# Patient Record
Sex: Male | Born: 1970 | Race: White | Hispanic: No | Marital: Single | Smoking: Current every day smoker
Health system: Southern US, Community
[De-identification: ages and names within clinical notes are randomized; demographics above are authoritative.]

## PROBLEM LIST (undated history)

## (undated) VITALS — BP 148/87 | HR 89 | Temp 97.8°F | Resp 16 | Ht 68.75 in | Wt 184.0 lb

## (undated) DIAGNOSIS — F1410 Cocaine abuse, uncomplicated: Secondary | ICD-10-CM

## (undated) DIAGNOSIS — I219 Acute myocardial infarction, unspecified: Secondary | ICD-10-CM

## (undated) DIAGNOSIS — I2510 Atherosclerotic heart disease of native coronary artery without angina pectoris: Secondary | ICD-10-CM

## (undated) DIAGNOSIS — I10 Essential (primary) hypertension: Secondary | ICD-10-CM

## (undated) DIAGNOSIS — F1010 Alcohol abuse, uncomplicated: Secondary | ICD-10-CM

## (undated) DIAGNOSIS — Z951 Presence of aortocoronary bypass graft: Secondary | ICD-10-CM

## (undated) DIAGNOSIS — I639 Cerebral infarction, unspecified: Secondary | ICD-10-CM

## (undated) DIAGNOSIS — F1910 Other psychoactive substance abuse, uncomplicated: Secondary | ICD-10-CM

## (undated) DIAGNOSIS — J449 Chronic obstructive pulmonary disease, unspecified: Secondary | ICD-10-CM

## (undated) DIAGNOSIS — F17200 Nicotine dependence, unspecified, uncomplicated: Secondary | ICD-10-CM

## (undated) DIAGNOSIS — J45909 Unspecified asthma, uncomplicated: Secondary | ICD-10-CM

## (undated) DIAGNOSIS — F319 Bipolar disorder, unspecified: Secondary | ICD-10-CM

## (undated) DIAGNOSIS — N200 Calculus of kidney: Secondary | ICD-10-CM

## (undated) DIAGNOSIS — R569 Unspecified convulsions: Secondary | ICD-10-CM

## (undated) DIAGNOSIS — E7800 Pure hypercholesterolemia, unspecified: Secondary | ICD-10-CM

## (undated) HISTORY — PX: APPENDECTOMY: SHX54

## (undated) HISTORY — PX: CORONARY ARTERY BYPASS GRAFT: SHX141

## (undated) HISTORY — PX: PELVIC FRACTURE SURGERY: SHX119

## (undated) HISTORY — PX: CARDIAC SURGERY: SHX584

## (undated) HISTORY — PX: KIDNEY STONE SURGERY: SHX686

---

## 2000-09-12 ENCOUNTER — Inpatient Hospital Stay (HOSPITAL_COMMUNITY): Admission: EM | Admit: 2000-09-12 | Discharge: 2000-09-17 | Payer: Self-pay | Admitting: *Deleted

## 2000-10-17 ENCOUNTER — Encounter: Payer: Self-pay | Admitting: Emergency Medicine

## 2000-10-17 ENCOUNTER — Emergency Department (HOSPITAL_COMMUNITY): Admission: EM | Admit: 2000-10-17 | Discharge: 2000-10-17 | Payer: Self-pay | Admitting: Emergency Medicine

## 2000-10-21 ENCOUNTER — Encounter: Payer: Self-pay | Admitting: Emergency Medicine

## 2000-10-21 ENCOUNTER — Ambulatory Visit (HOSPITAL_COMMUNITY): Admission: EM | Admit: 2000-10-21 | Discharge: 2000-10-21 | Payer: Self-pay | Admitting: *Deleted

## 2000-10-21 ENCOUNTER — Encounter: Payer: Self-pay | Admitting: Urology

## 2000-10-23 ENCOUNTER — Emergency Department (HOSPITAL_COMMUNITY): Admission: EM | Admit: 2000-10-23 | Discharge: 2000-10-24 | Payer: Self-pay | Admitting: Emergency Medicine

## 2000-10-24 ENCOUNTER — Encounter: Payer: Self-pay | Admitting: Emergency Medicine

## 2000-11-01 ENCOUNTER — Ambulatory Visit (HOSPITAL_COMMUNITY): Admission: RE | Admit: 2000-11-01 | Discharge: 2000-11-01 | Payer: Self-pay | Admitting: Orthopedic Surgery

## 2000-11-01 ENCOUNTER — Encounter: Payer: Self-pay | Admitting: Orthopedic Surgery

## 2000-11-05 ENCOUNTER — Emergency Department (HOSPITAL_COMMUNITY): Admission: EM | Admit: 2000-11-05 | Discharge: 2000-11-06 | Payer: Self-pay | Admitting: Emergency Medicine

## 2000-11-06 ENCOUNTER — Encounter: Payer: Self-pay | Admitting: Emergency Medicine

## 2000-12-04 ENCOUNTER — Emergency Department (HOSPITAL_COMMUNITY): Admission: EM | Admit: 2000-12-04 | Discharge: 2000-12-04 | Payer: Self-pay | Admitting: Emergency Medicine

## 2000-12-08 ENCOUNTER — Encounter: Payer: Self-pay | Admitting: Emergency Medicine

## 2000-12-08 ENCOUNTER — Emergency Department (HOSPITAL_COMMUNITY): Admission: EM | Admit: 2000-12-08 | Discharge: 2000-12-08 | Payer: Self-pay | Admitting: Emergency Medicine

## 2000-12-12 ENCOUNTER — Emergency Department (HOSPITAL_COMMUNITY): Admission: EM | Admit: 2000-12-12 | Discharge: 2000-12-13 | Payer: Self-pay | Admitting: Emergency Medicine

## 2000-12-13 ENCOUNTER — Emergency Department (HOSPITAL_COMMUNITY): Admission: EM | Admit: 2000-12-13 | Discharge: 2000-12-13 | Payer: Self-pay

## 2001-01-31 ENCOUNTER — Encounter: Payer: Self-pay | Admitting: Emergency Medicine

## 2001-01-31 ENCOUNTER — Emergency Department (HOSPITAL_COMMUNITY): Admission: EM | Admit: 2001-01-31 | Discharge: 2001-01-31 | Payer: Self-pay | Admitting: Emergency Medicine

## 2001-02-15 ENCOUNTER — Inpatient Hospital Stay (HOSPITAL_COMMUNITY): Admission: EM | Admit: 2001-02-15 | Discharge: 2001-02-20 | Payer: Self-pay | Admitting: Psychiatry

## 2001-03-22 ENCOUNTER — Emergency Department (HOSPITAL_COMMUNITY): Admission: EM | Admit: 2001-03-22 | Discharge: 2001-03-22 | Payer: Self-pay | Admitting: Emergency Medicine

## 2001-03-23 ENCOUNTER — Encounter: Payer: Self-pay | Admitting: Emergency Medicine

## 2001-04-20 ENCOUNTER — Inpatient Hospital Stay (HOSPITAL_COMMUNITY): Admission: EM | Admit: 2001-04-20 | Discharge: 2001-04-29 | Payer: Self-pay | Admitting: *Deleted

## 2001-05-12 ENCOUNTER — Emergency Department (HOSPITAL_COMMUNITY): Admission: EM | Admit: 2001-05-12 | Discharge: 2001-05-12 | Payer: Self-pay

## 2001-07-07 ENCOUNTER — Encounter: Payer: Self-pay | Admitting: Emergency Medicine

## 2001-07-07 ENCOUNTER — Emergency Department (HOSPITAL_COMMUNITY): Admission: EM | Admit: 2001-07-07 | Discharge: 2001-07-07 | Payer: Self-pay

## 2001-09-18 ENCOUNTER — Emergency Department (HOSPITAL_COMMUNITY): Admission: EM | Admit: 2001-09-18 | Discharge: 2001-09-18 | Payer: Self-pay | Admitting: Emergency Medicine

## 2001-11-09 ENCOUNTER — Emergency Department (HOSPITAL_COMMUNITY): Admission: EM | Admit: 2001-11-09 | Discharge: 2001-11-09 | Payer: Self-pay | Admitting: Emergency Medicine

## 2002-03-23 ENCOUNTER — Emergency Department (HOSPITAL_COMMUNITY): Admission: EM | Admit: 2002-03-23 | Discharge: 2002-03-23 | Payer: Self-pay | Admitting: Emergency Medicine

## 2002-03-23 ENCOUNTER — Encounter: Payer: Self-pay | Admitting: Emergency Medicine

## 2002-04-05 ENCOUNTER — Emergency Department (HOSPITAL_COMMUNITY): Admission: EM | Admit: 2002-04-05 | Discharge: 2002-04-05 | Payer: Self-pay | Admitting: Emergency Medicine

## 2002-04-05 ENCOUNTER — Encounter: Payer: Self-pay | Admitting: Emergency Medicine

## 2002-04-07 ENCOUNTER — Encounter: Payer: Self-pay | Admitting: *Deleted

## 2002-04-07 ENCOUNTER — Emergency Department (HOSPITAL_COMMUNITY): Admission: EM | Admit: 2002-04-07 | Discharge: 2002-04-07 | Payer: Self-pay | Admitting: *Deleted

## 2002-06-08 ENCOUNTER — Emergency Department (HOSPITAL_COMMUNITY): Admission: EM | Admit: 2002-06-08 | Discharge: 2002-06-09 | Payer: Self-pay | Admitting: Emergency Medicine

## 2002-06-15 ENCOUNTER — Emergency Department (HOSPITAL_COMMUNITY): Admission: EM | Admit: 2002-06-15 | Discharge: 2002-06-15 | Payer: Self-pay | Admitting: Emergency Medicine

## 2002-06-15 ENCOUNTER — Encounter: Payer: Self-pay | Admitting: Emergency Medicine

## 2002-06-22 ENCOUNTER — Emergency Department (HOSPITAL_COMMUNITY): Admission: EM | Admit: 2002-06-22 | Discharge: 2002-06-22 | Payer: Self-pay | Admitting: Emergency Medicine

## 2002-06-22 ENCOUNTER — Encounter: Payer: Self-pay | Admitting: Emergency Medicine

## 2002-07-28 ENCOUNTER — Emergency Department (HOSPITAL_COMMUNITY): Admission: EM | Admit: 2002-07-28 | Discharge: 2002-07-28 | Payer: Self-pay | Admitting: Emergency Medicine

## 2003-10-15 ENCOUNTER — Emergency Department (HOSPITAL_COMMUNITY): Admission: EM | Admit: 2003-10-15 | Discharge: 2003-10-15 | Payer: Self-pay | Admitting: Emergency Medicine

## 2003-12-22 ENCOUNTER — Inpatient Hospital Stay (HOSPITAL_COMMUNITY): Admission: EM | Admit: 2003-12-22 | Discharge: 2003-12-24 | Payer: Self-pay | Admitting: Emergency Medicine

## 2004-02-13 ENCOUNTER — Emergency Department (HOSPITAL_COMMUNITY): Admission: EM | Admit: 2004-02-13 | Discharge: 2004-02-13 | Payer: Self-pay

## 2004-02-29 ENCOUNTER — Emergency Department (HOSPITAL_COMMUNITY): Admission: EM | Admit: 2004-02-29 | Discharge: 2004-02-29 | Payer: Self-pay | Admitting: Emergency Medicine

## 2004-04-19 ENCOUNTER — Emergency Department (HOSPITAL_COMMUNITY): Admission: EM | Admit: 2004-04-19 | Discharge: 2004-04-19 | Payer: Self-pay | Admitting: Emergency Medicine

## 2004-04-20 ENCOUNTER — Emergency Department (HOSPITAL_COMMUNITY): Admission: EM | Admit: 2004-04-20 | Discharge: 2004-04-20 | Payer: Self-pay | Admitting: Emergency Medicine

## 2004-05-13 ENCOUNTER — Emergency Department (HOSPITAL_COMMUNITY): Admission: EM | Admit: 2004-05-13 | Discharge: 2004-05-13 | Payer: Self-pay | Admitting: Emergency Medicine

## 2004-06-22 ENCOUNTER — Emergency Department (HOSPITAL_COMMUNITY): Admission: EM | Admit: 2004-06-22 | Discharge: 2004-06-22 | Payer: Self-pay | Admitting: Emergency Medicine

## 2004-07-06 ENCOUNTER — Encounter: Admission: RE | Admit: 2004-07-06 | Discharge: 2004-10-04 | Payer: Self-pay | Admitting: Family Medicine

## 2004-11-16 ENCOUNTER — Emergency Department (HOSPITAL_COMMUNITY): Admission: EM | Admit: 2004-11-16 | Discharge: 2004-11-16 | Payer: Self-pay | Admitting: Emergency Medicine

## 2004-11-17 ENCOUNTER — Ambulatory Visit: Payer: Self-pay | Admitting: Nurse Practitioner

## 2004-11-18 ENCOUNTER — Ambulatory Visit: Payer: Self-pay | Admitting: *Deleted

## 2005-04-24 ENCOUNTER — Emergency Department (HOSPITAL_COMMUNITY): Admission: EM | Admit: 2005-04-24 | Discharge: 2005-04-24 | Payer: Self-pay | Admitting: Emergency Medicine

## 2006-03-28 ENCOUNTER — Ambulatory Visit: Payer: Self-pay | Admitting: Nurse Practitioner

## 2006-03-29 ENCOUNTER — Ambulatory Visit (HOSPITAL_COMMUNITY): Admission: RE | Admit: 2006-03-29 | Discharge: 2006-03-29 | Payer: Self-pay | Admitting: Nurse Practitioner

## 2006-03-31 ENCOUNTER — Emergency Department (HOSPITAL_COMMUNITY): Admission: EM | Admit: 2006-03-31 | Discharge: 2006-03-31 | Payer: Self-pay | Admitting: Emergency Medicine

## 2006-04-11 ENCOUNTER — Ambulatory Visit: Payer: Self-pay | Admitting: Nurse Practitioner

## 2006-04-23 ENCOUNTER — Ambulatory Visit: Payer: Self-pay | Admitting: Nurse Practitioner

## 2006-04-26 ENCOUNTER — Ambulatory Visit: Payer: Self-pay | Admitting: Nurse Practitioner

## 2006-06-11 ENCOUNTER — Emergency Department (HOSPITAL_COMMUNITY): Admission: EM | Admit: 2006-06-11 | Discharge: 2006-06-11 | Payer: Self-pay | Admitting: Emergency Medicine

## 2006-06-20 ENCOUNTER — Ambulatory Visit: Payer: Self-pay | Admitting: Nurse Practitioner

## 2006-07-04 ENCOUNTER — Emergency Department (HOSPITAL_COMMUNITY): Admission: EM | Admit: 2006-07-04 | Discharge: 2006-07-04 | Payer: Self-pay | Admitting: Emergency Medicine

## 2006-07-06 ENCOUNTER — Ambulatory Visit: Payer: Self-pay | Admitting: Nurse Practitioner

## 2006-07-25 ENCOUNTER — Ambulatory Visit: Payer: Self-pay | Admitting: Nurse Practitioner

## 2006-08-28 ENCOUNTER — Emergency Department (HOSPITAL_COMMUNITY): Admission: EM | Admit: 2006-08-28 | Discharge: 2006-08-28 | Payer: Self-pay | Admitting: Emergency Medicine

## 2006-09-17 ENCOUNTER — Emergency Department (HOSPITAL_COMMUNITY): Admission: EM | Admit: 2006-09-17 | Discharge: 2006-09-17 | Payer: Self-pay | Admitting: Emergency Medicine

## 2006-09-17 ENCOUNTER — Ambulatory Visit: Payer: Self-pay | Admitting: Nurse Practitioner

## 2006-10-10 ENCOUNTER — Ambulatory Visit: Payer: Self-pay | Admitting: Nurse Practitioner

## 2006-10-19 ENCOUNTER — Ambulatory Visit (HOSPITAL_COMMUNITY): Admission: RE | Admit: 2006-10-19 | Discharge: 2006-10-19 | Payer: Self-pay | Admitting: Nurse Practitioner

## 2006-11-06 ENCOUNTER — Ambulatory Visit: Payer: Self-pay | Admitting: Internal Medicine

## 2007-01-30 ENCOUNTER — Encounter (INDEPENDENT_AMBULATORY_CARE_PROVIDER_SITE_OTHER): Payer: Self-pay | Admitting: *Deleted

## 2007-03-13 DIAGNOSIS — E785 Hyperlipidemia, unspecified: Secondary | ICD-10-CM

## 2007-03-13 DIAGNOSIS — I10 Essential (primary) hypertension: Secondary | ICD-10-CM | POA: Insufficient documentation

## 2007-03-13 DIAGNOSIS — K219 Gastro-esophageal reflux disease without esophagitis: Secondary | ICD-10-CM | POA: Insufficient documentation

## 2008-06-04 ENCOUNTER — Emergency Department (HOSPITAL_COMMUNITY): Admission: EM | Admit: 2008-06-04 | Discharge: 2008-06-05 | Payer: Self-pay | Admitting: Emergency Medicine

## 2008-06-05 ENCOUNTER — Ambulatory Visit: Payer: Self-pay | Admitting: *Deleted

## 2008-06-05 ENCOUNTER — Inpatient Hospital Stay (HOSPITAL_COMMUNITY): Admission: EM | Admit: 2008-06-05 | Discharge: 2008-06-10 | Payer: Self-pay | Admitting: *Deleted

## 2008-06-16 ENCOUNTER — Emergency Department (HOSPITAL_COMMUNITY): Admission: EM | Admit: 2008-06-16 | Discharge: 2008-06-16 | Payer: Self-pay | Admitting: Emergency Medicine

## 2008-06-18 ENCOUNTER — Emergency Department (HOSPITAL_COMMUNITY): Admission: EM | Admit: 2008-06-18 | Discharge: 2008-06-18 | Payer: Self-pay | Admitting: Emergency Medicine

## 2008-07-29 ENCOUNTER — Emergency Department (HOSPITAL_COMMUNITY): Admission: EM | Admit: 2008-07-29 | Discharge: 2008-07-30 | Payer: Self-pay | Admitting: Emergency Medicine

## 2008-08-10 ENCOUNTER — Emergency Department (HOSPITAL_COMMUNITY): Admission: EM | Admit: 2008-08-10 | Discharge: 2008-08-11 | Payer: Self-pay | Admitting: Emergency Medicine

## 2008-09-23 ENCOUNTER — Emergency Department (HOSPITAL_COMMUNITY): Admission: EM | Admit: 2008-09-23 | Discharge: 2008-09-23 | Payer: Self-pay | Admitting: Emergency Medicine

## 2009-01-21 ENCOUNTER — Emergency Department (HOSPITAL_COMMUNITY): Admission: EM | Admit: 2009-01-21 | Discharge: 2009-01-21 | Payer: Self-pay | Admitting: Emergency Medicine

## 2009-01-21 ENCOUNTER — Inpatient Hospital Stay (HOSPITAL_COMMUNITY): Admission: AD | Admit: 2009-01-21 | Discharge: 2009-01-28 | Payer: Self-pay | Admitting: Psychiatry

## 2009-01-21 ENCOUNTER — Ambulatory Visit: Payer: Self-pay | Admitting: Psychiatry

## 2009-01-30 ENCOUNTER — Emergency Department (HOSPITAL_COMMUNITY): Admission: EM | Admit: 2009-01-30 | Discharge: 2009-01-31 | Payer: Self-pay | Admitting: Emergency Medicine

## 2009-04-29 ENCOUNTER — Emergency Department (HOSPITAL_COMMUNITY): Admission: EM | Admit: 2009-04-29 | Discharge: 2009-04-29 | Payer: Self-pay | Admitting: Emergency Medicine

## 2009-06-02 ENCOUNTER — Ambulatory Visit (HOSPITAL_BASED_OUTPATIENT_CLINIC_OR_DEPARTMENT_OTHER): Admission: RE | Admit: 2009-06-02 | Discharge: 2009-06-02 | Payer: Self-pay | Admitting: Plastic Surgery

## 2009-09-14 ENCOUNTER — Emergency Department (HOSPITAL_COMMUNITY): Admission: EM | Admit: 2009-09-14 | Discharge: 2009-09-15 | Payer: Self-pay | Admitting: Emergency Medicine

## 2009-10-04 ENCOUNTER — Ambulatory Visit: Payer: Self-pay | Admitting: Cardiothoracic Surgery

## 2009-10-08 ENCOUNTER — Emergency Department: Payer: Self-pay | Admitting: Emergency Medicine

## 2009-10-15 ENCOUNTER — Ambulatory Visit: Payer: Self-pay | Admitting: Thoracic Surgery (Cardiothoracic Vascular Surgery)

## 2009-11-18 ENCOUNTER — Encounter (INDEPENDENT_AMBULATORY_CARE_PROVIDER_SITE_OTHER): Payer: Self-pay | Admitting: Adult Health

## 2009-11-18 ENCOUNTER — Ambulatory Visit: Payer: Self-pay | Admitting: Internal Medicine

## 2009-11-18 LAB — CONVERTED CEMR LAB
Amphetamine Screen, Ur: NEGATIVE
Barbiturate Quant, Ur: NEGATIVE
Cocaine Metabolites: POSITIVE — AB
Opiate Screen, Urine: POSITIVE — AB
Phencyclidine (PCP): NEGATIVE

## 2009-11-21 ENCOUNTER — Emergency Department (HOSPITAL_COMMUNITY): Admission: EM | Admit: 2009-11-21 | Discharge: 2009-11-22 | Payer: Self-pay | Admitting: Emergency Medicine

## 2009-11-23 ENCOUNTER — Ambulatory Visit: Payer: Self-pay | Admitting: Internal Medicine

## 2009-11-23 ENCOUNTER — Encounter (INDEPENDENT_AMBULATORY_CARE_PROVIDER_SITE_OTHER): Payer: Self-pay | Admitting: Adult Health

## 2009-11-23 LAB — CONVERTED CEMR LAB
Alkaline Phosphatase: 105 units/L (ref 39–117)
Basophils Absolute: 0 10*3/uL (ref 0.0–0.1)
CO2: 21 meq/L (ref 19–32)
Cholesterol: 169 mg/dL (ref 0–200)
Creatinine, Ser: 0.93 mg/dL (ref 0.40–1.50)
Eosinophils Absolute: 0.2 10*3/uL (ref 0.0–0.7)
Eosinophils Relative: 3 % (ref 0–5)
Glucose, Bld: 106 mg/dL — ABNORMAL HIGH (ref 70–99)
HCT: 43.4 % (ref 39.0–52.0)
Hemoglobin: 13.8 g/dL (ref 13.0–17.0)
LDL Cholesterol: 77 mg/dL (ref 0–99)
Lymphocytes Relative: 19 % (ref 12–46)
Lymphs Abs: 1.5 10*3/uL (ref 0.7–4.0)
MCV: 96.9 fL (ref 78.0–100.0)
Monocytes Absolute: 0.5 10*3/uL (ref 0.1–1.0)
Platelets: 489 10*3/uL — ABNORMAL HIGH (ref 150–400)
RDW: 15.8 % — ABNORMAL HIGH (ref 11.5–15.5)
Sodium: 138 meq/L (ref 135–145)
Total Bilirubin: 0.5 mg/dL (ref 0.3–1.2)
Total CHOL/HDL Ratio: 4.8
Total Protein: 7.3 g/dL (ref 6.0–8.3)
Triglycerides: 285 mg/dL — ABNORMAL HIGH (ref <150)
VLDL: 57 mg/dL — ABNORMAL HIGH (ref 0–40)

## 2009-11-25 ENCOUNTER — Encounter (INDEPENDENT_AMBULATORY_CARE_PROVIDER_SITE_OTHER): Payer: Self-pay | Admitting: Adult Health

## 2009-12-09 ENCOUNTER — Encounter (INDEPENDENT_AMBULATORY_CARE_PROVIDER_SITE_OTHER): Payer: Self-pay | Admitting: Adult Health

## 2009-12-09 ENCOUNTER — Ambulatory Visit: Payer: Self-pay | Admitting: Internal Medicine

## 2009-12-09 LAB — CONVERTED CEMR LAB

## 2009-12-13 ENCOUNTER — Ambulatory Visit: Payer: Self-pay | Admitting: Internal Medicine

## 2010-03-07 ENCOUNTER — Inpatient Hospital Stay (HOSPITAL_COMMUNITY): Admission: EM | Admit: 2010-03-07 | Discharge: 2010-03-07 | Payer: Self-pay | Admitting: Emergency Medicine

## 2010-07-27 LAB — COMPREHENSIVE METABOLIC PANEL
ALT: 27 U/L (ref 0–53)
AST: 27 U/L (ref 0–37)
Alkaline Phosphatase: 76 U/L (ref 39–117)
CO2: 24 mEq/L (ref 19–32)
Calcium: 9.1 mg/dL (ref 8.4–10.5)
Chloride: 109 mEq/L (ref 96–112)
GFR calc non Af Amer: >60 mL/min (ref >60)
Glucose, Bld: 113 mg/dL — ABNORMAL HIGH (ref 70–99)
Sodium: 139 mEq/L (ref 135–145)
Total Bilirubin: 0.4 mg/dL (ref 0.3–1.2)

## 2010-07-27 LAB — POCT I-STAT, CHEM 8
BUN: 17 mg/dL (ref 6–23)
Creatinine, Ser: 1.3 mg/dL (ref 0.4–1.5)
Glucose, Bld: 131 mg/dL — ABNORMAL HIGH (ref 70–99)
Hemoglobin: 15.3 g/dL (ref 13.0–17.0)
Potassium: 3.5 mEq/L (ref 3.5–5.1)
TCO2: 26 mmol/L (ref 0–100)

## 2010-07-27 LAB — LIPID PANEL
Cholesterol: 146 mg/dL (ref 0–200)
HDL: 32 mg/dL — ABNORMAL LOW (ref >39)

## 2010-07-27 LAB — D-DIMER, QUANTITATIVE: D-Dimer, Quant: 0.3 ug/mL-FEU (ref 0.00–0.48)

## 2010-07-27 LAB — CK TOTAL AND CKMB (NOT AT ARMC): Relative Index: INVALID (ref 0.0–2.5)

## 2010-07-27 LAB — DIFFERENTIAL
Basophils Relative: 0 % (ref 0–1)
Eosinophils Absolute: 0.6 10*3/uL (ref 0.0–0.7)
Monocytes Absolute: 0.9 10*3/uL (ref 0.1–1.0)
Monocytes Relative: 9 % (ref 3–12)

## 2010-07-27 LAB — LIPASE, BLOOD: Lipase: 51 U/L (ref 11–59)

## 2010-07-27 LAB — RAPID URINE DRUG SCREEN, HOSP PERFORMED
Amphetamines: NOT DETECTED
Cocaine: POSITIVE — AB
Tetrahydrocannabinol: POSITIVE — AB

## 2010-07-27 LAB — CBC
HCT: 42.6 % (ref 39.0–52.0)
Hemoglobin: 14.4 g/dL (ref 13.0–17.0)
MCHC: 33.8 g/dL (ref 30.0–36.0)
MCV: 93.2 fL (ref 78.0–100.0)
Platelets: 283 10*3/uL (ref 150–400)
RBC: 4.57 MIL/uL (ref 4.22–5.81)
RDW: 15.2 % (ref 11.5–15.5)
WBC: 9.5 10*3/uL (ref 4.0–10.5)

## 2010-07-31 LAB — POCT CARDIAC MARKERS
CKMB, poc: 3.4 ng/mL (ref 1.0–8.0)
CKMB, poc: 4.5 ng/mL (ref 1.0–8.0)
Myoglobin, poc: 82.6 ng/mL (ref 12–200)
Troponin i, poc: <0.05 ng/mL (ref 0.00–0.09)

## 2010-07-31 LAB — POCT I-STAT, CHEM 8
Calcium, Ion: 1.1 mmol/L — ABNORMAL LOW (ref 1.12–1.32)
Creatinine, Ser: 1.2 mg/dL (ref 0.4–1.5)
Glucose, Bld: 84 mg/dL (ref 70–99)
HCT: 42 % (ref 39.0–52.0)
Hemoglobin: 14.3 g/dL (ref 13.0–17.0)
Potassium: 3.4 mEq/L — ABNORMAL LOW (ref 3.5–5.1)

## 2010-07-31 LAB — CBC
HCT: 40.6 % (ref 39.0–52.0)
Hemoglobin: 13.5 g/dL (ref 13.0–17.0)
MCH: 31.4 pg (ref 26.0–34.0)
MCHC: 33.3 g/dL (ref 30.0–36.0)
MCV: 94.2 fL (ref 78.0–100.0)
RBC: 4.3 MIL/uL (ref 4.22–5.81)

## 2010-07-31 LAB — DIFFERENTIAL
Basophils Relative: 1 % (ref 0–1)
Eosinophils Absolute: 0.2 10*3/uL (ref 0.0–0.7)
Eosinophils Relative: 2 % (ref 0–5)
Lymphs Abs: 3.7 10*3/uL (ref 0.7–4.0)
Monocytes Absolute: 0.4 10*3/uL (ref 0.1–1.0)
Monocytes Relative: 5 % (ref 3–12)

## 2010-08-02 LAB — URINALYSIS, ROUTINE W REFLEX MICROSCOPIC
Glucose, UA: NEGATIVE mg/dL
Hgb urine dipstick: NEGATIVE
Specific Gravity, Urine: 1.005 (ref 1.005–1.030)
pH: 6 (ref 5.0–8.0)

## 2010-08-02 LAB — CBC
HCT: 46.7 % (ref 39.0–52.0)
MCHC: 34.1 g/dL (ref 30.0–36.0)
MCV: 97 fL (ref 78.0–100.0)
Platelets: 333 10*3/uL (ref 150–400)
RDW: 14 % (ref 11.5–15.5)
WBC: 7.7 10*3/uL (ref 4.0–10.5)

## 2010-08-02 LAB — POCT I-STAT, CHEM 8
BUN: 10 mg/dL (ref 6–23)
Calcium, Ion: 1.04 mmol/L — ABNORMAL LOW (ref 1.12–1.32)
Chloride: 108 mEq/L (ref 96–112)
Glucose, Bld: 111 mg/dL — ABNORMAL HIGH (ref 70–99)
HCT: 50 % (ref 39.0–52.0)

## 2010-08-02 LAB — RAPID URINE DRUG SCREEN, HOSP PERFORMED
Amphetamines: NOT DETECTED
Barbiturates: NOT DETECTED
Benzodiazepines: NOT DETECTED
Cocaine: POSITIVE — AB
Opiates: NOT DETECTED

## 2010-08-02 LAB — HEPATIC FUNCTION PANEL
Alkaline Phosphatase: 101 U/L (ref 39–117)
Indirect Bilirubin: 0.5 mg/dL (ref 0.3–0.9)
Total Protein: 8 g/dL (ref 6.0–8.3)

## 2010-08-02 LAB — POCT CARDIAC MARKERS: CKMB, poc: 4.4 ng/mL (ref 1.0–8.0)

## 2010-08-02 LAB — DIFFERENTIAL
Basophils Relative: 1 % (ref 0–1)
Eosinophils Absolute: 0.1 10*3/uL (ref 0.0–0.7)
Eosinophils Relative: 1 % (ref 0–5)
Lymphs Abs: 3 10*3/uL (ref 0.7–4.0)

## 2010-08-02 LAB — LIPASE, BLOOD: Lipase: 31 U/L (ref 11–59)

## 2010-08-19 LAB — RAPID URINE DRUG SCREEN, HOSP PERFORMED
Barbiturates: NOT DETECTED
Benzodiazepines: POSITIVE — AB
Tetrahydrocannabinol: POSITIVE — AB

## 2010-08-19 LAB — URINALYSIS, ROUTINE W REFLEX MICROSCOPIC
Glucose, UA: NEGATIVE mg/dL
Protein, ur: NEGATIVE mg/dL
pH: 5.5 (ref 5.0–8.0)

## 2010-08-19 LAB — COMPREHENSIVE METABOLIC PANEL
BUN: 21 mg/dL (ref 6–23)
Calcium: 9.6 mg/dL (ref 8.4–10.5)
Creatinine, Ser: 1.77 mg/dL — ABNORMAL HIGH (ref 0.4–1.5)
Glucose, Bld: 115 mg/dL — ABNORMAL HIGH (ref 70–99)
Total Protein: 7.9 g/dL (ref 6.0–8.3)

## 2010-08-19 LAB — CBC
HCT: 43.5 % (ref 39.0–52.0)
HCT: 47.9 % (ref 39.0–52.0)
Hemoglobin: 15.1 g/dL (ref 13.0–17.0)
Hemoglobin: 16.6 g/dL (ref 13.0–17.0)
MCHC: 34.7 g/dL (ref 30.0–36.0)
MCHC: 34.6 g/dL (ref 30.0–36.0)
MCV: 99 fL (ref 78.0–100.0)
RBC: 4.84 MIL/uL (ref 4.22–5.81)
RDW: 13.9 % (ref 11.5–15.5)

## 2010-08-19 LAB — DIFFERENTIAL
Basophils Relative: 1 % (ref 0–1)
Basophils Relative: 1 % (ref 0–1)
Eosinophils Absolute: 0.1 10*3/uL (ref 0.0–0.7)
Eosinophils Relative: 1 % (ref 0–5)
Lymphs Abs: 2 10*3/uL (ref 0.7–4.0)
Monocytes Absolute: 0.5 10*3/uL (ref 0.1–1.0)
Monocytes Relative: 11 % (ref 3–12)
Monocytes Relative: 5 % (ref 3–12)
Neutro Abs: 7.2 10*3/uL (ref 1.7–7.7)
Neutrophils Relative %: 68 % (ref 43–77)
Neutrophils Relative %: 76 % (ref 43–77)

## 2010-08-19 LAB — BASIC METABOLIC PANEL
CO2: 22 mEq/L (ref 19–32)
Chloride: 101 mEq/L (ref 96–112)
Creatinine, Ser: 1 mg/dL (ref 0.4–1.5)
GFR calc Af Amer: >60 mL/min (ref >60)

## 2010-08-23 LAB — CBC
HCT: 44 % (ref 39.0–52.0)
Hemoglobin: 15.4 g/dL (ref 13.0–17.0)
Platelets: 282 10*3/uL (ref 150–400)
WBC: 9.4 10*3/uL (ref 4.0–10.5)

## 2010-08-23 LAB — URINALYSIS, ROUTINE W REFLEX MICROSCOPIC
Nitrite: NEGATIVE
Specific Gravity, Urine: 1.002 — ABNORMAL LOW (ref 1.005–1.030)
Urobilinogen, UA: 0.2 mg/dL (ref 0.0–1.0)
pH: 6.5 (ref 5.0–8.0)

## 2010-08-23 LAB — DIFFERENTIAL
Basophils Absolute: 0.1 10*3/uL (ref 0.0–0.1)
Eosinophils Relative: 1 % (ref 0–5)
Lymphocytes Relative: 24 % (ref 12–46)
Lymphs Abs: 2.3 10*3/uL (ref 0.7–4.0)
Neutro Abs: 6.3 10*3/uL (ref 1.7–7.7)

## 2010-08-23 LAB — BASIC METABOLIC PANEL
BUN: 8 mg/dL (ref 6–23)
Calcium: 9.9 mg/dL (ref 8.4–10.5)
GFR calc non Af Amer: >60 mL/min (ref >60)
Potassium: 3.2 mEq/L — ABNORMAL LOW (ref 3.5–5.1)
Sodium: 140 mEq/L (ref 135–145)

## 2010-08-25 LAB — POCT I-STAT, CHEM 8
BUN: 9 mg/dL (ref 6–23)
Chloride: 103 mEq/L (ref 96–112)
Chloride: 107 mEq/L (ref 96–112)
Creatinine, Ser: 1.2 mg/dL (ref 0.4–1.5)
Glucose, Bld: 105 mg/dL — ABNORMAL HIGH (ref 70–99)
HCT: 47 % (ref 39.0–52.0)
Hemoglobin: 16 g/dL (ref 13.0–17.0)
Potassium: 3.2 mEq/L — ABNORMAL LOW (ref 3.5–5.1)
Potassium: 3.7 mEq/L (ref 3.5–5.1)
Sodium: 137 mEq/L (ref 135–145)
Sodium: 142 mEq/L (ref 135–145)
TCO2: 23 mmol/L (ref 0–100)

## 2010-08-25 LAB — CBC
MCHC: 35 g/dL (ref 30.0–36.0)
MCV: 96.2 fL (ref 78.0–100.0)
Platelets: 234 10*3/uL (ref 150–400)
RBC: 4.71 MIL/uL (ref 4.22–5.81)
WBC: 6.1 10*3/uL (ref 4.0–10.5)

## 2010-08-25 LAB — URINALYSIS, ROUTINE W REFLEX MICROSCOPIC
Ketones, ur: NEGATIVE mg/dL
Nitrite: NEGATIVE
Nitrite: NEGATIVE
Protein, ur: NEGATIVE mg/dL
Protein, ur: NEGATIVE mg/dL
Specific Gravity, Urine: 1.007 (ref 1.005–1.030)
Urobilinogen, UA: 0.2 mg/dL (ref 0.0–1.0)
Urobilinogen, UA: 0.2 mg/dL (ref 0.0–1.0)

## 2010-08-25 LAB — DIFFERENTIAL
Basophils Relative: 2 % — ABNORMAL HIGH (ref 0–1)
Eosinophils Absolute: 0 10*3/uL (ref 0.0–0.7)
Lymphs Abs: 2.5 10*3/uL (ref 0.7–4.0)
Monocytes Relative: 7 % (ref 3–12)
Neutro Abs: 3 10*3/uL (ref 1.7–7.7)
Neutrophils Relative %: 50 % (ref 43–77)

## 2010-08-25 LAB — LIPASE, BLOOD: Lipase: 58 U/L (ref 11–59)

## 2010-08-25 LAB — RAPID URINE DRUG SCREEN, HOSP PERFORMED: Barbiturates: NOT DETECTED

## 2010-08-25 LAB — ETHANOL: Alcohol, Ethyl (B): 200 mg/dL — ABNORMAL HIGH (ref 0–10)

## 2010-08-29 LAB — DIFFERENTIAL
Eosinophils Absolute: 0.3 10*3/uL (ref 0.0–0.7)
Eosinophils Relative: 2 % (ref 0–5)
Lymphocytes Relative: 35 % (ref 12–46)
Lymphs Abs: 1.4 10*3/uL (ref 0.7–4.0)
Lymphs Abs: 3.2 10*3/uL (ref 0.7–4.0)
Monocytes Absolute: 0.5 10*3/uL (ref 0.1–1.0)
Monocytes Absolute: 0.8 10*3/uL (ref 0.1–1.0)
Monocytes Relative: 5 % (ref 3–12)
Monocytes Relative: 7 % (ref 3–12)
Neutrophils Relative %: 78 % — ABNORMAL HIGH (ref 43–77)

## 2010-08-29 LAB — CBC
HCT: 37.6 % — ABNORMAL LOW (ref 39.0–52.0)
Hemoglobin: 12.9 g/dL — ABNORMAL LOW (ref 13.0–17.0)
MCHC: 34.3 g/dL (ref 30.0–36.0)
MCHC: 34.3 g/dL (ref 30.0–36.0)
MCV: 98 fL (ref 78.0–100.0)
MCV: 98.6 fL (ref 78.0–100.0)
Platelets: 319 10*3/uL (ref 150–400)
RBC: 3.81 MIL/uL — ABNORMAL LOW (ref 4.22–5.81)
WBC: 11.5 10*3/uL — ABNORMAL HIGH (ref 4.0–10.5)
WBC: 9 10*3/uL (ref 4.0–10.5)

## 2010-08-29 LAB — RAPID URINE DRUG SCREEN, HOSP PERFORMED
Amphetamines: NOT DETECTED
Cocaine: POSITIVE — AB
Opiates: NOT DETECTED
Tetrahydrocannabinol: POSITIVE — AB

## 2010-08-29 LAB — COMPREHENSIVE METABOLIC PANEL
AST: 40 U/L — ABNORMAL HIGH (ref 0–37)
Albumin: 4.9 g/dL (ref 3.5–5.2)
Calcium: 9.8 mg/dL (ref 8.4–10.5)
Creatinine, Ser: 1.27 mg/dL (ref 0.4–1.5)
GFR calc Af Amer: >60 mL/min (ref >60)
Sodium: 136 mEq/L (ref 135–145)

## 2010-08-29 LAB — ACETAMINOPHEN LEVEL: Acetaminophen (Tylenol), Serum: <10 ug/mL — ABNORMAL LOW (ref 10–30)

## 2010-08-30 LAB — DIFFERENTIAL
Basophils Absolute: 0 10*3/uL (ref 0.0–0.1)
Basophils Relative: 0 % (ref 0–1)
Eosinophils Absolute: 0.2 10*3/uL (ref 0.0–0.7)
Lymphocytes Relative: 34 % (ref 12–46)
Lymphs Abs: 2.6 10*3/uL (ref 0.7–4.0)
Monocytes Absolute: 0.5 10*3/uL (ref 0.1–1.0)
Monocytes Relative: 11 % (ref 3–12)
Neutro Abs: 3.7 10*3/uL (ref 1.7–7.7)
Neutro Abs: 5.4 10*3/uL (ref 1.7–7.7)
Neutrophils Relative %: 48 % (ref 43–77)
Neutrophils Relative %: 60 % (ref 43–77)

## 2010-08-30 LAB — COMPREHENSIVE METABOLIC PANEL
ALT: 44 U/L (ref 0–53)
Alkaline Phosphatase: 93 U/L (ref 39–117)
BUN: 5 mg/dL — ABNORMAL LOW (ref 6–23)
CO2: 28 mEq/L (ref 19–32)
Calcium: 9.5 mg/dL (ref 8.4–10.5)
Chloride: 104 mEq/L (ref 96–112)
Glucose, Bld: 106 mg/dL — ABNORMAL HIGH (ref 70–99)
Glucose, Bld: 107 mg/dL — ABNORMAL HIGH (ref 70–99)
Potassium: 4.1 mEq/L (ref 3.5–5.1)
Sodium: 138 mEq/L (ref 135–145)
Total Bilirubin: 0.4 mg/dL (ref 0.3–1.2)
Total Protein: 7.4 g/dL (ref 6.0–8.3)

## 2010-08-30 LAB — URINALYSIS, ROUTINE W REFLEX MICROSCOPIC
Bilirubin Urine: NEGATIVE
Glucose, UA: NEGATIVE mg/dL
Hgb urine dipstick: NEGATIVE
Nitrite: NEGATIVE
Specific Gravity, Urine: 1 — ABNORMAL LOW (ref 1.005–1.030)
pH: 6 (ref 5.0–8.0)

## 2010-08-30 LAB — CBC
HCT: 40.3 % (ref 39.0–52.0)
Hemoglobin: 13.6 g/dL (ref 13.0–17.0)
Hemoglobin: 14.1 g/dL (ref 13.0–17.0)
MCHC: 33.8 g/dL (ref 30.0–36.0)
RBC: 4.06 MIL/uL — ABNORMAL LOW (ref 4.22–5.81)
RDW: 14 % (ref 11.5–15.5)
WBC: 8.9 10*3/uL (ref 4.0–10.5)

## 2010-08-30 LAB — RAPID URINE DRUG SCREEN, HOSP PERFORMED
Amphetamines: NOT DETECTED
Cocaine: POSITIVE — AB
Opiates: POSITIVE — AB
Tetrahydrocannabinol: POSITIVE — AB

## 2010-08-30 LAB — ETHANOL: Alcohol, Ethyl (B): 176 mg/dL — ABNORMAL HIGH (ref 0–10)

## 2010-09-27 NOTE — Assessment & Plan Note (Signed)
[facility] VISIT   MCCRAE, SPECIALE  DOB:  December 12, 1970                                        October 29, 2009  CHART #:  [id number]   Mr. Eliakim Tendler has contacted our office repeatedly, requesting pain  medication.  He has also been in touch with [facility] as well  as has visited the emergency room here at the hospital several times in  the past couple of weeks.  He was told that we would not refill any  further pain medications for him and he was offered a consult to the  pain clinic, which he declined.  We will see Mr. Helm back for his  routine follow-up appointment at 6 weeks.   Tera Mater. Arvilla Market, MD   BC/MEDQ  D:  10/29/2009  T:  10/29/2009  Job:  [id number]

## 2010-09-27 NOTE — Assessment & Plan Note (Signed)
[facility] VISIT   [name], [name]  DOB:  10/18/1970                                        October 15, 2009  CHART #:  [id number]   We saw Steven [name] in the office today.  He presented complaining of  chest soreness related to coughing.  His lungs are clear.  His incisions  are well-healed.  He has no sign of infection.  He says that he is  coughing up some dark stuff, but that that seems to be clearing.  He had  previously been given a prescription for antibiotics which he has chosen  not to fill.  His request today was for some pain medication related to  his chest soreness, and we prescribed OxyContin 5 mg 1 to 2 very 4 hours  as needed for pain, dispensed 60 with no refill and told him that he  would not be provided with any future pain medication.  He will call us  for any other problems and will be seen at his regular postop visit in 6  weeks.   Tera Mater. Arvilla Market, MD   BC/MEDQ  D:  10/15/2009  T:  10/15/2009  Job:  [id number]

## 2010-09-27 NOTE — H&P (Signed)
NAME:  Steven [name], Steven [name]                 ACCOUNT NO.:  [id number]   MEDICAL RECORD NO.:  [id number]          PATIENT TYPE:  IPS   LOCATION:  0304                          FACILITY:  [facility]   PHYSICIAN:  Jasmine Pang, M.D. DATE OF BIRTH:  [birth year]   DATE OF ADMISSION:  06/05/2008  DATE OF DISCHARGE:                       PSYCHIATRIC ADMISSION ASSESSMENT   This is a 40 year old male, involuntarily petitioned on June 05, 2008.  The patient is here on papers with papers stating that the  patient is real suicidal, threatening to leave the emergency room, and  is considered a threat to himself.  The patient reports he has a long  history of substance use, drinking, using marijuana, IV cocaine and IV  heroin, drinking up to a 12-pack at a time, which he states that is the  amount to keep him calm.  He last used heroin about 5 days ago and was  unable to obtain any more and states then he begins to drink.  He  reports being clean and sober for approximately 5 months last year while  on the methadone program, but was discharged from the program as he  tested positive for marijuana and cocaine.  Endorsing depressive  symptoms but denies any current suicidal thoughts.  The patient reports  that he also has a history of poking himself that helps with the pain  that he feels.   PAST PSYCHIATRIC HISTORY:  The patient was here approximately 9 years  ago, is currently sponsored by [facility], states he  is unable to take antidepressants due to sexual side-effects.   SOCIAL HISTORY:  He lives in [place].  He is unemployed.  He has  court date pending for DUI.   FAMILY HISTORY:  None.   ALCOHOL AND DRUG HISTORY:  Again, as above, drinking up to a 12-pack at  a time, using IV heroin or cocaine and marijuana.  Reports history of  blackouts.  Denies any seizure activity or DTs.   PRIMARY CARE Eloyse Causey:  None.   MEDICAL PROBLEMS:  History of hypertension.   MEDICATIONS:  Is  on no current medications.   DRUG ALLERGIES:  No known allergies.   PHYSICAL EXAM:  This is a middle-aged male, fully assessed at [facility] Emergency Department, who is complaining of vomiting and diarrhea  during his visit, received Zofran, Imodium and Ativan.  VITALS:  Show temperature of 97.7, pulse of 111, 18 respirations, blood  pressure is 140-93.   Chest x-ray showed no acute cardiomegaly.  Acetaminophen level is less  than 10.  Salicylate less than 4, alcohol level is 199.  AST is mildly  elevated at 40.  CBC within normal limits.  Urine drug screen was  positive for cocaine and positive for THC.   MENTAL STATUS EXAM:  The patient is alert and cooperative with poor eye-  contact.  He is appropriately dressed.  He appears somewhat pale, some  mild diaphoresis.  Speech is soft-spoken, normal pace.  Mood:  The  patient is experiencing some withdrawal symptoms, feeling somewhat  tense.  The patient is calm  and not hostile or aggressive.  Thought  processes are coherent and goal-directed.  Denies any suicidal thoughts.  Cognitive function intact.  His memory appears to be good.  Judgment and  insight minimal.   AXIS I:  Polysubstance abuse/dependence.  AXIS II:  Deferred.  AXIS III:  Hypertension.  AXIS IV:  Problems with occupation, possible problems with housing,  other psychosocial problems.  AXIS V:  Current is 35-40.   We will put patient on the clonidine and Librium protocol.  We will have  Seroquel available on a p.r.n. basis as the patient reports that being  effective prior.  The patient will be in the red group.  We will  continue to assess comorbidities and we will identify his support group.  His tentative length of stay is three to five days.      Landry Corporal, N.P.      Jasmine Pang, M.D.  Electronically Signed    JO/MEDQ  D:  06/05/2008  T:  06/05/2008  Job:  [id number]

## 2010-09-30 NOTE — H&P (Signed)
NAME:  Steven [name], Steven [name]                           ACCOUNT NO.:  [id number]   MEDICAL RECORD NO.:  [id number]                   PATIENT TYPE:  INP   LOCATION:  5731                                 FACILITY:  [facility]   PHYSICIAN:  Jamison Neighbor, M.D.               DATE OF BIRTH:  05-09-1971   DATE OF ADMISSION:  12/22/2003  DATE OF DISCHARGE:                                HISTORY & PHYSICAL   DIAGNOSIS:  Renal colic, status post in situ laser lithotripsy.   HISTORY:  This 40 year old male has a longstanding history of stones. The  patient has had by his description at least five ESWL treatments as well as  multiple stents. The patient has had much of this therapy done at [facility]. He was traveling in [place] and developed left-sided renal colic.  He underwent what sounds like cystoscopy, ureteroscopy, and in situ laser  lithotripsy for a stone at the left UVJ. The patient stayed in the hospital  overnight. Follow-up x-rays apparently demonstrated stone fragment still  present and approximately half the stone had been broken up. The patient was  advised that he will need to have follow-up for treatment of the remaining  stone possibly with ESWL. He was left with a stent in place.   The patient has been at [facility] and was scheduled for a follow-up  visit this Friday with a plan for eventual percutaneous nephrolithotripsy  for management of the residual stone material. 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 The patient has  seen Dr. August Saucer Assimos, an acknowledged national expert in stone management  and it seems appropriate for him to have his therapy with  Dr. Teofilo Pod since he has already established a relationship.   The patient presented to the emergency room at [facility] on December 22, 2003, stating that the stent was not working and he had  severe pain. A  KUB was obtained and preliminary report says that there were no stone  fragments down along the course of the ureter, but it appears that the stent  was not working as the patient had severe renal colic. His urine was noted  to be quite dark without clots, and it was felt that he needed to be  admitted for additional management.   PAST MEDICAL HISTORY:  Otherwise unremarkable. He has no chronic medical  illnesses. He takes no medications on a regular basis. He has no known  allergies.  His only previous surgery was an appendectomy at age 4 as well  as multiple stents and lithotripsy procedures.   FAMILY HISTORY:  Pertinent for father with coronary artery disease, status  post coronary artery bypass graft. There is also a strong family history of  stones with both his  brother and sister with stone disease.   SOCIAL HISTORY/REVIEW OF SYSTEMS:  Noncontributory. The patient is noted to  be a smoker and does use alcohol, but no street drugs.   PHYSICAL EXAMINATION:  GENERAL: He is a well-developed, well-nourished male  with acute renal colic.  HEENT: Cranial nerves II-XII grossly intact.  NECK: Supple with no adenopathy or thyromegaly.  LUNGS: Clear.  HEART: Regular rate and rhythm. No murmurs, thrills, heaves, rubs, or  gallops  ABDOMEN: Soft with tenderness and left flank pain, but no rebound or  guarding. No masses on exam.  CHEST: Normal in size, shape, and consistency with no hydrocele, hematocele,  varicocele, or hilar adenopathy.  GU: Penis free of any lesions. The meatus is normal.  EXTREMITIES: No clubbing, cyanosis, or edema.  NEUROLOGIC/VASCULAR: Appear to be intact.   The patient will be admitted for pain management with a Dilaudid PCA as well  as Zofran because of the severity of his nausea. He will be placed on clear  liquids because he has had so much nausea. He will have a percutaneous  nephrostomy tube placed on December 23, 2003. This should decompress  his  kidney without the risk of him obstructing the way he did with his stent. He  will keep his scheduled appointment with Dr. Teofilo Pod and undergo  percutaneous nephrolithotripsy at [facility] as scheduled.                                                Jamison Neighbor, M.D.    RJE/MEDQ  D:  12/23/2003  T:  12/23/2003  Job:  [id number]

## 2010-09-30 NOTE — H&P (Signed)
[facility]  Patient:    Steven [name], Steven [name] Visit Number: [id number] MRN: [id number]          Service Type: EMS Location: ED Attending Physician:  Sandi Raveling Dictated by:   Candi Leash. Orsini, N.P. Admit Date:  04/20/2001 Discharge Date: 04/20/2001                     Psychiatric Admission Assessment  IDENTIFYING INFORMATION: This is a 40 year old, separated, white male, voluntary admitted for alcohol and cocaine abuse, requesting detox.  HISTORY OF PRESENT ILLNESS: The patient presents with a history of alcohol abuse and cocaine abuse requesting detox. The patient reports he is getting tired of "all this." He reports he has a history of panic attacks and has been using beer to control his anxiety and then using cocaine to drink some more. The patient reports that he feels stressed everyday and that he would rather drink that experience this stress and anxiety. He reports some depressive symptoms and suicidal ideation, but will contract for safety. No homicidal ideation. No auditory visualization. No suicidal or homicidal ideation. No paranoia. The patient is denying any withdrawal symptoms at present.  PAST PSYCHIATRIC HISTORY: His second hospitalization to [facility]; last visit was approximately 3 months ago for detox, his longest history of sobriety has been 2 months. The patient reports he did not followup on his outpatient treatment.  SOCIAL HISTORY: He is a 40 year old, separated, white male with no children. He lives with his grandmother. He works at [employer]. He has completed the 9th grade. He has a DUI pending on May 03, 2001, second DUI, states actually his 4th, he has 2 from another state.  FAMILY HISTORY: Unknown.  ALCOHOL/DRUG HISTORY: The patient smokes. He has been drinking up to 12 or more beers per day, last drink was at 2 a.m. on April 19, 2001, with a history of blackouts. No history of seizures. He uses  approximately 100 to 200 dollars worth of cocaine every day snorting and smoking. Also using some OxyContin in addition.  PRIMARY CARE PHYSICIAN: None.  PAST MEDICAL HISTORY: Medical problems are none.  MEDICATIONS: None. The patient was on Celexa and had stopped due to his sexual side effects.  ALLERGIES: No known drug allergies.  PHYSICAL EXAMINATION: Physical examination was performed at [facility] Emergency Department.  Alcohol level is 61, potassium is 3.4. Urine drug screen was positive for cocaine.  MENTAL STATUS EXAMINATION: He is an alert, middle-aged, casually dressed, cooperative client with no eye contact. Speech is normal and relevant, swearing at times. Mood is pleasant. Affect is patient laughs inappropriately at situation. Thought processes are coherent. No evidence of psychosis. No auditory or visual hallucinations. No suicidal or homicidal ideation. No paranoia. Cognitive functioning is intact. Memory is fair. Judgment is poor. Insight is poor and poor impulse control.  ADMISSION DIAGNOSES: Axis I:    Major depressive disorder, alcohol abuse/dependence, polysubstance           abuse. Axis II:   Deferred. Axis III:  None. Axis IV:   Problems with lack of primary support group, other psychosocial            problems. Axis V:    Current is 30, estimated this past year is 60.  PLAN: Voluntary admission to [facility] for a history of alcohol abuse, requesting detox. Contract for safety. Check every 15 minutes. Will initiate the phenobarbital protocol, encourage fluids. Will initiate Seroquel for anxiety and BuSpar t.i.d.  for anxiety. Dr. Lourdes Sledge is in for interview and treatment plan.  Our goal is to detox safely, to remain alcohol and drug free. The patient appears to be at a high risk for a relapse. We will work on Pharmacologist and individual and group therapy. The patient is to attend AA and NA meetings and to be medication  compliant.  TENTATIVE LENGTH OF STAY: Three to five days. Dictated by:   Candi Leash. Orsini, N.P. Attending Physician:  Sandi Raveling DD:  04/21/01 TD:  04/22/01 Job: [id number]

## 2010-09-30 NOTE — Discharge Summary (Signed)
[facility]  Patient:    Steven [name], Steven [name] Visit Number: [id number] MRN: [id number]          Service Type: PSY Location: 40 [id number] Attending Physician:  Rachael Fee Dictated by:   Reymundo Poll Dub Mikes, M.D. Admit Date:  02/15/2001 Discharge Date: 02/20/2001                             Discharge Summary  CHIEF COMPLAINT/PRESENT ILLNESS:  This was the second admission to [facility] for this 40 year old male voluntarily admitted for alcohol dependence requesting detox.  He has history of alcohol abuse and using an assortment of drugs including cocaine, marijuana, and opioids.  He wanted to stop being an embarrassment to his family.  He has decreased sleep with a minimum of four hours per night and decreased appetite with not eating for several days in a row.  His last drink was on Thursday in the morning.  He had four 20-oz beers and some Librium.  He denies any hallucinations, except when he is taking methadone.  He admits to some depression after his binges.  He denies any current suicidal or homicidal ideation.  Some pains and cramps in hit foot, irritability, and anxiety.  PAST MEDICAL HISTORY:  He was hospitalized about seven months ago for alcohol detox.  ALCOHOL/DRUG HISTORY:  He relapsed about a month ago.  He has been binge drinking, snorting cocaine, and smoking it, last use Thursday, using marijuana every day, oxycodone, and Demerol.  MEDICAL HISTORY:  Noncontributory.  PHYSICAL EXAMINATION:  Performed, failed to show any acute findings.  MENTAL STATUS EXAMINATION:  Revealed an alert male, average weight, neat, casually dressed.  Speech is normal, relevant, and expansive, swearing at times.  His mood is depressed.  Affect is flat and actually thought processes are coherent.  No evidence of psychosis.  No auditory or visual hallucinations.  No suicidal or homicidal ideas.  Cognition intact.  ADMITTING DIAGNOSES: Axis I:     Depressive disorder, not otherwise specified; mixed substance            dependence. Axis II:   Deferred. Axis III:  No diagnosis. Axis IV:   Moderate. Axis V:    Global Assessment of Functioning upon admission is 35-40, highest            Global Assessment of Functioning in the last year 65.  LABORATORY WORKUP:  CBC was within normal limits.   CMET was within normal limits.  Thyroid profile was within normal limits.  COURSE IN HOSPITAL:  He was admitted and started on intensive individual and group psychotherapy.  We went ahead and helped detox him with guanabenz as well as Librium.  Detoxification went uneventfully due to the depression that he claimed he experienced.  Between episodes, he was started on Celexa that was increased up to 40 mg per day.  Gradually, his improved.  His affect became brighter.  Detoxification went uneventfully.  On October 8, his was still ruminating about past events, still dealing with the losses, yet has been able to grieve over the losses.  Celexa was increased to 40 mg per day. On October 9, much improved, willing and motivated to pursue further outpatient follow-up.  No suicidal ideas.  No homicidal ideas.  He said he is not going to do as he did last time and go straight to his primary physician to get Xanax.  He was willing to pursue  CDIOP through [facility].  DISCHARGE DIAGNOSES: Axis I:    Mixed substance dependence, depressive disorder not otherwise            specified. Axis II:   No diagnosis. Axis III:  No diagnosis. Axis IV:   Moderate. Axis V:    Upon discharge 60.  DISCHARGE MEDICATIONS: 1. Celexa 40 mg per day. 2. Protonix 40 mg daily. 3. Seroquel 25 q.6h. as needed. 4. Vistaril 25 q.6h. as needed.  Walk-in [facility] to pursue further work with [facility]. Dictated by:   Reymundo Poll Dub Mikes, M.D. Attending Physician:  Rachael Fee DD:  03/17/01 TD:  03/19/01 Job: [id number]

## 2010-09-30 NOTE — H&P (Signed)
[facility]  Patient:    Steven [name], Steven [name]                          MRN: [id number] Adm. Date:  [id number] Attending:  Denny Peon                         History and Physical  CHIEF COMPLAINT:  This is the first admission to [facility] for this 40 year old male who was admitted after he stated that he could not continue to live anymore due to his persistence use of alcohol.  HISTORY OF PRESENT ILLNESS:  Steven [name] claims he has been drinking almost every day for the last 15 years.  He has gotten worse in the last seven years. He claimed that his nerves are shot.  He drinks lately 12 pack of beer per day and/or a fifth of liquor.  Many times during the weekend he has drank the 12 pack and the fifth of liquor.  States that the last six months the use of liquor is increasing to three to four times per week.  He has tried to quit several times but by the second day he becomes irritable, angry, sweating, has nausea and vomiting.  He says he does not make it any further than two days. He has never had seizures.  He states that in the last six months he started using cocaine also.  He states that he gets so drunk that he uses cocaine to sober up to drink some more.  The use of cocaine has been escalating.  He was using once a month before and now is using weekly.  He states that he has not been depressed a lot yet he is very frustrated.  He was suicidal when he came here.  He says he does not want to keep going on the way he is now.  He says that he was charged with a DWI at Christmas this year.  He did eight days in jail.  He is actively going to an outpatient program.  He has no license, and he continues to drink and drive.  He has had three previous DWIs in [place]. He claims that he suffers from panic attacks and as of late he is having more panic waking him up in the middle of the night, shaky, scared, does not  think clearly, increased heart rate.  Precipitating events:  He actually fell from a roof and broke his arm.  He went through surgery and states having being able to take care of his arm and it was the type of surgery that would not be a complete guarantee that he is not going to remain with pain.  Claims that the elbow hurts all of the time.  He is a Psychologist, occupational.  He has persistent pain.  He uses liquor to ease the pain.  He says the first thing in the morning he takes Tylenol and drinks.  He lives in [place].  He came from [place].  He is separated from his wife.  He has been in [place] for the last six to seven months and that is the time when he got to use cocaine due to the problem he has with himself with her.  He does not see any hope for the relationship.  He has been married 12 years, no kids but the wife is pretty much fed up with this  persistent use of alcohol.  PAST PSYCHIATRIC TREATMENT:  No formal treatment, no formal detox.  He has been drinking while in the outpatient program after he was charged with DWI. He claims that he made his own liquor when he was in jail putting orange juice, bread and sugar together and allowing it to ferment.  He was come to practitioners to deal with the panic.  He was placed on Xanax 5-6 years ago. He has been tried on Wellbutrin, Zoloft, Effexor, Paxil and probably Celexa. He claims that presently he feels like he is on acid.  SOCIAL HISTORY:  He is split from his wife for six months.  He has been in [place] in the last six or seven months from [place].  He has been married 12 years with no children.  The drinking has to do with it.  His family lives here in [place] so he thought that here he would have some more support. He is living with his grandmother.  Wife is still in [place].  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  In [place] worked for five years in a good job that says now that looking back he would have liked to kept.  FAMILY  HISTORY:  Grandfather alcoholic.  Younger brother has a drinking problem.  The grandmother has panic attack and family members on both sides of the family have bad nerves.  ALCOHOL AND DRUG HISTORY:  He has used alcohol for the last 15 years.  When he was 15 he used acid two or three times.  He used cocaine in the last six months snorting.  PAST MEDICAL HISTORY:  He has bronchial asthma; he does not take medication for it.  Surgery for elbow, broke two bones, hurts "like hell."  He also injured his thumb and injured his arm and has persistent pain.  MEDICATIONS:  He is actively not taking any medications.  He takes Tylenol. Before, he was given opiates for the pain, Percocet and _________,  and he has not followed with anyone lately.  ALLERGIES:  He denies any drug allergies.  PHYSICAL EXAMINATION:  The physical exam is going to be performed and dictated.  MENTAL STATUS EXAMINATION:  Well-developed, well-nourished, cooperative male. Speech is normal tempo.  Production is adequate, not pressured.  His mood is more anxiety and depression.  He worries about the presence of detox, worried that he is going to express anger and worry that he is going to lose it, worried that he is going to have the panic attacks, worried that he will have nothing to deal with the pain because he is using alcohol to treat the pain because he is using alcohol to treat the pain but understands the anxiety of why he is drinking.  His thought processes are logic, coherent and relevant. At this time, he denies any suicidal ideation.  Cognition: He is alert, oriented to person, place and time.  Recent and remote memory are well-preserved.  ADMITTING DIAGNOSES: Axis I.    1. Alcohol dependence and alcohol withdrawal.            2. Cocaine abuse.            3. Panic attack disorder.  Rule out generalized               anxiety disorder and chronic pain. Axis II:   No diagnosis. Axis III:  1. Status post trauma  to this arm with a broken elbow and thumb.  2. Bronchial asthma. Axis IV:   Code 3. Axis V:    GAF on admission 40-45, highest in the last year 65.   PLAN:  We are going to further evaluate comorbidities and try to get him into recovery frame of mind and once there we will work also on relapse prevention. We are going to have a chronic pain approach and going to recommend pain clinic when he gets out of the hospital.  Meanwhile we are going to start Neurontin 100 mg three times a day as well as amitriptyline 25 mg at bedtime. We are going to use Seroquel 50 mg four times a day to help with the agitation and anxiety.  We are going to further educate on the addiction and work on starting to maintain his sobriety when he is fully detoxed as well as reassess treatment of his comorbid conditions.  We probably are going to defer to the _____ when he is out so we can further pursue work on his recovery.  ESTIMATED LENGTH OF STAY:  Five days to fully detox, high risk of relapse, no significant sobriety, no major support system in [place] and already legal consequences actively driving and drinking. DD:  09/13/00 TD:  09/14/00 Job: [id number]

## 2010-09-30 NOTE — Op Note (Signed)
[facility]  Patient:    Steven [name], Steven [name]                          MRN: [id number] Proc. Date: 10/21/00 Adm. Date:  [id number] Disc. Date: [id number] Attending:  Carmelina Peal                           Operative Report  PREOPERATIVE DIAGNOSIS:  Left ureteral stone.  POSTOPERATIVE DIAGNOSIS:  Left ureteral stone.  PROCEDURE:  Cystoscopy, left retrograde pyelogram, ureteroscopy with stone extraction, and insertion of double J catheter.  SURGEON:  Dr. Brunilda Payor.  ANESTHESIA:  General.  INDICATIONS FOR PROCEDURE:  The patient is a 40 year old male who was seen in the emergency room four days ago with left lower quadrant pain associated with nausea. A CT scan showed a 5 mm stone at the left UVJ. He was see again in the emergency room last night at [facility] for severe left flank pain and was treated with oral analgesics. He returned to the emergency room this evening with the same symptoms. He is scheduled for cystoscopy, left retrograde pyelogram and stone manipulation.  DESCRIPTION OF PROCEDURE:  Under general anesthesia, the patient was prepped and draped and placed in the dorsal lithotomy position. A #21 Wappler cystoscope was inserted in the bladder. The urethra is normal. The bladder is normal. There is no stone or tumor in the bladder. The ureteral orifices are in normal position and shape. A cone tip catheter was then passed through the cystoscope into the left ureteral orifice. Contrast was then injected through the cone tip catheter. There is a filling defect in the distal ureter with proximal hydroureter. The cone tip catheter was removed. A guidewire could not be passed beyond the stone. The guidewire was removed. A Glidewire was passed through an open end catheter and the Glidewire was passed into the collecting system. The open end catheter was then passed over a Glidewire into the proximal  ureter. The Glidewire was then removed.  The guidewire was passed through the open end catheter into the collecting system and the open end catheter was removed. The cystoscope was removed. A rigid ureteroscope was then passed in the bladder and into the ureter. The stone was visualized in the distal ureter and the stone basket was passed through the ureteroscope and the stone was extracted with the stone basket. The ureteroscope was then reinserted in the bladder and in the ureter and contrast was then injected through the ureteroscope. There was no evidence of extravasation of contrast. The ureteroscope was removed. The guidewire was back loaded into the cystoscope and a 6 French--26 double J catheter was passed over the guidewire with the proximal end of the double J catheter in the collecting system and the distal end in the bladder.  The bladder was then emptied and the cystoscope and guidewire were removed.  The patient tolerated the procedure well and left the OR in satisfactory condition to post anesthesia care unit. DD:  10/21/00 TD:  10/22/00 Job: [id number]

## 2010-09-30 NOTE — Discharge Summary (Signed)
[facility]  Patient:    Steven [name], Steven [name] Visit Number: [id number] MRN: [id number]          Service Type: EMS Location: ED Attending Physician:  Pearletha Alfred Dictated by:   Reymundo Poll Dub Mikes, M.D. Admit Date:  05/12/2001 Discharge Date: 05/12/2001                             Discharge Summary  CHIEF COMPLAINT AND HISTORY OF PRESENT ILLNESS:  This was the third admission to [facility] for this 40 year old male admitted for alcohol and cocaine abuse, requesting detoxification.  Presented with a history of alcohol abuse and cocaine, reported getting "tired of all this," reported he had a history of panic attacks, has been using beer to control his anxiety and then using cocaine.  The patient reported that he felt stress every day and that he would rather drink than experience the stress and anxiety.  Reported some depressive symptoms and suicidal ideas but would contract for safety, no homicidal ideas, no auditory or visual hallucinations, no paranoia, no active withdrawal.  PAST PSYCHIATRIC HISTORY:  Twice before at [facility], does not follow up on an outpatient basis.  SUBSTANCE ABUSE HISTORY:  Up to 12 or more beers per day; last drink December 6.  History of blackouts, no history of seizures.  Cocaine $100-200 worth. Also using some OxyContin.  PAST MEDICAL HISTORY:  Noncontributory.  MEDICATIONS ON ADMISSION:  Celexa; stopped due to sexual side effects.  PHYSICAL EXAMINATION:  GENERAL:  Performed and failed to show any active findings.  MENTAL STATUS EXAMINATION ON ADMISSION:  Alert male, casually dressed, cooperative, no eye contact.  Speech was normal and relevant, swearing at times.  Mood was pleasant.  Affect: At times laughed inappropriately.  Thought processes: Coherent, no evidence of psychosis, no auditory or visual hallucinations, no suicidal or homicidal ideas, no paranoia.   Cognitive: Well preserved.  ADMITTING DIAGNOSES: Axis I:    1. Alcohol dependence.            2. Cocaine and opiate abuse.            3. Anxiety disorder, not otherwise specified.            4. Depressive disorder, not otherwise specified. Axis II:   No diagnosis. Axis III:  No diagnosis. Axis IV:   Moderate. Axis V:    Global assessment of functioning upon admission 30, highest global            assessment of functioning in the last year 60.  HOSPITAL COURSE:  He was detoxified using phenobarbital.  He was placed on Lexapro as well as BuSpar and Seroquel to help with anxiety.  The Seroquel did not help much so he was started on Zyprexa 2.5 mg three times a day and Lexapro was increased to 20 mg per day.  The patient was looking into possibly putting in some jail time due to four DWIs and failure to appear.  He was getting himself ready to spend the time in jail.  This was very stressful as he anticipated being discharged and facing what he had to face, did ruminate a lot.  But he did a lot of projecting, a lot of rationalizing, intellectualizing.  His speech was more circumstantial, almost like monologue, little interaction.  He claimed to be motivated this time around more so than before so he stated that he was  willing to do CD IOP and be compliant with medications this time around.  As he was fully detoxified and there was no evidence of suicidal or homicidal ideas, we went ahead him to outpatient followup.  DISCHARGE DIAGNOSES: Axis I:    1. Alcohol dependence.            2. Cocaine and opiate abuse.            3. Depressive disorder, not otherwise specified.            4. Anxiety disorder, not otherwise specified. Axis II:   No diagnosis. Axis III:  No diagnosis. Axis IV:   Moderate. Axis V:    Global assessment of functioning upon discharge 55-60.  DISCHARGE MEDICATIONS: 1. Lexapro 20 mg per day. 2. Zyprexa 2.5 mg three times a day. 3. BuSpar 15 mg one half three  times a day.  [facility]UP:  [facility]. Dictated by:   Reymundo Poll Dub Mikes, M.D. Attending Physician:  Pearletha Alfred DD:  06/05/01 TD:  06/06/01 Job: [id number]

## 2010-09-30 NOTE — H&P (Signed)
[facility]  Patient:    Steven [name], Steven [name] Visit Number: [id number] MRN: [id number]          Service Type: PSY Location: 40 [id number] [id number] Attending Physician:  Rachael Fee Dictated by:   Candi Leash. Orsini, N.P. Admit Date:  02/15/2001                     Psychiatric Admission Assessment  IDENTIFYING INFORMATION:  This is a 40 year old separated white male voluntarily admitted on February 15, 2001 for alcohol dependence, requesting detox.  HISTORY OF PRESENT ILLNESS:  The patient presents with a history of alcohol abuse and using an assortment of drugs including cocaine, THC and opiates. The patient came in wanting to stop being an embarrassment to his family.  He reports he has decreased sleep, averaging about four hours per night with a decreased appetite, not eating for several days in a row.  His last drink was on Thursday a.m., when he had four 22-ounce beers and some Librium.  He denies any hallucinations except when he is taking methadone.  He admits to some depression after his binges.  He denies any current suicidal or homicidal ideation.  He reports he is currently experiencing some cramps in his foot and irritability and anxiety.  PAST PSYCHIATRIC HISTORY:  This is his second visit to [facility]; last hospitalization was about seven months ago for alcohol detox. His longest history of sobriety was one month for drugs and alcohol.  SOCIAL HISTORY:  He is a 40 year old separated white male.  He has no children.  He lives with his grandmother.  He has been laid off from work. His drinking has increased since then.  He has completed the 10th grade.  He has a DUI with a court date pending on October 29th.  This is his second DUI and he has had two others in [place].  FAMILY HISTORY:  None.  ALCOHOL/DRUG HISTORY:  He smokes.  He relapsed about a month ago and has been binge drinking.  He has been snorting cocaine and smoking it.  Last  used on Thursday.  Also using marijuana every day and OxyContin and Demerol with some use last Sunday.  PRIMARY CARE PHYSICIAN:  None.  MEDICAL PROBLEMS:  The patient reports feeling dizzy quite often.  Has been experiencing this for about a year with no workup.  MEDICATIONS:  None.  DRUG ALLERGIES:  None.  PHYSICAL EXAMINATION:  Performed at [facility].  Blood pressure 147/87.  LABORATORY DATA:  Potassium 3.4, glucose 134.  WBC count was elevated at 12.  MENTAL STATUS EXAMINATION:  He is an alert, young, middle-aged, average weight Caucasian male.  Neat and casually dressed.  Speech is normal, relevant and expansive, swearing at times.  Mood is depressed.  Affect is flat and anxious. Thought processes are coherent.  There is no evidence of psychosis.  No auditory or visual hallucinations.  No suicidal or homicidal ideation. Cognitive function was intact.  Memory is fair.  Judgment is fair.  Insight is poor.  DIAGNOSES: Axis I:    1. Depressive disorder.            2. Alcohol abuse and dependence.            3. Polysubstance dependence. Axis II:   Deferred. Axis III:  "Dizziness." Axis IV:   Problems with occupation, other psychosocial problems. Axis V:    Current 40; estimated this past year 43.  PLAN:  Voluntary admission to  [facility] for alcohol and drug dependence.  Contract for safety.  Check every 15 minutes.  Will encourage fluids.  Will have patient on Librium protocol.  Will check his potassium in the morning.  Will encourage Gatorade.  Will have Seroquel available for agitation.  Our goal is to detox safely, to remain alcohol and drug-free, to be medication compliant, increase his coping skills, to follow up with AA and mental health.  TENTATIVE LENGTH OF STAY:  Three to five days. Dictated by:   Candi Leash. Orsini, N.P. Attending Physician:  Rachael Fee DD:  02/16/01 TD:  02/16/01 Job: [id number]

## 2010-09-30 NOTE — Discharge Summary (Signed)
Steven [name], Steven [name]                 ACCOUNT NO.:  [id number]   MEDICAL RECORD NO.:  [id number]          PATIENT TYPE:  IPS   LOCATION:  0506                          FACILITY:  [facility]   PHYSICIAN:  Geoffery Lyons, M.D.      DATE OF BIRTH:  [birth year]   DATE OF ADMISSION:  06/05/2008  DATE OF DISCHARGE:  06/10/2008                               DISCHARGE SUMMARY   CHIEF COMPLAINT/HISTORY OF PRESENT ILLNESS:  This was the second or  third admission to [facility] for this 40 year old  male involuntary petition.  The papers said he was real suicidal,  threatening to leave the emergency room, considered desperate.  He had  long history of substance use, drinking, using marijuana, IV cocaine and  IV heroin.  Drinking up to 12-pack at a time.  Claimed this is why he  needs to be calm.  He last used heroin about 5 days prior to this  admission, unable to abstain any more and then he began to drink.  Last  year, he was in the methadone program and he stayed abstinent for 7-  months, but he was discharged because he was positive for marijuana and  cocaine.  Endorsing depressive thoughts, but no suicide ideations.   PAST PSYCHIATRIC HISTORY:  He was admitted around 9 years prior to this  admission.  He was again in a methadone program.  Had to be tried on  medications, but endorsed sexual side effects and depression.  He is not  willing to look into taking any.   ALCOHOL/DRUG HISTORY:  As already stated.  Persistent use of alcohol,  opiates, cocaine and marijuana.   MEDICAL HISTORY:  Hypertension.   MEDICATIONS:  None.   PHYSICAL EXAMINATION:  Exam failed to show any acute findings.   LABORATORY WORK:  White blood cells 11.5, hemoglobin 12.9, mean  corpuscular volume 98.6, SGOT 40.  CBC within normal limits as already  stated.  UDS positive for cocaine and marijuana.   MENTAL STATUS EXAM:  Reveals alert, cooperative male with poor eye  contact, appropriately dressed.  Speech  is soft spoken.  Mood depressed.  Affect depressed.  Claimed he was feeling somewhat tense. Thought  processes are logical, coherent and relevant, some rambling quality to  his speech, monologue-type of production.  Denies any active suicide or  homicide ideations.  No delusions, hallucinations.  Cognition well  preserved.   ADMISSION DIAGNOSES:  Axis I:  Opiate dependence, alcohol, cocaine,  marijuana dependence.  Anxiety disorder not otherwise specified.  Mood  disorder not otherwise specified.  Axis II:  No diagnosis.  Axis III:  Hypertension, moderate.  Axis IV:  Upon admission 35, global assessment of functioning in the  last year 60.   COURSE IN THE HOSPITAL:  He was admitted, started individual and group  psychotherapy.  As already stated, he actually called the ambulance as  he endorsed I am suicidal.  Was wanting to get back in the methadone  program.  He was complaining of irritability, anxiety.  On June 08, 2008, he continued to endorse that  he was having a very hard time with  severe anxiety and cannot stop ruminating about things in the past what  he did, but he could have done differently.  Admits to OCD symptoms.  Have to have things in certain order.  Unable to stop worrying.  He did  the best in the methadone clinic.  He has continued to use heroin IV as  well as cocaine and alcohol to control the anxiety.  Would like to go  into the methadone clinic, but said that he owes them money around $800  and they need payment if he is to get the methadone back.  We worked  with Seroquel.  He had also tried Risperdal before, but Seroquel seemed  to have been better for him.  We continued to work on the detox, help to  stabilize his mood.  We adjusted the Seroquel.  He contacted the  methadone clinic.  They were willing to take him back, but the counselor  at the [facility]one [facility] recommended that he go through [facility]  first and then worked towards getting himself  back in the [facility].  Upon discharge in full contact with reality.  No overt withdrawal.  No  active suicidal or homicidal ideas.  No hallucinations or delusions.  Willing and motivated to pursue further outpatient followup.   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  Mood disorder not otherwise  specified.  Axis II:  No diagnosis.  Axis III:  Hypertension.  Axis IV:  Moderate.  Axis V:  Upon discharge 55-60.   DISCHARGE MEDICATIONS:  1. Protonix 40 mg per day.  2. Seroquel 50 mg 3 times at a.m. and 100 mg at bedtime.  3. Mucinex 600 mg twice a day.   FOLLOW UP:  Follow up with [facility] and possibly [facility] for methadone.      Geoffery Lyons, M.D.  Electronically Signed     IL/MEDQ  D:  06/19/2008  T:  06/20/2008  Job:  [id number]
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
## 2011-11-11 ENCOUNTER — Encounter (HOSPITAL_COMMUNITY): Payer: Self-pay

## 2011-11-11 ENCOUNTER — Emergency Department (HOSPITAL_COMMUNITY): Payer: No Typology Code available for payment source

## 2011-11-11 ENCOUNTER — Emergency Department (HOSPITAL_COMMUNITY)
Admission: EM | Admit: 2011-11-11 | Discharge: 2011-11-11 | Disposition: A | Discharge status: Home / Self Care | Payer: No Typology Code available for payment source | Attending: Emergency Medicine | Admitting: Emergency Medicine

## 2011-11-11 DIAGNOSIS — I10 Essential (primary) hypertension: Secondary | ICD-10-CM | POA: Insufficient documentation

## 2011-11-11 DIAGNOSIS — S161XXA Strain of muscle, fascia and tendon at neck level, initial encounter: Secondary | ICD-10-CM

## 2011-11-11 DIAGNOSIS — IMO0002 Reserved for concepts with insufficient information to code with codable children: Secondary | ICD-10-CM | POA: Insufficient documentation

## 2011-11-11 DIAGNOSIS — M79609 Pain in unspecified limb: Secondary | ICD-10-CM | POA: Insufficient documentation

## 2011-11-11 DIAGNOSIS — F17200 Nicotine dependence, unspecified, uncomplicated: Secondary | ICD-10-CM | POA: Insufficient documentation

## 2011-11-11 DIAGNOSIS — S139XXA Sprain of joints and ligaments of unspecified parts of neck, initial encounter: Secondary | ICD-10-CM | POA: Insufficient documentation

## 2011-11-11 DIAGNOSIS — M25519 Pain in unspecified shoulder: Secondary | ICD-10-CM | POA: Insufficient documentation

## 2011-11-11 DIAGNOSIS — R51 Headache: Secondary | ICD-10-CM | POA: Insufficient documentation

## 2011-11-11 DIAGNOSIS — M545 Low back pain, unspecified: Secondary | ICD-10-CM

## 2011-11-11 DIAGNOSIS — M542 Cervicalgia: Secondary | ICD-10-CM | POA: Insufficient documentation

## 2011-11-11 DIAGNOSIS — S80812A Abrasion, left lower leg, initial encounter: Secondary | ICD-10-CM

## 2011-11-11 DIAGNOSIS — E7800 Pure hypercholesterolemia, unspecified: Secondary | ICD-10-CM | POA: Insufficient documentation

## 2011-11-11 DIAGNOSIS — S20219A Contusion of unspecified front wall of thorax, initial encounter: Secondary | ICD-10-CM

## 2011-11-11 DIAGNOSIS — I2510 Atherosclerotic heart disease of native coronary artery without angina pectoris: Secondary | ICD-10-CM | POA: Insufficient documentation

## 2011-11-11 DIAGNOSIS — R079 Chest pain, unspecified: Secondary | ICD-10-CM | POA: Insufficient documentation

## 2011-11-11 HISTORY — DX: Essential (primary) hypertension: I10

## 2011-11-11 HISTORY — DX: Pure hypercholesterolemia, unspecified: E78.00

## 2011-11-11 HISTORY — DX: Atherosclerotic heart disease of native coronary artery without angina pectoris: I25.10

## 2011-11-11 MED ORDER — DIAZEPAM 5 MG PO TABS: 5.0000 mg | ORAL_TABLET | Freq: Three times a day (TID) | ORAL | Status: AC | PRN

## 2011-11-11 MED ORDER — ASPIRIN 81 MG PO CHEW
CHEWABLE_TABLET | ORAL | Status: AC
  Filled 2011-11-11: qty 4

## 2011-11-11 MED ORDER — HYDROCODONE-ACETAMINOPHEN 5-500 MG PO TABS: 1.0000 | ORAL_TABLET | Freq: Four times a day (QID) | ORAL | Status: AC | PRN

## 2011-11-11 MED ORDER — IBUPROFEN 600 MG PO TABS: 600.0000 mg | ORAL_TABLET | Freq: Four times a day (QID) | ORAL | Status: AC | PRN

## 2011-11-11 MED ORDER — OXYCODONE-ACETAMINOPHEN 5-325 MG PO TABS
1.0000 | ORAL_TABLET | Freq: Once | ORAL | Status: AC
  Administered 2011-11-11: 1 via ORAL
  Filled 2011-11-11: qty 1

## 2011-11-11 MED ORDER — DIAZEPAM 5 MG PO TABS
5.0000 mg | ORAL_TABLET | Freq: Once | ORAL | Status: AC
  Administered 2011-11-11: 5 mg via ORAL
  Filled 2011-11-11: qty 1
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
## 2011-11-11 NOTE — ED Notes (Signed)
LSB removed by Lemont Fillers PA
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
## 2011-11-11 NOTE — ED Notes (Signed)
Patient transported to X-ray 

## 2011-11-12 NOTE — ED Provider Notes (Signed)
Medical screening examination/treatment/procedure(s) were performed by non-physician practitioner and as supervising physician I was immediately available for consultation/collaboration.  Lenah Messenger K Linker, MD 11/12/11 1517 

## 2011-11-26 ENCOUNTER — Emergency Department (HOSPITAL_COMMUNITY)
Admission: EM | Admit: 2011-11-26 | Discharge: 2011-11-27 | Disposition: A | Discharge status: Home / Self Care | Payer: No Typology Code available for payment source | Attending: Emergency Medicine | Admitting: Emergency Medicine

## 2011-11-26 ENCOUNTER — Emergency Department (HOSPITAL_COMMUNITY): Payer: No Typology Code available for payment source

## 2011-11-26 ENCOUNTER — Encounter (HOSPITAL_COMMUNITY): Payer: Self-pay | Admitting: *Deleted

## 2011-11-26 DIAGNOSIS — I2510 Atherosclerotic heart disease of native coronary artery without angina pectoris: Secondary | ICD-10-CM | POA: Insufficient documentation

## 2011-11-26 DIAGNOSIS — F1010 Alcohol abuse, uncomplicated: Secondary | ICD-10-CM

## 2011-11-26 DIAGNOSIS — R45851 Suicidal ideations: Secondary | ICD-10-CM | POA: Insufficient documentation

## 2011-11-26 DIAGNOSIS — R079 Chest pain, unspecified: Secondary | ICD-10-CM | POA: Insufficient documentation

## 2011-11-26 DIAGNOSIS — R000 Tachycardia, unspecified: Secondary | ICD-10-CM | POA: Insufficient documentation

## 2011-11-26 DIAGNOSIS — IMO0002 Reserved for concepts with insufficient information to code with codable children: Secondary | ICD-10-CM | POA: Insufficient documentation

## 2011-11-26 DIAGNOSIS — E7800 Pure hypercholesterolemia, unspecified: Secondary | ICD-10-CM | POA: Insufficient documentation

## 2011-11-26 DIAGNOSIS — F1910 Other psychoactive substance abuse, uncomplicated: Secondary | ICD-10-CM

## 2011-11-26 DIAGNOSIS — I10 Essential (primary) hypertension: Secondary | ICD-10-CM | POA: Insufficient documentation
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
## 2011-11-27 ENCOUNTER — Inpatient Hospital Stay (HOSPITAL_COMMUNITY)
Admission: RE | Admit: 2011-11-27 | Discharge: 2011-12-01 | DRG: 885 | Disposition: A | Discharge status: Home / Self Care | Payer: Federal, State, Local not specified - Other | Attending: Psychiatry | Admitting: Psychiatry

## 2011-11-27 ENCOUNTER — Encounter (HOSPITAL_COMMUNITY): Payer: Self-pay | Admitting: *Deleted

## 2011-11-27 ENCOUNTER — Telehealth (HOSPITAL_COMMUNITY): Payer: Self-pay

## 2011-11-27 DIAGNOSIS — F1010 Alcohol abuse, uncomplicated: Secondary | ICD-10-CM

## 2011-11-27 DIAGNOSIS — F1910 Other psychoactive substance abuse, uncomplicated: Secondary | ICD-10-CM

## 2011-11-27 DIAGNOSIS — F17200 Nicotine dependence, unspecified, uncomplicated: Secondary | ICD-10-CM | POA: Diagnosis present

## 2011-11-27 DIAGNOSIS — F1920 Other psychoactive substance dependence, uncomplicated: Secondary | ICD-10-CM | POA: Diagnosis present

## 2011-11-27 DIAGNOSIS — I10 Essential (primary) hypertension: Secondary | ICD-10-CM | POA: Diagnosis present

## 2011-11-27 DIAGNOSIS — Z951 Presence of aortocoronary bypass graft: Secondary | ICD-10-CM

## 2011-11-27 DIAGNOSIS — I25810 Atherosclerosis of coronary artery bypass graft(s) without angina pectoris: Secondary | ICD-10-CM | POA: Diagnosis present

## 2011-11-27 DIAGNOSIS — F329 Major depressive disorder, single episode, unspecified: Principal | ICD-10-CM | POA: Diagnosis present

## 2011-11-27 DIAGNOSIS — F603 Borderline personality disorder: Secondary | ICD-10-CM | POA: Diagnosis present

## 2011-11-27 DIAGNOSIS — E785 Hyperlipidemia, unspecified: Secondary | ICD-10-CM

## 2011-11-27 DIAGNOSIS — F1020 Alcohol dependence, uncomplicated: Secondary | ICD-10-CM | POA: Diagnosis present

## 2011-11-27 DIAGNOSIS — K219 Gastro-esophageal reflux disease without esophagitis: Secondary | ICD-10-CM

## 2011-11-27 DIAGNOSIS — I2510 Atherosclerotic heart disease of native coronary artery without angina pectoris: Secondary | ICD-10-CM | POA: Diagnosis present

## 2011-11-27 LAB — RAPID URINE DRUG SCREEN, HOSP PERFORMED
Amphetamines: NOT DETECTED
Barbiturates: NOT DETECTED
Benzodiazepines: NOT DETECTED
Cocaine: POSITIVE — AB
Opiates: NOT DETECTED
Tetrahydrocannabinol: POSITIVE — AB

## 2011-11-27 LAB — CBC
MCH: 33.5 pg (ref 26.0–34.0)
Platelets: 296 10*3/uL (ref 150–400)
RBC: 5.17 MIL/uL (ref 4.22–5.81)
RDW: 14.1 % (ref 11.5–15.5)
WBC: 8.4 10*3/uL (ref 4.0–10.5)

## 2011-11-27 LAB — COMPREHENSIVE METABOLIC PANEL
ALT: 27 U/L (ref 0–53)
AST: 31 U/L (ref 0–37)
Albumin: 4.1 g/dL (ref 3.5–5.2)
CO2: 19 mEq/L (ref 19–32)
Calcium: 9.6 mg/dL (ref 8.4–10.5)
GFR calc non Af Amer: >90 mL/min (ref >90)
Sodium: 136 mEq/L (ref 135–145)

## 2011-11-27 MED ORDER — OXYCODONE-ACETAMINOPHEN 5-325 MG PO TABS
2.0000 | ORAL_TABLET | Freq: Once | ORAL | Status: AC
  Administered 2011-11-27: 2 via ORAL
  Filled 2011-11-27: qty 2

## 2011-11-27 MED ORDER — VITAMIN B-1 100 MG PO TABS
100.0000 mg | ORAL_TABLET | Freq: Every day | ORAL | Status: DC
  Filled 2011-11-27: qty 1

## 2011-11-27 MED ORDER — CHLORDIAZEPOXIDE HCL 25 MG PO CAPS
25.0000 mg | ORAL_CAPSULE | Freq: Four times a day (QID) | ORAL | Status: AC | PRN
  Administered 2011-11-30: 25 mg via ORAL
  Filled 2011-11-27: qty 1

## 2011-11-27 MED ORDER — ONDANSETRON 4 MG PO TBDP: 4.0000 mg | ORAL_TABLET | Freq: Four times a day (QID) | ORAL | Status: DC | PRN

## 2011-11-27 MED ORDER — ONDANSETRON 4 MG PO TBDP
4.0000 mg | ORAL_TABLET | Freq: Four times a day (QID) | ORAL | Status: AC | PRN
  Administered 2011-11-28 (×2): 4 mg via ORAL
  Filled 2011-11-27: qty 1

## 2011-11-27 MED ORDER — ALUM & MAG HYDROXIDE-SIMETH 200-200-20 MG/5ML PO SUSP: 30.0000 mL | ORAL | Status: DC | PRN

## 2011-11-27 MED ORDER — OXYCODONE-ACETAMINOPHEN 5-325 MG PO TABS
1.0000 | ORAL_TABLET | Freq: Once | ORAL | Status: AC
  Administered 2011-11-27: 1 via ORAL
  Filled 2011-11-27: qty 1

## 2011-11-27 MED ORDER — ACETAMINOPHEN 325 MG PO TABS
650.0000 mg | ORAL_TABLET | Freq: Four times a day (QID) | ORAL | Status: DC | PRN
  Administered 2011-11-27 – 2011-11-28 (×2): 650 mg via ORAL

## 2011-11-27 MED ORDER — LORAZEPAM 1 MG PO TABS: 1.0000 mg | ORAL_TABLET | Freq: Four times a day (QID) | ORAL | Status: DC | PRN

## 2011-11-27 MED ORDER — CHLORDIAZEPOXIDE HCL 25 MG PO CAPS
25.0000 mg | ORAL_CAPSULE | ORAL | Status: AC
  Administered 2011-11-30 – 2011-12-01 (×2): 25 mg via ORAL
  Filled 2011-11-27: qty 1

## 2011-11-27 MED ORDER — CHLORDIAZEPOXIDE HCL 25 MG PO CAPS
25.0000 mg | ORAL_CAPSULE | Freq: Four times a day (QID) | ORAL | Status: DC | PRN
  Filled 2011-11-27: qty 1

## 2011-11-27 MED ORDER — CHLORDIAZEPOXIDE HCL 25 MG PO CAPS
25.0000 mg | ORAL_CAPSULE | Freq: Three times a day (TID) | ORAL | Status: AC
  Administered 2011-11-29 – 2011-11-30 (×3): 25 mg via ORAL
  Filled 2011-11-27 (×3): qty 1

## 2011-11-27 MED ORDER — TRAZODONE HCL 50 MG PO TABS
50.0000 mg | ORAL_TABLET | Freq: Every evening | ORAL | Status: DC | PRN
  Administered 2011-11-27: 50 mg via ORAL
  Filled 2011-11-27: qty 1

## 2011-11-27 MED ORDER — LORAZEPAM 1 MG PO TABS: 2.0000 mg | ORAL_TABLET | Freq: Once | ORAL | Status: DC

## 2011-11-27 MED ORDER — LOPERAMIDE HCL 2 MG PO CAPS
2.0000 mg | ORAL_CAPSULE | ORAL | Status: AC | PRN
  Administered 2011-11-28: 4 mg via ORAL

## 2011-11-27 MED ORDER — ADULT MULTIVITAMIN W/MINERALS CH
1.0000 | ORAL_TABLET | Freq: Every day | ORAL | Status: DC
  Administered 2011-11-27: 21:00:00 via ORAL
  Administered 2011-11-28 – 2011-11-29 (×2): 1 via ORAL
  Filled 2011-11-27 (×5): qty 1

## 2011-11-27 MED ORDER — IBUPROFEN 800 MG PO TABS: 800.0000 mg | ORAL_TABLET | Freq: Three times a day (TID) | ORAL | Status: DC

## 2011-11-27 MED ORDER — VITAMIN B-1 100 MG PO TABS
100.0000 mg | ORAL_TABLET | Freq: Every day | ORAL | Status: DC
  Administered 2011-11-28 – 2011-11-29 (×2): 100 mg via ORAL
  Filled 2011-11-27 (×3): qty 1

## 2011-11-27 MED ORDER — THIAMINE HCL 100 MG/ML IJ SOLN: 100.0000 mg | Freq: Once | INTRAMUSCULAR | Status: DC

## 2011-11-27 MED ORDER — ADULT MULTIVITAMIN W/MINERALS CH
1.0000 | ORAL_TABLET | Freq: Every day | ORAL | Status: DC
  Filled 2011-11-27: qty 1

## 2011-11-27 MED ORDER — FOLIC ACID 1 MG PO TABS
1.0000 mg | ORAL_TABLET | Freq: Every day | ORAL | Status: DC
  Administered 2011-11-27: 1 mg via ORAL
  Filled 2011-11-27 (×2): qty 1

## 2011-11-27 MED ORDER — CHLORDIAZEPOXIDE HCL 25 MG PO CAPS
25.0000 mg | ORAL_CAPSULE | Freq: Four times a day (QID) | ORAL | Status: AC
  Administered 2011-11-27 – 2011-11-29 (×6): 25 mg via ORAL
  Filled 2011-11-27 (×5): qty 1

## 2011-11-27 MED ORDER — THIAMINE HCL 100 MG/ML IJ SOLN: 100.0000 mg | Freq: Every day | INTRAMUSCULAR | Status: DC

## 2011-11-27 MED ORDER — HYDROXYZINE HCL 25 MG PO TABS
25.0000 mg | ORAL_TABLET | Freq: Four times a day (QID) | ORAL | Status: AC | PRN
  Administered 2011-11-28 – 2011-11-30 (×5): 25 mg via ORAL
  Filled 2011-11-27 (×3): qty 1

## 2011-11-27 MED ORDER — LOPERAMIDE HCL 2 MG PO CAPS: 2.0000 mg | ORAL_CAPSULE | ORAL | Status: DC | PRN

## 2011-11-27 MED ORDER — MAGNESIUM HYDROXIDE 400 MG/5ML PO SUSP: 30.0000 mL | Freq: Every day | ORAL | Status: DC | PRN

## 2011-11-27 MED ORDER — IBUPROFEN 800 MG PO TABS
800.0000 mg | ORAL_TABLET | Freq: Three times a day (TID) | ORAL | Status: DC | PRN
  Administered 2011-11-27 – 2011-11-28 (×3): 800 mg via ORAL
  Filled 2011-11-27 (×3): qty 1

## 2011-11-27 MED ORDER — CHLORDIAZEPOXIDE HCL 25 MG PO CAPS
25.0000 mg | ORAL_CAPSULE | Freq: Every day | ORAL | Status: DC
  Filled 2011-11-27: qty 1

## 2011-11-27 MED ORDER — HYDROXYZINE HCL 25 MG PO TABS: 25.0000 mg | ORAL_TABLET | Freq: Four times a day (QID) | ORAL | Status: DC | PRN

## 2011-11-27 MED ORDER — LORAZEPAM 2 MG/ML IJ SOLN
1.0000 mg | Freq: Four times a day (QID) | INTRAMUSCULAR | Status: DC | PRN
  Administered 2011-11-27 (×2): 1 mg via INTRAVENOUS
  Filled 2011-11-27 (×2): qty 1

## 2011-11-27 MED ORDER — VITAMIN B-1 100 MG PO TABS
100.0000 mg | ORAL_TABLET | Freq: Every day | ORAL | Status: DC
  Administered 2011-11-27: 100 mg via ORAL
  Filled 2011-11-27 (×2): qty 1

## 2011-11-27 MED ORDER — SODIUM CHLORIDE 0.9 % IV BOLUS (SEPSIS)
1000.0000 mL | Freq: Once | INTRAVENOUS | Status: AC
  Administered 2011-11-27: 1000 mL via INTRAVENOUS

## 2011-11-27 MED ORDER — ADULT MULTIVITAMIN W/MINERALS CH
1.0000 | ORAL_TABLET | Freq: Every day | ORAL | Status: DC
  Administered 2011-11-27: 1 via ORAL
  Filled 2011-11-27 (×2): qty 1

## 2011-11-27 MED ORDER — OXYCODONE-ACETAMINOPHEN 5-325 MG PO TABS: 1.0000 | ORAL_TABLET | ORAL | Status: DC | PRN

## 2011-11-27 NOTE — ED Provider Notes (Signed)
2:29 PM The patient was seen and evaluated the psychiatrist and the psychiatrist is probably the patient did threat to himself or to others.  The patient is safe to be discharged home from a mental health standpoint.  The patient is requesting help with substance abuse.  My ACT team has provided the patient with resources.   Lyanne Co, MD 11/27/11 [phone]
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
## 2011-11-27 NOTE — ED Notes (Signed)
Upon discharge, pt was not happy that he was still having pain in his ribs 9/10.  He stated that he was not evaluated by the MD correctly and says that "I might still be suicidal".  Advised Dr Patria Mane who advised to DC anyway.  Pt was given referrals [facility] detox and treatment along with mental health facilities and also a prescription for Motrin.
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
## 2011-11-27 NOTE — ED Notes (Signed)
Pt. Belonging in locker #43 (1bag)

## 2011-11-27 NOTE — ED Notes (Signed)
MD at bedside. 
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
## 2011-11-27 NOTE — ED Notes (Signed)
States that Percocet will not ease his pain, wants something "stronger in IV"

## 2011-11-27 NOTE — [facility] Counselor (Signed)
Pt. Accepted by Dr. Ann Lions [phone].

## 2011-11-27 NOTE — Tx Team (Signed)
Initial Interdisciplinary Treatment Plan  PATIENT STRENGTHS: (choose at least two) Ability for insight Average or above average intelligence Capable of independent living Communication skills General fund of knowledge Motivation for treatment/growth  PATIENT STRESSORS: Substance abuse   PROBLEM LIST: Problem List/Patient Goals Date to be addressed Date deferred Reason deferred Estimated date of resolution  Substance abuse 11/27/11     Suicidal ideation 11/27/11                                                DISCHARGE CRITERIA:  Ability to meet basic life and health needs Improved stabilization in mood, thinking, and/or behavior Motivation to continue treatment in a less acute level of care Need for constant or close observation no longer present Reduction of life-threatening or endangering symptoms to within safe limits Verbal commitment to aftercare and medication compliance Withdrawal symptoms are absent or subacute and managed without 24-hour nursing intervention  PRELIMINARY DISCHARGE PLAN: Attend aftercare/continuing care group Attend 12-step recovery group Participate in family therapy Return to previous living arrangement Return to previous work or school arrangements  PATIENT/FAMIILY INVOLVEMENT: This treatment plan has been presented to and reviewed with the patient, WINN MUEHL.  The patient and family have been given the opportunity to ask questions and make suggestions.  Hoover Browns 11/27/2011, 7:36 PM

## 2011-11-28 DIAGNOSIS — I10 Essential (primary) hypertension: Secondary | ICD-10-CM

## 2011-11-28 DIAGNOSIS — E785 Hyperlipidemia, unspecified: Secondary | ICD-10-CM

## 2011-11-28 DIAGNOSIS — I25810 Atherosclerosis of coronary artery bypass graft(s) without angina pectoris: Secondary | ICD-10-CM | POA: Diagnosis present

## 2011-11-28 DIAGNOSIS — F1910 Other psychoactive substance abuse, uncomplicated: Secondary | ICD-10-CM

## 2011-11-28 LAB — CARDIAC PANEL(CRET KIN+CKTOT+MB+TROPI)
CK, MB: 2.7 ng/mL (ref 0.3–4.0)
Relative Index: INVALID (ref 0.0–2.5)
Total CK: 74 U/L (ref 7–232)
Troponin I: <0.3 ng/mL (ref <0.30)

## 2011-11-28 MED ORDER — ASPIRIN 325 MG PO TABS: 325.0000 mg | ORAL_TABLET | Freq: Every day | ORAL | Status: DC

## 2011-11-28 MED ORDER — NICOTINE 21 MG/24HR TD PT24
21.0000 mg | MEDICATED_PATCH | Freq: Every day | TRANSDERMAL | Status: DC
  Administered 2011-11-28 – 2011-12-01 (×4): 21 mg via TRANSDERMAL
  Filled 2011-11-28 (×5): qty 1

## 2011-11-28 MED ORDER — ASPIRIN 81 MG PO CHEW
81.0000 mg | CHEWABLE_TABLET | Freq: Every day | ORAL | Status: DC
  Administered 2011-11-28 – 2011-12-01 (×4): 81 mg via ORAL
  Filled 2011-11-28 (×7): qty 1

## 2011-11-28 MED ORDER — ALBUTEROL SULFATE HFA 108 (90 BASE) MCG/ACT IN AERS: 2.0000 | INHALATION_SPRAY | RESPIRATORY_TRACT | Status: DC | PRN

## 2011-11-28 MED ORDER — TRAZODONE HCL 100 MG PO TABS
100.0000 mg | ORAL_TABLET | Freq: Every evening | ORAL | Status: DC | PRN
  Administered 2011-11-28 – 2011-11-30 (×3): 100 mg via ORAL
  Filled 2011-11-28: qty 1
  Filled 2011-11-28: qty 14
  Filled 2011-11-28 (×2): qty 1

## 2011-11-28 MED ORDER — PREDNISONE 20 MG PO TABS
40.0000 mg | ORAL_TABLET | Freq: Every day | ORAL | Status: DC
  Administered 2011-11-28 – 2011-12-01 (×3): 40 mg via ORAL
  Filled 2011-11-28: qty 2
  Filled 2011-11-28: qty 4
  Filled 2011-11-28 (×3): qty 2

## 2011-11-28 MED ORDER — METOPROLOL TARTRATE 50 MG PO TABS
50.0000 mg | ORAL_TABLET | Freq: Two times a day (BID) | ORAL | Status: DC
  Administered 2011-11-28: 50 mg via ORAL
  Filled 2011-11-28 (×3): qty 1

## 2011-11-28 MED ORDER — IBUPROFEN 800 MG PO TABS
800.0000 mg | ORAL_TABLET | Freq: Four times a day (QID) | ORAL | Status: DC | PRN
  Administered 2011-11-29 (×2): 800 mg via ORAL
  Filled 2011-11-28: qty 4
  Filled 2011-11-28: qty 1

## 2011-11-28 MED ORDER — HYDRALAZINE HCL 25 MG PO TABS
25.0000 mg | ORAL_TABLET | Freq: Three times a day (TID) | ORAL | Status: DC | PRN
  Filled 2011-11-28: qty 1

## 2011-11-28 MED ORDER — LISINOPRIL 20 MG PO TABS
20.0000 mg | ORAL_TABLET | Freq: Every day | ORAL | Status: DC
  Administered 2011-11-28 – 2011-12-01 (×6): 20 mg via ORAL
  Filled 2011-11-28 (×7): qty 1

## 2011-11-28 MED ORDER — PREDNISONE 20 MG PO TABS: 40.0000 mg | ORAL_TABLET | Freq: Every day | ORAL | Status: DC

## 2011-11-28 MED ORDER — ATORVASTATIN CALCIUM 10 MG PO TABS
10.0000 mg | ORAL_TABLET | Freq: Every day | ORAL | Status: DC
  Administered 2011-11-28 – 2011-11-30 (×3): 10 mg via ORAL
  Filled 2011-11-28 (×5): qty 1

## 2011-11-28 NOTE — Progress Notes (Signed)
Patient ID: Steven [name], male   DOB: April 20, 1971, 41 y.o.   MRN: [id number]  D:  Pt informed the writer that he was feeling a "lil anxious", and asked if he had some medicines. Pt stated his alcohol consumption increased over the past 2 wks. "I been drinking a 12 pk a day for 10 yrs. But for the last 4 days I've been drinking 2.5 cases.  Writer noticed that pt didn't have librium protocol, and contacted on call Dr. Dr informed Clinical research associate that she'd given an order for the librium protocol. The writer went to assessment and spoke with RN that placed the orders.  A: Pt was given scheduled librium and 15 min checks were continued.  R: Pt remains safe.

## 2011-11-28 NOTE — Progress Notes (Addendum)
[facility] Group Notes:  (Counselor/Nursing/MHT/Case Management/Adjunct)  11/28/2011 5:37 PM  Type of Therapy:  Group Therapy  Participation Level:  Minimal  Participation Quality:  Attentive  Affect:  Appropriate  Cognitive:  Alert  Insight:  None shared  Engagement in Group:  None  Engagement in Therapy:  None  Modes of Intervention:  Socialization and Support  Summary of Progress/Problems:  Steven [name] attended last 20-30 minutes of group processing session in which others shared their feelings about labels and diagnosis.  Steven [name] was minimally attentive to other group members sharing.    Clide Dales 11/28/2011, 5:37 PM
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
## 2011-11-28 NOTE — Progress Notes (Signed)
Patient ID: Steven [name], male   DOB: March 15, 1971, 41 y.o.   MRN: [id number] He has been in bed except for meals and medication. Had requested and received prn's for withdrawal symptoms. Says that he feels bad anad just wants to sleep.

## 2011-11-28 NOTE — Progress Notes (Signed)
Psychoeducational Group Note  Date:  11/28/2011 Time:  1100  Group Topic/Focus:  Recovery Goals:   The focus of this group is to identify appropriate goals for recovery and establish a plan to achieve them.  Participation Level:  Did Not Attend  Participation Quality:  Did not attend  Affect:  Did not attend  Cognitive:  Did not attend  Insight:  Did not attend  Engagement in Group:  Did not attend  Additional Comments: Pt participated in the group of Recovery Goals. Pt was appropriate during group and was sharing information on what recovery was and what life was when in recovery. Pt stated two things that are motivated and willing to change. Pt also came up with goals in ways to be positive when times begin to become unhealthy.      Steven [name] 11/28/2011, 6:46 PM
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
## 2011-11-28 NOTE — Discharge Planning (Signed)
New patient asked for help with sending FAX.  Done.  Stated he is here for detox from alcohol.  Also uses cocaine and cannabis.  States he is self medicating due to unrelenting pain due to broken ribs.  Declined referral to rehab.  States he does not want to attend mtgs or groups 8 hours a day.  Likely return home and follow up outpt.

## 2011-11-28 NOTE — H&P (Signed)
Pt seen and evaluated upon admission.  Completed Admission Suicide Risk Assessment.  See orders.  Pt agreeable with plan.  Discussed with team.   

## 2011-11-28 NOTE — Progress Notes (Addendum)
11/28/2011         Time: 1500      Group Topic/Focus: The focus of this group is on discussing various styles of communication and communicating assertively using 'I' (feeling) statements.   Participation Level: Minimal  Participation Quality: Attentive  Affect: Appropriate  Cognitive: Oriented   Additional Comments: Patient late to group, then walked in/out several more times before settling in and staying the last fifteen minutes.    Casimir Barcellos 11/28/2011 3:52 PM Additional Comments: None.

## 2011-11-28 NOTE — Progress Notes (Signed)
[facility] Group Notes:  (Counselor/Nursing/MHT/Case Management/Adjunct)  11/28/2011 2:23 PM  Type of Therapy:  Psychoeducational Skills  Participation Level:  Did Not Attend   Summary of Progress/Problems: Steven [name] c/o feeling sick did not attend Psychoeducational group on labels.   [name] 11/28/2011, 2:23 PM

## 2011-11-29 DIAGNOSIS — F1020 Alcohol dependence, uncomplicated: Secondary | ICD-10-CM

## 2011-11-29 MED ORDER — AMLODIPINE BESYLATE 5 MG PO TABS
5.0000 mg | ORAL_TABLET | Freq: Every day | ORAL | Status: DC
  Administered 2011-11-29 – 2011-12-01 (×3): 5 mg via ORAL
  Filled 2011-11-29 (×5): qty 1

## 2011-11-29 MED ORDER — NAPROXEN 500 MG PO TABS
500.0000 mg | ORAL_TABLET | Freq: Three times a day (TID) | ORAL | Status: DC
  Administered 2011-11-29 – 2011-12-01 (×5): 500 mg via ORAL
  Filled 2011-11-29 (×10): qty 1

## 2011-11-29 MED ORDER — VITAMIN B-1 100 MG PO TABS
100.0000 mg | ORAL_TABLET | Freq: Every day | ORAL | Status: DC
  Administered 2011-11-30 – 2011-12-01 (×2): 100 mg via ORAL
  Filled 2011-11-29 (×3): qty 1

## 2011-11-29 MED ORDER — METOPROLOL TARTRATE 12.5 MG HALF TABLET: 12.5000 mg | ORAL_TABLET | Freq: Two times a day (BID) | ORAL | Status: DC

## 2011-11-29 MED ORDER — ADULT MULTIVITAMIN W/MINERALS CH
1.0000 | ORAL_TABLET | Freq: Every day | ORAL | Status: DC
  Administered 2011-11-30 – 2011-12-01 (×2): 1 via ORAL
  Filled 2011-11-29 (×3): qty 1

## 2011-11-29 MED ORDER — NAPROXEN 500 MG PO TABS
500.0000 mg | ORAL_TABLET | Freq: Two times a day (BID) | ORAL | Status: DC | PRN
  Administered 2011-11-29: 500 mg via ORAL
  Filled 2011-11-29: qty 1
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
## 2011-11-29 NOTE — Progress Notes (Signed)
Patient ID: Steven [name], male   DOB: October 17, 1970, 41 y.o.   MRN: [id number] He has  Been up and  About went to part of the groups , interacting with peers and staff. He has bee irritable today requesting stronger pain  Medication then what is ordered"Stated I need to leave so I can take care of this pain."  He signed the 72 hour request for discharge at  09::45 today. Also said that he lied about being su cidal  just to get  To get into here  So that he could get help for substance abuse.
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
## 2011-11-29 NOTE — Progress Notes (Signed)
Psychoeducational Group Note  Date:  11/29/2011 Time:  1100  Group Topic/Focus:  Coping With Mental Health Crisis:   The purpose of this group is to help patients identify strategies for coping with mental health crisis.  Group discusses possible causes of crisis and ways to manage them effectively.  Participation Level:  Minimal  Participation Quality:  Inattentive  Affect:  Appropriate  Cognitive:  Appropriate  Insight:  Limited  Engagement in Group:  Limited  Additional Comments:  Pt walked in and out of group several times. While other pts were filling out their worksheet pt was complaining for of pain with his broken ribs. Pt was directed to the topic of the group. Pt then attempted to fill out worksheets.Pt also stated that his pain is what caused him to go into a crisis and when the pain is under control he would be fine.   Sharyn Lull 11/29/2011, 1:56 PM

## 2011-11-29 NOTE — Treatment Plan (Signed)
Interdisciplinary Treatment Plan Update (Adult)  Date: 11/29/2011  Time Reviewed: 11:57 AM   Progress in Treatment: Attending groups: Yes Participating in groups: Yes Taking medication as prescribed: Yes Tolerating medication: Yes   Family/Significant other contact made:  No Patient understands diagnosis:  Yes  As evidenced by asking for help with on-going addiction issues, alcohol withdrawal Discussing patient identified problems/goals with staff:  Yes  See below Medical problems stabilized or resolved:  Yes Denies suicidal/homicidal ideation: Yes In AM group Issues/concerns per patient self-inventory:  Yes  C/O pain from broken ribs Other:  New problem(s) identified: N/A  Reason for Continuation of Hospitalization: Medication stabilization Withdrawal symptoms  Interventions implemented related to continuation of hospitalization: Librium taper,  Assess for psychotropics that may be helpful with mood  Encourage group attendance and participation  Address rib pain with non-narcotic medication  Additional comments:  Estimated length of stay: 3-4 days  Discharge Plan:unknown  New goal(s): N/A  Review of initial/current patient goals per problem list:    1.  Goal(s):Safely detox from alcohol  Met:  No  Target date:7/19  As evidenced ZO:[id number] vitals, no withdrawal symptoms  2.  Goal (s): Eliminate SI  Met:  Yes  Target date:7/17  As evidenced [id number] today  3.  Goal(s): Identify comprehensive sobriety plan  Met:  No  Target date:7/19  As evidenced JY:NWGN report  4.  Goal(s):  Met:  No  Target date:  As evidenced by:  Attendees: Patient:     Family:     Physician:  Steven [name] 11/29/2011 11:57 AM   Nursing:    11/29/2011 11:57 AM   Case Manager:  Richelle Ito, LCSW 11/29/2011 11:57 AM   Counselor:   11/29/2011 11:57 AM   Other:     Other:     Other:     Other:      Scribe for Treatment Team:   Ida Rogue, 11/29/2011 11:57 AM

## 2011-11-29 NOTE — Progress Notes (Signed)
D: Pt denies SI/HI/AVH. Pt anxious and complaining of rib pain throughout the shift. Pt assertive and aggressive. Pt experienced some nausea throughout the night. A: Support and encouragement offered to pt. Pt medicated for pain, anxiety, and nausea. Internal Medicine doctor came to see pt and placed orders for pain management and physiological systems. Continue Q15 min checks for safety. R: Pt receptive, however continues to have issues with pain. Nausea relieved. Anxiety reduced. Pt remains safe on unit.

## 2011-11-29 NOTE — Progress Notes (Signed)
Steven [name] is a 41 y.o. male admitted with substance abuse. Hospitalist service following him for uncontrolled Htn. He has not seen a PCP in a number of years, and has not been on cardiovascular meds, despite CABG hx. He plans to follow with a PCP in [place]. I would discharge him on lisinopril and norvasc, and let his PCP optimize the meds outpatient. Feels ok. Complaints of right rib pain.  1. CAD (coronary artery disease) of bypass graft   2. Other and unspecified hyperlipidemia   3. Other, mixed, or unspecified nondependent drug abuse, unspecified   4. Unspecified essential hypertension     Past Medical History  Diagnosis Date  . Coronary artery disease   . Hypertension   . Hypercholesteremia    Current Facility-Administered Medications  Medication Dose Route Frequency Provider Last Rate Last Dose  . albuterol (PROVENTIL HFA;VENTOLIN HFA) 108 (90 BASE) MCG/ACT inhaler 2 puff  2 puff Inhalation Q4H PRN Joseph Art, DO      . alum & mag hydroxide-simeth (MAALOX/MYLANTA) 200-200-20 MG/5ML suspension 30 mL  30 mL Oral Q4H PRN Alyson Kuroski-Mazzei, DO      . amLODipine (NORVASC) tablet 5 mg  5 mg Oral Daily Lekha Dancer, MD      . aspirin chewable tablet 81 mg  81 mg Oral Daily Alyson Kuroski-Mazzei, DO   81 mg at 11/29/11 0815  . atorvastatin (LIPITOR) tablet 10 mg  10 mg Oral q1800 Alyson Kuroski-Mazzei, DO   10 mg at 11/28/11 1729  . chlordiazePOXIDE (LIBRIUM) capsule 25 mg  25 mg Oral Q6H PRN Alyson Kuroski-Mazzei, DO      . chlordiazePOXIDE (LIBRIUM) capsule 25 mg  25 mg Oral QID Alyson Kuroski-Mazzei, DO   25 mg at 11/29/11 0815   Followed by  . chlordiazePOXIDE (LIBRIUM) capsule 25 mg  25 mg Oral TID Alyson Kuroski-Mazzei, DO   25 mg at 11/29/11 1211   Followed by  . chlordiazePOXIDE (LIBRIUM) capsule 25 mg  25 mg Oral BH-qamhs Alyson Kuroski-Mazzei, DO       Followed by  . chlordiazePOXIDE (LIBRIUM) capsule 25 mg  25 mg Oral Daily Alyson Kuroski-Mazzei, DO      .  hydrALAZINE (APRESOLINE) tablet 25 mg  25 mg Oral Q8H PRN Joseph Art, DO      . hydrOXYzine (ATARAX/VISTARIL) tablet 25 mg  25 mg Oral Q6H PRN Alyson Kuroski-Mazzei, DO   25 mg at 11/29/11 0819  . lisinopril (PRINIVIL,ZESTRIL) tablet 20 mg  20 mg Oral Daily Joseph Art, DO   20 mg at 11/29/11 0816  . loperamide (IMODIUM) capsule 2-4 mg  2-4 mg Oral PRN Alyson Kuroski-Mazzei, DO   4 mg at 11/28/11 0836  . magnesium hydroxide (MILK OF MAGNESIA) suspension 30 mL  30 mL Oral Daily PRN Alyson Kuroski-Mazzei, DO      . multivitamin with minerals tablet 1 tablet  1 tablet Oral Daily Alyson Kuroski-Mazzei, DO      . naproxen (NAPROSYN) tablet 500 mg  500 mg Oral BID PRN Alyson Kuroski-Mazzei, DO      . nicotine (NICODERM CQ - dosed in mg/24 hours) patch 21 mg  21 mg Transdermal Q0600 Alyson Kuroski-Mazzei, DO   21 mg at 11/29/11 0700  . ondansetron (ZOFRAN-ODT) disintegrating tablet 4 mg  4 mg Oral Q6H PRN Alyson Kuroski-Mazzei, DO   4 mg at 11/28/11 2357  . predniSONE (DELTASONE) tablet 40 mg  40 mg Oral Q breakfast Jessica U Vann, DO   40 mg at  11/28/11 1941  . thiamine (B-1) injection 100 mg  100 mg Intramuscular Once [employer], DO      . thiamine (VITAMIN B-1) tablet 100 mg  100 mg Oral Daily Alyson Kuroski-Mazzei, DO      . traZODone (DESYREL) tablet 100 mg  100 mg Oral QHS PRN Alyson Kuroski-Mazzei, DO   100 mg at 11/28/11 2125  . DISCONTD: acetaminophen (TYLENOL) tablet 650 mg  650 mg Oral Q6H PRN Alyson Kuroski-Mazzei, DO   650 mg at 11/28/11 0651  . DISCONTD: aspirin tablet 325 mg  325 mg Oral Daily Alyson Kuroski-Mazzei, DO      . DISCONTD: ibuprofen (ADVIL,MOTRIN) tablet 800 mg  800 mg Oral Q8H PRN Alyson Kuroski-Mazzei, DO   800 mg at 11/28/11 1543  . DISCONTD: ibuprofen (ADVIL,MOTRIN) tablet 800 mg  800 mg Oral Q6H PRN Alyson Kuroski-Mazzei, DO   800 mg at 11/29/11 6213  . DISCONTD: metoprolol (LOPRESSOR) tablet 50 mg  50 mg Oral BID Alyson Kuroski-Mazzei, DO   50 mg at  11/28/11 1729  . DISCONTD: metoprolol tartrate (LOPRESSOR) tablet 12.5 mg  12.5 mg Oral BID Najla Aughenbaugh, MD      . DISCONTD: multivitamin with minerals tablet 1 tablet  1 tablet Oral Daily Alyson Kuroski-Mazzei, DO   1 tablet at 11/29/11 0815  . DISCONTD: predniSONE (DELTASONE) tablet 40 mg  40 mg Oral Q breakfast Joseph Art, DO      . DISCONTD: thiamine (VITAMIN B-1) tablet 100 mg  100 mg Oral Daily Alyson Kuroski-Mazzei, DO   100 mg at 11/29/11 0815  . DISCONTD: traZODone (DESYREL) tablet 50 mg  50 mg Oral QHS PRN Alyson Kuroski-Mazzei, DO   50 mg at 11/27/11 2127   No Known Allergies Active Problems:  SUBSTANCE ABUSE  HYPERTENSION  CAD (coronary artery disease) of bypass graft   Vital signs in last 24 hours: Temp:  [97.9 F (36.6 C)-98.2 F (36.8 C)] 97.9 F (36.6 C) (07/17 1209) Pulse Rate:  [61-97] 84  (07/17 1209) Resp:  [16-18] 16  (07/17 1209) BP: (108-172)/(73-115) 131/78 mmHg (07/17 1209) Weight change:     Intake/Output from previous day:   Intake/Output this shift:    Lab Results:  Basename 11/26/11 2350  WBC 8.4  HGB 17.3*  HCT 48.5  PLT 296   BMET  Basename 11/26/11 2350  NA 136  K 3.5  CL 99  CO2 19  GLUCOSE 119*  BUN 8  CREATININE 0.93  CALCIUM 9.6    Studies/Results: No results found.  Medications: I have reviewed the patient's current medications.   Physical exam GENERAL- alert HEAD- normal atraumatic, no neck masses, normal thyroid, no jvd RESPIRATORY- appears well, vitals normal, no respiratory distress, acyanotic, normal RR, ear and throat exam is normal, neck free of mass or lymphadenopathy, chest clear, no wheezing, crepitations, rhonchi, normal symmetric air entry CVS- regular rate and rhythm, S1, S2 normal, no murmur, click, rub or gallop ABDOMEN- abdomen is soft without significant tenderness, masses, organomegaly or guarding NEURO- Grossly normal EXTREMITIES- extremities normal, atraumatic, no cyanosis or  edema  Recommendations  *  HYPERTENSION- trending towards normal. Added norvasc. Would d/c on norvasc/lisinopril. * SUBSTANCE ABUSE- cocaine use restricts beta blocker use. * CAD (coronary artery disease) of bypass graft- continue asa/statin/norvasc/acei. Follow PCP outpatient.  Thank you very much for this consult. Not much else to add from a medical standpoint. We will sign off. Please feel free to call with questions.    Kimari Coudriet 11/29/2011 12:18 PM Pager:  [phone].    

## 2011-11-30 MED ORDER — DIVALPROEX SODIUM ER 500 MG PO TB24
500.0000 mg | ORAL_TABLET | Freq: Every day | ORAL | Status: DC
  Administered 2011-11-30 – 2011-12-01 (×2): 500 mg via ORAL
  Filled 2011-11-30 (×5): qty 1

## 2011-11-30 NOTE — Discharge Planning (Signed)
11/30/2011 11:35 AM  SW met with pt. in discharge planning group.  Pt expressed concerns about wanting to go home today, but having several issues with his ribs and no definitely plan to address substance use issues in addition to maybe attending AA meetings.  Pt stated he will try to take things one day at a time, "I am not thinking about using today, but maybe tomorrow."  SW will continue to assess for referrals.  Clarice Pole, LCASA 11/30/2011, 11:38 AM

## 2011-11-30 NOTE — Progress Notes (Signed)
Psychoeducational Group Note  Date:  7/17/20313 Time:  2000  Group Topic/Focus:  AA  Participation Level:  Active  Participation Quality:  Appropriate  Affect:  Appropriate  Cognitive:  Alert  Insight:  Good  Engagement in Group:  Good  Additional Comments:  Pt attended and participated in AA this evening.  [name] R 11/30/2011, 12:39 AM

## 2011-11-30 NOTE — Progress Notes (Signed)
Patient ID: Steven [name], male   DOB: 09-14-1970, 41 y.o.   MRN: [id number] He has been un and about interacting with peers and staff. Has attended groups. Has been wanting to leave today and gets agitated when told he will be seen later. He wants to be seen NOW.

## 2011-11-30 NOTE — Progress Notes (Signed)
Psychoeducational Group Note  Date:  11/30/2011 Time:  1100  Group Topic/Focus:  Building Self Esteem:   The Focus of this group is helping patients become aware of the effects of self-esteem on their lives, the things they and others do that enhance or undermine their self-esteem, seeing the relationship between their level of self-esteem and the choices they make and learning ways to enhance self-esteem.  Participation Level:  Active  Participation Quality:  Appropriate, Attentive and Supportive  Affect:  Appropriate  Cognitive:  Alert and Appropriate  Insight:  Good  Engagement in Group:  Good  Additional Comments:  Pt was very active and appropriate while attending group. Pt shared that one good quality about himself was that he was loyal.  Sharyn Lull 11/30/2011, 2:15 PM

## 2011-11-30 NOTE — Progress Notes (Signed)
[facility] Adult Inpatient Family/Significant Other Suicide Prevention Education  Suicide Prevention Education:  Contact Attempts: Kerman Pfost (GM) at [phone]) has been identified by the patient as the family member/significant other with whom the patient will be residing, and identified as the person(s) who will aid the patient in the event of a mental health crisis.  With written consent from the patient, two attempts were made to provide suicide prevention education, prior to and/or following the patient's discharge.  We were unsuccessful in providing suicide prevention education.  A suicide education pamphlet was given to the patient to share with family/significant other.  Date and time of first attempt: 11/30/2011 / 3:10 Date and time of second attempt: 11/30/2011 / 5:22 PM  Message on phone states voice mail is full  Clide Dales 11/30/2011, 5:21 PM
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
## 2011-11-30 NOTE — Progress Notes (Signed)
[facility] MD Progress Note  11/30/2011 3:31 PM  S/O: Patient seen and evaluated. Chart reviewed. Patient stated that his mood was "better". His affect was mood congruent, yet loose. He denied any current thoughts of self injurious behavior, suicidal ideation or homicidal ideation. There were no auditory or visual hallucinations, paranoia, delusional thought processes, or mania noted. Thought process was expansive and circumstantial. No psychomotor agitation or retardation was noted. His speech was normal rate, tone and volume. Eye contact was good. Judgment and insight are limited. Patient has been up and engaged on the unit. No acute safety concerns reported from team.   Sleep:  Number of Hours: 6    Vital Signs:Blood pressure 119/77, pulse 102, temperature 97.7 F (36.5 C), temperature source Oral, resp. rate 18, height 5' 8.75" (1.746 m), weight 83.462 kg (184 lb), SpO2 94.00%.  Current Medications:    . amLODipine  5 mg Oral Daily  . aspirin  81 mg Oral Daily  . atorvastatin  10 mg Oral q1800  . chlordiazePOXIDE  25 mg Oral TID   Followed by  . chlordiazePOXIDE  25 mg Oral BH-qamhs   Followed by  . chlordiazePOXIDE  25 mg Oral Daily  . lisinopril  20 mg Oral Daily  . multivitamin with minerals  1 tablet Oral Daily  . naproxen  500 mg Oral TID WC  . nicotine  21 mg Transdermal Q0600  . predniSONE  40 mg Oral Q breakfast  . thiamine  100 mg Intramuscular Once  . thiamine  100 mg Oral Daily    Lab Results:  Results for orders placed during the hospital encounter of 11/27/11 (from the past 48 hour(s))  CARDIAC PANEL(CRET KIN+CKTOT+MB+TROPI)     Status: Normal   Collection Time   11/28/11  7:41 PM      Component Value Range Comment   Total CK 74  7 - 232 U/L    CK, MB 2.7  0.3 - 4.0 ng/mL    Troponin I <0.30  <0.30 ng/mL    Relative Index RELATIVE INDEX IS INVALID  0.0 - 2.5     Physical Findings: AIMS: Facial and Oral Movements Muscles of Facial Expression: None, normal Lips and  Perioral Area: None, normal Jaw: None, normal Tongue: None, normal,Extremity Movements Upper (arms, wrists, hands, fingers): None, normal Lower (legs, knees, ankles, toes): None, normal, Trunk Movements Neck, shoulders, hips: None, normal, Overall Severity Severity of abnormal movements (highest score from questions above): None, normal Incapacitation due to abnormal movements: None, normal Patient's awareness of abnormal movements (rate only patient's report): No Awareness, Dental Status Current problems with teeth and/or dentures?: No Does patient usually wear dentures?: No  CIWA:  CIWA-Ar Total: 0  COWS:  COWS Total Score: 5   Plan: Polysubstance Dependence; Alcohol W/D; TBI; HL; HTN; s/p CABG 2012; SIMD  Pt seen and evaluated.  Reviewed short term and long term goals, medications, current treatment in the hospital and acute/chronic safety.  Pt denied any current thoughts of self harm, suicidal ideation or homicidal ideation.  Contracted for safety on the unit.  No acute issues noted.  VS reviewed with team.  Pt agreed to start Depakote for mood stability and disinhibition s/p TBI. Pt agreeable with treatment plan, see orders. Potential discharge in am with upward titration of medication by outpt team if stable.  Pt anxious to be discharged on 7.19.13  Discussed with team.  Lupe Carney 11/30/2011, 3:31 PM

## 2011-11-30 NOTE — Progress Notes (Addendum)
D: Pt denies SI/HI/AVH. Pt rates his depression and hopelessness as 0 and his anxiety as 10. Pt states his anxiety comes from a fear of failure with remaining sober. Pt is anxious also about returning home. He's nervous that something will happen causing him not to go. A: Support and encouragement offered to pt. Advised him that he has the necessary tools to remain sober and if he works the plan and attends his meetings it will help him remain compliant. Q15 min checks continued for safety. R: Pt receptive. Pt remains safe on unit.
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
## 2011-12-01 DIAGNOSIS — F332 Major depressive disorder, recurrent severe without psychotic features: Secondary | ICD-10-CM

## 2011-12-01 DIAGNOSIS — F1010 Alcohol abuse, uncomplicated: Secondary | ICD-10-CM

## 2011-12-01 MED ORDER — NAPROXEN 500 MG PO TABS: 500.0000 mg | ORAL_TABLET | Freq: Two times a day (BID) | ORAL | Status: DC | PRN

## 2011-12-01 MED ORDER — TRAZODONE HCL 100 MG PO TABS: 100.0000 mg | ORAL_TABLET | Freq: Every evening | ORAL | Status: DC | PRN

## 2011-12-01 MED ORDER — AMLODIPINE BESYLATE 5 MG PO TABS: 5.0000 mg | ORAL_TABLET | Freq: Every day | ORAL | Status: DC

## 2011-12-01 MED ORDER — ATORVASTATIN CALCIUM 10 MG PO TABS: 10.0000 mg | ORAL_TABLET | Freq: Every day | ORAL | Status: DC

## 2011-12-01 MED ORDER — DIVALPROEX SODIUM ER 500 MG PO TB24: 500.0000 mg | ORAL_TABLET | Freq: Every day | ORAL | Status: DC

## 2011-12-01 MED ORDER — LISINOPRIL 20 MG PO TABS: 20.0000 mg | ORAL_TABLET | Freq: Every day | ORAL | Status: DC

## 2011-12-01 MED ORDER — PREDNISONE 10 MG PO TABS: ORAL_TABLET | ORAL | Status: DC

## 2011-12-01 MED ORDER — NAPROXEN 500 MG PO TABS
500.0000 mg | ORAL_TABLET | Freq: Two times a day (BID) | ORAL | Status: DC | PRN
  Filled 2011-12-01: qty 28

## 2011-12-01 MED ORDER — ASPIRIN 81 MG PO CHEW: 81.0000 mg | CHEWABLE_TABLET | Freq: Every day | ORAL | Status: DC

## 2011-12-01 MED ORDER — PREDNISONE 10 MG PO TABS
10.0000 mg | ORAL_TABLET | ORAL | Status: DC
  Filled 2011-12-01: qty 6

## 2011-12-01 MED ORDER — IBUPROFEN 200 MG PO TABS
400.0000 mg | ORAL_TABLET | Freq: Four times a day (QID) | ORAL | Status: DC | PRN
Start: 2011-12-01 — End: 2011-12-01
  Administered 2011-12-01: 400 mg via ORAL
  Filled 2011-12-01: qty 2

## 2011-12-01 NOTE — Progress Notes (Signed)
Patient ID: Steven [name], male   DOB: February 11, 1971, 41 y.o.   MRN: [id number] Pt discharged home. Stated that he was going to his grandmother house, a friend picked him up. He denies thought of SI and voiced understanding of discharge instruction , sample medication and prescriptions provided to him.
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
## 2011-12-01 NOTE — [facility] Suicide Risk Assessment (Signed)
Suicide Risk Assessment  Discharge Assessment     Demographic factors:  Male;Caucasian;Access to firearms    Current Mental Status Per Nursing Assessment::   On Admission:  Suicidal ideation indicated by patient;Self-harm thoughts At Discharge:    patient denies any suicidal thoughts, plans to attend meetings regularly, will be using his cousin who's been sober for a year as a sponsor  Current Mental Status Per Physician:Patient is alert and oriented x3 Patient reports mood is good, his affect is full and congruent Patient denies any suicidal ideation homicidal ideation any delusions or paranoia. His thought processes are organized and goal directed. He seems to have insight into his illness, understands that he has a substance abuse problem, needs to learn to identify his triggers, have regular followup in attend meetings. His judgment seems to be fair at this time.  Loss Factors:  Impulsivity , H/O Traumatic brain injury  Historical Factors: Prior suicide attempts;Family history of suicide  Risk Reduction Factors:    Lives with Grandmother in [place], [place]  Continued Clinical Symptoms:  Alcohol/Substance Abuse/Dependencies  Discharge Diagnoses:   AXIS I:  Substance Abuse AXIS II:  Deferred AXIS III:   Past Medical History  Diagnosis Date  . Coronary artery disease   . Hypertension   . Hypercholesteremia    AXIS IV:  problems related to legal system/crime and problems related to social environment AXIS V:  51-60 moderate symptoms  Cognitive Features That Contribute To Risk: Impulsivity, H/O traumatic brain injury  Suicide Risk:  Minimal: No identifiable suicidal ideation.  Patients presenting with no risk factors but with morbid ruminations; may be classified as minimal risk based on the severity of the depressive symptoms  Plan Of Care/Follow-up recommendations:  Activity:  As tolerated Diet:  Low Sodium, low fat diet Other:  Attend AA/NA meetings, 30 in 30  days  Lowery Paullin 12/01/2011, 9:05 AM

## 2011-12-01 NOTE — Treatment Plan (Signed)
Interdisciplinary Treatment Plan Update (Adult)  Date: 12/01/2011  Time Reviewed: 11:45 AM   Progress in Treatment: Attending groups: Yes Participating in groups: Yes Taking medication as prescribed: Yes Tolerating medication: Yes   Family/Significant othe contact made:   Patient understands diagnosis:  Yes Discussing patient identified problems/goals with staff:  Yes Medical problems stabilized or resolved:  Yes Denies suicidal/homicidal ideation: Yes  In AM group and on self inventory Issues/concerns per patient self-inventory:  None noted Other:  New problem(s) identified: N/A  Reason for Continuation of Hospitalization: Other; describe D/C today  Interventions implemented related to continuation of hospitalization:   Additional comments:  Estimated length of stay:  Discharge Plan:return home, see below  New goal(s): N/A  Review of initial/current patient goals per problem list:   1.  Goal(s):Safely detox from alcohol  Met:  Yes  Target date:7/19  As evidenced by:Stable vitals, no withdrawal symptoms  2.  Goal (s):Eliminate SI  Met:  Yes  Target date:See previous tx plan  As evidenced by:  3.  Goal(s): Identify comprehensive sobriety plan  Met:  Yes  Target date:7/19  As evidenced GM:WNUUV states he will follow up with outpt appointments and attend AA mtgs  4.  Goal(s):  Met:  Yes  Target date:  As evidenced by:  Attendees: Patient:     Family:     Physician:  Lupe Carney 12/01/2011 11:45 AM   Nursing:    12/01/2011 11:45 AM   Case Manager:  Richelle Ito, LCSW 12/01/2011 11:45 AM   Counselor:   12/01/2011 11:45 AM   Other:     Other:     Other:     Other:      Scribe for Treatment Team:   Ida Rogue, 12/01/2011 11:45 AM

## 2011-12-01 NOTE — Progress Notes (Signed)
[facility] Case Management Discharge Plan:  Will you be returning to the same living situation after discharge: Yes,  home At discharge, do you have transportation home?:Yes,  family Do you have the ability to pay for your medications:Yes,  mental health  Interagency Information:     Release of information consent forms completed and in the chart;  Patient's signature needed at discharge.  Patient to Follow up at:  Follow-up Information    Follow up with I was unsuccessful in getting you an appointment with Dr Jeannetta Nap.  You will have to pursue that on your own..      Follow up with [facility]. (Walk in between 8 and 9 M-F next week for your hospital follow up appointment)    Contact information:   [address]  [place]  [336]  [phone]         Patient denies SI/HI:   Yes,  yes    Safety Planning and Suicide Prevention discussed:  Yes,  yes  Barrier to discharge identified:No.  Summary and Recommendations:   Steven [name] 12/01/2011, 10:10 AM

## 2011-12-01 NOTE — Progress Notes (Signed)
D: Pt reports right rib pain at a level 10/10. This pain is interfering with this pt's sleep.  A: An order for 400 mg of Ibuprofen q6prn was obtained for this pt. Writer administer this medication. Pt declined any heating packs at this time. Pt took a hot shower after administration of this med.  R: Pt was asleep at follow up. Ibuprofen was effective for this pt.

## 2011-12-01 NOTE — Progress Notes (Signed)
[facility] Adult Inpatient Family/Significant Other Suicide Prevention Education  Suicide Prevention Education:  Education Completed; Steven [name] at [phone] has been identified by the patient as the family member/significant other with whom the patient will be residing, and identified as the person(s) who will aid the patient in the event of a mental health crisis (suicidal ideations/suicide attempt).  With written consent from the patient, the family member/significant other has been provided the following suicide prevention education, prior to the and/or following the discharge of the patient.  The suicide prevention education provided includes the following:  Suicide risk factors  Suicide prevention and interventions  National Suicide Hotline telephone number  [facility] assessment telephone number  [facility] Emergency Assistance 911  [facility] and/or Residential Mobile Crisis Unit telephone number  Request made of family/significant other to:  Remove weapons (e.g., guns, rifles, knives), all items previously/currently identified as safety concern.  Patient reported at admit that he had "access to a gun" and has since currently denied.  Steven [name] states there are no firearms in the home.   Remove drugs/medications (over-the-counter, prescriptions, illicit drugs), all items previously/currently identified as a safety concern. All are secured.  Steven [name]'s main concern regarding patient involves his alcohol use.    The family member/significant other verbalizes understanding of the suicide prevention education information provided.  The family member/significant other agrees to remove the items of safety concern listed above.  Steven [name] 12/01/2011, 9:38 AM

## 2011-12-04 NOTE — Progress Notes (Signed)
Patient Discharge Instructions:  After Visit Summary (AVS):   Faxed to:  12/04/2011 Psychiatric Admission Assessment Note:   Faxed to:  12/04/2011 Suicide Risk Assessment - Discharge Assessment:   Faxed to:  12/04/2011 Faxed/Sent to the Next Level Care provider:  12/04/2011  Faxed to [facility] @ [phone]  Heloise Purpura Eduard Clos, 12/04/2011, 4:48 PM

## 2012-08-14 ENCOUNTER — Observation Stay (HOSPITAL_COMMUNITY)
Admission: EM | Admit: 2012-08-14 | Discharge: 2012-08-15 | Disposition: A | Discharge status: Home / Self Care | Payer: Self-pay | Attending: Family Medicine | Admitting: Family Medicine

## 2012-08-14 ENCOUNTER — Encounter (HOSPITAL_COMMUNITY): Payer: Self-pay | Admitting: *Deleted

## 2012-08-14 ENCOUNTER — Emergency Department (HOSPITAL_COMMUNITY): Payer: Self-pay

## 2012-08-14 DIAGNOSIS — R29898 Other symptoms and signs involving the musculoskeletal system: Secondary | ICD-10-CM | POA: Insufficient documentation

## 2012-08-14 DIAGNOSIS — I739 Peripheral vascular disease, unspecified: Secondary | ICD-10-CM

## 2012-08-14 DIAGNOSIS — F1010 Alcohol abuse, uncomplicated: Secondary | ICD-10-CM

## 2012-08-14 DIAGNOSIS — F17200 Nicotine dependence, unspecified, uncomplicated: Secondary | ICD-10-CM

## 2012-08-14 DIAGNOSIS — I25810 Atherosclerosis of coronary artery bypass graft(s) without angina pectoris: Secondary | ICD-10-CM

## 2012-08-14 DIAGNOSIS — K3189 Other diseases of stomach and duodenum: Secondary | ICD-10-CM | POA: Insufficient documentation

## 2012-08-14 DIAGNOSIS — R112 Nausea with vomiting, unspecified: Secondary | ICD-10-CM | POA: Insufficient documentation

## 2012-08-14 DIAGNOSIS — F1020 Alcohol dependence, uncomplicated: Secondary | ICD-10-CM | POA: Insufficient documentation

## 2012-08-14 DIAGNOSIS — I10 Essential (primary) hypertension: Secondary | ICD-10-CM

## 2012-08-14 DIAGNOSIS — Z951 Presence of aortocoronary bypass graft: Secondary | ICD-10-CM | POA: Insufficient documentation

## 2012-08-14 DIAGNOSIS — F1410 Cocaine abuse, uncomplicated: Secondary | ICD-10-CM | POA: Insufficient documentation

## 2012-08-14 DIAGNOSIS — I708 Atherosclerosis of other arteries: Principal | ICD-10-CM | POA: Insufficient documentation

## 2012-08-14 DIAGNOSIS — R0602 Shortness of breath: Secondary | ICD-10-CM

## 2012-08-14 DIAGNOSIS — F1910 Other psychoactive substance abuse, uncomplicated: Secondary | ICD-10-CM

## 2012-08-14 DIAGNOSIS — M79605 Pain in left leg: Secondary | ICD-10-CM

## 2012-08-14 DIAGNOSIS — R079 Chest pain, unspecified: Secondary | ICD-10-CM

## 2012-08-14 DIAGNOSIS — M79609 Pain in unspecified limb: Secondary | ICD-10-CM | POA: Insufficient documentation

## 2012-08-14 DIAGNOSIS — E785 Hyperlipidemia, unspecified: Secondary | ICD-10-CM

## 2012-08-14 DIAGNOSIS — M25559 Pain in unspecified hip: Secondary | ICD-10-CM | POA: Insufficient documentation

## 2012-08-14 DIAGNOSIS — I7389 Other specified peripheral vascular diseases: Secondary | ICD-10-CM | POA: Insufficient documentation

## 2012-08-14 DIAGNOSIS — F1210 Cannabis abuse, uncomplicated: Secondary | ICD-10-CM | POA: Insufficient documentation

## 2012-08-14 DIAGNOSIS — K219 Gastro-esophageal reflux disease without esophagitis: Secondary | ICD-10-CM

## 2012-08-14 HISTORY — DX: Nicotine dependence, unspecified, uncomplicated: F17.200

## 2012-08-14 HISTORY — DX: Calculus of kidney: N20.0

## 2012-08-14 LAB — CBC WITH DIFFERENTIAL/PLATELET
Basophils Absolute: 0 10*3/uL (ref 0.0–0.1)
Eosinophils Absolute: 0.6 10*3/uL (ref 0.0–0.7)
Eosinophils Relative: 7 % — ABNORMAL HIGH (ref 0–5)
MCH: 32.6 pg (ref 26.0–34.0)
MCV: 91.7 fL (ref 78.0–100.0)
Platelets: 258 10*3/uL (ref 150–400)
RDW: 12.9 % (ref 11.5–15.5)
WBC: 8.1 10*3/uL (ref 4.0–10.5)

## 2012-08-14 LAB — D-DIMER, QUANTITATIVE: D-Dimer, Quant: 0.33 ug/mL-FEU (ref 0.00–0.48)

## 2012-08-14 LAB — LIPID PANEL
LDL Cholesterol: 104 mg/dL — ABNORMAL HIGH (ref 0–99)
Triglycerides: 353 mg/dL — ABNORMAL HIGH (ref <150)

## 2012-08-14 LAB — COMPREHENSIVE METABOLIC PANEL
ALT: 21 U/L (ref 0–53)
AST: 17 U/L (ref 0–37)
Albumin: 3.5 g/dL (ref 3.5–5.2)
Calcium: 9.4 mg/dL (ref 8.4–10.5)
Sodium: 137 mEq/L (ref 135–145)
Total Protein: 6.8 g/dL (ref 6.0–8.3)

## 2012-08-14 LAB — RAPID URINE DRUG SCREEN, HOSP PERFORMED
Amphetamines: NOT DETECTED
Barbiturates: NOT DETECTED
Benzodiazepines: NOT DETECTED
Tetrahydrocannabinol: POSITIVE — AB

## 2012-08-14 LAB — TROPONIN I: Troponin I: <0.3 ng/mL (ref <0.30)

## 2012-08-14 LAB — ETHANOL: Alcohol, Ethyl (B): <11 mg/dL (ref 0–11)

## 2012-08-14 LAB — HEMOGLOBIN A1C
Hgb A1c MFr Bld: 5.9 % — ABNORMAL HIGH (ref <5.7)
Mean Plasma Glucose: 123 mg/dL — ABNORMAL HIGH (ref <117)

## 2012-08-14 MED ORDER — ACETAMINOPHEN 325 MG PO TABS: 650.0000 mg | ORAL_TABLET | Freq: Four times a day (QID) | ORAL | Status: DC | PRN

## 2012-08-14 MED ORDER — ASPIRIN 325 MG PO TABS
325.0000 mg | ORAL_TABLET | Freq: Every day | ORAL | Status: DC
  Filled 2012-08-14 (×2): qty 1

## 2012-08-14 MED ORDER — MORPHINE SULFATE 4 MG/ML IJ SOLN
4.0000 mg | Freq: Once | INTRAMUSCULAR | Status: AC
  Administered 2012-08-14: 4 mg via INTRAVENOUS
  Filled 2012-08-14: qty 1

## 2012-08-14 MED ORDER — NICOTINE 21 MG/24HR TD PT24
21.0000 mg | MEDICATED_PATCH | Freq: Every day | TRANSDERMAL | Status: DC
  Administered 2012-08-14 – 2012-08-15 (×2): 21 mg via TRANSDERMAL
  Filled 2012-08-14 (×2): qty 1

## 2012-08-14 MED ORDER — MORPHINE SULFATE 2 MG/ML IJ SOLN: 1.0000 mg | INTRAMUSCULAR | Status: DC | PRN

## 2012-08-14 MED ORDER — ATORVASTATIN CALCIUM 80 MG PO TABS
80.0000 mg | ORAL_TABLET | Freq: Every day | ORAL | Status: DC
  Administered 2012-08-14: 80 mg via ORAL
  Filled 2012-08-14 (×2): qty 1

## 2012-08-14 MED ORDER — ASPIRIN 81 MG PO CHEW
324.0000 mg | CHEWABLE_TABLET | Freq: Once | ORAL | Status: AC
  Administered 2012-08-14: 324 mg via ORAL
  Filled 2012-08-14: qty 4

## 2012-08-14 MED ORDER — SODIUM CHLORIDE 0.9 % IJ SOLN: 3.0000 mL | Freq: Two times a day (BID) | INTRAMUSCULAR | Status: DC

## 2012-08-14 MED ORDER — ALUM & MAG HYDROXIDE-SIMETH 200-200-20 MG/5ML PO SUSP
30.0000 mL | ORAL | Status: DC | PRN
Start: 2012-08-14 — End: 2012-08-15
  Administered 2012-08-14 (×2): 30 mL via ORAL
  Filled 2012-08-14: qty 30

## 2012-08-14 MED ORDER — SODIUM CHLORIDE 0.9 % IV SOLN
INTRAVENOUS | Status: DC
  Administered 2012-08-15: 04:00:00 via INTRAVENOUS

## 2012-08-14 MED ORDER — ONDANSETRON HCL 4 MG/2ML IJ SOLN: 4.0000 mg | Freq: Four times a day (QID) | INTRAMUSCULAR | Status: DC | PRN

## 2012-08-14 MED ORDER — ONDANSETRON HCL 4 MG PO TABS: 4.0000 mg | ORAL_TABLET | Freq: Four times a day (QID) | ORAL | Status: DC | PRN

## 2012-08-14 MED ORDER — ASPIRIN 81 MG PO CHEW: 324.0000 mg | CHEWABLE_TABLET | Freq: Every day | ORAL | Status: DC

## 2012-08-14 MED ORDER — HYDROMORPHONE HCL PF 1 MG/ML IJ SOLN
1.0000 mg | INTRAMUSCULAR | Status: DC | PRN
  Administered 2012-08-14 – 2012-08-15 (×4): 1 mg via INTRAVENOUS
  Filled 2012-08-14 (×4): qty 1

## 2012-08-14 MED ORDER — LEVALBUTEROL HCL 0.63 MG/3ML IN NEBU: 0.6300 mg | INHALATION_SOLUTION | Freq: Four times a day (QID) | RESPIRATORY_TRACT | Status: DC | PRN

## 2012-08-14 MED ORDER — ACETAMINOPHEN 650 MG RE SUPP: 650.0000 mg | Freq: Four times a day (QID) | RECTAL | Status: DC | PRN

## 2012-08-14 MED ORDER — ENOXAPARIN SODIUM 40 MG/0.4ML ~~LOC~~ SOLN
40.0000 mg | SUBCUTANEOUS | Status: DC
  Filled 2012-08-14: qty 0.4

## 2012-08-14 NOTE — Consult Note (Signed)
Admit date: 08/14/2012 Referring Physician  Dr. 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  He has not followed up with the cardiologist on regular basis.  Over the past 5-6 months, he has had progressive left leg pain with walking.  It occurs particularly in his calf and into his thigh.  There is weakness in his thigh and pain in his hip as well.  He was visiting his father in [place] and complaining of this worsening leg pain.  He is not able to walk more than for a few minutes due to the discomfort and weakness.  At rest, he states that he has some pain in his leg but not as bad as when walking.  He has not had any ulcers on his feet that would not heal.  His father took him to the emergency room at [facility] a few days ago.  At that point, he mentioned that he had been having intermittent chest tightness ever since his bypass surgery.  They did a CT scan of his chest abdomen and pelvis.  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  It was recommended that he be admitted for further workup.  Because he lives in [place], and he stated that he did not want to stay there and so he left AGAINST MEDICAL ADVICE.  We spoke at length about his chest discomfort.  There is an occasional heaviness in his chest that is not related to exertion.  Currently, he has some pain in the left side of his back.  He is adamant that his most limiting symptom is his left leg discomfort and that is asked why he came to the hospital.  He feels that his symptoms in his chest are stable.  He does not feel limited by his heart.  He is limited only by his leg discomfort.  He has not been taking any medicines regularly.  He shortly that he wants to stop smoking.  He also wants to get back on his medicines and start  taking care of himself better.  We discussed the possibility that if he had intervention on his left iliac, he would have to take dual antiplatelet therapy for at least a month.  He states he is willing to do this.  He wants to "get my life back."  He feels the left leg pain is so limiting that he cannot do anything.      PMH:   Past Medical History  Diagnosis Date  . Coronary artery disease   . Hypertension   . Hypercholesteremia   . Kidney stone      PSH:   Past Surgical History  Procedure Laterality Date  . Coronary artery bypass graft    . Kidney stone surgery      Allergies:  Review of patient's allergies indicates no known allergies. Prior to Admit Meds:   No prescriptions prior to admission   Fam HX:   History reviewed. No pertinent family history. Social HX:    History   Social History  . Marital Status: Divorced    Spouse Name: N/A    Number of Children: N/A  . Years of Education: N/A   Occupational History  . Not on file.   Social History Main Topics  . Smoking status: Current Every Day Smoker -- 1.00 packs/day for 31 years  .  Smokeless tobacco: Not on file  . Alcohol Use: Yes     Comment: 12pk/day on weekends  . Drug Use: Yes    Special: Cocaine, Marijuana  . Sexually Active: Yes    Birth Control/ Protection: Condom   Other Topics Concern  . Not on file   Social History Narrative  . No narrative on file     ROS:  All 11 ROS were addressed and are negative except what is stated in the HPI  Physical Exam: Blood pressure 106/59, pulse 64, temperature 97.6 F (36.4 C), temperature source Oral, resp. rate 16, height 5\' 10"  (1.778 m), weight 87.544 kg (193 lb), SpO2 95.00%.    General: Well developed, well nourished, in no acute distress Head:   Normal cephalic and atramatic  Lungs:   Clear bilaterally to auscultation and percussion. Heart:   HRRR S1 S2  Abdomen: Bowel sounds are positive, abdomen soft and non-tender without masses or                   Hernia's noted. Msk: Normal strength and tone for age. Extremities:  edema.  3+ right femoral pulse, trace left femoral pulse, 2+ right posterior tibial pulse, trace left posterior tibial pulse Neuro: Alert and oriented X 3. Psych:  Normal affect, responds appropriately    Labs:   Lab Results  Component Value Date   WBC 8.1 08/14/2012   HGB 13.3 08/14/2012   HCT 37.4* 08/14/2012   MCV 91.7 08/14/2012   PLT 258 08/14/2012    Recent Labs Lab 08/14/12 1000  NA 137  K 3.8  CL 101  CO2 24  BUN 21  CREATININE 0.94  CALCIUM 9.4  PROT 6.8  BILITOT 0.2*  ALKPHOS 75  ALT 21  AST 17  GLUCOSE 107*   No results found for this basename: PTT   No results found for this basename: INR, PROTIME   Lab Results  Component Value Date   CKTOTAL 74 11/28/2011   CKMB 2.7 11/28/2011   TROPONINI <0.30 08/14/2012     Lab Results  Component Value Date   CHOL  Value: 146        ATP III CLASSIFICATION:  <200     mg/dL   Desirable  308-657  mg/dL   Borderline High  >=846    mg/dL   High        96/29/5284   CHOL 169 11/23/2009   Lab Results  Component Value Date   HDL 32* 03/07/2010   HDL 35* 11/23/2009   Lab Results  Component Value Date   LDLCALC  Value: 71        Total Cholesterol/HDL:CHD Risk Coronary Heart Disease Risk Table                     Men   Women  1/2 Average Risk   3.4   3.3  Average Risk       5.0   4.4  2 X Average Risk   9.6   7.1  3 X Average Risk  23.4   11.0        Use the calculated Patient Ratio above and the CHD Risk Table to determine the patient's CHD Risk.        ATP III CLASSIFICATION (LDL):  <100     mg/dL   Optimal  132-440  mg/dL   Near or Above  Optimal  130-159  mg/dL   Borderline  347-425  mg/dL   High  >956     mg/dL   Very High 38/75/6433   LDLCALC 77 11/23/2009   Lab Results  Component Value Date   TRIG 216* 03/07/2010   TRIG 285* 11/23/2009   Lab Results  Component Value Date   CHOLHDL 4.6 03/07/2010   CHOLHDL 4.8 Ratio 11/23/2009   No  results found for this basename: LDLDIRECT      Radiology:  Dg Chest 2 View  08/14/2012  *RADIOLOGY REPORT*  Clinical Data: Chest pain  CHEST - 2 VIEW  Comparison: 11/27/2011  Findings: Pain cardiomediastinal silhouette is stable.  Status post CABG.  No acute infiltrate or pleural effusion.  No pulmonary edema.  Bony thorax is stable.  IMPRESSION: No active disease.  Status post CABG.   Original Report Authenticated By: Natasha Mead, M.D.     EKG:  Normal sinus rhythm, RSR prime, no significant ST segment changes  ASSESSMENT: Atypical chest discomfort, left leg claudication, tobacco abuse, noncompliance  PLAN:  We discussed very his options for workup.  His main symptom is his leg claudication.  He is not concerned about his chest discomfort.  His workup so far has been negative.  Assuming that he continues to rule out for MI, we will plan for lower extremity angiogram in the morning.  If he rules in for MI, then he will need cardiac cath.  If he rules out for MI, we will look at his lower extremities in particular his iliacs and potentially intervene upon the left iliac.  There is a chance that his left common iliac is actually occluded based on his exam.  I did explain to him that he could need a fem-fem bypass as a possible treatment if we're unable to revascularize his left common iliac.  I also stressed the importance of getting back on medical therapy.  He'll need aspirin, statin, possibly Plavix.  He'll need to stop smoking.  Would plan for a  stress test in the near future given his atypical symptoms.  If we are able to revascularize his iliac, could try for exercise treadmill test.  With the way his claudication is, he would not be able to exercise at this time.  The risks and benefits of the procedure were explained to the patient.  He is willing to proceed.  All questions were answered.  He also plans on seeing a primary care doctor close to his home.  Corky Crafts., MD  08/14/2012  5:08  PM

## 2012-08-14 NOTE — ED Notes (Addendum)
Pt here with multiple complaints. Pt complains of N/V, indigestion, CP,back pain, left leg pain.  Pt complains of CP and back pain that started 4 or 5 days ago.  Left leg pain x 3 months that is getting worse.  N/V and indigestion for past "several" days.

## 2012-08-14 NOTE — H&P (Signed)
[facility] History and Physical  Steven [name]:[id number] DOB: 1970-11-24 DOA: 08/14/2012  Referring physician: ER  PCP: No primary provider on file.   Chief Complaint: Chest pain   HPI:  42 year old male with a history of hypertension, polysubstance abuser presents to the ER with a chief complaint of nausea vomiting indigestion chest pain and left leg pain.Records reviewed from [facility]. Pt with work up there on 08/12/12. 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 Pt was admitted for cp rule out, but left AMA. Labs and CXR ordered here.  The patient was admitted to [facility]. But left AGAINST MEDICAL ADVICE. He does have some associated nausea. History the patient has a history of CABG 4 years ago at [facility]      Review of Systems: negative for the following  Respiratory: Positive for chest tightness and shortness of breath. Negative for cough.  Cardiovascular: Positive for chest pain. Negative for palpitations and leg swelling.  Gastrointestinal: Positive for nausea and vomiting. Negative for abdominal pain, diarrhea and constipation.  Genitourinary: Negative for dysuria.  Musculoskeletal: Positive for back pain.  Genitourinary: Denies dysuria, urgency, frequency, hematuria, flank pain and difficulty urinating.  Musculoskeletal: Denies myalgias, back pain, joint swelling, arthralgias and gait problem.  Skin: Denies pallor, rash and wound.  Neurological: Denies dizziness, seizures, syncope, weakness, light-headedness, numbness and headaches.  Hematological: Denies adenopathy. Easy bruising, personal or family bleeding history  Psychiatric/Behavioral: Denies suicidal ideation, mood changes, confusion, nervousness, sleep disturbance and agitation       Past Medical History  Diagnosis Date  . Coronary artery  disease   . Hypertension   . Hypercholesteremia   . Kidney stone      Past Surgical History  Procedure Laterality Date  . Coronary artery bypass graft    . Kidney stone surgery        Social History:  reports that he has been smoking.  He does not have any smokeless tobacco history on file. He reports that  drinks alcohol. He reports that he uses illicit drugs (Cocaine and Marijuana).    No Known Allergies  No family history on file.   Prior to Admission medications   Not on File   Family history Negative for coronary artery disease  Physical Exam: Filed Vitals:   08/14/12 0945 08/14/12 1115 08/14/12 1145 08/14/12 1230  BP: 120/71 112/63 102/53 106/59  Pulse: 77 66 68 64  Temp:      TempSrc:      Resp: 14 14 15 16   Height:      Weight:      SpO2: 96% 93% 96% 95%     Constitutional: Vital signs reviewed. Patient is a well-developed and well-nourished in no acute distress and cooperative with exam. Alert and oriented x3.  Head: Normocephalic and atraumatic  Ear: TM normal bilaterally  Mouth: no erythema or exudates, MMM  Eyes: PERRL, EOMI, conjunctivae normal, No scleral icterus.  Neck: Supple, Trachea midline normal ROM, No JVD, mass, thyromegaly, or carotid bruit present.  Cardiovascular: RRR, S1 normal, S2 normal, no MRG, pulses symmetric and intact bilaterally  Pulmonary/Chest: CTAB, no wheezes, rales, or rhonchi  Abdominal: Soft. Non-tender, non-distended, bowel sounds are normal, no masses, organomegaly, or guarding present.  GU: no CVA tenderness Musculoskeletal: No joint deformities, erythema, or stiffness, ROM full and no nontender Ext: no edema and no  cyanosis, pulses palpable bilaterally (DP and PT)  Hematology: no cervical, inginal, or axillary adenopathy.  Neurological: A&O x3, Strenght is normal and symmetric bilaterally, cranial nerve II-XII are grossly intact, no focal motor deficit, sensory intact to light touch bilaterally.  Skin: Warm, dry and  intact. No rash, cyanosis, or clubbing.  Psychiatric: Normal mood and affect. speech and behavior is normal. Judgment and thought content normal. Cognition and memory are normal.       Labs on Admission:    Basic Metabolic Panel:  Recent Labs Lab 08/14/12 1000  NA 137  K 3.8  CL 101  CO2 24  GLUCOSE 107*  BUN 21  CREATININE 0.94  CALCIUM 9.4   Liver Function Tests:  Recent Labs Lab 08/14/12 1000  AST 17  ALT 21  ALKPHOS 75  BILITOT 0.2*  PROT 6.8  ALBUMIN 3.5   No results found for this basename: LIPASE, AMYLASE,  in the last 168 hours No results found for this basename: AMMONIA,  in the last 168 hours CBC:  Recent Labs Lab 08/14/12 1000  WBC 8.1  NEUTROABS 4.5  HGB 13.3  HCT 37.4*  MCV 91.7  PLT 258   Cardiac Enzymes: No results found for this basename: CKTOTAL, CKMB, CKMBINDEX, TROPONINI,  in the last 168 hours  BNP (last 3 results) No results found for this basename: PROBNP,  in the last 8760 hours    CBG: No results found for this basename: GLUCAP,  in the last 168 hours  Radiological Exams on Admission: Dg Chest 2 View  08/14/2012  *RADIOLOGY REPORT*  Clinical Data: Chest pain  CHEST - 2 VIEW  Comparison: 11/27/2011  Findings: Pain cardiomediastinal silhouette is stable.  Status post CABG.  No acute infiltrate or pleural effusion.  No pulmonary edema.  Bony thorax is stable.  IMPRESSION: No active disease.  Status post CABG.   Original Report Authenticated By: Natasha Mead, M.D.     EKG: Independently reviewed. Rhythm: normal sinus rhythm  QRS Axis: normal  Intervals: normal  ST/T Wave abnormalities: normal  Conduction Disutrbances:none  Narrative Interpretation:  Old EKG Reviewed: changes noted tachycardia resolved   Assessment/Plan   1. Chest pain with history of CABG, patient will probably benefit from cardiology consultation and lexi scan Myoview due to possibility of noncompliance in the outpatient setting. 2. Polysubstance  abuse/cocaine/marijuana/alcohol, monitor for withdrawal 3. Left proximal iliac artery stenosis-will defer further management recommendations to cardiology, obtain ABI. May need an angiogram by either cardiology or vascular surgery  Code Status:   full Family Communication: bedside Disposition Plan: admit   Time spent: 70 mins   [facility] Pager [phone]  If 7PM-7AM, please contact night-coverage [email or web address] Password [email or web address] 08/14/2012, 1:28 PM

## 2012-08-14 NOTE — ED Provider Notes (Signed)
  Medical screening examination/treatment/procedure(s) were performed by non-physician practitioner and as supervising physician I was immediately available for consultation/collaboration.  On my exam the patient was in no distress.  He appeared calm, but with his history of prior occlusion, the description of new exertional symptoms he was admitted for further evaluation and management  I saw the ECG (if appropriate), relevant labs and studies - I agree with the interpretation.    Gerhard Munch, MD 08/14/12 1534

## 2012-08-14 NOTE — ED Provider Notes (Signed)
History     CSN: [id number]  Arrival date & time 08/14/12  0911   First MD Initiated Contact with Patient 08/14/12 [phone]      Chief Complaint  Patient presents with  . Chest Pain    (Consider location/radiation/quality/duration/timing/severity/associated sxs/prior treatment) HPI Steven [name] is a 42 y.o. male who presents to ED with complaint of chest pain, shortness of breath, back pain, left leg pain. States symptoms going on for "months." States pain between shoulder blades and SOB on Excertion. Chest pain is tightness and is constant. States left leg pain with walking only, improves with rest. 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 Pt states his symptoms are worsening. Pt states also having associated nausea, vomiting. No fever, or chills. No urinary or bowel problems. PT has hx of MI and CABG 4 years ago in [facility].     Past Medical History  Diagnosis Date  . Coronary artery disease   . Hypertension   . Hypercholesteremia   . Kidney stone     Past Surgical History  Procedure Laterality Date  . Coronary artery bypass graft    . Kidney stone surgery      No family history on file.  History  Substance Use Topics  . Smoking status: Current Every Day Smoker -- 1.00 packs/day for 31 years  . Smokeless tobacco: Not on file  . Alcohol Use: Yes     Comment: 12pk/day on weekends      Review of Systems  Constitutional: Negative for fever and chills.  Respiratory: Positive for chest tightness and shortness of breath. Negative for cough.   Cardiovascular: Positive for chest pain. Negative for palpitations and leg swelling.  Gastrointestinal: Positive for nausea and vomiting. Negative for abdominal pain, diarrhea and constipation.  Genitourinary: Negative for dysuria.  Musculoskeletal: Positive for back  pain.  Neurological: Negative.   All other systems reviewed and are negative.    Allergies  Review of patient's allergies indicates no known allergies.  Home Medications   Current Outpatient Rx  Name  Route  Sig  Dispense  Refill  . amLODipine (NORVASC) 5 MG tablet   Oral   Take 1 tablet (5 mg total) by mouth daily. For Blood Pressure   30 tablet   0   . aspirin 81 MG chewable tablet   Oral   Chew 1 tablet (81 mg total) by mouth daily. For Heart   30 tablet   0   . atorvastatin (LIPITOR) 10 MG tablet   Oral   Take 1 tablet (10 mg total) by mouth daily at 6 PM. For cholesterol   30 tablet   0   . divalproex (DEPAKOTE ER) 500 MG 24 hr tablet   Oral   Take 1 tablet (500 mg total) by mouth daily. For mood   30 tablet   0   . lisinopril (PRINIVIL,ZESTRIL) 20 MG tablet   Oral   Take 1 tablet (20 mg total) by mouth daily. For blood pressure   30 tablet   0   . naproxen (NAPROSYN) 500 MG tablet   Oral   Take 1 tablet (500 mg total) by mouth 2 (two) times daily as needed. For pain  Take with food   30 tablet   0   . predniSONE (DELTASONE) 10 MG  tablet      Take 3 tablet in am 6/20, take 2 tablets in am 6/21, take 1 tablet in am 6/22 then stop   6 tablet   0   . EXPIRED: traZODone (DESYREL) 100 MG tablet   Oral   Take 1 tablet (100 mg total) by mouth at bedtime as needed for sleep. For sleep   30 tablet   0     BP 117/61  Pulse 79  Temp(Src) 97.6 F (36.4 C) (Oral)  Ht 5\' 10"  (1.778 m)  Wt 193 lb (87.544 kg)  BMI 27.69 kg/m2  SpO2 98%  Physical Exam  Nursing note and vitals reviewed. Constitutional: He is oriented to person, place, and time. He appears well-developed and well-nourished. No distress.  Eyes: Conjunctivae are normal.  Neck: Neck supple.  Cardiovascular: Normal rate, regular rhythm and normal heart sounds.   Pulmonary/Chest: Effort normal and breath sounds normal. No respiratory distress. He has no wheezes. He has no rales.  Abdominal:  Soft. Bowel sounds are normal. He exhibits no distension. There is no tenderness. There is no rebound.  Musculoskeletal: He exhibits no edema.  Normal and equal dorsal pedal pulses bilaterally  Neurological: He is alert and oriented to person, place, and time.  Skin: Skin is warm and dry.    ED Course  Procedures (including critical care time)   Date: 08/14/2012  Rate: 83  Rhythm: normal sinus rhythm  QRS Axis: normal  Intervals: normal  ST/T Wave abnormalities: normal  Conduction Disutrbances:none  Narrative Interpretation:   Old EKG Reviewed: changes noted tachycardia resolved  10:10 AM Pt seen and examined. Records reviewed from [facility]. Pt with work up there on 08/12/12. 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 Pt was admitted for cp rule out, but left AMA. Labs and CXR ordered here. Will monitor pt. Pt has no PCP or cardiologist in the area.     Results for orders placed during the hospital encounter of 08/14/12  CBC WITH DIFFERENTIAL      Result Value Range   WBC 8.1  4.0 - 10.5 K/uL   RBC 4.08 (*) 4.22 - 5.81 MIL/uL   Hemoglobin 13.3  13.0 - 17.0 g/dL   HCT 11.9 (*) 14.7 - 82.9 %   MCV 91.7  78.0 - 100.0 fL   MCH 32.6  26.0 - 34.0 pg   MCHC 35.6  30.0 - 36.0 g/dL   RDW 56.2  13.0 - 86.5 %   Platelets 258  150 - 400 K/uL   Neutrophils Relative 56  43 - 77 %   Neutro Abs 4.5  1.7 - 7.7 K/uL   Lymphocytes Relative 26  12 - 46 %   Lymphs Abs 2.1  0.7 - 4.0 K/uL   Monocytes Relative 10  3 - 12 %   Monocytes Absolute 0.8  0.1 - 1.0 K/uL   Eosinophils Relative 7 (*) 0 - 5 %   Eosinophils Absolute 0.6  0.0 - 0.7 K/uL   Basophils Relative 1  0 - 1 %   Basophils Absolute 0.0  0.0 - 0.1 K/uL  COMPREHENSIVE METABOLIC PANEL      Result Value Range   Sodium 137  135 - 145 mEq/L   Potassium 3.8  3.5 - 5.1 mEq/L    Chloride 101  96 - 112 mEq/L   CO2  24  19 - 32 mEq/L   Glucose, Bld 107 (*) 70 - 99 mg/dL   BUN 21  6 - 23 mg/dL   Creatinine, Ser 1.61  0.50 - 1.35 mg/dL   Calcium 9.4  8.4 - 09.6 mg/dL   Total Protein 6.8  6.0 - 8.3 g/dL   Albumin 3.5  3.5 - 5.2 g/dL   AST 17  0 - 37 U/L   ALT 21  0 - 53 U/L   Alkaline Phosphatase 75  39 - 117 U/L   Total Bilirubin 0.2 (*) 0.3 - 1.2 mg/dL   GFR calc non Af Amer >90  >90 mL/min   GFR calc Af Amer >90  >90 mL/min  ETHANOL      Result Value Range   Alcohol, Ethyl (B) <11  0 - 11 mg/dL  POCT I-STAT TROPONIN I      Result Value Range   Troponin i, poc 0.00  0.00 - 0.08 ng/mL   Comment 3            Dg Chest 2 View  08/14/2012  *RADIOLOGY REPORT*  Clinical Data: Chest pain  CHEST - 2 VIEW  Comparison: 11/27/2011  Findings: Pain cardiomediastinal silhouette is stable.  Status post CABG.  No acute infiltrate or pleural effusion.  No pulmonary edema.  Bony thorax is stable.  IMPRESSION: No active disease.  Status post CABG.   Original Report Authenticated By: Natasha Mead, M.D.     Pt with known CAD, no follow up since his CABG 4  Years ago. See note above regarding recent tests and [facility] visit. Given pt's poor compliance and follow up will admit for CP rule out and possible stress test. Spoke with [facility], will admit, asked for Cardiology consult.    11:40 AM Spoke with Dr. Eldridge Dace, will consult on the pt.   1. Chest pain   2. Leg pain, left   3. Claudication of left lower extremity   4. Shortness of breath       MDM  Pt with CP, SOB, excertional symptoms. Hx of CABG 4 yrs ago. Pt is non compliant, alcoholic, no follow up. Given his risk factors and non compliance, will admit for further evaluation.   Filed Vitals:   08/14/12 0945 08/14/12 1115 08/14/12 1145 08/14/12 1230  BP: 120/71 112/63 102/53 106/59  Pulse: 77 66 68 64  Temp:      TempSrc:      Resp: 14 14 15 16   Height:      Weight:      SpO2: 96% 93% 96% 95%            Dashawna Delbridge A Luc Shammas, PA-C 08/14/12 1502

## 2012-08-15 ENCOUNTER — Encounter (HOSPITAL_COMMUNITY)
Admission: EM | Disposition: A | Discharge status: Home / Self Care | Payer: Self-pay | Source: Home / Self Care | Attending: Emergency Medicine

## 2012-08-15 DIAGNOSIS — R0602 Shortness of breath: Secondary | ICD-10-CM

## 2012-08-15 DIAGNOSIS — I10 Essential (primary) hypertension: Secondary | ICD-10-CM

## 2012-08-15 DIAGNOSIS — R079 Chest pain, unspecified: Secondary | ICD-10-CM

## 2012-08-15 DIAGNOSIS — F17200 Nicotine dependence, unspecified, uncomplicated: Secondary | ICD-10-CM

## 2012-08-15 HISTORY — PX: LOWER EXTREMITY ANGIOGRAM: SHX5508

## 2012-08-15 HISTORY — PX: ABDOMINAL ANGIOGRAM: SHX5499

## 2012-08-15 SURGERY — ANGIOGRAM, LOWER EXTREMITY
Anesthesia: LOCAL

## 2012-08-15 SURGERY — ANGIOGRAM, LOWER EXTREMITY
Anesthesia: Moderate Sedation

## 2012-08-15 MED ORDER — ACETAMINOPHEN 325 MG PO TABS: 650.0000 mg | ORAL_TABLET | ORAL | Status: DC | PRN

## 2012-08-15 MED ORDER — SODIUM CHLORIDE 0.9 % IV SOLN
1.0000 mL/kg/h | INTRAVENOUS | Status: AC
  Administered 2012-08-15: 1 mL/kg/h via INTRAVENOUS

## 2012-08-15 MED ORDER — FENTANYL CITRATE 0.05 MG/ML IJ SOLN
INTRAMUSCULAR | Status: AC
  Filled 2012-08-15: qty 2

## 2012-08-15 MED ORDER — SODIUM CHLORIDE 0.9 % IV SOLN: INTRAVENOUS | Status: DC

## 2012-08-15 MED ORDER — ONDANSETRON HCL 4 MG/2ML IJ SOLN: 4.0000 mg | Freq: Four times a day (QID) | INTRAMUSCULAR | Status: DC | PRN

## 2012-08-15 MED ORDER — ASPIRIN 81 MG PO CHEW: 81.0000 mg | CHEWABLE_TABLET | Freq: Every day | ORAL | Status: DC

## 2012-08-15 MED ORDER — OXYCODONE-ACETAMINOPHEN 10-325 MG PO TABS: 1.0000 | ORAL_TABLET | ORAL | Status: DC | PRN

## 2012-08-15 MED ORDER — NITROGLYCERIN 0.4 MG SL SUBL: 0.4000 mg | SUBLINGUAL_TABLET | SUBLINGUAL | Status: DC | PRN

## 2012-08-15 MED ORDER — NICOTINE 21 MG/24HR TD PT24: 1.0000 | MEDICATED_PATCH | Freq: Every day | TRANSDERMAL | Status: DC

## 2012-08-15 MED ORDER — MIDAZOLAM HCL 2 MG/2ML IJ SOLN
INTRAMUSCULAR | Status: AC
  Filled 2012-08-15: qty 2

## 2012-08-15 MED ORDER — CLOPIDOGREL BISULFATE 75 MG PO TABS: 75.0000 mg | ORAL_TABLET | Freq: Every day | ORAL | Status: DC

## 2012-08-15 MED ORDER — SODIUM CHLORIDE 0.9 % IV SOLN: 250.0000 mL | INTRAVENOUS | Status: DC | PRN

## 2012-08-15 MED ORDER — ASPIRIN 81 MG PO CHEW
324.0000 mg | CHEWABLE_TABLET | ORAL | Status: AC
  Administered 2012-08-15: 324 mg via ORAL
  Filled 2012-08-15: qty 4

## 2012-08-15 MED ORDER — SODIUM CHLORIDE 0.9 % IJ SOLN: 3.0000 mL | INTRAMUSCULAR | Status: DC | PRN

## 2012-08-15 MED ORDER — SODIUM CHLORIDE 0.9 % IJ SOLN: 3.0000 mL | Freq: Two times a day (BID) | INTRAMUSCULAR | Status: DC

## 2012-08-15 MED ORDER — ATORVASTATIN CALCIUM 80 MG PO TABS: 80.0000 mg | ORAL_TABLET | Freq: Every day | ORAL | Status: DC

## 2012-08-15 MED ORDER — DIAZEPAM 5 MG PO TABS
5.0000 mg | ORAL_TABLET | ORAL | Status: AC
  Administered 2012-08-15: 5 mg via ORAL
  Filled 2012-08-15: qty 1

## 2012-08-15 NOTE — Discharge Summary (Signed)
Physician Discharge Summary  Steven [name]:[id number] DOB: Feb 26, 1971 DOA: 08/14/2012  PCP: No primary provider on file.  Admit date: 08/14/2012 Discharge date: 08/15/2012  Time spent: > 35 minutes  Recommendations for Outpatient Follow-up:  1. Please be sure to follow up with the cardiologist in 1 week post discharge.  Discharge Diagnoses:  Principal Problem:   Chest pain Active Problems:   SUBSTANCE ABUSE   HYPERTENSION   Alcohol abuse, continuous   Shortness of breath   Leg pain   Claudication of left lower extremity   Discharge Condition: stable  Diet recommendation: Heart healthy diet.  Filed Weights   08/14/12 0922  Weight: 87.544 kg (193 lb)    History of present illness:  Please refer to HPI for further details  Pt is 42 y/o with h/o htn, CAD s/p CABPG, and nicotine dependence who presented to the ED complaining of chest pain and left leg discomfort.  Hospital Course:  1. Chest pain  - resolved.  - Cardiology on board. Patient has been requested to adhere to his recommended home regimen for his h/o CAD.   - EKG showed non specific ST wave changes but no ST elevation or depressions  2. Left proximal iliac artery stenosis - Patient will require further therapy per cardiology but given that he has had a lot of exposure to contrast at this juncture it has been recommended that patient have medical therapy and aggressive secondary prevention.  - Place patient on plavix, asa, statin - Close f/u with cardiologist next week.  3. Nicotine dependence - recommended cessation - Will prescribe nicotine patches  4. CAD s/p CABG - continue statin, plavix, and aspirin  Procedures:  LE angio  Consultations:  Cardiology: [facility]  Discharge Exam: Filed Vitals:   08/14/12 1145 08/14/12 1230 08/14/12 2046 08/15/12 0751  BP: 102/53 106/59 143/83   Pulse: 68 64 76 62  Temp:   98.2 F (36.8 C)   TempSrc:   Oral   Resp: 15 16 20    Height:      Weight:      SpO2:  96% 95% 99%     General: Pt in NAD, Alert and Awake Cardiovascular: RRR, No MRG Respiratory: CTA BL, no wheezes  Discharge Instructions  Discharge Orders   Future Orders Complete By Expires     Call MD for:  severe uncontrolled pain  As directed     Call MD for:  temperature >100.4  As directed     Diet - low sodium heart healthy  As directed     Discharge instructions  As directed     Comments:      Please be sure to follow up with your Cardiologist in 1 week or sooner should any new concerns arise.    Increase activity slowly  As directed         Medication List    TAKE these medications       aspirin 81 MG chewable tablet  Chew 1 tablet (81 mg total) by mouth daily.  Start taking on:  08/16/2012     atorvastatin 80 MG tablet  Commonly known as:  LIPITOR  Take 1 tablet (80 mg total) by mouth daily at 6 PM.     clopidogrel 75 MG tablet  Commonly known as:  PLAVIX  Take 1 tablet (75 mg total) by mouth daily with breakfast.  Start taking on:  08/16/2012     nicotine 21 mg/24hr patch  Commonly known as:  NICODERM CQ -  dosed in mg/24 hours  Place 1 patch onto the skin daily.     oxyCODONE-acetaminophen 10-325 MG per tablet  Commonly known as:  PERCOCET  Take 1 tablet by mouth every 4 (four) hours as needed for pain.          The results of significant diagnostics from this hospitalization (including imaging, microbiology, ancillary and laboratory) are listed below for reference.    Significant Diagnostic Studies: Dg Chest 2 View  08/14/2012  *RADIOLOGY REPORT*  Clinical Data: Chest pain  CHEST - 2 VIEW  Comparison: 11/27/2011  Findings: Pain cardiomediastinal silhouette is stable.  Status post CABG.  No acute infiltrate or pleural effusion.  No pulmonary edema.  Bony thorax is stable.  IMPRESSION: No active disease.  Status post CABG.   Original Report Authenticated By: Natasha Mead, M.D.     Microbiology: No results found for this or any previous visit (from the past  240 hour(s)).   Labs: Basic Metabolic Panel:  Recent Labs Lab 08/14/12 1000  NA 137  K 3.8  CL 101  CO2 24  GLUCOSE 107*  BUN 21  CREATININE 0.94  CALCIUM 9.4   Liver Function Tests:  Recent Labs Lab 08/14/12 1000  AST 17  ALT 21  ALKPHOS 75  BILITOT 0.2*  PROT 6.8  ALBUMIN 3.5    Recent Labs Lab 08/14/12 1354  LIPASE 36   No results found for this basename: AMMONIA,  in the last 168 hours CBC:  Recent Labs Lab 08/14/12 1000  WBC 8.1  NEUTROABS 4.5  HGB 13.3  HCT 37.4*  MCV 91.7  PLT 258   Cardiac Enzymes:  Recent Labs Lab 08/14/12 1354 08/14/12 1927 08/15/12 0045  TROPONINI <0.30 <0.30 <0.30   BNP: BNP (last 3 results) No results found for this basename: PROBNP,  in the last 8760 hours CBG: No results found for this basename: GLUCAP,  in the last 168 hours     Signed:  Penny Pia  [facility] 08/15/2012, 1:04 PM

## 2012-08-15 NOTE — Progress Notes (Addendum)
SUBJECTIVE:  No chest pain.  He did have left leg pain at rest and received IV diludid.  OBJECTIVE:   Vitals:   Filed Vitals:   08/14/12 1115 08/14/12 1145 08/14/12 1230 08/14/12 2046  BP: 112/63 102/53 106/59 143/83  Pulse: 66 68 64 76  Temp:    98.2 F (36.8 C)  TempSrc:    Oral  Resp: 14 15 16 20   Height:      Weight:      SpO2: 93% 96% 95% 99%   I&O's:  No intake or output data in the 24 hours ending 08/15/12 0742 TELEMETRY: Reviewed telemetry pt in NSR:     PHYSICAL EXAM General: Well developed, well nourished, in no acute distress Head:   Normal cephalic and atramatic  Lungs:   No wheezing Heart:  HRRR  Abdomen: obese Msk:   Normal strength and tone for age. Extremities:  No edema.  Tr left DP, can be better obtained by Doppler Neuro: Alert and oriented X 3. Psych:  Anxious affect, responds appropriately   LABS: Basic Metabolic Panel:  Recent Labs  16/10/96 1000  NA 137  K 3.8  CL 101  CO2 24  GLUCOSE 107*  BUN 21  CREATININE 0.94  CALCIUM 9.4   Liver Function Tests:  Recent Labs  08/14/12 1000  AST 17  ALT 21  ALKPHOS 75  BILITOT 0.2*  PROT 6.8  ALBUMIN 3.5    Recent Labs  08/14/12 1354  LIPASE 36   CBC:  Recent Labs  08/14/12 1000  WBC 8.1  NEUTROABS 4.5  HGB 13.3  HCT 37.4*  MCV 91.7  PLT 258   Cardiac Enzymes:  Recent Labs  08/14/12 1354 08/14/12 1927 08/15/12 0045  TROPONINI <0.30 <0.30 <0.30   BNP: No components found with this basename: POCBNP,  D-Dimer:  Recent Labs  08/14/12 1354  DDIMER 0.33   Hemoglobin A1C:  Recent Labs  08/14/12 1354  HGBA1C 5.9*   Fasting Lipid Panel:  Recent Labs  08/14/12 1354  CHOL 207*  HDL 32*  LDLCALC 104*  TRIG 353*  CHOLHDL 6.5   Thyroid Function Tests:  Recent Labs  08/14/12 1354  TSH 1.806   Anemia Panel: No results found for this basename: VITAMINB12, FOLATE, FERRITIN, TIBC, IRON, RETICCTPCT,  in the last 72 hours Coag Panel:   No results found  for this basename: INR, PROTIME    RADIOLOGY: Dg Chest 2 View  08/14/2012  *RADIOLOGY REPORT*  Clinical Data: Chest pain  CHEST - 2 VIEW  Comparison: 11/27/2011  Findings: Pain cardiomediastinal silhouette is stable.  Status post CABG.  No acute infiltrate or pleural effusion.  No pulmonary edema.  Bony thorax is stable.  IMPRESSION: No active disease.  Status post CABG.   Original Report Authenticated By: Natasha Mead, M.D.       ASSESSMENT: CAD, PAD  PLAN:  LE angio today.  Ruled out for MI.  Biggest complaint is leg pain.    Will have to get him back on medical therapy and aggressive secondary prevention.  Stressed importance of meds.  He is willing to take meds as prescribed, including plavix.  Corky Crafts., MD  08/15/2012  7:42 AM   Patient admitted to me after the procedure when asked about his chest pain, that he really was not having chest pain.  He stated that in the emergency room because he did not think he would be evaluated as quickly if his only complaint was leg pain.  We'll not  plan for any cardiac evaluation at this time based on his lack of symptoms.

## 2012-08-15 NOTE — CV Procedure (Addendum)
PROCEDURE:  Abdominal aortogram, pelvic angiogram, bilateral lower extremity angiogram.  INDICATIONS:  Left leg claudication  The risks, benefits, and details of the procedure were explained to the patient.  The patient verbalized understanding and wanted to proceed.  Informed written consent was obtained.  PROCEDURE TECHNIQUE:  After Xylocaine anesthesia a 73F sheath was placed in the right femoral artery with a single anterior needle wall stick.   A pigtail catheter was used to perform abdominal aortography.  The pigtail catheter was withdrawn and pelvic angiography was performed.  Bilateral lower extremity runoff through the pigtail catheter.   CONTRAST:  Total of 133 cc.  COMPLICATIONS:  None.    HEMODYNAMICS:  Aortic pressure was 143/79   ANGIOGRAPHIC DATA:  No dominant or to aneurysm.  95% left common iliac stenosis which is heavily calcified.  Right common iliac with mild to moderate disease.  Bilateral external iliacs, common femoral arteries, superficial femoral arteries, are all normal.  Bilateral posterior tibial arteries are the dominant vessel below the knee.  Three-vessel runoff below the knee.  IMPRESSIONS:  1. No AAA. 2.  Severe calcific left common iliac lesion. 3.  Mild right common iliac disease.  Bilateral three-vessel runoff below the knees.  RECOMMENDATION:  Will need PTA/stent with rotational atherectomy at a later time.  Moderate contrast usage at this setting.  He also had a CT scan the last couple of days with contrast.  There is some additional risk with rotational atherectomy.  I spoke to the patient and the patient's father regarding the risk of dissection and perforation and need for emergency surgery with this higher risk intervention.  They understand and are agreeable.  Will have the patient on Plavix as well.  He may need some pain medication for leg pain.  We'll see how he does during the day today.  Possible discharge later today.  Discussed findings with  Dr. Cena Benton.  I would like to see the patient back in the office prior to this procedure.  He has had a history of noncompliance and it will be very important for him to continue dual antiplatelet therapy without interruption for at least a month after the procedure.  Prior to placing the stent and committing him to this medication, I would like for him to show up for a followup appointment to show compliance.

## 2012-08-19 ENCOUNTER — Encounter (HOSPITAL_COMMUNITY): Payer: Self-pay | Admitting: Pharmacy Technician

## 2012-08-21 ENCOUNTER — Other Ambulatory Visit: Payer: Self-pay | Admitting: Interventional Cardiology

## 2012-08-26 ENCOUNTER — Ambulatory Visit (HOSPITAL_COMMUNITY)
Admission: RE | Admit: 2012-08-26 | Discharge: 2012-08-26 | Disposition: A | Discharge status: Home / Self Care | Payer: Self-pay | Source: Ambulatory Visit | Attending: Interventional Cardiology | Admitting: Interventional Cardiology

## 2012-08-26 ENCOUNTER — Encounter (HOSPITAL_COMMUNITY)
Admission: RE | Disposition: A | Discharge status: Home / Self Care | Payer: Self-pay | Source: Ambulatory Visit | Attending: Interventional Cardiology

## 2012-08-26 DIAGNOSIS — F1410 Cocaine abuse, uncomplicated: Secondary | ICD-10-CM | POA: Insufficient documentation

## 2012-08-26 DIAGNOSIS — R0789 Other chest pain: Secondary | ICD-10-CM | POA: Insufficient documentation

## 2012-08-26 DIAGNOSIS — F1210 Cannabis abuse, uncomplicated: Secondary | ICD-10-CM | POA: Insufficient documentation

## 2012-08-26 DIAGNOSIS — I708 Atherosclerosis of other arteries: Secondary | ICD-10-CM | POA: Insufficient documentation

## 2012-08-26 DIAGNOSIS — F17200 Nicotine dependence, unspecified, uncomplicated: Secondary | ICD-10-CM | POA: Insufficient documentation

## 2012-08-26 DIAGNOSIS — I10 Essential (primary) hypertension: Secondary | ICD-10-CM | POA: Insufficient documentation

## 2012-08-26 DIAGNOSIS — I2510 Atherosclerotic heart disease of native coronary artery without angina pectoris: Secondary | ICD-10-CM | POA: Insufficient documentation

## 2012-08-26 DIAGNOSIS — E7800 Pure hypercholesterolemia, unspecified: Secondary | ICD-10-CM | POA: Insufficient documentation

## 2012-08-26 SURGERY — ATHERECTOMY PERIPHERAL ARTERY

## 2012-08-26 MED ORDER — FENTANYL CITRATE 0.05 MG/ML IJ SOLN
INTRAMUSCULAR | Status: AC
  Filled 2012-08-26: qty 2

## 2012-08-26 MED ORDER — OXYCODONE-ACETAMINOPHEN 10-325 MG PO TABS: 1.0000 | ORAL_TABLET | ORAL | Status: DC | PRN

## 2012-08-26 MED ORDER — DIAZEPAM 5 MG PO TABS
ORAL_TABLET | ORAL | Status: AC
  Administered 2012-08-26: 5 mg via ORAL
  Filled 2012-08-26: qty 1

## 2012-08-26 MED ORDER — SODIUM CHLORIDE 0.9 % IJ SOLN: 3.0000 mL | INTRAMUSCULAR | Status: DC | PRN

## 2012-08-26 MED ORDER — ASPIRIN 81 MG PO CHEW: 324.0000 mg | CHEWABLE_TABLET | ORAL | Status: AC

## 2012-08-26 MED ORDER — SODIUM CHLORIDE 0.9 % IV SOLN
INTRAVENOUS | Status: DC
  Administered 2012-08-26: 12:00:00 via INTRAVENOUS

## 2012-08-26 MED ORDER — OXYCODONE-ACETAMINOPHEN 5-325 MG PO TABS: 2.0000 | ORAL_TABLET | Freq: Once | ORAL | Status: AC

## 2012-08-26 MED ORDER — HEPARIN SODIUM (PORCINE) 1000 UNIT/ML IJ SOLN
INTRAMUSCULAR | Status: AC
  Filled 2012-08-26: qty 1

## 2012-08-26 MED ORDER — NITROGLYCERIN 1 MG/10 ML FOR IR/CATH LAB
INTRA_ARTERIAL | Status: AC
  Filled 2012-08-26: qty 30

## 2012-08-26 MED ORDER — ASPIRIN 81 MG PO CHEW: 81.0000 mg | CHEWABLE_TABLET | Freq: Every day | ORAL | Status: DC

## 2012-08-26 MED ORDER — DIAZEPAM 5 MG PO TABS: 5.0000 mg | ORAL_TABLET | ORAL | Status: AC

## 2012-08-26 MED ORDER — VERAPAMIL HCL 2.5 MG/ML IV SOLN
INTRAVENOUS | Status: AC
  Filled 2012-08-26: qty 2

## 2012-08-26 MED ORDER — SODIUM CHLORIDE 0.9 % IJ SOLN: 3.0000 mL | Freq: Two times a day (BID) | INTRAMUSCULAR | Status: DC

## 2012-08-26 MED ORDER — ASPIRIN 81 MG PO CHEW
CHEWABLE_TABLET | ORAL | Status: AC
  Administered 2012-08-26: 324 mg via ORAL
  Filled 2012-08-26: qty 4

## 2012-08-26 MED ORDER — ACETAMINOPHEN 325 MG PO TABS: 650.0000 mg | ORAL_TABLET | ORAL | Status: DC | PRN

## 2012-08-26 MED ORDER — MIDAZOLAM HCL 2 MG/2ML IJ SOLN
INTRAMUSCULAR | Status: AC
  Filled 2012-08-26: qty 2

## 2012-08-26 MED ORDER — SODIUM CHLORIDE 0.9 % IV SOLN: 1.0000 mL/kg/h | INTRAVENOUS | Status: DC

## 2012-08-26 MED ORDER — CLOPIDOGREL BISULFATE 300 MG PO TABS
ORAL_TABLET | ORAL | Status: AC
  Filled 2012-08-26: qty 1

## 2012-08-26 MED ORDER — LIDOCAINE HCL (PF) 1 % IJ SOLN
INTRAMUSCULAR | Status: AC
  Filled 2012-08-26: qty 30

## 2012-08-26 MED ORDER — CLOPIDOGREL BISULFATE 75 MG PO TABS: 75.0000 mg | ORAL_TABLET | Freq: Every day | ORAL | Status: DC

## 2012-08-26 MED ORDER — HEPARIN (PORCINE) IN NACL 2-0.9 UNIT/ML-% IJ SOLN
INTRAMUSCULAR | Status: AC
  Filled 2012-08-26: qty 1000

## 2012-08-26 MED ORDER — OXYCODONE-ACETAMINOPHEN 5-325 MG PO TABS
ORAL_TABLET | ORAL | Status: AC
  Administered 2012-08-26: 2 via ORAL
  Filled 2012-08-26: qty 2

## 2012-08-26 MED ORDER — SODIUM CHLORIDE 0.9 % IV SOLN: 250.0000 mL | INTRAVENOUS | Status: DC | PRN

## 2012-08-26 MED ORDER — ONDANSETRON HCL 4 MG/2ML IJ SOLN: 4.0000 mg | Freq: Four times a day (QID) | INTRAMUSCULAR | Status: DC | PRN

## 2012-08-26 NOTE — CV Procedure (Addendum)
PROCEDURE:  Atherectomy of the left common iliac artery.  PTA/stent of the left common iliac artery  INDICATIONS:  Left leg claudication  The risks, benefits, and details of the procedure were explained to the patient.  The patient verbalized understanding and wanted to proceed.  Informed written consent was obtained.  PROCEDURE TECHNIQUE:  After Xylocaine anesthesia a 42F sheath was placed in the right femoral artery with a single anterior needle wall stick.   A 7 French Bright tip sheath was placed in the left common femoral artery extending into the left external iliac artery.  Previous diagnostic angiography had shown a heavily calcified 95% stenosis of the left common iliac artery.  Heparin was used for anticoagulation.  ACT was checked to make sure that the heparin was therapeutic.  Intervention was performed.  Please see below for details.  At the end of the procedure, a 5 French Mynx device was used for hemostasis in the right groin.  A 7 French Mynx Device was used for hemostasis in the left groin.     CONTRAST:  Total of 145 cc.  COMPLICATIONS:  None.    HEMODYNAMICS:  Aortic pressure was 165/82; pre intervention LEIA pressure was 93/57; postintervention LEIA 160/80  ANGIOGRAPHIC DATA:   Heavily calcified 95% stenosis of the left common iliac artery.   Interventional narrative :   A versa core wire was used to cross the stenosis in the left iliac.  This wire was changed out for a viper wire.  A 2 mm Crown was used to perform rotational atherectomy in the proximal left common iliac artery.  5 passes were made.  Subsequent angiography showed significant improvement.  A Benson wire was placed to maintain access in the right iliac system.  A 7 x 40 mm balloon was used to predilate the area of disease.  This balloon expanded well.  Prior to stent positioning, a pigtail catheter was placed from the right common femoral artery into the distal aorta to help image the left proximal common iliac  artery.  An 8 x 27 balloon-expandable stent was deployed in the left common proximal iliac artery.  The stent was postdilated with a 9 mm balloon and inflated until the patient had back pain.  There is an excellent Angiographic result.  There is poststenotic dilatation.  The stent was flared into this larger area.  The pressure gradient resolved after the stent placement.  The patient tolerated the procedure well.    IMPRESSIONS:  1.  Successful rotational atherectomy and PTA/stent of the left common iliac artery.  8 mm x 27 balloon-expandable stent was post dilated to greater than 9 mm in diameter.  No significant plaque shift noted into the right common iliac system.    RECOMMENDATION:  Patient needs aggressive secondary prevention.  We talked about smoking cessation.  He is now on lipid-lowering therapy.  He is on blood pressure medication.  He needs to walk regularly.  I stressed the importance of dual antiplatelet therapy.  He has already been taking his Plavix for over a week.  A prescription for a 30 day supply of Percocet will be given to the patient to help with post procedure groin pain.  After this, he will have to speak to his primary care doctor regarding pain medication.

## 2012-08-26 NOTE — H&P (Signed)
  Date of Initial H&P: 08/14/12  History reviewed, patient examined, no change in status, stable for surgery. Diagnostic showed heavily calcified left common iliac.  Risks and benefits of atherectomy explained to patient.   All questions answered.  He is willing to proceed.

## 2012-08-27 ENCOUNTER — Encounter (HOSPITAL_COMMUNITY): Payer: Self-pay | Admitting: Emergency Medicine

## 2012-08-27 ENCOUNTER — Encounter (HOSPITAL_COMMUNITY): Payer: Self-pay | Admitting: Pharmacy Technician

## 2012-08-27 ENCOUNTER — Emergency Department (HOSPITAL_COMMUNITY)
Admission: EM | Admit: 2012-08-27 | Discharge: 2012-08-27 | Disposition: A | Discharge status: Home / Self Care | Payer: Self-pay | Attending: Emergency Medicine | Admitting: Emergency Medicine

## 2012-08-27 DIAGNOSIS — M79609 Pain in unspecified limb: Secondary | ICD-10-CM

## 2012-08-27 DIAGNOSIS — Z87442 Personal history of urinary calculi: Secondary | ICD-10-CM | POA: Insufficient documentation

## 2012-08-27 DIAGNOSIS — E7800 Pure hypercholesterolemia, unspecified: Secondary | ICD-10-CM | POA: Insufficient documentation

## 2012-08-27 DIAGNOSIS — Z7982 Long term (current) use of aspirin: Secondary | ICD-10-CM | POA: Insufficient documentation

## 2012-08-27 DIAGNOSIS — Z951 Presence of aortocoronary bypass graft: Secondary | ICD-10-CM | POA: Insufficient documentation

## 2012-08-27 DIAGNOSIS — G8918 Other acute postprocedural pain: Secondary | ICD-10-CM | POA: Insufficient documentation

## 2012-08-27 DIAGNOSIS — I724 Aneurysm of artery of lower extremity: Secondary | ICD-10-CM

## 2012-08-27 DIAGNOSIS — Z79899 Other long term (current) drug therapy: Secondary | ICD-10-CM | POA: Insufficient documentation

## 2012-08-27 DIAGNOSIS — I2510 Atherosclerotic heart disease of native coronary artery without angina pectoris: Secondary | ICD-10-CM | POA: Insufficient documentation

## 2012-08-27 DIAGNOSIS — F17200 Nicotine dependence, unspecified, uncomplicated: Secondary | ICD-10-CM | POA: Insufficient documentation

## 2012-08-27 DIAGNOSIS — I739 Peripheral vascular disease, unspecified: Secondary | ICD-10-CM | POA: Insufficient documentation

## 2012-08-27 DIAGNOSIS — I10 Essential (primary) hypertension: Secondary | ICD-10-CM | POA: Insufficient documentation

## 2012-08-27 LAB — POCT I-STAT, CHEM 8
BUN: 8 mg/dL (ref 6–23)
Calcium, Ion: 1.29 mmol/L — ABNORMAL HIGH (ref 1.12–1.23)
Creatinine, Ser: 1 mg/dL (ref 0.50–1.35)
Glucose, Bld: 95 mg/dL (ref 70–99)
Hemoglobin: 12.2 g/dL — ABNORMAL LOW (ref 13.0–17.0)
Sodium: 143 mEq/L (ref 135–145)
TCO2: 27 mmol/L (ref 0–100)

## 2012-08-27 MED ORDER — OXYCODONE-ACETAMINOPHEN 5-325 MG PO TABS: 1.0000 | ORAL_TABLET | Freq: Four times a day (QID) | ORAL | Status: DC | PRN

## 2012-08-27 MED ORDER — HYDROMORPHONE HCL PF 1 MG/ML IJ SOLN
1.0000 mg | Freq: Once | INTRAMUSCULAR | Status: DC
  Filled 2012-08-27: qty 1

## 2012-08-27 MED ORDER — HYDROMORPHONE HCL PF 1 MG/ML IJ SOLN
1.0000 mg | INTRAMUSCULAR | Status: AC
  Administered 2012-08-27: 1 mg via INTRAMUSCULAR

## 2012-08-27 NOTE — ED Notes (Signed)
Had stent placed  Yesterday and today  Left groin and leg pain it swelling also

## 2012-08-27 NOTE — Progress Notes (Addendum)
*  PRELIMINARY RESULTS* Vascular Ultrasound Left Lower Extremity Arterial Duplex has been completed.   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 Unable to determine neck length due to limited visualization secondary to decreased sound penetration. Preliminary results discussed with Dr.Powers.  08/27/2012 3:35 PM Gertie Fey, RDMS, RDCS

## 2012-08-27 NOTE — Consult Note (Signed)
[facility] [place]  Patient name: Steven [name] MRN: [id number] DOB: 12/31/1970 Sex: male  REASON FOR CONSULT: Pseudoaneurysm of left common femoral artery. Consults [place] from Dr. Everette Rank  HPI: Steven [name] is a 42 y.o. male who underwent arteriogram yesterday and successful angioplasty and stenting of the left common iliac artery by Dr. Eldridge Dace. Of note this was done through a 7 [place] sheath on the left. He left the hospital last night he was having some moderate discomfort in his left groin. This persisted. He noticed some ecchymosis in the groin and the office told him to come to the emergency department. He was evaluated by the emergency department and a duplex scan of the left groin was ordered which showed a 2.5 cm pseudoaneurysm originating from the left common femoral artery. Vascular surgery was consulted. He states he's had constant pain since the procedure but no significant change in the character of his pain. Prior to this he admitted to left hip claudication. He denies any history of rest pain or nonhealing ulcers.  His risk factors for peripheral vascular disease include hypertension, hypercholesterolemia, and a history of tobacco use. He denies any history of diabetes or history of premature cardiovascular disease.  Past Medical History  Diagnosis Date  . Coronary artery disease   . Hypertension   . Hypercholesteremia   . Kidney stone   . Tobacco use disorder     FAMILY HISTORY: He denies any family history of premature cardiovascular disease.  SOCIAL HISTORY: History  Substance Use Topics  . Smoking status: Current Every Day Smoker -- 1.00 packs/day for 31 years  . Smokeless tobacco: Not on file  . Alcohol Use: Yes     Comment: 12pk/day on weekends    No Known Allergies  No current facility-administered medications for this encounter.   Current Outpatient Prescriptions  Medication Sig Dispense Refill  . aspirin 81 MG chewable tablet  Chew 1 tablet (81 mg total) by mouth daily.  30 tablet  0  . atorvastatin (LIPITOR) 80 MG tablet Take 1 tablet (80 mg total) by mouth daily at 6 PM.  30 tablet  0  . clopidogrel (PLAVIX) 75 MG tablet Take 1 tablet (75 mg total) by mouth daily with breakfast.  30 tablet  0  . nitroGLYCERIN (NITROSTAT) 0.4 MG SL tablet Place 1 tablet (0.4 mg total) under the tongue every 5 (five) minutes as needed for chest pain.  15 tablet  0  . oxyCODONE-acetaminophen (PERCOCET) 10-325 MG per tablet Take 1 tablet by mouth every 4 (four) hours as needed for pain.  30 tablet  0    REVIEW OF SYSTEMS: Arly.Keller ] denotes positive finding; [  ] denotes negative finding CARDIOVASCULAR:  [ ]  chest pain   [ ]  chest pressure   [ ]  palpitations   [ ]  orthopnea   Arly.Keller ] dyspnea on exertion   [ ]  claudication   [ ]  rest pain   [ ]  DVT   [ ]  phlebitis PULMONARY:   [ ]  productive cough   [ ]  asthma   [ ]  wheezing NEUROLOGIC:   [ ]  weakness  [ ]  paresthesias  [ ]  aphasia  [ ]  amaurosis  [ ]  dizziness HEMATOLOGIC:   [ ]  bleeding problems   [ ]  clotting disorders MUSCULOSKELETAL:  [ ]  joint pain   [ ]  joint swelling [ ]  leg swelling GASTROINTESTINAL: [ ]   blood in stool  [ ]   hematemesis GENITOURINARY:  [ ]   dysuria  [ ]   hematuria PSYCHIATRIC:  [ ]  history of major depression INTEGUMENTARY:  [ ]  rashes  [ ]  ulcers CONSTITUTIONAL:  [ ]  fever   [ ]  chills  PHYSICAL EXAM: Filed Vitals:   08/27/12 1200 08/27/12 1219 08/27/12 1222  BP: 143/87  137/85  Pulse: 98  96  Temp:  97.9 F (36.6 C) 97.9 F (36.6 C)  TempSrc:   Oral  Resp: 16  18  SpO2: 100%  98%   There is no weight on file to calculate BMI. GENERAL: The patient is a well-nourished male, in no acute distress. The vital signs are documented above. CARDIOVASCULAR: There is a regular rate and rhythm. I do not detect carotid bruits. He has a palpable right femoral pulse. He is tender in the left groin so I did not push hard enough to palpate a left femoral pulse. I did not  appreciate femoral bruits. He has palpable popliteal dorsalis pedis and posterior tibial pulses bilaterally. He has no significant lower extremity swelling. PULMONARY: There is good air exchange bilaterally without wheezing or rales. ABDOMEN: Soft and non-tender with normal pitched bowel sounds.  MUSCULOSKELETAL: There are no major deformities or cyanosis. NEUROLOGIC: No focal weakness or paresthesias are detected. SKIN: he has ecchymosis in the left groin. There is a small palpable mass. PSYCHIATRIC: The patient has a normal affect.  DATA:  Lab Results  Component Value Date   WBC 8.1 08/14/2012   HGB 12.2* 08/27/2012   HCT 36.0* 08/27/2012   MCV 91.7 08/14/2012   PLT 258 08/14/2012   Lab Results  Component Value Date   NA 143 08/27/2012   K 3.6 08/27/2012   CL 105 08/27/2012   CO2 24 08/14/2012   Lab Results  Component Value Date   CREATININE 1.00 08/27/2012   No results found for this basename: INR, PROTIME   Lab Results  Component Value Date   HGBA1C 5.9* 08/14/2012   I have independently interpreted his duplex scan which shows a 2.5 cm pseudoaneurysm originating from the left common femoral artery. The neck is approximately 6 mm in length.  I reviewed his arteriogram which shows no significant plaque in the common femoral artery on the left.  MEDICAL ISSUES: PSEUDOANEURYSM OF LEFT COMMON FEMORAL ARTERY: This patient has a small pseudoaneurysm originating off the left common femoral artery. I've explained that there are several options for treating this. I would favor duplex directed manual occlusion as the safest approach. If this were not successful he could potentially be considered for thrombin injection. If neither was successful he could be considered for open surgical repair. I've explained it the main risk with surgical repair is a significant risk of wound healing problems and infection he is willing to proceed with duplex directed manual occlusion of the pseudoaneurysm which will  be scheduled for tomorrow. He understands that he'll need to be sedated for this to control the pain. If this is not successful we can consider thrombin injection or open repair.  DICKSON,CHRISTOPHER S [facility] [place] Beeper: [phone]

## 2012-08-27 NOTE — ED Provider Notes (Signed)
History     CSN: [id number]  Arrival date & time 08/27/12  1155   First MD Initiated Contact with Patient 08/27/12 1205      Chief Complaint  Patient presents with  . Leg Pain    (Consider location/radiation/quality/duration/timing/severity/associated sxs/prior treatment) HPI Comments: Steven [name] had catheterization with stent placement to the left leg yesterday secondary to PVD.  Catheters were introduced bilaterally.  He has developed pain, swelling, and bruising at the left insertion site.  He denies fever.  He denies distal leg pain, pallor, or a cold extremity.  The history is provided by the patient. No language interpreter was used.    Past Medical History  Diagnosis Date  . Coronary artery disease   . Hypertension   . Hypercholesteremia   . Kidney stone   . Tobacco use disorder     Past Surgical History  Procedure Laterality Date  . Coronary artery bypass graft    . Kidney stone surgery      No family history on file.  History  Substance Use Topics  . Smoking status: Current Every Day Smoker -- 1.00 packs/day for 31 years  . Smokeless tobacco: Not on file  . Alcohol Use: Yes     Comment: 12pk/day on weekends      Review of Systems  Cardiovascular: Positive for leg swelling. Negative for chest pain and palpitations.  Skin: Positive for color change. Negative for pallor and wound.  All other systems reviewed and are negative.    Allergies  Review of patient's allergies indicates no known allergies.  Home Medications   Current Outpatient Rx  Name  Route  Sig  Dispense  Refill  . aspirin 81 MG chewable tablet   Oral   Chew 1 tablet (81 mg total) by mouth daily.   30 tablet   0   . atorvastatin (LIPITOR) 80 MG tablet   Oral   Take 1 tablet (80 mg total) by mouth daily at 6 PM.   30 tablet   0   . clopidogrel (PLAVIX) 75 MG tablet   Oral   Take 1 tablet (75 mg total) by mouth daily with breakfast.   30 tablet   0   . nitroGLYCERIN  (NITROSTAT) 0.4 MG SL tablet   Sublingual   Place 1 tablet (0.4 mg total) under the tongue every 5 (five) minutes as needed for chest pain.   15 tablet   0   . oxyCODONE-acetaminophen (PERCOCET) 10-325 MG per tablet   Oral   Take 1 tablet by mouth every 4 (four) hours as needed for pain.   30 tablet   0     BP 137/85  Pulse 96  Temp(Src) 97.9 F (36.6 C) (Oral)  Resp 18  SpO2 98%  Physical Exam  Nursing note and vitals reviewed. Constitutional: He is oriented to person, place, and time. He appears well-developed. No distress.  HENT:  Head: Normocephalic and atraumatic.  Right Ear: External ear normal.  Left Ear: External ear normal.  Nose: Nose normal.  Mouth/Throat: Oropharynx is clear and moist. No oropharyngeal exudate.  Eyes: Conjunctivae are normal. Pupils are equal, round, and reactive to light. Right eye exhibits no discharge. Left eye exhibits no discharge. No scleral icterus.  Neck: Normal range of motion. Neck supple. No JVD present. No tracheal deviation present.  Cardiovascular: Normal rate, regular rhythm, normal heart sounds and intact distal pulses.  Exam reveals no gallop and no friction rub.   No murmur heard. Pulmonary/Chest: Effort  normal. No stridor. No respiratory distress. He has wheezes. He has no rales. He exhibits no tenderness.  Abdominal: Soft. Bowel sounds are normal. He exhibits no distension. There is no tenderness. There is no rebound, no guarding and no CVA tenderness. No hernia.  Musculoskeletal: Normal range of motion. He exhibits tenderness. He exhibits no edema.       Left hip: He exhibits tenderness and swelling (left groin, no bony tenderness). He exhibits normal range of motion, normal strength, no bony tenderness, no crepitus, no deformity and no laceration.       Legs: Lymphadenopathy:    He has no cervical adenopathy.  Neurological: He is alert and oriented to person, place, and time. No cranial nerve deficit.  Skin: Skin is warm and  dry. Bruising and ecchymosis (marked left groin and upper thigh) noted. No laceration, no petechiae and no rash noted. He is not diaphoretic. No cyanosis or erythema. No pallor. Nails show no clubbing.     Note swelling and tenderness at the site of the left groin catheter insertion.  Ecchymosis noted of the proximal anterolateral thigh.  No edema or fluctuance appreciated.  Psychiatric: He has a normal mood and affect. His behavior is normal.    ED Course  Procedures (including critical care time)  Labs Reviewed - No data to display No results found.   No diagnosis found.    MDM  Pt presents for evaluation of pain, swelling, and bruising at the site where a catheter was introduced for stenting of his left leg.  He has a hx of PVD.  There is no pallor and he has palpable pulses in the distal left leg.  Will obtain an ultrasound to assess for evidence of leak or pseudoaneurysm.  Will contact his specialist as the result becomes available.  1700.  There is a noted pseudoaneurysm at the left inguinal catheter insertion site.  I discussed his evaluation with Dr. Graciela Husbands (cardiology) and Dr. Durwin Nora (vascular surg) performed a bedside evaluation.  He will be discharged home now.  Tomorrow morning he will follow-up for placement of compression under procedural sedation.  He has no clinical evidence of infection and no hemodynamic instability.      Steven Chad, MD 08/27/12 [phone]

## 2012-08-27 NOTE — ED Notes (Signed)
Urine sample has been collected if needed.

## 2012-08-28 ENCOUNTER — Ambulatory Visit (HOSPITAL_COMMUNITY)
Admission: RE | Admit: 2012-08-28 | Discharge: 2012-08-28 | Disposition: A | Discharge status: Home / Self Care | Payer: No Typology Code available for payment source | Source: Ambulatory Visit | Attending: Vascular Surgery | Admitting: Vascular Surgery

## 2012-08-28 ENCOUNTER — Encounter (HOSPITAL_COMMUNITY)
Admission: RE | Disposition: A | Discharge status: Home / Self Care | Payer: Self-pay | Source: Ambulatory Visit | Attending: Vascular Surgery

## 2012-08-28 ENCOUNTER — Other Ambulatory Visit: Payer: Self-pay | Admitting: Thoracic Diseases

## 2012-08-28 ENCOUNTER — Other Ambulatory Visit: Payer: Self-pay | Admitting: *Deleted

## 2012-08-28 ENCOUNTER — Telehealth: Payer: Self-pay | Admitting: Vascular Surgery

## 2012-08-28 ENCOUNTER — Other Ambulatory Visit: Payer: Self-pay

## 2012-08-28 DIAGNOSIS — M79609 Pain in unspecified limb: Secondary | ICD-10-CM

## 2012-08-28 DIAGNOSIS — F17200 Nicotine dependence, unspecified, uncomplicated: Secondary | ICD-10-CM | POA: Insufficient documentation

## 2012-08-28 DIAGNOSIS — I724 Aneurysm of artery of lower extremity: Secondary | ICD-10-CM | POA: Insufficient documentation

## 2012-08-28 DIAGNOSIS — I10 Essential (primary) hypertension: Secondary | ICD-10-CM | POA: Insufficient documentation

## 2012-08-28 DIAGNOSIS — Z7982 Long term (current) use of aspirin: Secondary | ICD-10-CM | POA: Insufficient documentation

## 2012-08-28 DIAGNOSIS — Z7902 Long term (current) use of antithrombotics/antiplatelets: Secondary | ICD-10-CM | POA: Insufficient documentation

## 2012-08-28 DIAGNOSIS — E7800 Pure hypercholesterolemia, unspecified: Secondary | ICD-10-CM | POA: Insufficient documentation

## 2012-08-28 DIAGNOSIS — Z79899 Other long term (current) drug therapy: Secondary | ICD-10-CM | POA: Insufficient documentation

## 2012-08-28 DIAGNOSIS — Z48812 Encounter for surgical aftercare following surgery on the circulatory system: Secondary | ICD-10-CM

## 2012-08-28 DIAGNOSIS — M79605 Pain in left leg: Secondary | ICD-10-CM

## 2012-08-28 DIAGNOSIS — I739 Peripheral vascular disease, unspecified: Secondary | ICD-10-CM

## 2012-08-28 DIAGNOSIS — I2510 Atherosclerotic heart disease of native coronary artery without angina pectoris: Secondary | ICD-10-CM | POA: Insufficient documentation

## 2012-08-28 HISTORY — PX: CARDIOVERSION: SHX1299

## 2012-08-28 SURGERY — CARDIOVERSION
Anesthesia: LOCAL | Wound class: Clean

## 2012-08-28 MED ORDER — HYDROMORPHONE HCL PF 2 MG/ML IJ SOLN
1.0000 mg | Freq: Once | INTRAMUSCULAR | Status: AC
  Administered 2012-08-28: 1 mg via INTRAVENOUS

## 2012-08-28 MED ORDER — FENTANYL CITRATE 0.05 MG/ML IJ SOLN
25.0000 ug | Freq: Once | INTRAMUSCULAR | Status: AC
  Administered 2012-08-28: 25 ug via INTRAVENOUS

## 2012-08-28 MED ORDER — MORPHINE SULFATE 10 MG/ML IJ SOLN: 4.0000 mg | Freq: Once | INTRAMUSCULAR | Status: DC | PRN

## 2012-08-28 MED ORDER — FENTANYL CITRATE 0.05 MG/ML IJ SOLN
INTRAMUSCULAR | Status: AC
  Filled 2012-08-28: qty 2

## 2012-08-28 MED ORDER — MIDAZOLAM HCL 2 MG/2ML IJ SOLN
1.0000 mg | Freq: Once | INTRAMUSCULAR | Status: AC
  Administered 2012-08-28: 1 mg via INTRAVENOUS

## 2012-08-28 MED ORDER — OXYCODONE-ACETAMINOPHEN 10-325 MG PO TABS: 1.0000 | ORAL_TABLET | ORAL | Status: DC | PRN

## 2012-08-28 MED ORDER — SODIUM CHLORIDE 0.45 % IV SOLN: INTRAVENOUS | Status: DC

## 2012-08-28 MED ORDER — HYDROMORPHONE HCL PF 1 MG/ML IJ SOLN
INTRAMUSCULAR | Status: AC
  Filled 2012-08-28: qty 1

## 2012-08-28 MED ORDER — MORPHINE SULFATE 10 MG/ML IJ SOLN: 2.0000 mg | Freq: Once | INTRAMUSCULAR | Status: DC | PRN

## 2012-08-28 MED ORDER — OXYCODONE HCL 5 MG PO TABS
5.0000 mg | ORAL_TABLET | Freq: Four times a day (QID) | ORAL | Status: DC | PRN
  Administered 2012-08-28: 10 mg via ORAL
  Filled 2012-08-28: qty 2

## 2012-08-28 MED ORDER — HYDROMORPHONE HCL PF 2 MG/ML IJ SOLN: 1.0000 mg | Freq: Once | INTRAMUSCULAR | Status: DC

## 2012-08-28 MED ORDER — HYDROMORPHONE HCL PF 2 MG/ML IJ SOLN
INTRAMUSCULAR | Status: AC
  Filled 2012-08-28: qty 1

## 2012-08-28 MED ORDER — NICOTINE 21 MG/24HR TD PT24
21.0000 mg | MEDICATED_PATCH | TRANSDERMAL | Status: DC
  Filled 2012-08-28: qty 1

## 2012-08-28 MED ORDER — MIDAZOLAM HCL 2 MG/2ML IJ SOLN
INTRAMUSCULAR | Status: AC
  Filled 2012-08-28: qty 2

## 2012-08-28 NOTE — Progress Notes (Signed)
VASCULAR LAB PRELIMINARY  PRELIMINARY  PRELIMINARY  PRELIMINARY  Compression of a left groin pseudoaneurysm completed.    Preliminary report: Successful compression of a left  Femoral artery pseudoaneurysm post 30 min compression  Eulalah Rupert, RVS 08/28/2012, 5:08 PM

## 2012-08-28 NOTE — Progress Notes (Addendum)
[facility]  OPERATIVE NOTE    Pre-operative Diagnosis:  Pseudoaneurysm of left common femoral artery  Post-operative Diagnosis:  Pseudoaneurysm of left common femoral artery  Procedure:  Compression Pseudoaneurysm of left common femoral artery Ultrasound guided successful compression with vascular lab  Anesthesia:  conscious sedation Dilaudid 2 mg IV Fentanyl 50 mcg  Versed 3 mg  Complications:  none  Disposition:  Home after 6 hours of bed rest.   Doreatha Massed, PA-C [facility] [phone]

## 2012-08-28 NOTE — Telephone Encounter (Addendum)
Message copied by Rosalyn Charters on Wed Aug 28, 2012  3:12 PM ------      Message from: [place], [place] K      Created: Wed Aug 28, 2012  2:38 PM      Regarding: schedule                   ----- Message -----         From: Dara Lords, PA-C         Sent: 08/28/2012   1:05 PM           To: Sharee Pimple, CMA            S/p compression left femoral pseudoaneurysm 08/28/12.  F/u with Dr. Edilia Bo in 4 weeks.  He will probably want an ultrasound of the left femoral artery.            Thanks,      Lelon Mast ------  notified patient of fu appt. on 10-02-12 11:30 lab and 12:45 dr. pt. aware of the gap in appts.

## 2012-08-29 ENCOUNTER — Encounter (HOSPITAL_COMMUNITY): Payer: Self-pay | Admitting: Emergency Medicine

## 2012-08-29 ENCOUNTER — Telehealth: Payer: Self-pay | Admitting: Internal Medicine

## 2012-08-29 ENCOUNTER — Emergency Department (HOSPITAL_COMMUNITY)
Admission: EM | Admit: 2012-08-29 | Discharge: 2012-08-29 | Disposition: A | Discharge status: Home / Self Care | Payer: Self-pay | Attending: Emergency Medicine | Admitting: Emergency Medicine

## 2012-08-29 ENCOUNTER — Emergency Department (HOSPITAL_COMMUNITY): Payer: Self-pay

## 2012-08-29 DIAGNOSIS — F17200 Nicotine dependence, unspecified, uncomplicated: Secondary | ICD-10-CM | POA: Insufficient documentation

## 2012-08-29 DIAGNOSIS — E7800 Pure hypercholesterolemia, unspecified: Secondary | ICD-10-CM | POA: Insufficient documentation

## 2012-08-29 DIAGNOSIS — Z7902 Long term (current) use of antithrombotics/antiplatelets: Secondary | ICD-10-CM | POA: Insufficient documentation

## 2012-08-29 DIAGNOSIS — S301XXA Contusion of abdominal wall, initial encounter: Secondary | ICD-10-CM | POA: Insufficient documentation

## 2012-08-29 DIAGNOSIS — Z79899 Other long term (current) drug therapy: Secondary | ICD-10-CM | POA: Insufficient documentation

## 2012-08-29 DIAGNOSIS — I2510 Atherosclerotic heart disease of native coronary artery without angina pectoris: Secondary | ICD-10-CM | POA: Insufficient documentation

## 2012-08-29 DIAGNOSIS — Z7982 Long term (current) use of aspirin: Secondary | ICD-10-CM | POA: Insufficient documentation

## 2012-08-29 DIAGNOSIS — M79609 Pain in unspecified limb: Secondary | ICD-10-CM

## 2012-08-29 DIAGNOSIS — I10 Essential (primary) hypertension: Secondary | ICD-10-CM | POA: Insufficient documentation

## 2012-08-29 DIAGNOSIS — Y838 Other surgical procedures as the cause of abnormal reaction of the patient, or of later complication, without mention of misadventure at the time of the procedure: Secondary | ICD-10-CM | POA: Insufficient documentation

## 2012-08-29 DIAGNOSIS — R1032 Left lower quadrant pain: Secondary | ICD-10-CM

## 2012-08-29 DIAGNOSIS — R109 Unspecified abdominal pain: Secondary | ICD-10-CM | POA: Insufficient documentation

## 2012-08-29 DIAGNOSIS — Z951 Presence of aortocoronary bypass graft: Secondary | ICD-10-CM | POA: Insufficient documentation

## 2012-08-29 DIAGNOSIS — Z87442 Personal history of urinary calculi: Secondary | ICD-10-CM | POA: Insufficient documentation

## 2012-08-29 DIAGNOSIS — S301XXD Contusion of abdominal wall, subsequent encounter: Secondary | ICD-10-CM

## 2012-08-29 HISTORY — DX: Presence of aortocoronary bypass graft: Z95.1

## 2012-08-29 LAB — CBC WITH DIFFERENTIAL/PLATELET
Basophils Absolute: 0 10*3/uL (ref 0.0–0.1)
Basophils Relative: 0 % (ref 0–1)
Eosinophils Absolute: 0.6 10*3/uL (ref 0.0–0.7)
Eosinophils Relative: 6 % — ABNORMAL HIGH (ref 0–5)
HCT: 31.3 % — ABNORMAL LOW (ref 39.0–52.0)
MCH: 32.2 pg (ref 26.0–34.0)
MCHC: 35.1 g/dL (ref 30.0–36.0)
Monocytes Absolute: 1 10*3/uL (ref 0.1–1.0)
Monocytes Relative: 10 % (ref 3–12)
Neutro Abs: 6.2 10*3/uL (ref 1.7–7.7)
RDW: 13.5 % (ref 11.5–15.5)

## 2012-08-29 LAB — BASIC METABOLIC PANEL
BUN: 9 mg/dL (ref 6–23)
Calcium: 9.2 mg/dL (ref 8.4–10.5)
Chloride: 103 mEq/L (ref 96–112)
Creatinine, Ser: 0.96 mg/dL (ref 0.50–1.35)
GFR calc Af Amer: >90 mL/min (ref >90)
GFR calc non Af Amer: >90 mL/min (ref >90)

## 2012-08-29 MED ORDER — HYDROMORPHONE HCL PF 1 MG/ML IJ SOLN
1.0000 mg | Freq: Once | INTRAMUSCULAR | Status: AC
  Administered 2012-08-29: 1 mg via INTRAVENOUS
  Filled 2012-08-29: qty 1

## 2012-08-29 NOTE — Telephone Encounter (Signed)
Patient called with worsening of swelling and pain of Leg.  Had been in earlier today for ultrasound guided compression of a pseudoanurysm.  I recommended that he come into the ED to be further evaluated, patient agreed.

## 2012-08-29 NOTE — ED Notes (Signed)
Patient transported to CT 

## 2012-08-29 NOTE — ED Notes (Signed)
NAD at time of d/c. Pt ambulating in hallway without difficulty. No complaints of pain.

## 2012-08-29 NOTE — ED Notes (Signed)
Patient requesting more pain medication at this time. Patient states he did get relief, but pain is back at 9/10. EDP notified patient is requesting pain medication. Patient laying on stretcher, no acute distress, resp are even and unlabored. Call light at bedside. Will continue to monitor.

## 2012-08-29 NOTE — Progress Notes (Signed)
*  PRELIMINARY RESULTS* Vascular Ultrasound Left pseudoaneurysm recheck has been completed.  Preliminary findings: Pseudo has not reopened. Compression from 08/28/12 remains successful.  Called report to RN.  Farrel Demark, RDMS, RVT  08/29/2012, 8:24 AM

## 2012-08-29 NOTE — ED Notes (Signed)
Patient laying on stretcher at this time. Patient complaining of pain left leg up into hip. Patient had stent placed from left femoral. Patient having swelling mild swelling to left thigh and knee area. Area is bruised around site and down the inner part of thigh area. Patient rates pain 9/10 at this time and describes as sharp. Patient is in no acute distress, resp are even and unlabored. Call light at bedside. Will continue to monitor

## 2012-08-29 NOTE — ED Notes (Signed)
Patient brought in by EMS from home, called for left hip pain. Patient did have left and right femoral stents placed. Patient states he was up getting water and noticed he was having pain. Ambulated with assistance per EMS. No drainage noted from site from EMS. Patient reports swelling to knee. Denies any n/v, shortness of breath or chest pain. Awake and oriented.

## 2012-08-29 NOTE — Consult Note (Signed)
CARDIOLOGY CONSULT NOTE  Patient ID: EDD REPPERT  MRN: [id number]  DOB: 01/19/1971  Admit date: 08/29/2012 Date of Consult: 08/29/2012  Primary Physician: No PCP Per Patient Primary Cardiologist: Dr. Nechama [name]   Reason for Consultation: Return to ED for LLE pain and swelling s/p L iliac stent 4/15  History of Present Illness: Steven [name] is a 42 y.o. male s/p stenting of L common iliac artery by Dr. Eldridge [name] on 4/15.   He presented to the ED the night of the procedure with pain and swelling and found to have a 2.5cm pseudoaneurysm.  He had ultrasound guided compression by the vascular lab yesterday morning.  However, he called in at around midnight tonight with increased groin pain and swelling, and presents to the ED for further evaluation.  Past Medical History  Diagnosis Date  . Coronary artery disease   . Hypertension   . Hypercholesteremia   . Kidney stone   . Tobacco use disorder   . Hx of CABG     Past Surgical History  Procedure Laterality Date  . Coronary artery bypass graft    . Kidney stone surgery       Home Meds: Prior to Admission medications   Medication Sig Start Date End Date Taking? Authorizing Provider  aspirin 81 MG chewable tablet Chew 1 tablet (81 mg total) by mouth daily. 08/16/12  Yes Steven Pia, MD  atorvastatin (LIPITOR) 80 MG tablet Take 1 tablet (80 mg total) by mouth daily at 6 PM. 08/15/12  Yes Steven Pia, MD  clopidogrel (PLAVIX) 75 MG tablet Take 1 tablet (75 mg total) by mouth daily with breakfast. 08/16/12  Yes Steven Pia, MD  nitroGLYCERIN (NITROSTAT) 0.4 MG SL tablet Place 1 tablet (0.4 mg total) under the tongue every 5 (five) minutes as needed for chest pain. 08/15/12  Yes Steven Pia, MD  oxyCODONE-acetaminophen (PERCOCET) 10-325 MG per tablet Take 1 tablet by mouth every 4 (four) hours as needed for pain. 08/28/12  Yes Steven J Rhyne, PA-C      Allergies:   No Known Allergies  Social History:   The patient  reports that he has been  smoking.  He does not have any smokeless tobacco history on file. He reports that  drinks alcohol. He reports that he uses illicit drugs (Cocaine and Marijuana).    Family History:   The patient's family history is not on file.   ROS:  Please see the history of present illness.   All other systems reviewed and negative.   Vital Signs: Blood pressure 113/79, pulse 79, temperature 99 F (37.2 C), temperature source Oral, resp. rate 18, SpO2 98.00%.   PHYSICAL EXAM: General:  Well nourished, well developed, in no acute distress HEENT: normal Lymph: no adenopathy Neck: no JVD Endocrine:  No thryomegaly Vascular: Extensive echymosis noted in L groin but no bruit appreciated. Leg does not appear to be swollen distal to groin, although patient says the swelling is extending to his knee.  Very tender.  No bruit. Cardiac:  normal S1, S2; RRR; no murmur Lungs:  clear to auscultation bilaterally, no wheezing, rhonchi or rales Abd: LLQ tenderness to moderate palpation Ext: no edema Musculoskeletal:  No deformities, BUE and BLE strength normal and equal Skin: warm and dry Neuro:  CNs 2-12 intact, no focal abnormalities noted Psych:  Normal affect    Labs: No results found for this basename: CKTOTAL, CKMB, TROPONINI,  in the last 72 hours Lab Results  Component Value Date  WBC 8.1 08/14/2012   HGB 12.2* 08/27/2012   HCT 36.0* 08/27/2012   MCV 91.7 08/14/2012   PLT 258 08/14/2012    Recent Labs Lab 08/27/12 1539  NA 143  K 3.6  CL 105  BUN 8  CREATININE 1.00  GLUCOSE 95   Lab Results  Component Value Date   CHOL 207* 08/14/2012   HDL 32* 08/14/2012   LDLCALC 104* 08/14/2012   TRIG 353* 08/14/2012   Lab Results  Component Value Date   DDIMER 0.33 08/14/2012    Radiology/Studies:  Dg Chest 2 View  08/14/2012  *RADIOLOGY REPORT*  Clinical Data: Chest pain  CHEST - 2 VIEW  Comparison: 11/27/2011  Findings: Pain cardiomediastinal silhouette is stable.  Status post CABG.  No acute infiltrate  or pleural effusion.  No pulmonary edema.  Bony thorax is stable.  IMPRESSION: No active disease.  Status post CABG.   Original Report Authenticated By: Steven [name], M.D.     ASSESSMENT AND PLAN:  1. L common femoral pseudoaneurysm with continued pain and ? Increased swelling. - Given abdominal pain would check a CT A/P - repeat vascular ultrasound in the morning - follow up CBC - pain control with IV dilaudid  - touch base with daytime team after imaging studies regarding need for admission  Steven [name] 08/29/2012 4:52 AM

## 2012-08-29 NOTE — ED Notes (Signed)
Pt ambulated to nurse desk without difficulty dressed in his clothing stating he was leaving at 0900. His ride would be here at this time. Pt ask about his pain and he replied "its gone" ED-P notified.

## 2012-08-29 NOTE — ED Notes (Signed)
Dr Jon Billings at bedside with patient.

## 2012-08-29 NOTE — ED Provider Notes (Signed)
History     CSN: [id number]  Arrival date & time 08/29/12  0214   First MD Initiated Contact with Patient 08/29/12 0309      Chief Complaint  Patient presents with  . Hip Pain    (Consider location/radiation/quality/duration/timing/severity/associated sxs/prior treatment) HPI 42 year old male presents to the emergency department via EMS from home with complaint of persistent left groin pain, and swelling.  Patient had a catheterization through his left groin on Monday 4/14 with placement of left common iliac stent.  Patient reports the next day he began with swelling and pain in his left groin.  He was seen in the emergency department 4/15 and diagnosed with a pseudoaneurysm.  He came back Wednesday,4/16, and had compression therapy done with closure of the pseudoaneurysm.  He reports following his instructions closely, staying in the bed, and not moving after his procedure.  He got up it midnight to use the restroom and had return of severe left groin pain, and swelling.  He called the on-call cardiologist, to recommended he come to the emergency department.  Patient reports he last took his oxycodone at 7 PM.  He reports "that oxycodone won't touch my current pain."   Patient is also complaining of diffuse lower abdominal pain as well.  No fevers no chills.  No nausea no vomiting.  Urination is normal.  Past Medical History  Diagnosis Date  . Coronary artery disease   . Hypertension   . Hypercholesteremia   . Kidney stone   . Tobacco use disorder   . Hx of CABG     Past Surgical History  Procedure Laterality Date  . Coronary artery bypass graft    . Kidney stone surgery      History reviewed. No pertinent family history.  History  Substance Use Topics  . Smoking status: Current Every Day Smoker -- 1.00 packs/day for 31 years  . Smokeless tobacco: Not on file  . Alcohol Use: Yes     Comment: 12pk/day on weekends      Review of Systems  See History of Present Illness;  otherwise all other systems are reviewed and negative Allergies  Review of patient's allergies indicates no known allergies.  Home Medications   Current Outpatient Rx  Name  Route  Sig  Dispense  Refill  . aspirin 81 MG chewable tablet   Oral   Chew 1 tablet (81 mg total) by mouth daily.   30 tablet   0   . atorvastatin (LIPITOR) 80 MG tablet   Oral   Take 1 tablet (80 mg total) by mouth daily at 6 PM.   30 tablet   0   . clopidogrel (PLAVIX) 75 MG tablet   Oral   Take 1 tablet (75 mg total) by mouth daily with breakfast.   30 tablet   0   . nitroGLYCERIN (NITROSTAT) 0.4 MG SL tablet   Sublingual   Place 1 tablet (0.4 mg total) under the tongue every 5 (five) minutes as needed for chest pain.   15 tablet   0   . oxyCODONE-acetaminophen (PERCOCET) 10-325 MG per tablet   Oral   Take 1 tablet by mouth every 4 (four) hours as needed for pain.   10 tablet   0     BP 118/77  Pulse 94  Temp(Src) 99 F (37.2 C) (Oral)  Resp 18  SpO2 94%  Physical Exam  Nursing note and vitals reviewed. Constitutional: He is oriented to person, place, and time. He appears  well-developed and well-nourished. He appears distressed (uncomfortable appearing).  HENT:  Head: Normocephalic and atraumatic.  Nose: Nose normal.  Mouth/Throat: Oropharynx is clear and moist.  Eyes: Conjunctivae and EOM are normal. Pupils are equal, round, and reactive to light.  Neck: Normal range of motion. Neck supple. No JVD present. No tracheal deviation present. No thyromegaly present.  Cardiovascular: Normal rate, regular rhythm, normal heart sounds and intact distal pulses.  Exam reveals no gallop and no friction rub.   No murmur heard. Pulmonary/Chest: Effort normal and breath sounds normal. No stridor. No respiratory distress. He has no wheezes. He has no rales. He exhibits no tenderness.  Abdominal: Soft. Bowel sounds are normal. He exhibits no distension and no mass. There is tenderness (mild diffuse  lower abdominal tenderness). There is no rebound and no guarding.  Musculoskeletal: Normal range of motion. He exhibits no edema and no tenderness.  Left groin, with significant ecchymosis from the left iliac crest over to the left portion of his scrotum, down to his mid thigh.  Dressing is clean, dry and intact.  There is no breakout auscultated, patient has significant tenderness throughout the ecchymotic area.  There is no thrill noted.  Patient has palpable femoral, DP and PT pulses on the left side.  He has normal sensation throughout.  The left leg.  Capillary refill is less than 2 seconds.  Patient is able to move his left leg normally aside from difficulties with movement at the left hip secondary to pain  Lymphadenopathy:    He has no cervical adenopathy.  Neurological: He is alert and oriented to person, place, and time. He exhibits normal muscle tone. Coordination normal.  Skin: Skin is warm and dry. No rash noted. No erythema. No pallor.  Psychiatric: He has a normal mood and affect. His behavior is normal. Judgment and thought content normal.    ED Course  Procedures (including critical care time)  Labs Reviewed  CBC WITH DIFFERENTIAL - Abnormal; Notable for the following:    RBC 3.42 (*)    Hemoglobin 11.0 (*)    HCT 31.3 (*)    Eosinophils Relative 6 (*)    All other components within normal limits  BASIC METABOLIC PANEL - Abnormal; Notable for the following:    Glucose, Bld 102 (*)    All other components within normal limits   No results found.   1. Left groin pain   2. Traumatic ecchymosis of groin, subsequent encounter       MDM  42 year old male with left groin pain, and swelling after recent catheterization.  Patient status post pseudoaneurysm treatment yesterday.  Discussed with on-call cardiology fellow, who recommends CT scan of abdomen, pelvis given his abdominal pain for possible retroperitoneal hematoma.  Also recommends vascular study of his left groin to  assess for return of his pseudoaneurysm.  Patient is requiring multiple doses of pain medication to get him comfortable.  He has been updated on plan for this ED visit.  No signs of limb ischemia at this time        Olivia Mackie, MD 08/29/12 [phone]

## 2012-08-29 NOTE — Progress Notes (Signed)
Agree with above.  Waverly Ferrari, MD, FACS Beeper [phone] 08/29/2012

## 2012-08-29 NOTE — H&P (Signed)
[facility] [place]  Patient name: Steven [name] MRN: [id number] DOB: 08/18/1970 Sex: male  REASON FOR CONSULT: Pseudoaneurysm of left common femoral artery. Consults us from Dr. Jay Varanasi  HPI: Steven [name] is a 42 y.o. male who underwent arteriogram yesterday and successful angioplasty and stenting of the left common iliac artery by Dr. Varanasi. Of note this was done through a 7 French sheath on the left. He left the hospital last night he was having some moderate discomfort in his left groin. This persisted. He noticed some ecchymosis in the groin and the office told him to come to the emergency department. He was evaluated by the emergency department and a duplex scan of the left groin was ordered which showed a 2.5 cm pseudoaneurysm originating from the left common femoral artery. Vascular surgery was consulted. He states he's had constant pain since the procedure but no significant change in the character of his pain. Prior to this he admitted to left hip claudication. He denies any history of rest pain or nonhealing ulcers.  His risk factors for peripheral vascular disease include hypertension, hypercholesterolemia, and a history of tobacco use. He denies any history of diabetes or history of premature cardiovascular disease.  Past Medical History  Diagnosis Date  . Coronary artery disease   . Hypertension   . Hypercholesteremia   . Kidney stone   . Tobacco use disorder     FAMILY HISTORY: He denies any family history of premature cardiovascular disease.  SOCIAL HISTORY: History  Substance Use Topics  . Smoking status: Current Every Day Smoker -- 1.00 packs/day for 31 years  . Smokeless tobacco: Not on file  . Alcohol Use: Yes     Comment: 12pk/day on weekends    No Known Allergies  No current facility-administered medications for this encounter.   Current Outpatient Prescriptions  Medication Sig Dispense Refill  . aspirin 81 MG chewable tablet  Chew 1 tablet (81 mg total) by mouth daily.  30 tablet  0  . atorvastatin (LIPITOR) 80 MG tablet Take 1 tablet (80 mg total) by mouth daily at 6 PM.  30 tablet  0  . clopidogrel (PLAVIX) 75 MG tablet Take 1 tablet (75 mg total) by mouth daily with breakfast.  30 tablet  0  . nitroGLYCERIN (NITROSTAT) 0.4 MG SL tablet Place 1 tablet (0.4 mg total) under the tongue every 5 (five) minutes as needed for chest pain.  15 tablet  0  . oxyCODONE-acetaminophen (PERCOCET) 10-325 MG per tablet Take 1 tablet by mouth every 4 (four) hours as needed for pain.  30 tablet  0    REVIEW OF SYSTEMS: [X ] denotes positive finding; [  ] denotes negative finding CARDIOVASCULAR:  [ ] chest pain   [ ] chest pressure   [ ] palpitations   [ ] orthopnea   [X ] dyspnea on exertion   [ ] claudication   [ ] rest pain   [ ] DVT   [ ] phlebitis PULMONARY:   [ ] productive cough   [ ] asthma   [ ] wheezing NEUROLOGIC:   [ ] weakness  [ ] paresthesias  [ ] aphasia  [ ] amaurosis  [ ] dizziness HEMATOLOGIC:   [ ] bleeding problems   [ ] clotting disorders MUSCULOSKELETAL:  [ ] joint pain   [ ] joint swelling [ ] leg swelling GASTROINTESTINAL: [ ]  blood in stool  [ ]  hematemesis GENITOURINARY:  [ ]    dysuria  [ ]  hematuria PSYCHIATRIC:  [ ] history of major depression INTEGUMENTARY:  [ ] rashes  [ ] ulcers CONSTITUTIONAL:  [ ] fever   [ ] chills  PHYSICAL EXAM: Filed Vitals:   08/27/12 1200 08/27/12 1219 08/27/12 1222  BP: 143/87  137/85  Pulse: 98  96  Temp:  97.9 F (36.6 C) 97.9 F (36.6 C)  TempSrc:   Oral  Resp: 16  18  SpO2: 100%  98%   There is no weight on file to calculate BMI. GENERAL: The patient is a well-nourished male, in no acute distress. The vital signs are documented above. CARDIOVASCULAR: There is a regular rate and rhythm. I do not detect carotid bruits. He has a palpable right femoral pulse. He is tender in the left groin so I did not push hard enough to palpate a left femoral pulse. I did not  appreciate femoral bruits. He has palpable popliteal dorsalis pedis and posterior tibial pulses bilaterally. He has no significant lower extremity swelling. PULMONARY: There is good air exchange bilaterally without wheezing or rales. ABDOMEN: Soft and non-tender with normal pitched bowel sounds.  MUSCULOSKELETAL: There are no major deformities or cyanosis. NEUROLOGIC: No focal weakness or paresthesias are detected. SKIN: he has ecchymosis in the left groin. There is a small palpable mass. PSYCHIATRIC: The patient has a normal affect.  DATA:  Lab Results  Component Value Date   WBC 8.1 08/14/2012   HGB 12.2* 08/27/2012   HCT 36.0* 08/27/2012   MCV 91.7 08/14/2012   PLT 258 08/14/2012   Lab Results  Component Value Date   NA 143 08/27/2012   K 3.6 08/27/2012   CL 105 08/27/2012   CO2 24 08/14/2012   Lab Results  Component Value Date   CREATININE 1.00 08/27/2012   No results found for this basename: INR, PROTIME   Lab Results  Component Value Date   HGBA1C 5.9* 08/14/2012   I have independently interpreted his duplex scan which shows a 2.5 cm pseudoaneurysm originating from the left common femoral artery. The neck is approximately 6 mm in length.  I reviewed his arteriogram which shows no significant plaque in the common femoral artery on the left.  MEDICAL ISSUES: PSEUDOANEURYSM OF LEFT COMMON FEMORAL ARTERY: This patient has a small pseudoaneurysm originating off the left common femoral artery. I've explained that there are several options for treating this. I would favor duplex directed manual occlusion as the safest approach. If this were not successful he could potentially be considered for thrombin injection. If neither was successful he could be considered for open surgical repair. I've explained it the main risk with surgical repair is a significant risk of wound healing problems and infection he is willing to proceed with duplex directed manual occlusion of the pseudoaneurysm which will  be scheduled for tomorrow. He understands that he'll need to be sedated for this to control the pain. If this is not successful we can consider thrombin injection or open repair.  DICKSON,CHRISTOPHER S [facility] Beeper: [phone]     open repair.  DICKSON,CHRISTOPHER S  [facility] [place]  Beeper: [phone]

## 2012-08-29 NOTE — ED Notes (Signed)
Pt reports on Tuesday he had vascular US and it showed a leak in the left femoral area. He reports pressure had to be applied to area.

## 2012-09-06 ENCOUNTER — Encounter (HOSPITAL_COMMUNITY): Payer: Self-pay

## 2012-10-01 ENCOUNTER — Encounter: Payer: Self-pay | Admitting: Vascular Surgery

## 2012-10-02 ENCOUNTER — Ambulatory Visit: Payer: Self-pay | Admitting: Vascular Surgery

## 2012-10-08 ENCOUNTER — Emergency Department (HOSPITAL_COMMUNITY): Payer: Self-pay

## 2012-10-08 ENCOUNTER — Encounter (HOSPITAL_COMMUNITY): Payer: Self-pay

## 2012-10-08 ENCOUNTER — Emergency Department (HOSPITAL_COMMUNITY)
Admission: EM | Admit: 2012-10-08 | Discharge: 2012-10-08 | Disposition: A | Discharge status: Home / Self Care | Payer: Self-pay | Attending: Emergency Medicine | Admitting: Emergency Medicine

## 2012-10-08 DIAGNOSIS — Z87442 Personal history of urinary calculi: Secondary | ICD-10-CM | POA: Insufficient documentation

## 2012-10-08 DIAGNOSIS — Z9861 Coronary angioplasty status: Secondary | ICD-10-CM | POA: Insufficient documentation

## 2012-10-08 DIAGNOSIS — I10 Essential (primary) hypertension: Secondary | ICD-10-CM | POA: Insufficient documentation

## 2012-10-08 DIAGNOSIS — F411 Generalized anxiety disorder: Secondary | ICD-10-CM | POA: Insufficient documentation

## 2012-10-08 DIAGNOSIS — F17200 Nicotine dependence, unspecified, uncomplicated: Secondary | ICD-10-CM | POA: Insufficient documentation

## 2012-10-08 DIAGNOSIS — Z7902 Long term (current) use of antithrombotics/antiplatelets: Secondary | ICD-10-CM | POA: Insufficient documentation

## 2012-10-08 DIAGNOSIS — R1032 Left lower quadrant pain: Secondary | ICD-10-CM

## 2012-10-08 DIAGNOSIS — R109 Unspecified abdominal pain: Secondary | ICD-10-CM | POA: Insufficient documentation

## 2012-10-08 DIAGNOSIS — Z79899 Other long term (current) drug therapy: Secondary | ICD-10-CM | POA: Insufficient documentation

## 2012-10-08 DIAGNOSIS — Z951 Presence of aortocoronary bypass graft: Secondary | ICD-10-CM | POA: Insufficient documentation

## 2012-10-08 DIAGNOSIS — Z7982 Long term (current) use of aspirin: Secondary | ICD-10-CM | POA: Insufficient documentation

## 2012-10-08 DIAGNOSIS — I2510 Atherosclerotic heart disease of native coronary artery without angina pectoris: Secondary | ICD-10-CM | POA: Insufficient documentation

## 2012-10-08 DIAGNOSIS — E7800 Pure hypercholesterolemia, unspecified: Secondary | ICD-10-CM | POA: Insufficient documentation

## 2012-10-08 LAB — POCT I-STAT, CHEM 8
BUN: 16 mg/dL (ref 6–23)
Calcium, Ion: 1.17 mmol/L (ref 1.12–1.23)
Chloride: 105 mEq/L (ref 96–112)
Creatinine, Ser: 1.1 mg/dL (ref 0.50–1.35)
Glucose, Bld: 93 mg/dL (ref 70–99)

## 2012-10-08 LAB — URINALYSIS, ROUTINE W REFLEX MICROSCOPIC
Bilirubin Urine: NEGATIVE
Ketones, ur: NEGATIVE mg/dL
Nitrite: NEGATIVE
Protein, ur: NEGATIVE mg/dL
Urobilinogen, UA: 0.2 mg/dL (ref 0.0–1.0)

## 2012-10-08 LAB — RPR: RPR Ser Ql: NONREACTIVE

## 2012-10-08 LAB — CBC
MCH: 33.6 pg (ref 26.0–34.0)
MCHC: 35.5 g/dL (ref 30.0–36.0)
MCV: 94.6 fL (ref 78.0–100.0)
Platelets: 294 10*3/uL (ref 150–400)
RDW: 14.2 % (ref 11.5–15.5)

## 2012-10-08 MED ORDER — HYDROMORPHONE HCL PF 1 MG/ML IJ SOLN
1.0000 mg | Freq: Once | INTRAMUSCULAR | Status: AC
  Administered 2012-10-08: 1 mg via INTRAVENOUS
  Filled 2012-10-08: qty 1

## 2012-10-08 MED ORDER — IOHEXOL 300 MG/ML  SOLN
20.0000 mL | INTRAMUSCULAR | Status: AC
  Administered 2012-10-08: 50 mL via ORAL

## 2012-10-08 MED ORDER — ONDANSETRON HCL 4 MG/2ML IJ SOLN
4.0000 mg | Freq: Once | INTRAMUSCULAR | Status: AC
  Administered 2012-10-08: 4 mg via INTRAVENOUS
  Filled 2012-10-08: qty 2

## 2012-10-08 MED ORDER — IOHEXOL 300 MG/ML  SOLN
100.0000 mL | Freq: Once | INTRAMUSCULAR | Status: AC | PRN
  Administered 2012-10-08: 100 mL via INTRAVENOUS

## 2012-10-08 MED ORDER — HYDROCODONE-ACETAMINOPHEN 5-325 MG PO TABS: 1.0000 | ORAL_TABLET | Freq: Four times a day (QID) | ORAL | Status: DC | PRN

## 2012-10-08 NOTE — ED Notes (Signed)
Per [facility] pt from home, pt had a Left stent placed 5 weeks ago, pt reports increase pain at the site of stent, pt denies infection, bleeding, purulent draining, or bruising. Pt has returned to normal activities and is unsure if he has done too much. VSS BP 170/100, 150/90, HR 80, rr 18,

## 2012-10-10 NOTE — ED Provider Notes (Signed)
History     CSN: [id number]  Arrival date & time 10/08/12  0114   First MD Initiated Contact with Patient 10/08/12 0217      Chief Complaint  Patient presents with  . Abdominal Pain    (Consider location/radiation/quality/duration/timing/severity/associated sxs/prior treatment) HPI Hx per PT - L inguinal pain, hurts to walk, sharp in quality, MOD in severity, a few months ago had stent placed, developed pseudoaneurysm, had been taking pain pills and has now ran out. PT developing worsening pain now, he has a new girlfriend and inquires on chance of STD - no rash, disharge or penile/ testicular tenderness. HE is also concerned about the possibility of a kidney stone with h/o same in the past - no fevers or hematuria. He admits to being very anxious about this.  Past Medical History  Diagnosis Date  . Coronary artery disease   . Hypertension   . Hypercholesteremia   . Kidney stone   . Tobacco use disorder   . Hx of CABG     Past Surgical History  Procedure Laterality Date  . Coronary artery bypass graft    . Kidney stone surgery      History reviewed. No pertinent family history.  History  Substance Use Topics  . Smoking status: Current Every Day Smoker -- 1.00 packs/day for 31 years  . Smokeless tobacco: Not on file  . Alcohol Use: Yes     Comment: 12pk/day on weekends      Review of Systems  Constitutional: Negative for fever and chills.  HENT: Negative for neck pain and neck stiffness.   Eyes: Negative for pain.  Respiratory: Negative for shortness of breath.   Cardiovascular: Negative for chest pain.  Gastrointestinal: Negative for vomiting, constipation, blood in stool and anal bleeding.  Genitourinary: Negative for dysuria and penile swelling.  Musculoskeletal: Negative for back pain.  Skin: Negative for rash.  Neurological: Negative for headaches.  All other systems reviewed and are negative.    Allergies  Review of patient's allergies indicates no  known allergies.  Home Medications   Current Outpatient Rx  Name  Route  Sig  Dispense  Refill  . aspirin 81 MG chewable tablet   Oral   Chew 1 tablet (81 mg total) by mouth daily.   30 tablet   0   . atorvastatin (LIPITOR) 80 MG tablet   Oral   Take 1 tablet (80 mg total) by mouth daily at 6 PM.   30 tablet   0   . clopidogrel (PLAVIX) 75 MG tablet   Oral   Take 1 tablet (75 mg total) by mouth daily with breakfast.   30 tablet   0   . metoprolol tartrate (LOPRESSOR) 25 MG tablet   Oral   Take 25 mg by mouth 2 (two) times daily.         . nitroGLYCERIN (NITROSTAT) 0.4 MG SL tablet   Sublingual   Place 1 tablet (0.4 mg total) under the tongue every 5 (five) minutes as needed for chest pain.   15 tablet   0   . HYDROcodone-acetaminophen (NORCO/VICODIN) 5-325 MG per tablet   Oral   Take 1 tablet by mouth every 6 (six) hours as needed for pain.   12 tablet   0     BP 135/77  Pulse 84  Temp(Src) 98.6 F (37 C) (Oral)  Resp 18  SpO2 95%  Physical Exam  Constitutional: He is oriented to person, place, and time. He appears well-developed  and well-nourished.  HENT:  Head: Normocephalic and atraumatic.  Eyes: EOM are normal. Pupils are equal, round, and reactive to light.  Neck: Neck supple.  Cardiovascular: Normal rate, regular rhythm and intact distal pulses.   Pulmonary/Chest: Effort normal and breath sounds normal. No respiratory distress.  Abdominal: Soft. Bowel sounds are normal. He exhibits no distension and no mass. There is no tenderness. There is no rebound and no guarding.  Genitourinary: Penis normal.  TTP L inguinal, no mass, no erythema or inc warmth to touch. No testicular mass or tenderness, no bruit  Musculoskeletal: Normal range of motion. He exhibits no edema.  Neurological: He is alert and oriented to person, place, and time.  Skin: Skin is warm and dry.    ED Course  Procedures (including critical care time)  Results for orders placed  during the hospital encounter of 10/08/12  GC/CHLAMYDIA PROBE AMP      Result Value Range   CT Probe RNA NEGATIVE  NEGATIVE   GC Probe RNA NEGATIVE  NEGATIVE  CBC      Result Value Range   WBC 11.2 (*) 4.0 - 10.5 K/uL   RBC 4.82  4.22 - 5.81 MIL/uL   Hemoglobin 16.2  13.0 - 17.0 g/dL   HCT 16.1  09.6 - 04.5 %   MCV 94.6  78.0 - 100.0 fL   MCH 33.6  26.0 - 34.0 pg   MCHC 35.5  30.0 - 36.0 g/dL   RDW 40.9  81.1 - 91.4 %   Platelets 294  150 - 400 K/uL  URINALYSIS, ROUTINE W REFLEX MICROSCOPIC      Result Value Range   Color, Urine YELLOW  YELLOW   APPearance CLEAR  CLEAR   Specific Gravity, Urine 1.020  1.005 - 1.030   pH 7.0  5.0 - 8.0   Glucose, UA NEGATIVE  NEGATIVE mg/dL   Hgb urine dipstick NEGATIVE  NEGATIVE   Bilirubin Urine NEGATIVE  NEGATIVE   Ketones, ur NEGATIVE  NEGATIVE mg/dL   Protein, ur NEGATIVE  NEGATIVE mg/dL   Urobilinogen, UA 0.2  0.0 - 1.0 mg/dL   Nitrite NEGATIVE  NEGATIVE   Leukocytes, UA NEGATIVE  NEGATIVE  RPR      Result Value Range   RPR NON REACTIVE  NON REACTIVE  POCT I-STAT, CHEM 8      Result Value Range   Sodium 141  135 - 145 mEq/L   Potassium 3.7  3.5 - 5.1 mEq/L   Chloride 105  96 - 112 mEq/L   BUN 16  6 - 23 mg/dL   Creatinine, Ser 7.82  0.50 - 1.35 mg/dL   Glucose, Bld 93  70 - 99 mg/dL   Calcium, Ion 9.56  2.13 - 1.23 mmol/L   TCO2 27  0 - 100 mmol/L   Hemoglobin 16.7  13.0 - 17.0 g/dL   HCT 08.6  57.8 - 46.9 %   Ct Abdomen Pelvis W Contrast  10/08/2012   *RADIOLOGY REPORT*  Clinical Data: Left inguinal pain.  CT ABDOMEN AND PELVIS WITH CONTRAST  Technique:  Multidetector CT imaging of the abdomen and pelvis was performed following the standard protocol during bolus administration of intravenous contrast.  Contrast: OMNIPAQUE IOHEXOL 300 MG/ML  SOLN  Comparison: CT of the abdomen and pelvis performed 08/29/2012  Findings: The visualized lung bases are clear.  Diffuse coronary artery calcifications are seen.  The patient is  status post median sternotomy.  There is mild diffuse fatty infiltration within the  liver.  The spleen is unremarkable in appearance.  The gallbladder is unremarkable in appearance, with a small Phrygian cap seen.  The pancreas and adrenal glands are unremarkable.  A 0.8 cm cyst is noted at the interpole region of the right kidney. The kidneys are otherwise unremarkable in appearance.  There is no evidence of hydronephrosis.  No renal or ureteral stones are seen. No perinephric stranding is appreciated.  No free fluid is identified.  The small bowel is unremarkable in appearance.  The stomach is within normal limits.  No acute vascular abnormalities are seen. Scattered calcification is noted along the aorta and its branches, including along the proximal renal arteries bilaterally.  The appendix is not definitely seen; there is no evidence for appendicitis.  The colon is unremarkable in appearance.  The bladder is mildly distended and grossly unremarkable.  The prostate is mildly enlarged, measuring 5.0 cm in transverse dimension, with scattered calcification.  No inguinal lymphadenopathy is seen.  The previously noted left inguinal pseudoaneurysm has resolved. Minimal soft tissue stranding at the left inguinal region has significantly improved.  There is mild residual soft tissue inflammation along the underlying intraperitoneal fat.  No acute osseous abnormalities are identified.  IMPRESSION:  1.  Previously noted left inguinal pseudoaneurysm has resolved; significant interval improvement in the soft tissue stranding at the left inguinal region.  Mild residual soft tissue inflammation along the underlying intraperitoneal fat.  No evidence of inguinal hernia. 2.  Scattered calcification along the abdominal aorta and its branches, including along the proximal renal arteries bilaterally. 3.  Diffuse coronary artery calcifications seen. 4.  Mild fatty infiltration within the liver. 5.  Small right renal cyst seen. 6.   Mildly enlarged prostate.   Original Report Authenticated By: Tonia Ghent, M.D.    1. Left inguinal pain    IV Dilaudid pain control, STD/ GU probe sent - results pending  Plan f/u PCP, VAS as needed, [facility] Dept for STD results. Rx and precautions provided.  MDM  L inguinal pain, no obvious hernia, no sig abnormality on CT scan, labs reviewed as above, improved with IV narcotics, VS and nursing and prior ER records reviewed.         Sunnie Nielsen, MD 10/10/12 [phone]

## 2013-03-21 ENCOUNTER — Encounter (HOSPITAL_COMMUNITY): Payer: Self-pay | Admitting: Emergency Medicine

## 2013-03-21 ENCOUNTER — Emergency Department (HOSPITAL_COMMUNITY)
Admission: EM | Admit: 2013-03-21 | Discharge: 2013-03-21 | Disposition: A | Discharge status: Home / Self Care | Payer: Self-pay | Attending: Emergency Medicine | Admitting: Emergency Medicine

## 2013-03-21 ENCOUNTER — Emergency Department (HOSPITAL_COMMUNITY): Payer: Self-pay

## 2013-03-21 DIAGNOSIS — I2510 Atherosclerotic heart disease of native coronary artery without angina pectoris: Secondary | ICD-10-CM | POA: Insufficient documentation

## 2013-03-21 DIAGNOSIS — I739 Peripheral vascular disease, unspecified: Secondary | ICD-10-CM | POA: Insufficient documentation

## 2013-03-21 DIAGNOSIS — R0789 Other chest pain: Secondary | ICD-10-CM

## 2013-03-21 DIAGNOSIS — Z87442 Personal history of urinary calculi: Secondary | ICD-10-CM | POA: Insufficient documentation

## 2013-03-21 DIAGNOSIS — Z79899 Other long term (current) drug therapy: Secondary | ICD-10-CM | POA: Insufficient documentation

## 2013-03-21 DIAGNOSIS — F17200 Nicotine dependence, unspecified, uncomplicated: Secondary | ICD-10-CM | POA: Insufficient documentation

## 2013-03-21 DIAGNOSIS — I70219 Atherosclerosis of native arteries of extremities with intermittent claudication, unspecified extremity: Secondary | ICD-10-CM

## 2013-03-21 DIAGNOSIS — R071 Chest pain on breathing: Secondary | ICD-10-CM | POA: Insufficient documentation

## 2013-03-21 DIAGNOSIS — E7800 Pure hypercholesterolemia, unspecified: Secondary | ICD-10-CM | POA: Insufficient documentation

## 2013-03-21 DIAGNOSIS — I10 Essential (primary) hypertension: Secondary | ICD-10-CM | POA: Insufficient documentation

## 2013-03-21 DIAGNOSIS — Z7902 Long term (current) use of antithrombotics/antiplatelets: Secondary | ICD-10-CM | POA: Insufficient documentation

## 2013-03-21 DIAGNOSIS — Z951 Presence of aortocoronary bypass graft: Secondary | ICD-10-CM | POA: Insufficient documentation

## 2013-03-21 LAB — CBC
Hemoglobin: 13.1 g/dL (ref 13.0–17.0)
MCH: 33.7 pg (ref 26.0–34.0)
MCV: 92 fL (ref 78.0–100.0)
Platelets: 270 10*3/uL (ref 150–400)
RBC: 3.89 MIL/uL — ABNORMAL LOW (ref 4.22–5.81)

## 2013-03-21 LAB — BASIC METABOLIC PANEL
CO2: 28 mEq/L (ref 19–32)
Calcium: 9.7 mg/dL (ref 8.4–10.5)
Glucose, Bld: 101 mg/dL — ABNORMAL HIGH (ref 70–99)
Sodium: 140 mEq/L (ref 135–145)

## 2013-03-21 MED ORDER — IBUPROFEN 600 MG PO TABS: 600.0000 mg | ORAL_TABLET | Freq: Four times a day (QID) | ORAL | Status: DC | PRN

## 2013-03-21 MED ORDER — KETOROLAC TROMETHAMINE 60 MG/2ML IM SOLN
60.0000 mg | Freq: Once | INTRAMUSCULAR | Status: AC
  Administered 2013-03-21: 60 mg via INTRAMUSCULAR
  Filled 2013-03-21: qty 2

## 2013-03-21 NOTE — ED Notes (Signed)
Pt states he's had R chest pain for a couple of months and intermittent L sided CP.

## 2013-03-21 NOTE — ED Notes (Signed)
Per pt sts left sided intermittent chest pain radiating in between shoulder blades. sts hurts worse when he breathes in and moves. sts coughing up black stuff x 1 month. sts also having issues with right leg giving out. sts similar to when he had stent placed in left leg.

## 2013-03-21 NOTE — ED Notes (Signed)
Pt presents today with c/o CP x 2 months associated with coughing up "black stuff".  Pt reports that he has a cyst on his lung that used to be 2 cm and increased to 5 cm.  Pt also c/o R leg pain similar to the pain associated with PVD in his L leg prior to having a stent placed.

## 2013-03-21 NOTE — Progress Notes (Signed)
VASCULAR LAB PRELIMINARY  ARTERIAL  ABI completed:    RIGHT    LEFT    PRESSURE WAVEFORM  PRESSURE WAVEFORM  BRACHIAL 148 Triphasisic BRACHIAL 153 Triphasic  DP 155 Biphasic DP 160 Biphasic  PT 175 Triphasic PT 171 Triphasic    RIGHT LEFT  ABI 1.14 1.12   Post 5 Minutes Exercise Bilateral Dorsi Flexion   RIGHT    LEFT       PRESSURE WAVEFORM     BRACHIAL 160 Triphasic  PT 162 Triphasic PT 165 Triphasic    RIGHT LEFT  ABI 1.01 1.03    Resting and Exercise ABIs and Doppler waveforms are within normal limits bilaterally   Shaneeka Scarboro, RVS 03/21/2013, 4:15 PM

## 2013-03-21 NOTE — ED Notes (Signed)
MD at bedside. 

## 2013-03-21 NOTE — ED Provider Notes (Signed)
CSN: [id number]     Arrival date & time 03/21/13  1317 History   First MD Initiated Contact with Patient 03/21/13 1328     Chief Complaint  Patient presents with  . Chest Pain   (Consider location/radiation/quality/duration/timing/severity/associated sxs/prior Treatment) Patient is a 42 y.o. male presenting with chest pain. The history is provided by the patient. No language interpreter was used.  Chest Pain Pain location:  R chest Pain quality: sharp   Pain radiates to:  Does not radiate Pain radiates to the back: no   Pain severity:  Moderate Onset quality:  Sudden Duration:  12 months Timing:  Intermittent Progression:  Worsening (worsening in severity) Chronicity:  Chronic Context: breathing and raising an arm   Context comment:  Bending over Relieved by: bringing down arm, breathing calmly. Exacerbated by: lifint arm up, bending over, deep breathing. Ineffective treatments:  None tried Associated symptoms: claudication (RLE)   Associated symptoms: no abdominal pain, no back pain, no cough, no diaphoresis, no dizziness, no dysphagia, no fatigue, no fever, no headache, no nausea, no numbness, no shortness of breath, no syncope, not vomiting and no weakness   Risk factors: coronary artery disease, high cholesterol, hypertension, male sex and smoking (previous)   Risk factors: no prior DVT/PE     Past Medical History  Diagnosis Date  . Coronary artery disease   . Hypertension   . Hypercholesteremia   . Kidney stone   . Tobacco use disorder   . Hx of CABG    Past Surgical History  Procedure Laterality Date  . Coronary artery bypass graft    . Kidney stone surgery     History reviewed. No pertinent family history. History  Substance Use Topics  . Smoking status: Current Every Day Smoker -- 1.00 packs/day for 31 years  . Smokeless tobacco: Not on file  . Alcohol Use: Yes     Comment: 12pk/day on weekends    Review of Systems  Constitutional: Negative for fever,  diaphoresis, activity change, appetite change and fatigue.  HENT: Negative for congestion, facial swelling, rhinorrhea and trouble swallowing.   Eyes: Negative for photophobia and pain.  Respiratory: Negative for cough, chest tightness and shortness of breath.   Cardiovascular: Positive for chest pain and claudication (RLE). Negative for leg swelling and syncope.  Gastrointestinal: Negative for nausea, vomiting, abdominal pain, diarrhea and constipation.  Endocrine: Negative for polydipsia and polyuria.  Genitourinary: Negative for dysuria, urgency, decreased urine volume and difficulty urinating.  Musculoskeletal: Negative for back pain and gait problem.  Skin: Negative for color change, rash and wound.  Allergic/Immunologic: Negative for immunocompromised state.  Neurological: Negative for dizziness, facial asymmetry, speech difficulty, weakness, numbness and headaches.  Psychiatric/Behavioral: Negative for confusion, decreased concentration and agitation.    Allergies  Review of patient's allergies indicates no known allergies.  Home Medications   Current Outpatient Rx  Name  Route  Sig  Dispense  Refill  . atorvastatin (LIPITOR) 80 MG tablet   Oral   Take 1 tablet (80 mg total) by mouth daily at 6 PM.   30 tablet   0   . clopidogrel (PLAVIX) 75 MG tablet   Oral   Take 1 tablet (75 mg total) by mouth daily with breakfast.   30 tablet   0   . ibuprofen (ADVIL,MOTRIN) 200 MG tablet   Oral   Take 400 mg by mouth every 6 (six) hours as needed for mild pain.         . metoprolol  tartrate (LOPRESSOR) 25 MG tablet   Oral   Take 25 mg by mouth 2 (two) times daily.         . nitroGLYCERIN (NITROSTAT) 0.4 MG SL tablet   Sublingual   Place 1 tablet (0.4 mg total) under the tongue every 5 (five) minutes as needed for chest pain.   15 tablet   0   . ibuprofen (ADVIL,MOTRIN) 600 MG tablet   Oral   Take 1 tablet (600 mg total) by mouth every 6 (six) hours as needed.   20  tablet   0    BP 137/69  Pulse 77  Resp 15  SpO2 99% Physical Exam  Constitutional: He is oriented to person, place, and time. He appears well-developed and well-nourished. No distress.  HENT:  Head: Normocephalic and atraumatic.  Mouth/Throat: No oropharyngeal exudate.  Eyes: Pupils are equal, round, and reactive to light.  Neck: Normal range of motion. Neck supple.  Cardiovascular: Normal rate, regular rhythm and normal heart sounds.  Exam reveals no gallop and no friction rub.   No murmur heard. Pulmonary/Chest: Effort normal and breath sounds normal. No respiratory distress. He has no wheezes. He has no rales.    Abdominal: Soft. Bowel sounds are normal. He exhibits no distension and no mass. There is no tenderness. There is no rebound and no guarding.  Musculoskeletal: Normal range of motion. He exhibits no edema and no tenderness.  2+ PT/DP on R, 1+ on left  Neurological: He is alert and oriented to person, place, and time.  Skin: Skin is warm and dry.  Psychiatric: He has a normal mood and affect.    ED Course  Procedures (including critical care time) Labs Review Labs Reviewed  BASIC METABOLIC PANEL - Abnormal; Notable for the following:    Glucose, Bld 101 (*)    All other components within normal limits  CBC - Abnormal; Notable for the following:    RBC 3.89 (*)    HCT 35.8 (*)    MCHC 36.6 (*)    All other components within normal limits  D-DIMER, QUANTITATIVE  POCT I-STAT TROPONIN I   Imaging Review Dg Chest 2 View  03/21/2013   CLINICAL DATA:  Worsening right chest pain for 1 year. Right flank pain.  EXAM: CHEST  2 VIEW  COMPARISON:  Multiple priors  FINDINGS: The cardiomediastinal silhouette is unchanged. No focal infiltrate or edema. No pleural effusion or pneumothorax. No acute osseous abnormality.  IMPRESSION: No active cardiopulmonary disease.   Electronically Signed   By: Jerene Dilling M.D.   On: 03/21/2013 13:47    EKG Interpretation      Ventricular Rate:  101 PR Interval:  154 QRS Duration: 102 QT Interval:  342 QTC Calculation: 443 R Axis:   53 Text Interpretation:  Sinus tachycardia Possible Left atrial enlargement Incomplete right bundle branch block .  New tachycardia, but otherwise grossly unchanged from prior.             MDM   1. Right-sided chest wall pain   2. Claudication of right lower extremity    Pt is a 42 y.o. male with Pmhx as above who presents with 1 year of R sided CP w/ mvmt of arm, bending, or deep breathing, with more severe pain last 2-3 months. Does not have assoc n/v, diaphoresis, SOB, fever, cough.  He has occasional R leg swelling, none currently. Pt has no pain is still and breathing calmly.  Pulm exam benign. +localized area of ttp  R chest wall.  Cardiac exam otherwise benign, no tachycardia on my exam. Pt has  claudication-like symptoms of RLE at approx 142m ambulation which is similar to LLE symptoms he has in past which were releaved by stenting 4/14 by Dr. Eldridge Dace. ABI done which were nml. CXR unremarkable, trop negative. D-dimer negative, PERC criteria negative. Symptoms would be very atypical for ACS considering time frame and aggravating symptoms. I more strongly suspect costochondritis. Doubt PE given nml HR, neg d-dimer, no LE edema and timing of symptoms.  I believe pt safe for d/c with close f/u with cardiology.          Shanna Cisco, MD 03/21/13 [phone]

## 2013-03-21 NOTE — ED Notes (Signed)
Pt to radiology.

## 2013-04-08 ENCOUNTER — Encounter: Payer: Self-pay | Admitting: Interventional Cardiology

## 2013-04-11 ENCOUNTER — Encounter: Payer: Self-pay | Admitting: Interventional Cardiology

## 2013-04-27 ENCOUNTER — Emergency Department (HOSPITAL_COMMUNITY): Payer: Self-pay

## 2013-04-27 ENCOUNTER — Observation Stay (HOSPITAL_COMMUNITY)
Admission: EM | Admit: 2013-04-27 | Discharge: 2013-04-28 | Disposition: A | Discharge status: Home / Self Care | Payer: Self-pay | Attending: Family Medicine | Admitting: Family Medicine

## 2013-04-27 ENCOUNTER — Encounter (HOSPITAL_COMMUNITY): Payer: Self-pay | Admitting: Emergency Medicine

## 2013-04-27 DIAGNOSIS — F1910 Other psychoactive substance abuse, uncomplicated: Secondary | ICD-10-CM | POA: Insufficient documentation

## 2013-04-27 DIAGNOSIS — I10 Essential (primary) hypertension: Secondary | ICD-10-CM | POA: Diagnosis present

## 2013-04-27 DIAGNOSIS — F17200 Nicotine dependence, unspecified, uncomplicated: Secondary | ICD-10-CM | POA: Diagnosis present

## 2013-04-27 DIAGNOSIS — Z951 Presence of aortocoronary bypass graft: Secondary | ICD-10-CM | POA: Insufficient documentation

## 2013-04-27 DIAGNOSIS — F1010 Alcohol abuse, uncomplicated: Secondary | ICD-10-CM | POA: Insufficient documentation

## 2013-04-27 DIAGNOSIS — I739 Peripheral vascular disease, unspecified: Secondary | ICD-10-CM | POA: Diagnosis present

## 2013-04-27 DIAGNOSIS — E785 Hyperlipidemia, unspecified: Secondary | ICD-10-CM | POA: Diagnosis present

## 2013-04-27 DIAGNOSIS — Z9119 Patient's noncompliance with other medical treatment and regimen: Secondary | ICD-10-CM | POA: Insufficient documentation

## 2013-04-27 DIAGNOSIS — Z91199 Patient's noncompliance with other medical treatment and regimen due to unspecified reason: Secondary | ICD-10-CM | POA: Insufficient documentation

## 2013-04-27 DIAGNOSIS — J9600 Acute respiratory failure, unspecified whether with hypoxia or hypercapnia: Principal | ICD-10-CM

## 2013-04-27 DIAGNOSIS — I2510 Atherosclerotic heart disease of native coronary artery without angina pectoris: Secondary | ICD-10-CM | POA: Insufficient documentation

## 2013-04-27 DIAGNOSIS — R0902 Hypoxemia: Secondary | ICD-10-CM | POA: Diagnosis present

## 2013-04-27 DIAGNOSIS — J209 Acute bronchitis, unspecified: Secondary | ICD-10-CM

## 2013-04-27 LAB — BASIC METABOLIC PANEL
BUN: 9 mg/dL (ref 6–23)
Calcium: 9.5 mg/dL (ref 8.4–10.5)
Chloride: 102 mEq/L (ref 96–112)
Creatinine, Ser: 1.14 mg/dL (ref 0.50–1.35)
GFR calc Af Amer: 89 mL/min — ABNORMAL LOW (ref >90)
GFR calc non Af Amer: 77 mL/min — ABNORMAL LOW (ref >90)
Sodium: 141 mEq/L (ref 135–145)

## 2013-04-27 LAB — CBC
HCT: 39.8 % (ref 39.0–52.0)
MCH: 34.4 pg — ABNORMAL HIGH (ref 26.0–34.0)
MCHC: 35.2 g/dL (ref 30.0–36.0)
MCV: 97.8 fL (ref 78.0–100.0)
Platelets: 316 10*3/uL (ref 150–400)
RDW: 13.5 % (ref 11.5–15.5)

## 2013-04-27 LAB — PRO B NATRIURETIC PEPTIDE: Pro B Natriuretic peptide (BNP): 216.9 pg/mL — ABNORMAL HIGH (ref 0–125)

## 2013-04-27 MED ORDER — IPRATROPIUM BROMIDE 0.02 % IN SOLN: 0.5000 mg | Freq: Four times a day (QID) | RESPIRATORY_TRACT | Status: DC | PRN

## 2013-04-27 MED ORDER — ONDANSETRON HCL 4 MG/2ML IJ SOLN: 4.0000 mg | Freq: Three times a day (TID) | INTRAMUSCULAR | Status: AC | PRN

## 2013-04-27 MED ORDER — ALBUTEROL SULFATE (5 MG/ML) 0.5% IN NEBU
5.0000 mg | INHALATION_SOLUTION | Freq: Once | RESPIRATORY_TRACT | Status: AC
  Administered 2013-04-27: 5 mg via RESPIRATORY_TRACT

## 2013-04-27 MED ORDER — ALBUTEROL SULFATE (5 MG/ML) 0.5% IN NEBU
5.0000 mg | INHALATION_SOLUTION | RESPIRATORY_TRACT | Status: DC
  Administered 2013-04-27: 5 mg via RESPIRATORY_TRACT
  Filled 2013-04-27: qty 1

## 2013-04-27 MED ORDER — IPRATROPIUM BROMIDE 0.02 % IN SOLN
0.5000 mg | Freq: Once | RESPIRATORY_TRACT | Status: AC
  Administered 2013-04-27: 0.5 mg via RESPIRATORY_TRACT
  Filled 2013-04-27: qty 2.5

## 2013-04-27 MED ORDER — SULFAMETHOXAZOLE-TMP DS 800-160 MG PO TABS
1.0000 | ORAL_TABLET | Freq: Two times a day (BID) | ORAL | Status: DC
  Administered 2013-04-27 – 2013-04-28 (×2): 1 via ORAL
  Filled 2013-04-27 (×3): qty 1

## 2013-04-27 MED ORDER — PREDNISONE 20 MG PO TABS
60.0000 mg | ORAL_TABLET | Freq: Once | ORAL | Status: AC
  Administered 2013-04-27: 60 mg via ORAL
  Filled 2013-04-27: qty 3

## 2013-04-27 MED ORDER — ASPIRIN 81 MG PO CHEW
81.0000 mg | CHEWABLE_TABLET | Freq: Every day | ORAL | Status: DC
  Administered 2013-04-27 – 2013-04-28 (×2): 81 mg via ORAL
  Filled 2013-04-27 (×2): qty 1

## 2013-04-27 MED ORDER — HEPARIN SODIUM (PORCINE) 5000 UNIT/ML IJ SOLN
5000.0000 [IU] | Freq: Three times a day (TID) | INTRAMUSCULAR | Status: DC
  Administered 2013-04-27 – 2013-04-28 (×2): 5000 [IU] via SUBCUTANEOUS
  Filled 2013-04-27 (×5): qty 1

## 2013-04-27 MED ORDER — ALBUTEROL SULFATE (5 MG/ML) 0.5% IN NEBU
2.5000 mg | INHALATION_SOLUTION | Freq: Three times a day (TID) | RESPIRATORY_TRACT | Status: DC
  Administered 2013-04-28: 09:00:00 2.5 mg via RESPIRATORY_TRACT
  Filled 2013-04-27: qty 0.5

## 2013-04-27 MED ORDER — ALBUTEROL SULFATE (5 MG/ML) 0.5% IN NEBU: 2.5000 mg | INHALATION_SOLUTION | RESPIRATORY_TRACT | Status: DC | PRN

## 2013-04-27 MED ORDER — LISINOPRIL 2.5 MG PO TABS
2.5000 mg | ORAL_TABLET | Freq: Every day | ORAL | Status: DC
  Administered 2013-04-27 – 2013-04-28 (×2): 2.5 mg via ORAL
  Filled 2013-04-27 (×2): qty 1

## 2013-04-27 MED ORDER — SODIUM CHLORIDE 0.9 % IJ SOLN
3.0000 mL | Freq: Two times a day (BID) | INTRAMUSCULAR | Status: DC
  Administered 2013-04-27 – 2013-04-28 (×2): 3 mL via INTRAVENOUS

## 2013-04-27 MED ORDER — NITROGLYCERIN 0.4 MG SL SUBL: 0.4000 mg | SUBLINGUAL_TABLET | SUBLINGUAL | Status: DC | PRN

## 2013-04-27 MED ORDER — ACETAMINOPHEN 325 MG PO TABS
650.0000 mg | ORAL_TABLET | Freq: Four times a day (QID) | ORAL | Status: DC | PRN
  Administered 2013-04-27 – 2013-04-28 (×2): 650 mg via ORAL
  Filled 2013-04-27 (×2): qty 2

## 2013-04-27 NOTE — ED Provider Notes (Signed)
Medical screening examination/treatment/procedure(s) were conducted as a shared visit with non-physician practitioner(s) and myself.  I personally evaluated the patient during the encounter.   Please see my separate note.     Candyce Churn, MD 04/27/13 [phone]

## 2013-04-27 NOTE — ED Notes (Signed)
Pt states hx of 4 bypasses.  Was told that he has a cyst in his lung.  Stated that he began coughing up a think white substance.  States that it looks like "he's been hanging sheet rock all day".  States his CP started yesterday.  Centralized w/ radiation to rt shoulder blade.

## 2013-04-27 NOTE — ED Provider Notes (Signed)
CSN: [id number]     Arrival date & time 04/27/13  1416 History   First MD Initiated Contact with Patient 04/27/13 1508     Chief Complaint  Patient presents with  . Shortness of Breath  . 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 He c/o central CP as well for several days when he takes a deep breath in only. This pain radiates to R shoulder. No constant pain. He also reports gradually worsening SOB with exertion. No leg swelling. No fever. He has had nasal congestion. No N/V/D. The onset of this condition was gradual. The course is gradually worsening. Patient states he had 'cyst' on lung noted on CT scans earlier this year. These were reviewed and lung bases were clear. 0.9cm R renal cyst noted. Aggravating factors: none. Alleviating factors: none.    Patient is a 42 y.o. male presenting with shortness of breath and chest pain. The history is provided by the patient.  Shortness of Breath Associated symptoms: chest pain and cough   Associated symptoms: no abdominal pain, no fever, no headaches, no rash, no sore throat and no vomiting   Chest Pain Associated symptoms: cough and shortness of breath   Associated symptoms: no abdominal pain, no fever, no headache, no nausea and not vomiting     Past Medical History  Diagnosis Date  . Coronary artery disease   . Hypertension   . Hypercholesteremia   . Kidney stone   . Tobacco use disorder   . Hx of CABG    Past Surgical History  Procedure Laterality Date  . Coronary artery bypass graft    . Kidney stone surgery     No family history on file. History  Substance Use Topics  . Smoking status: Current Every Day  Smoker -- 1.00 packs/day for 31 years  . Smokeless tobacco: Not on file  . Alcohol Use: Yes     Comment: 12pk/day on weekends    Review of Systems  Constitutional: Negative for fever.  HENT: Positive for congestion. Negative for rhinorrhea and sore throat.   Eyes: Negative for redness.  Respiratory: Positive for cough and shortness of breath.   Cardiovascular: Positive for chest pain. Negative for leg swelling.  Gastrointestinal: Negative for nausea, vomiting, abdominal pain and diarrhea.  Genitourinary: Negative for dysuria.  Musculoskeletal: Negative for myalgias.  Skin: Negative for rash.  Neurological: Negative for headaches.    Allergies  Review of patient's allergies indicates no known allergies.  Home Medications   Current Outpatient Rx  Name  Route  Sig  Dispense  Refill  . ibuprofen (ADVIL,MOTRIN) 200 MG tablet   Oral   Take 400 mg by mouth every 6 (six) hours as needed.         . nitroGLYCERIN (NITROSTAT) 0.4 MG SL tablet   Sublingual   Place 1 tablet (0.4 mg total) under the tongue every 5 (five) minutes as needed for chest pain.   15 tablet   0    BP 135/74  Pulse 88  Temp(Src) 98.4 F (36.9 C) (Oral)  Resp 14  SpO2 99% Physical Exam  Nursing note and vitals reviewed. Constitutional: He appears well-developed and well-nourished.  HENT:  Head: Normocephalic and atraumatic.  Mouth/Throat: Oropharynx is clear and moist  and mucous membranes are normal. Mucous membranes are not dry.  Eyes: Conjunctivae are normal.  Neck: Trachea normal and normal range of motion. Neck supple. Normal carotid pulses and no JVD present. No muscular tenderness present. Carotid bruit is not present. No tracheal deviation present.  Cardiovascular: Normal rate, regular rhythm, S1 normal, S2 normal, normal heart sounds and intact distal pulses.  Exam reveals no distant heart sounds and no decreased pulses.   No murmur heard. Pulmonary/Chest: Effort normal. No respiratory  distress. He has wheezes (expiratory, mild, scattered). He has no rales. He exhibits no tenderness.  Abdominal: Soft. Normal aorta and bowel sounds are normal. There is no tenderness. There is no rebound and no guarding.  Musculoskeletal: He exhibits no edema.  Lymphadenopathy:    He has no cervical adenopathy.  Neurological: He is alert.  Skin: Skin is warm and dry. He is not diaphoretic. No cyanosis. No pallor.  Psychiatric: He has a normal mood and affect.    ED Course  Procedures (including critical care time) Labs Review Labs Reviewed  CBC - Abnormal; Notable for the following:    RBC 4.07 (*)    MCH 34.4 (*)    All other components within normal limits  BASIC METABOLIC PANEL - Abnormal; Notable for the following:    Potassium 3.4 (*)    Glucose, Bld 157 (*)    GFR calc non Af Amer 77 (*)    GFR calc Af Amer 89 (*)    All other components within normal limits  PRO B NATRIURETIC PEPTIDE - Abnormal; Notable for the following:    Pro B Natriuretic peptide (BNP) 216.9 (*)    All other components within normal limits  INFLUENZA PANEL BY PCR  POCT I-STAT TROPONIN I   Imaging Review Dg Chest 2 View  04/27/2013   CLINICAL DATA:  Shortness of breath, cough  EXAM: CHEST  2 VIEW  COMPARISON:  03/21/2013  FINDINGS: The cardiac silhouette is in normal limits. Patient is status post median sternotomy. There is no evidence of focal infiltrates, effusions, or edema. The osseous structures are unremarkable.  IMPRESSION: No active cardiopulmonary disease.   Electronically Signed   By: Salome Holmes M.D.   On: 04/27/2013 15:55    EKG Interpretation    Date/Time:  Sunday April 27 2013 14:22:45 EST Ventricular Rate:  90 PR Interval:  159 QRS Duration: 96 QT Interval:  359 QTC Calculation: 439 R Axis:   30 Text Interpretation:  Sinus rhythm Probable left atrial enlargement RSR' in V1 or V2, right VCD or RVH Nonspecific T abnormalities, lateral leads No significant change was found  Confirmed by [facility]  MD, TREY ([id number]) on 04/27/2013 4:09:43 PM           3:50 PM Patient seen and examined. Previous medical records reviewed. Work-up initiated. Medications ordered. EKG reviewed. No changes. CXR reviewed by myself.   Vital signs reviewed and are as follows: Filed Vitals:   04/27/13 1427  BP: 135/74  Pulse: 88  Temp: 98.4 F (36.9 C)  Resp: 14   Patient well but desaturates to 85% on RA with ambulation.   Patient discussed with and seen by Dr. Loretha Stapler.   Will admit. Patient initially requests discharge but agrees to stay after discussing results. Spoke with Dr. Mahala Menghini who will see patient in ED. Influenza swab and weight ordered.   MDM   1. Hypoxia   2. Bronchospasm with bronchitis, acute    Patient with SOB, worsening, desat with ambulation. No  signs of PNA. Does not appear fluid overloaded on exam or CXR. Will treat as bronchitis/COPD. Will need admission for evaluation given complex history and hypoxia with exertion.     Renne Crigler, PA-C 04/27/13 [phone]

## 2013-04-27 NOTE — ED Provider Notes (Signed)
Medical screening examination/treatment/procedure(s) were conducted as a shared visit with non-physician practitioner(s) and myself.  I personally evaluated the patient during the encounter.  EKG Interpretation    Date/Time:  "Sunday April 27 2013 14:22:45 EST Ventricular Rate:  90 PR Interval:  159 QRS Duration: 96 QT Interval:  359 QTC Calculation: 439 R Axis:   30 Text Interpretation:  Sinus rhythm Probable left atrial enlargement RSR' in V1 or V2, right VCD or RVH Nonspecific T abnormalities, lateral leads No significant change was found Confirmed by Menelik Mcfarren  MD, TREY ([id number]) on 04/27/2013 4:09:43 PM            42"  yo male with hx of CABG presenting with shortness of breath with exertion and cough with white sputum for 1 month.  Now has green colored sputum and worsening cough.  On exam, well appearing, no distress, normal respiratory effort, mild wheezing bilaterally, worse on right, no rales, no peripheral edema, normal heart sounds.  He was well appearing at rest, but desaturated to mid 80's on ambulation.  Will be admitted for further management.   Clinical Impression: 1. Hypoxia   2.  Cough    Candyce Churn, MD 04/27/13 1919

## 2013-04-27 NOTE — H&P (Signed)
[facility] History and Physical  Steven [name] ZOX:[id number] DOB: 02/08/1971 DOA: 04/27/2013  Referring physician: Deirdre Pippins PCP: Kaleen Mask, MD  Specialists: None  Chief Complaint: Shortness of breath x1 month  HPI: Steven [name] is a 42 y.o. 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 one month he he states that over the past month he's been smoking 2-5 cigarettes daily at his Alcoholics Anonymous meeting and has been more short of breath than usual. This progressed and he noticed a change over the past week where he started having whitish sputum and then this turned to a greenish type sputum. No fevers no chills no nausea no vomiting no chest pain no blurred or double vision No ill contacts No outside food no diarrhea He denies specifically any chest pain, but does feel tightness chest He ambulated in the emergency room and his sats dropped to 85 sent and then receive some nebs and felt better. He ambulated once again and still had 85th percentile.  He has a long history of drinking and drinks usually about a 12 pack every 3 or 4 days" September. 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     Review of Systems: Negative except as above  Past Medical History  Diagnosis Date  . Coronary artery disease   . Hypertension   . Hypercholesteremia   . Kidney stone   . Tobacco use disorder    . Hx of CABG    Past Surgical History  Procedure Laterality Date  . Coronary artery bypass graft    . Kidney stone surgery     Social History:  History   Social History Narrative  . No narrative on file    No Known Allergies  No family history on file.  Noncontributory, no cardiac history noted, no hypertension diabetes  Prior to Admission medications   Medication Sig Start Date End Date Taking? Authorizing Provider  ibuprofen (ADVIL,MOTRIN) 200 MG tablet Take 400 mg by mouth every 6 (six) hours as needed.   Yes Historical Provider, MD  nitroGLYCERIN (NITROSTAT) 0.4 MG SL tablet Place 1 tablet (0.4 mg total) under the tongue every 5 (five) minutes as needed for chest pain. 08/15/12   Penny Pia, MD   Physical Exam: Filed Vitals:   04/27/13 1427 04/27/13 1634 04/27/13 1832 04/27/13 1836  BP: 135/74   145/78  Pulse: 88   61  Temp: 98.4 F (36.9 C)   98 F (36.7 C)  TempSrc: Oral   Oral  Resp: 14   16  Weight:   88.225 kg (194 lb 8 oz)   SpO2: 99% 85%  95%     General:  EOMI, NCAT  Eyes: Clinically clear  ENT: Soft supple  Neck: No JVD no bruit   Cardiovascular: S1-S2 no murmur rub or gallop no S3   Respiratory: Clinically clear  Abdomen: Soft nontender nondistended  Skin: No lower extremity edema  Musculoskeletal: Range of motion intact  Psychiatric: Euthymic  Neurologic: Grossly intact  Labs on Admission:  Basic Metabolic Panel:  Recent Labs Lab 04/27/13 1430  NA 141  K 3.4*  CL 102  CO2 26  GLUCOSE 157*  BUN 9  CREATININE 1.14  CALCIUM 9.5   Liver Function Tests: No results found for this basename: AST, ALT, ALKPHOS, BILITOT, PROT, ALBUMIN,  in the last 168 hours No results found for this basename: LIPASE, AMYLASE,  in the last 168 hours No results found for this basename: AMMONIA,  in the last 168 hours CBC:  Recent Labs Lab 04/27/13 1430  WBC 7.5  HGB 14.0  HCT 39.8  MCV 97.8  PLT 316   Cardiac Enzymes: No results found  for this basename: CKTOTAL, CKMB, CKMBINDEX, TROPONINI,  in the last 168 hours  BNP (last 3 results)  Recent Labs  04/27/13 1430  PROBNP 216.9*   CBG: No results found for this basename: GLUCAP,  in the last 168 hours  Radiological Exams on Admission: Dg Chest 2 View  04/27/2013   CLINICAL DATA:  Shortness of breath, cough  EXAM: CHEST  2 VIEW  COMPARISON:  03/21/2013  FINDINGS: The cardiac silhouette is in normal limits. Patient is status post median sternotomy. There is no evidence of focal infiltrates, effusions, or edema. The osseous structures are unremarkable.  IMPRESSION: No active cardiopulmonary disease.   Electronically Signed   By: Salome Holmes M.D.   On: 04/27/2013 15:55    EKG: Independently reviewed. Left atrial enlargement RSR prime no ST-T wave changes   Assessment/Plan Principal Problem:   Respiratory failure, acute-likely multifactorial, potentially COPD vs. allergic bronchitis/asthmatic bronchitis-influenza pending, will treat with nebulizations every 4 when necessary, potentially add Advair in the morning, depending on the test results add Tamiflu, add azithromycin/Bactrim for decreasing burden of COPD symptoms. He needs his smoking and has been counseled regarding this-continue prednisone 60 daily with a rapid taper 5 days, (evidence suggest five-day therapy as effective as 14 day) Active Problems:   DYSLIPIDEMIA-ideally should be on a statin does not have PCP, get lipid panel a.m.   SUBSTANCE ABUSE-prior history earlier this year. Will question in more detail in the morning.   HYPERTENSION-none medications, ideally should be on ACE inhibitor/beta blocker given CABG history   Alcohol abuse, continuous-claims to have quit   Claudication of left lower extremity-stable   Tobacco use disorder-cessation counseling ordered, we'll offer patch   CAD status post CABG-start aspirin Plavix, ACE inhibitor, beta blocker   Time spent: 40   Mahala Menghini [facility] Pager [phone]   If 7PM-7AM, please contact night-coverage [email or web address] Password [facility] 04/27/2013, 6:40 PM

## 2013-04-28 ENCOUNTER — Encounter: Payer: Self-pay | Admitting: Family Medicine

## 2013-04-28 ENCOUNTER — Encounter (HOSPITAL_COMMUNITY): Payer: Self-pay

## 2013-04-28 DIAGNOSIS — R0902 Hypoxemia: Secondary | ICD-10-CM

## 2013-04-28 LAB — CBC
HCT: 39.2 % (ref 39.0–52.0)
MCHC: 34.2 g/dL (ref 30.0–36.0)
MCV: 98.5 fL (ref 78.0–100.0)
Platelets: 265 10*3/uL (ref 150–400)
RBC: 3.98 MIL/uL — ABNORMAL LOW (ref 4.22–5.81)
RDW: 13.4 % (ref 11.5–15.5)
WBC: 9.7 10*3/uL (ref 4.0–10.5)

## 2013-04-28 LAB — LIPID PANEL
HDL: 43 mg/dL (ref >39)
LDL Cholesterol: 120 mg/dL — ABNORMAL HIGH (ref 0–99)
Triglycerides: 190 mg/dL — ABNORMAL HIGH (ref <150)
VLDL: 38 mg/dL (ref 0–40)

## 2013-04-28 LAB — COMPREHENSIVE METABOLIC PANEL
AST: 15 U/L (ref 0–37)
Alkaline Phosphatase: 81 U/L (ref 39–117)
BUN: 13 mg/dL (ref 6–23)
Chloride: 99 mEq/L (ref 96–112)
Creatinine, Ser: 0.96 mg/dL (ref 0.50–1.35)
GFR calc Af Amer: >90 mL/min (ref >90)
Glucose, Bld: 213 mg/dL — ABNORMAL HIGH (ref 70–99)
Potassium: 3.4 mEq/L — ABNORMAL LOW (ref 3.5–5.1)
Total Bilirubin: 0.2 mg/dL — ABNORMAL LOW (ref 0.3–1.2)
Total Protein: 6.9 g/dL (ref 6.0–8.3)

## 2013-04-28 MED ORDER — SIMVASTATIN 40 MG PO TABS
40.0000 mg | ORAL_TABLET | Freq: Every day | ORAL | Status: DC
  Filled 2013-04-28: qty 1

## 2013-04-28 MED ORDER — NICOTINE POLACRILEX 2 MG MT GUM
2.0000 mg | CHEWING_GUM | OROMUCOSAL | Status: DC | PRN
  Filled 2013-04-28: qty 1

## 2013-04-28 MED ORDER — CARVEDILOL 6.25 MG PO TABS: 6.2500 mg | ORAL_TABLET | Freq: Two times a day (BID) | ORAL | Status: DC

## 2013-04-28 MED ORDER — PREDNISONE 50 MG PO TABS: ORAL_TABLET | ORAL | Status: DC

## 2013-04-28 MED ORDER — NICOTINE POLACRILEX 2 MG MT GUM: 2.0000 mg | CHEWING_GUM | OROMUCOSAL | Status: DC | PRN

## 2013-04-28 MED ORDER — IPRATROPIUM-ALBUTEROL 20-100 MCG/ACT IN AERS
1.0000 | INHALATION_SPRAY | Freq: Four times a day (QID) | RESPIRATORY_TRACT | Status: DC
  Filled 2013-04-28: qty 4

## 2013-04-28 MED ORDER — ASPIRIN 81 MG PO CHEW: 81.0000 mg | CHEWABLE_TABLET | Freq: Every day | ORAL | Status: DC

## 2013-04-28 MED ORDER — SULFAMETHOXAZOLE-TMP DS 800-160 MG PO TABS: 1.0000 | ORAL_TABLET | Freq: Two times a day (BID) | ORAL | Status: DC

## 2013-04-28 MED ORDER — SIMVASTATIN 40 MG PO TABS: 40.0000 mg | ORAL_TABLET | Freq: Every day | ORAL | Status: DC

## 2013-04-28 MED ORDER — ALBUTEROL SULFATE HFA 108 (90 BASE) MCG/ACT IN AERS: 2.0000 | INHALATION_SPRAY | Freq: Four times a day (QID) | RESPIRATORY_TRACT | Status: DC | PRN

## 2013-04-28 NOTE — Discharge Summary (Signed)
Physician Discharge Summary  Steven [name] ZOX:[id number] DOB: 1970-10-19 DOA: 04/27/2013  PCP: Kaleen Mask, MD  Admit date: 04/27/2013 Discharge date: 04/28/2013  Time spent: 30 minutes  Recommendations for Outpatient Follow-up:  1. Patient recommended to follow medical advice and be compliant on medical therapy 2. Patient recommended to find a primary care physician 3. Patient recommended that case management to obtain insurance 4. Patient recommended to quit cocaine use 5. Patient recommended followup with psychiatrist 6. Patient recommended to stop smoking   Discharge Diagnoses:  Principal Problem:   Respiratory failure, acute Active Problems:   DYSLIPIDEMIA   SUBSTANCE ABUSE   HYPERTENSION   Alcohol abuse, continuous   Claudication of left lower extremity   Tobacco use disorder   Hypoxia   Acute respiratory failure   Discharge Condition: Stable  Diet recommendation: Heart healthy  Filed Weights   04/27/13 1832 04/27/13 1917  Weight: 88.225 kg (194 lb 8 oz) 87.998 kg (194 lb)    History of present illness:  Steven [name] is a 42 y.o. 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 one month he he states that over the past month he's been smoking 2-5 cigarettes daily at his Alcoholics Anonymous meeting and has been more short of breath than usual. This progressed and he noticed a change over the past week where he started having whitish sputum and then this turned to a greenish type sputum.   Hospital Course:   Principal Problem:  Respiratory failure,  acute-likely multifactorial, potentially COPD vs. allergic bronchitis/asthmatic bronchitis-influenza pending, initially nebulizations every 4 when necessary--- added azithromycin/Bactrim for decreasing burden of COPD symptoms. He needs his smoking and has been counseled regarding this-continue prednisone 60 daily with a rapid taper 5 days, (evidence suggest five-day therapy as effective as 14 day)  Active Problems:  DYSLIPIDEMIA-LDL 120 ideally should be on a statin does not have PCP-prescribed Zocor 40 SUBSTANCE ABUSE-prior history earlier this year. Recommended cessation HYPERTENSION-none medications, ideally should be on ACE inhibitor/beta blocker-decision made to start Coreg 6 point 25 twice a day Alcohol abuse, continuous-claims to have quit  Claudication of left lower extremity-stable  Tobacco use disorder-cessation counseling ordered, we'll offer nicotine CAD status post CABG-start aspirin, ACE inhibitor, beta blocker   Procedures:  Chest x-ray   Consultations:  None  Discharge Exam: Filed Vitals:   04/28/13 0928  BP:   Pulse: 84  Temp:   Resp: 18   Alert pleasant stable ambulatory does not have an oxygen requirement.  General: EOMI, NCAT-ambulatory without oxygen requirement Cardiovascular: S1-S2 no murmur rub or gallop Respiratory: Clear  Discharge Instructions  Discharge Orders   Future Orders Complete By Expires   (HEART FAILURE PATIENTS) Call MD:  Anytime you have any of the following symptoms: 1) 3 pound weight gain in 24 hours or 5 pounds in 1 week 2) shortness of breath, with or without a dry hacking cough 3) swelling in the hands, feet or stomach 4) if you have to sleep on extra pillows at night in order to breathe.  As directed    Call MD for:  difficulty breathing, headache or visual disturbances  As directed    Call  MD for:  persistant dizziness or light-headedness  As directed    Call MD for:  persistant nausea and vomiting  As directed    Call MD for:   redness, tenderness, or signs of infection (pain, swelling, redness, odor or green/yellow discharge around incision site)  As directed    Diet - low sodium heart healthy  As directed    Increase activity slowly  As directed        Medication List    STOP taking these medications       ibuprofen 200 MG tablet  Commonly known as:  ADVIL,MOTRIN     nitroGLYCERIN 0.4 MG SL tablet  Commonly known as:  NITROSTAT      TAKE these medications       aspirin 81 MG chewable tablet  Chew 1 tablet (81 mg total) by mouth daily.     nicotine polacrilex 2 MG gum  Commonly known as:  NICORETTE  Take 1 each (2 mg total) by mouth as needed for smoking cessation.     simvastatin 40 MG tablet  Commonly known as:  ZOCOR  Take 1 tablet (40 mg total) by mouth daily at 6 PM.     sulfamethoxazole-trimethoprim 800-160 MG per tablet  Commonly known as:  BACTRIM DS  Take 1 tablet by mouth every 12 (twelve) hours.       No Known Allergies    The results of significant diagnostics from this hospitalization (including imaging, microbiology, ancillary and laboratory) are listed below for reference.    Significant Diagnostic Studies: Dg Chest 2 View  04/27/2013   CLINICAL DATA:  Shortness of breath, cough  EXAM: CHEST  2 VIEW  COMPARISON:  03/21/2013  FINDINGS: The cardiac silhouette is in normal limits. Patient is status post median sternotomy. There is no evidence of focal infiltrates, effusions, or edema. The osseous structures are unremarkable.  IMPRESSION: No active cardiopulmonary disease.   Electronically Signed   By: Salome Holmes M.D.   On: 04/27/2013 15:55    Microbiology: No results found for this or any previous visit (from the past 240 hour(s)).   Labs: Basic Metabolic Panel:  Recent Labs Lab 04/27/13 1430 04/28/13 0427  NA 141 138  K 3.4* 3.4*  CL 102 99  CO2 26 23  GLUCOSE 157* 213*  BUN 9 13  CREATININE 1.14 0.96  CALCIUM 9.5 9.7   Liver Function Tests:  Recent  Labs Lab 04/28/13 0427  AST 15  ALT 15  ALKPHOS 81  BILITOT 0.2*  PROT 6.9  ALBUMIN 3.5   No results found for this basename: LIPASE, AMYLASE,  in the last 168 hours No results found for this basename: AMMONIA,  in the last 168 hours CBC:  Recent Labs Lab 04/27/13 1430 04/28/13 0427  WBC 7.5 9.7  HGB 14.0 13.4  HCT 39.8 39.2  MCV 97.8 98.5  PLT 316 265   Cardiac Enzymes: No results found for this basename: CKTOTAL, CKMB, CKMBINDEX, TROPONINI,  in the last 168 hours BNP: BNP (last 3 results)  Recent Labs  04/27/13 1430  PROBNP 216.9*   CBG: No results found for this basename: GLUCAP,  in the last 168 hours     Signed:  Rhetta Mura  [facility] 04/28/2013, 10:18 AM

## 2013-04-28 NOTE — Progress Notes (Signed)
Patient discharged home, discharge instructions given and explained to patient and he verbalized understanding denies any pain/distress. No wound noted, skin intact.

## 2013-04-28 NOTE — Progress Notes (Signed)
Spoke with pt concerning medications,PCP and [employer].  Pt states that he can purchase his medications, he will make an appointment with his PCP Dr. Jeannetta Nap.  Encouraged pt to sign up for affordable [employer]. Pt use [facility] or [facility] on [address].

## 2013-04-28 NOTE — Progress Notes (Signed)
CSW received consult for COPD Gold Protocol, though patient had only been admitted to the hospital once in 6 months & ED once in 6 months - therefor, inappropriate consult as patient does not meet criteria for COPD Gold Protocol.   Unice Bailey, LCSW [facility] Clinical Social Worker cell #: [phone]

## 2013-04-29 LAB — RESPIRATORY VIRUS PANEL
Adenovirus: NOT DETECTED
Influenza A H3: NOT DETECTED
Parainfluenza 2: NOT DETECTED
Respiratory Syncytial Virus A: NOT DETECTED
Respiratory Syncytial Virus B: NOT DETECTED
Rhinovirus: NOT DETECTED

## 2013-05-12 ENCOUNTER — Encounter (HOSPITAL_COMMUNITY): Payer: Self-pay | Admitting: Emergency Medicine

## 2013-05-12 ENCOUNTER — Emergency Department (HOSPITAL_COMMUNITY): Payer: Self-pay

## 2013-05-12 ENCOUNTER — Emergency Department (HOSPITAL_COMMUNITY)
Admission: EM | Admit: 2013-05-12 | Discharge: 2013-05-12 | Disposition: A | Discharge status: Home / Self Care | Payer: Self-pay | Attending: Emergency Medicine | Admitting: Emergency Medicine

## 2013-05-12 DIAGNOSIS — Z8639 Personal history of other endocrine, nutritional and metabolic disease: Secondary | ICD-10-CM | POA: Insufficient documentation

## 2013-05-12 DIAGNOSIS — J449 Chronic obstructive pulmonary disease, unspecified: Secondary | ICD-10-CM

## 2013-05-12 DIAGNOSIS — Z87442 Personal history of urinary calculi: Secondary | ICD-10-CM | POA: Insufficient documentation

## 2013-05-12 DIAGNOSIS — IMO0002 Reserved for concepts with insufficient information to code with codable children: Secondary | ICD-10-CM | POA: Insufficient documentation

## 2013-05-12 DIAGNOSIS — I10 Essential (primary) hypertension: Secondary | ICD-10-CM | POA: Insufficient documentation

## 2013-05-12 DIAGNOSIS — J441 Chronic obstructive pulmonary disease with (acute) exacerbation: Secondary | ICD-10-CM | POA: Insufficient documentation

## 2013-05-12 DIAGNOSIS — I2510 Atherosclerotic heart disease of native coronary artery without angina pectoris: Secondary | ICD-10-CM | POA: Insufficient documentation

## 2013-05-12 DIAGNOSIS — Z951 Presence of aortocoronary bypass graft: Secondary | ICD-10-CM | POA: Insufficient documentation

## 2013-05-12 DIAGNOSIS — J069 Acute upper respiratory infection, unspecified: Secondary | ICD-10-CM | POA: Insufficient documentation

## 2013-05-12 DIAGNOSIS — Z79899 Other long term (current) drug therapy: Secondary | ICD-10-CM | POA: Insufficient documentation

## 2013-05-12 DIAGNOSIS — Z862 Personal history of diseases of the blood and blood-forming organs and certain disorders involving the immune mechanism: Secondary | ICD-10-CM | POA: Insufficient documentation

## 2013-05-12 DIAGNOSIS — F17200 Nicotine dependence, unspecified, uncomplicated: Secondary | ICD-10-CM | POA: Insufficient documentation

## 2013-05-12 LAB — CBC WITH DIFFERENTIAL/PLATELET
Basophils Absolute: 0.1 10*3/uL (ref 0.0–0.1)
Eosinophils Absolute: 0.4 10*3/uL (ref 0.0–0.7)
Eosinophils Relative: 4 % (ref 0–5)
Lymphocytes Relative: 21 % (ref 12–46)
MCH: 34.4 pg — ABNORMAL HIGH (ref 26.0–34.0)
MCV: 98.2 fL (ref 78.0–100.0)
Neutro Abs: 7.3 10*3/uL (ref 1.7–7.7)
Platelets: 274 10*3/uL (ref 150–400)
RDW: 12.7 % (ref 11.5–15.5)
WBC: 11 10*3/uL — ABNORMAL HIGH (ref 4.0–10.5)

## 2013-05-12 LAB — BASIC METABOLIC PANEL
CO2: 24 mEq/L (ref 19–32)
Calcium: 9.2 mg/dL (ref 8.4–10.5)
Chloride: 99 mEq/L (ref 96–112)
GFR calc Af Amer: >90 mL/min (ref >90)
GFR calc non Af Amer: 88 mL/min — ABNORMAL LOW (ref >90)
Sodium: 137 mEq/L (ref 135–145)

## 2013-05-12 MED ORDER — ALBUTEROL SULFATE HFA 108 (90 BASE) MCG/ACT IN AERS
2.0000 | INHALATION_SPRAY | RESPIRATORY_TRACT | Status: DC | PRN
  Administered 2013-05-12: 2 via RESPIRATORY_TRACT
  Filled 2013-05-12: qty 6.7

## 2013-05-12 MED ORDER — MORPHINE SULFATE 4 MG/ML IJ SOLN: 4.0000 mg | Freq: Once | INTRAMUSCULAR | Status: DC

## 2013-05-12 MED ORDER — SODIUM CHLORIDE 0.9 % IV BOLUS (SEPSIS)
1000.0000 mL | Freq: Once | INTRAVENOUS | Status: AC
  Administered 2013-05-12: 1000 mL via INTRAVENOUS

## 2013-05-12 MED ORDER — METHYLPREDNISOLONE SODIUM SUCC 125 MG IJ SOLR
125.0000 mg | Freq: Once | INTRAMUSCULAR | Status: AC
  Administered 2013-05-12: 125 mg via INTRAVENOUS
  Filled 2013-05-12: qty 2

## 2013-05-12 MED ORDER — SODIUM CHLORIDE 0.9 % IV BOLUS (SEPSIS): 1000.0000 mL | Freq: Once | INTRAVENOUS | Status: DC

## 2013-05-12 MED ORDER — IBUPROFEN 800 MG PO TABS
800.0000 mg | ORAL_TABLET | Freq: Once | ORAL | Status: AC
  Administered 2013-05-12: 800 mg via ORAL
  Filled 2013-05-12: qty 1

## 2013-05-12 MED ORDER — ALBUTEROL SULFATE (5 MG/ML) 0.5% IN NEBU
5.0000 mg | INHALATION_SOLUTION | Freq: Once | RESPIRATORY_TRACT | Status: AC
  Administered 2013-05-12: 5 mg via RESPIRATORY_TRACT
  Filled 2013-05-12: qty 1

## 2013-05-12 MED ORDER — ONDANSETRON HCL 4 MG/2ML IJ SOLN
4.0000 mg | Freq: Once | INTRAMUSCULAR | Status: AC
  Administered 2013-05-12: 4 mg via INTRAVENOUS
  Filled 2013-05-12: qty 2

## 2013-05-12 MED ORDER — PREDNISONE 10 MG PO TABS: 20.0000 mg | ORAL_TABLET | Freq: Every day | ORAL | Status: DC

## 2013-05-12 MED ORDER — LEVOFLOXACIN 500 MG PO TABS: 500.0000 mg | ORAL_TABLET | Freq: Every day | ORAL | Status: DC

## 2013-05-12 MED ORDER — IPRATROPIUM BROMIDE 0.02 % IN SOLN
0.5000 mg | Freq: Once | RESPIRATORY_TRACT | Status: AC
  Administered 2013-05-12: 0.5 mg via RESPIRATORY_TRACT
  Filled 2013-05-12: qty 2.5

## 2013-05-12 NOTE — ED Notes (Addendum)
Pt states that he has not been taking any of his prescribed medicine because he cannot afford it. Has not taken any coumadin, bp medicine since leaving hospital. States has not taken medicine since May 2014 after stent in left leg placed.

## 2013-05-12 NOTE — ED Notes (Signed)
To ED via private vehicle from home -- was admitted to [facility] with bronchitis d/c'd on 12/15. States was feeling better until after Christmas, then started coughing up green mucus. Also c/o shortness of breathe with any exertion. Fever last night (sweating and chills).

## 2013-05-12 NOTE — ED Notes (Signed)
Ambulated pt near nurses station, pt states some SOB. Pulse ox on room 96-98%, HR 96-100. Pt denies dizziness.

## 2013-05-12 NOTE — ED Provider Notes (Signed)
CSN: [id number]     Arrival date & time 05/12/13  7829 History   First MD Initiated Contact with Patient 05/12/13 0902     Chief Complaint  Patient presents with  . URI  . flu like symptoms    (Consider location/radiation/quality/duration/timing/severity/associated sxs/prior Treatment) HPI  Pt presents to the ED with complaints of flu-like symptoms of cough, congestion,, muscle aches, chills, headaches. He was admitted at [facility] ED with bronchitis on 12/15 and had been feeling much better until around christmas time when the symptoms returned.   Pt has been around other sick contacts and did not get the flu shot this year. The patient denies headaches, neck pain, weakness, vision changes,  fevers, ear pain,severe abdominal pain,abdominal pain, vomiting, diarrhea sore throat inability to eat or drink, difficulty breathing, SOB, wheezing, chest pain. The patient has tried cough medicine, NSAIDS, and rest but has only felt mild relief.   He also reports feeling SOB and is a smoker.    Past Medical History  Diagnosis Date  . Coronary artery disease   . Hypertension   . Hypercholesteremia   . Kidney stone   . Tobacco use disorder   . Hx of CABG    Past Surgical History  Procedure Laterality Date  . Coronary artery bypass graft    . Kidney stone surgery     History reviewed. No pertinent family history. History  Substance Use Topics  . Smoking status: Current Every Day Smoker -- 1.00 packs/day for 31 years  . Smokeless tobacco: Not on file  . Alcohol Use: Yes     Comment: 12pk/day on weekends    Review of Systems The patient denies anorexia, fever, weight loss,, vision loss, decreased hearing, hoarseness, chest pain, syncope, peripheral edema, balance deficits, hemoptysis, abdominal pain, melena, hematochezia, severe indigestion/heartburn, hematuria, incontinence, genital sores, muscle weakness, suspicious skin lesions, transient blindness, difficulty walking, depression, unusual  weight change, abnormal bleeding, enlarged lymph nodes, angioedema, and breast masses.  Allergies  Review of patient's allergies indicates no known allergies.  Home Medications   Current Outpatient Rx  Name  Route  Sig  Dispense  Refill  . ibuprofen (ADVIL,MOTRIN) 200 MG tablet   Oral   Take 400 mg by mouth every 6 (six) hours as needed for mild pain.         Marland Kitchen levofloxacin (LEVAQUIN) 500 MG tablet   Oral   Take 1 tablet (500 mg total) by mouth daily.   5 tablet   0   . predniSONE (DELTASONE) 10 MG tablet   Oral   Take 2 tablets (20 mg total) by mouth daily.   21 tablet   0     Prednisone dose pack directions:   6 tabs on day ...    BP 130/68  Pulse 88  Temp(Src) 98.3 F (36.8 C) (Oral)  Resp 22  Wt 211 lb 3.2 oz (95.8 kg)  SpO2 100% Physical Exam  Nursing note and vitals reviewed. Constitutional: He is oriented to person, place, and time. He appears well-developed and well-nourished. No distress.  HENT:  Head: Normocephalic and atraumatic.  Right Ear: External ear normal.  Left Ear: External ear normal.  Nose: Rhinorrhea present. Right sinus exhibits maxillary sinus tenderness and frontal sinus tenderness. Left sinus exhibits maxillary sinus tenderness and frontal sinus tenderness.  Eyes: Pupils are equal, round, and reactive to light.  Neck: Normal range of motion. Neck supple.  Cardiovascular: Normal rate and regular rhythm.   Pulmonary/Chest: Effort normal. No respiratory distress.  He has wheezes. He has no rales.  Abdominal: Soft. He exhibits no distension. There is no tenderness. There is no rebound.  Neurological: He is alert and oriented to person, place, and time. No cranial nerve deficit or sensory deficit.  Skin: Skin is warm and dry.    ED Course  Procedures (including critical care time) Labs Review Labs Reviewed  CBC WITH DIFFERENTIAL - Abnormal; Notable for the following:    WBC 11.0 (*)    MCH 34.4 (*)    All other components within  normal limits  BASIC METABOLIC PANEL - Abnormal; Notable for the following:    Potassium 3.4 (*)    GFR calc non Af Amer 88 (*)    All other components within normal limits   Imaging Review Dg Chest 2 View  05/12/2013   CLINICAL DATA:  42 year old male with productive cough shortness of breath and fever. Initial encounter.  EXAM: CHEST  2 VIEW  COMPARISON:  04/27/2013 and earlier.  FINDINGS: Stable lung volumes. Stable sequelae of CABG. Normal cardiac size and mediastinal contours. No pneumothorax, pulmonary edema, pleural effusion or acute pulmonary opacity. Stable increased interstitial markings with basilar predominance. No acute osseous abnormality identified.  IMPRESSION: No acute cardiopulmonary abnormality.   Electronically Signed   By: Augusto Gamble M.D.   On: 05/12/2013 10:12    EKG Interpretation    Date/Time:  Monday May 12 2013 08:53:58 EST Ventricular Rate:  104 PR Interval:  150 QRS Duration: 94 QT Interval:  346 QTC Calculation: 454 R Axis:   47 Text Interpretation:  Sinus tachycardia Possible Left atrial enlargement Possible Inferior infarct , age undetermined Abnormal ECG No significant change since No significant change since last tracing Confirmed by ZACKOWSKI  MD, SCOTT ([id number]) on 05/12/2013 9:20:50 AM            MDM   1. URI (upper respiratory infection)   2. COPD (chronic obstructive pulmonary disease)       Patient with SOB, worsening,with ambulation but NO desat with ambulation. No signs of PNA. Does not appear fluid overloaded on exam or CXR. Will treat as bronchitis/COPD.   Ambulated pt near nurses station, pt states some SOB. Pulse ox on room 96-98%, HR 96-100. Pt denies dizziness. Hollace Kinnier, EMT   Patients lab work is reassuring. Pt can not remember if he got abx in the hospital and I can not find in the records that he did receive abx.  This is most likely URI/COPD mix. Will Rx Levaquin and prednisone taper. Given Albuterol HFA in the  ED.  42 y.o.Jams L Weisensel's evaluation in the Emergency Department is complete. It has been determined that no acute conditions requiring further emergency intervention are present at this time. The patient/guardian have been advised of the diagnosis and plan. We have discussed signs and symptoms that warrant return to the ED, such as changes or worsening in symptoms.  Vital signs are stable at discharge. Filed Vitals:   05/12/13 1039  BP: 130/68  Pulse: 88  Temp: 98.3 F (36.8 C)  Resp: 22    Patient/guardian has voiced understanding and agreed to follow-up with the PCP or specialist.     Dorthula Matas, PA-C 05/12/13 1153

## 2013-05-13 NOTE — ED Provider Notes (Signed)
Medical screening examination/treatment/procedure(s) were performed by non-physician practitioner and as supervising physician I was immediately available for consultation/collaboration.  EKG Interpretation    Date/Time:  Monday May 12 2013 08:53:58 EST Ventricular Rate:  104 PR Interval:  150 QRS Duration: 94 QT Interval:  346 QTC Calculation: 454 R Axis:   47 Text Interpretation:  Sinus tachycardia Possible Left atrial enlargement Possible Inferior infarct , age undetermined Abnormal ECG No significant change since No significant change since last tracing Confirmed by Emiya Loomer  MD, Anjel Perfetti ([id number]) on 05/12/2013 9:20:50 AM              Shelda Jakes, MD 05/13/13 [phone]

## 2013-06-27 ENCOUNTER — Encounter (HOSPITAL_COMMUNITY): Payer: Self-pay | Admitting: Emergency Medicine

## 2013-06-27 ENCOUNTER — Emergency Department (HOSPITAL_COMMUNITY): Payer: Self-pay

## 2013-06-27 ENCOUNTER — Emergency Department (HOSPITAL_COMMUNITY)
Admission: EM | Admit: 2013-06-27 | Discharge: 2013-06-27 | Disposition: A | Discharge status: Home / Self Care | Payer: Self-pay | Attending: Emergency Medicine | Admitting: Emergency Medicine

## 2013-06-27 DIAGNOSIS — F17200 Nicotine dependence, unspecified, uncomplicated: Secondary | ICD-10-CM | POA: Insufficient documentation

## 2013-06-27 DIAGNOSIS — Z79899 Other long term (current) drug therapy: Secondary | ICD-10-CM | POA: Insufficient documentation

## 2013-06-27 DIAGNOSIS — Z87442 Personal history of urinary calculi: Secondary | ICD-10-CM | POA: Insufficient documentation

## 2013-06-27 DIAGNOSIS — J449 Chronic obstructive pulmonary disease, unspecified: Secondary | ICD-10-CM | POA: Insufficient documentation

## 2013-06-27 DIAGNOSIS — J4489 Other specified chronic obstructive pulmonary disease: Secondary | ICD-10-CM | POA: Insufficient documentation

## 2013-06-27 DIAGNOSIS — I10 Essential (primary) hypertension: Secondary | ICD-10-CM | POA: Insufficient documentation

## 2013-06-27 DIAGNOSIS — Z951 Presence of aortocoronary bypass graft: Secondary | ICD-10-CM | POA: Insufficient documentation

## 2013-06-27 DIAGNOSIS — I2510 Atherosclerotic heart disease of native coronary artery without angina pectoris: Secondary | ICD-10-CM | POA: Insufficient documentation

## 2013-06-27 DIAGNOSIS — Z8639 Personal history of other endocrine, nutritional and metabolic disease: Secondary | ICD-10-CM | POA: Insufficient documentation

## 2013-06-27 DIAGNOSIS — Z862 Personal history of diseases of the blood and blood-forming organs and certain disorders involving the immune mechanism: Secondary | ICD-10-CM | POA: Insufficient documentation

## 2013-06-27 MED ORDER — IPRATROPIUM BROMIDE 0.02 % IN SOLN
0.5000 mg | Freq: Once | RESPIRATORY_TRACT | Status: AC
  Administered 2013-06-27: 0.5 mg via RESPIRATORY_TRACT
  Filled 2013-06-27: qty 2.5

## 2013-06-27 MED ORDER — NICOTINE POLACRILEX 4 MG MT GUM: 4.0000 mg | CHEWING_GUM | OROMUCOSAL | Status: DC | PRN

## 2013-06-27 MED ORDER — SULFAMETHOXAZOLE-TRIMETHOPRIM 800-160 MG PO TABS: 1.0000 | ORAL_TABLET | Freq: Two times a day (BID) | ORAL | Status: AC

## 2013-06-27 MED ORDER — ACETAMINOPHEN 325 MG PO TABS
650.0000 mg | ORAL_TABLET | Freq: Once | ORAL | Status: AC
  Administered 2013-06-27: 650 mg via ORAL
  Filled 2013-06-27: qty 2

## 2013-06-27 MED ORDER — DEXAMETHASONE SODIUM PHOSPHATE 10 MG/ML IJ SOLN
10.0000 mg | Freq: Once | INTRAMUSCULAR | Status: AC
  Administered 2013-06-27: 10 mg via INTRAMUSCULAR
  Filled 2013-06-27: qty 1

## 2013-06-27 MED ORDER — ALBUTEROL SULFATE (2.5 MG/3ML) 0.083% IN NEBU
5.0000 mg | INHALATION_SOLUTION | Freq: Once | RESPIRATORY_TRACT | Status: AC
  Administered 2013-06-27: 5 mg via RESPIRATORY_TRACT
  Filled 2013-06-27: qty 6

## 2013-06-27 NOTE — ED Provider Notes (Signed)
CSN: [id number]     Arrival date & time 06/27/13  1026 History   First MD Initiated Contact with Patient 06/27/13 1121     Chief Complaint  Patient presents with  . Shortness of Breath      HPI Patient presents with wheezing, shortness of breath for the last several weeks.  Has increased productive sputum over the last few weeks.  Patient has decreased his smoking but continues to smoke.  History of COPD.  Denies any recent fever. Past Medical History  Diagnosis Date  . Coronary artery disease   . Hypertension   . Hypercholesteremia   . Kidney stone   . Tobacco use disorder   . Hx of CABG    Past Surgical History  Procedure Laterality Date  . Coronary artery bypass graft    . Kidney stone surgery     History reviewed. No pertinent family history. History  Substance Use Topics  . Smoking status: Current Every Day Smoker -- 1.00 packs/day for 31 years  . Smokeless tobacco: Not on file  . Alcohol Use: Yes     Comment: 12pk/day on weekends    Review of Systems  All other systems reviewed and are negative  Allergies  Review of patient's allergies indicates no known allergies.  Home Medications   Current Outpatient Rx  Name  Route  Sig  Dispense  Refill  . ibuprofen (ADVIL,MOTRIN) 200 MG tablet   Oral   Take 400 mg by mouth every 6 (six) hours as needed for mild pain.         . vitamin C (ASCORBIC ACID) 500 MG tablet   Oral   Take 2,000 mg by mouth daily.         . nicotine polacrilex (EQ NICOTINE) 4 MG gum   Oral   Take 1 each (4 mg total) by mouth as needed for smoking cessation.   100 tablet   0   . sulfamethoxazole-trimethoprim (BACTRIM DS,SEPTRA DS) 800-160 MG per tablet   Oral   Take 1 tablet by mouth 2 (two) times daily.   20 tablet   0    BP 137/81  Pulse 88  Temp(Src) 97.9 F (36.6 C) (Oral)  Resp 20  SpO2 97% Physical Exam  Nursing note and vitals reviewed. Constitutional: He is oriented to person, place, and time. He appears  well-developed and well-nourished. No distress.  HENT:  Head: Normocephalic and atraumatic.  Eyes: Pupils are equal, round, and reactive to light.  Neck: Normal range of motion.  Cardiovascular: Normal rate and intact distal pulses.   Pulmonary/Chest: No accessory muscle usage. Bradypnea noted. No respiratory distress. He has wheezes (Expiratory in all lung fields).  Abdominal: Normal appearance. He exhibits no distension.  Musculoskeletal: Normal range of motion.  Neurological: He is alert and oriented to person, place, and time. No cranial nerve deficit.  Skin: Skin is warm and dry. No rash noted.  Psychiatric: He has a normal mood and affect. His behavior is normal.    ED Course  Procedures (including critical care time)  Medications  albuterol (PROVENTIL) (2.5 MG/3ML) 0.083% nebulizer solution 5 mg (5 mg Nebulization Given 06/27/13 1153)  ipratropium (ATROVENT) nebulizer solution 0.5 mg (0.5 mg Nebulization Given 06/27/13 1153)  acetaminophen (TYLENOL) tablet 650 mg (650 mg Oral Given 06/27/13 1203)  dexamethasone (DECADRON) injection 10 mg (10 mg Intramuscular Given 06/27/13 1241)    Imaging Review Dg Chest 2 View  06/27/2013   CLINICAL DATA:  SHORTNESS OF BREATH  EXAM:  CHEST  2 VIEW  COMPARISON:  DG CHEST 2 VIEW dated 05/12/2013  FINDINGS: Patient is status post median sternotomy and coronary artery bypass grafting. Cardiac silhouette is unremarkable. The lungs clear. Osseous structures are unremarkable.   IMPRESSION: No active cardiopulmonary disease.    Electronically Signed   By: Salome Holmes M.D.   On: 06/27/2013 11:38      MDM   Final diagnoses:  COPD (chronic obstructive pulmonary disease)        Nelia Shi, MD 06/27/13 1308

## 2013-06-27 NOTE — Progress Notes (Signed)
[facility] provided pt with a list of primary care resources. Patient stated that he was pending insurance through job.  °

## 2013-06-27 NOTE — ED Notes (Signed)
MD notified of BP

## 2013-06-27 NOTE — ED Notes (Signed)
Pt states shortness of breath x 2 months.  Increasing for last week.  Pt states cough with phlegm.  Pt states no fever.  Pt states COPD hx.  Former smoker.

## 2013-06-27 NOTE — Discharge Instructions (Signed)
Chronic Obstructive Pulmonary Disease Exacerbation  Chronic obstructive pulmonary disease (COPD) is a common lung problem. In COPD, the flow of air from the lungs is limited. COPD exacerbations are times that breathing gets worse and you need extra treatment. Without treatment they can be life threatening. If they happen often, your lungs can become more damaged. HOME CARE  Do not smoke.  Avoid tobacco smoke and other things that bother your lungs.  If given, take your antibiotic medicine as told. Finish the medicine even if you start to feel better.  Only take medicines as told by your doctor.  Drink enough fluids to keep your pee (urine) clear or pale yellow (unless your doctor has told you not to).  Use a cool mist machine (vaporizer).  If you use oxygen or a machine that turns liquid medicine into a mist (nebulizer), continue to use them as told.  Keep up with shots (vaccinations) as told by your doctor.  Exercise regularly.  Eat healthy foods.  Keep all doctor visits as told. GET HELP RIGHT AWAY IF:  You are very short of breath and it gets worse.  You have trouble talking.  You have bad chest pain.  You have blood in your spit (sputum).  You have a fever.  You keep throwing up (vomiting).  You feel weak, or you pass out (faint).  You feel confused.  You keep getting worse. MAKE SURE YOU:   Understand these instructions.  Will watch your condition.  Will get help right away if you are not doing well or get worse. Document Released: 04/20/2011 Document Revised: 02/19/2013 Document Reviewed: 01/03/2013 Exit[employer] Patient Information 2014 [employer].  

## 2013-08-20 ENCOUNTER — Emergency Department (HOSPITAL_COMMUNITY): Payer: Self-pay

## 2013-08-20 ENCOUNTER — Encounter (HOSPITAL_COMMUNITY): Payer: Self-pay | Admitting: Emergency Medicine

## 2013-08-20 ENCOUNTER — Emergency Department (HOSPITAL_COMMUNITY)
Admission: EM | Admit: 2013-08-20 | Discharge: 2013-08-20 | Disposition: A | Discharge status: Home / Self Care | Payer: Self-pay | Attending: Emergency Medicine | Admitting: Emergency Medicine

## 2013-08-20 DIAGNOSIS — S52123A Displaced fracture of head of unspecified radius, initial encounter for closed fracture: Secondary | ICD-10-CM | POA: Insufficient documentation

## 2013-08-20 DIAGNOSIS — I10 Essential (primary) hypertension: Secondary | ICD-10-CM | POA: Insufficient documentation

## 2013-08-20 DIAGNOSIS — R1012 Left upper quadrant pain: Secondary | ICD-10-CM | POA: Insufficient documentation

## 2013-08-20 DIAGNOSIS — Z87442 Personal history of urinary calculi: Secondary | ICD-10-CM | POA: Insufficient documentation

## 2013-08-20 DIAGNOSIS — Y9389 Activity, other specified: Secondary | ICD-10-CM | POA: Insufficient documentation

## 2013-08-20 DIAGNOSIS — Z8639 Personal history of other endocrine, nutritional and metabolic disease: Secondary | ICD-10-CM | POA: Insufficient documentation

## 2013-08-20 DIAGNOSIS — F17200 Nicotine dependence, unspecified, uncomplicated: Secondary | ICD-10-CM | POA: Insufficient documentation

## 2013-08-20 DIAGNOSIS — IMO0002 Reserved for concepts with insufficient information to code with codable children: Secondary | ICD-10-CM | POA: Insufficient documentation

## 2013-08-20 DIAGNOSIS — R404 Transient alteration of awareness: Secondary | ICD-10-CM | POA: Insufficient documentation

## 2013-08-20 DIAGNOSIS — S298XXA Other specified injuries of thorax, initial encounter: Secondary | ICD-10-CM | POA: Insufficient documentation

## 2013-08-20 DIAGNOSIS — Z951 Presence of aortocoronary bypass graft: Secondary | ICD-10-CM | POA: Insufficient documentation

## 2013-08-20 DIAGNOSIS — I2510 Atherosclerotic heart disease of native coronary artery without angina pectoris: Secondary | ICD-10-CM | POA: Insufficient documentation

## 2013-08-20 DIAGNOSIS — Z862 Personal history of diseases of the blood and blood-forming organs and certain disorders involving the immune mechanism: Secondary | ICD-10-CM | POA: Insufficient documentation

## 2013-08-20 DIAGNOSIS — Y92410 Unspecified street and highway as the place of occurrence of the external cause: Secondary | ICD-10-CM | POA: Insufficient documentation

## 2013-08-20 LAB — CBC
HCT: 40.5 % (ref 39.0–52.0)
Hemoglobin: 14.6 g/dL (ref 13.0–17.0)
MCH: 32.8 pg (ref 26.0–34.0)
MCHC: 36 g/dL (ref 30.0–36.0)
MCV: 91 fL (ref 78.0–100.0)
Platelets: 276 10*3/uL (ref 150–400)
RBC: 4.45 MIL/uL (ref 4.22–5.81)
RDW: 12.8 % (ref 11.5–15.5)
WBC: 17.2 10*3/uL — ABNORMAL HIGH (ref 4.0–10.5)

## 2013-08-20 LAB — COMPREHENSIVE METABOLIC PANEL
ALT: 20 U/L (ref 0–53)
AST: 27 U/L (ref 0–37)
Albumin: 4.3 g/dL (ref 3.5–5.2)
Alkaline Phosphatase: 78 U/L (ref 39–117)
BILIRUBIN TOTAL: 0.6 mg/dL (ref 0.3–1.2)
BUN: 12 mg/dL (ref 6–23)
CO2: 22 meq/L (ref 19–32)
Calcium: 9.8 mg/dL (ref 8.4–10.5)
Chloride: 98 mEq/L (ref 96–112)
Creatinine, Ser: 0.97 mg/dL (ref 0.50–1.35)
GFR calc Af Amer: >90 mL/min (ref >90)
GFR calc non Af Amer: >90 mL/min (ref >90)
Glucose, Bld: 127 mg/dL — ABNORMAL HIGH (ref 70–99)
Potassium: 3.2 mEq/L — ABNORMAL LOW (ref 3.7–5.3)
SODIUM: 138 meq/L (ref 137–147)
Total Protein: 7.5 g/dL (ref 6.0–8.3)

## 2013-08-20 LAB — SAMPLE TO BLOOD BANK

## 2013-08-20 LAB — ETHANOL

## 2013-08-20 LAB — CDS SEROLOGY

## 2013-08-20 LAB — PROTIME-INR
INR: 0.94 (ref 0.00–1.49)
PROTHROMBIN TIME: 12.4 s (ref 11.6–15.2)

## 2013-08-20 MED ORDER — IOHEXOL 300 MG/ML  SOLN
100.0000 mL | Freq: Once | INTRAMUSCULAR | Status: AC | PRN
  Administered 2013-08-20: 100 mL via INTRAVENOUS

## 2013-08-20 MED ORDER — ONDANSETRON HCL 4 MG/2ML IJ SOLN
4.0000 mg | Freq: Once | INTRAMUSCULAR | Status: AC
  Administered 2013-08-20: 4 mg via INTRAVENOUS
  Filled 2013-08-20: qty 2

## 2013-08-20 MED ORDER — OXYCODONE-ACETAMINOPHEN 7.5-325 MG PO TABS
1.0000 | ORAL_TABLET | ORAL | Status: DC | PRN
Start: 2013-08-20 — End: 2013-09-07

## 2013-08-20 MED ORDER — HYDROMORPHONE HCL PF 1 MG/ML IJ SOLN
1.0000 mg | Freq: Once | INTRAMUSCULAR | Status: AC
  Administered 2013-08-20: 1 mg via INTRAVENOUS

## 2013-08-20 MED ORDER — HYDROMORPHONE HCL PF 1 MG/ML IJ SOLN
1.0000 mg | Freq: Once | INTRAMUSCULAR | Status: DC
  Filled 2013-08-20: qty 1

## 2013-08-20 MED ORDER — HYDROMORPHONE HCL PF 1 MG/ML IJ SOLN
1.0000 mg | Freq: Once | INTRAMUSCULAR | Status: AC
  Administered 2013-08-20: 1 mg via INTRAVENOUS
  Filled 2013-08-20: qty 1

## 2013-08-20 NOTE — ED Notes (Signed)
All abrasions cleaned and DSD applied.

## 2013-08-20 NOTE — ED Notes (Signed)
Pt in x-ray at this time

## 2013-08-20 NOTE — Discharge Instructions (Signed)
Elbow Fracture, Simple °A fracture is a break in one of the bones.When fractures are not displaced or separated, they may be treated with only a sling or splint. The sling or splint may only be required for two to three weeks. In these cases, often the elbow is put through early range of motion exercises to prevent the elbow from getting stiff. °DIAGNOSIS  °The diagnosis (learning what is wrong) of a fractured elbow is made by x-ray. These may be required before and after the elbow is put into a splint or cast. X-rays are taken after to make sure the bone pieces have not moved. °HOME CARE INSTRUCTIONS  °· Only take over-the-counter or prescription medicines for pain, discomfort, or fever as directed by your caregiver. °· If you have a splint held on with an elastic wrap and your hand or fingers become numb or cold and blue, loosen the wrap and reapply more loosely. See your caregiver if there is no relief. °· You may use ice for twenty minutes, four times per day, for the first two to three days. °· Use your elbow as directed. °· See your caregiver as directed. It is very important to keep all follow-up referrals and appointments in order to avoid any long-term problems with your elbow including chronic pain or stiffness. °SEEK IMMEDIATE MEDICAL CARE IF:  °· There is swelling or increasing pain in elbow. °· You begin to lose feeling or experience numbness or tingling in your hand or fingers. °· You develop swelling of the hand and fingers. °· You get a cold or blue hand or fingers on affected side. °MAKE SURE YOU:  °· Understand these instructions. °· Will watch your condition. °· Will get help right away if you are not doing well or get worse. °Document Released: 04/25/2001 Document Revised: 07/24/2011 Document Reviewed: 03/16/2009 °[employer]® Patient Information ©2014 [employer]. ° °
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
## 2013-08-20 NOTE — ED Notes (Signed)
Ortho tech paged for arm splint. 

## 2013-08-20 NOTE — ED Notes (Signed)
Pt returned from CT.  Pt's father at bedside.

## 2013-08-20 NOTE — Progress Notes (Signed)
Orthopedic Tech Progress Note Patient Details:  Steven ChangRoger L [name] 08/19/1970 [id number]  Ortho Devices Type of Ortho Device: Ace wrap;Post (long arm) splint;Arm sling Ortho Device/Splint Location: LUE Ortho Device/Splint Interventions: Ordered;Application   Jennye MoccasinAnthony Craig Ahaan Zobrist 08/20/2013, 10:33 PM

## 2013-08-20 NOTE — ED Notes (Signed)
Pt returned from xray

## 2013-08-20 NOTE — ED Notes (Signed)
Pt placed on 02 at 2LPM via [place] 
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
## 2013-08-20 NOTE — ED Notes (Signed)
Pt had refused to go to x-ray without pain med given first.  Meds given

## 2013-08-20 NOTE — ED Notes (Signed)
Pt st's pain has improved "a little".  Pt standing up beside stretcher.  Father at bedside.

## 2013-08-20 NOTE — ED Notes (Signed)
Pt remains in x-ray and CT at this time,

## 2013-08-21 NOTE — ED Provider Notes (Signed)
I have personally seen and examined the patient.  I have discussed the plan of care with the resident.  I have reviewed the documentation on PMH/FH/Soc. History.  I have reviewed the documentation of the resident and agree.  Pt seen walking around the ED in no distress, stable for d/c home  Joya Gaskinsonald W Leniya Breit, MD 08/21/13 1759

## 2013-08-22 ENCOUNTER — Encounter (HOSPITAL_COMMUNITY): Payer: Self-pay | Admitting: Emergency Medicine

## 2013-08-22 ENCOUNTER — Emergency Department (HOSPITAL_COMMUNITY): Payer: Self-pay

## 2013-08-22 ENCOUNTER — Emergency Department (HOSPITAL_COMMUNITY)
Admission: EM | Admit: 2013-08-22 | Discharge: 2013-08-22 | Disposition: A | Discharge status: Home / Self Care | Payer: Self-pay | Attending: Emergency Medicine | Admitting: Emergency Medicine

## 2013-08-22 DIAGNOSIS — I10 Essential (primary) hypertension: Secondary | ICD-10-CM | POA: Insufficient documentation

## 2013-08-22 DIAGNOSIS — F17200 Nicotine dependence, unspecified, uncomplicated: Secondary | ICD-10-CM | POA: Insufficient documentation

## 2013-08-22 DIAGNOSIS — R062 Wheezing: Secondary | ICD-10-CM | POA: Insufficient documentation

## 2013-08-22 DIAGNOSIS — J449 Chronic obstructive pulmonary disease, unspecified: Secondary | ICD-10-CM | POA: Insufficient documentation

## 2013-08-22 DIAGNOSIS — IMO0002 Reserved for concepts with insufficient information to code with codable children: Secondary | ICD-10-CM | POA: Insufficient documentation

## 2013-08-22 DIAGNOSIS — T148XXA Other injury of unspecified body region, initial encounter: Secondary | ICD-10-CM

## 2013-08-22 DIAGNOSIS — R071 Chest pain on breathing: Secondary | ICD-10-CM | POA: Insufficient documentation

## 2013-08-22 DIAGNOSIS — I2510 Atherosclerotic heart disease of native coronary artery without angina pectoris: Secondary | ICD-10-CM | POA: Insufficient documentation

## 2013-08-22 DIAGNOSIS — J4489 Other specified chronic obstructive pulmonary disease: Secondary | ICD-10-CM | POA: Insufficient documentation

## 2013-08-22 DIAGNOSIS — E7800 Pure hypercholesterolemia, unspecified: Secondary | ICD-10-CM | POA: Insufficient documentation

## 2013-08-22 DIAGNOSIS — R109 Unspecified abdominal pain: Secondary | ICD-10-CM | POA: Insufficient documentation

## 2013-08-22 DIAGNOSIS — Z951 Presence of aortocoronary bypass graft: Secondary | ICD-10-CM | POA: Insufficient documentation

## 2013-08-22 HISTORY — DX: Chronic obstructive pulmonary disease, unspecified: J44.9

## 2013-08-22 MED ORDER — IPRATROPIUM BROMIDE 0.02 % IN SOLN
0.5000 mg | Freq: Once | RESPIRATORY_TRACT | Status: AC
  Administered 2013-08-22: 0.5 mg via RESPIRATORY_TRACT
  Filled 2013-08-22: qty 2.5

## 2013-08-22 MED ORDER — HYDROCODONE-ACETAMINOPHEN 5-325 MG PO TABS: 1.0000 | ORAL_TABLET | Freq: Four times a day (QID) | ORAL | Status: DC | PRN

## 2013-08-22 MED ORDER — ALBUTEROL SULFATE (2.5 MG/3ML) 0.083% IN NEBU
5.0000 mg | INHALATION_SOLUTION | RESPIRATORY_TRACT | Status: AC
  Administered 2013-08-22 (×3): 5 mg via RESPIRATORY_TRACT
  Filled 2013-08-22 (×3): qty 6

## 2013-08-22 MED ORDER — HYDROMORPHONE HCL PF 2 MG/ML IJ SOLN
2.0000 mg | Freq: Once | INTRAMUSCULAR | Status: AC
  Administered 2013-08-22: 2 mg via INTRAMUSCULAR
  Filled 2013-08-22: qty 1

## 2013-08-22 MED ORDER — HYDROCODONE-ACETAMINOPHEN 5-325 MG PO TABS
2.0000 | ORAL_TABLET | Freq: Once | ORAL | Status: AC
  Administered 2013-08-22: 2 via ORAL
  Filled 2013-08-22: qty 2

## 2013-08-22 NOTE — ED Notes (Signed)
Patient was educated not to drive, operate heavy machinery, or drink alcohol while taking narcotic medication.  
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
## 2013-08-22 NOTE — Discharge Instructions (Signed)
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
## 2013-08-29 ENCOUNTER — Emergency Department (HOSPITAL_COMMUNITY)
Admission: EM | Admit: 2013-08-29 | Discharge: 2013-08-29 | Disposition: A | Discharge status: Home / Self Care | Payer: Self-pay | Attending: Emergency Medicine | Admitting: Emergency Medicine

## 2013-08-29 ENCOUNTER — Encounter (HOSPITAL_COMMUNITY): Payer: Self-pay | Admitting: Radiology

## 2013-08-29 ENCOUNTER — Emergency Department (HOSPITAL_COMMUNITY): Payer: Self-pay

## 2013-08-29 DIAGNOSIS — Z8639 Personal history of other endocrine, nutritional and metabolic disease: Secondary | ICD-10-CM | POA: Insufficient documentation

## 2013-08-29 DIAGNOSIS — M79609 Pain in unspecified limb: Secondary | ICD-10-CM

## 2013-08-29 DIAGNOSIS — J441 Chronic obstructive pulmonary disease with (acute) exacerbation: Secondary | ICD-10-CM | POA: Insufficient documentation

## 2013-08-29 DIAGNOSIS — S3981XA Other specified injuries of abdomen, initial encounter: Secondary | ICD-10-CM | POA: Insufficient documentation

## 2013-08-29 DIAGNOSIS — R799 Abnormal finding of blood chemistry, unspecified: Secondary | ICD-10-CM | POA: Insufficient documentation

## 2013-08-29 DIAGNOSIS — Y9355 Activity, bike riding: Secondary | ICD-10-CM | POA: Insufficient documentation

## 2013-08-29 DIAGNOSIS — F17200 Nicotine dependence, unspecified, uncomplicated: Secondary | ICD-10-CM | POA: Insufficient documentation

## 2013-08-29 DIAGNOSIS — Z862 Personal history of diseases of the blood and blood-forming organs and certain disorders involving the immune mechanism: Secondary | ICD-10-CM | POA: Insufficient documentation

## 2013-08-29 DIAGNOSIS — S42109A Fracture of unspecified part of scapula, unspecified shoulder, initial encounter for closed fracture: Secondary | ICD-10-CM | POA: Insufficient documentation

## 2013-08-29 DIAGNOSIS — S2249XA Multiple fractures of ribs, unspecified side, initial encounter for closed fracture: Secondary | ICD-10-CM | POA: Insufficient documentation

## 2013-08-29 DIAGNOSIS — M79606 Pain in leg, unspecified: Secondary | ICD-10-CM

## 2013-08-29 DIAGNOSIS — I252 Old myocardial infarction: Secondary | ICD-10-CM | POA: Insufficient documentation

## 2013-08-29 DIAGNOSIS — I10 Essential (primary) hypertension: Secondary | ICD-10-CM | POA: Insufficient documentation

## 2013-08-29 DIAGNOSIS — Z951 Presence of aortocoronary bypass graft: Secondary | ICD-10-CM | POA: Insufficient documentation

## 2013-08-29 DIAGNOSIS — R0902 Hypoxemia: Secondary | ICD-10-CM

## 2013-08-29 DIAGNOSIS — M7989 Other specified soft tissue disorders: Secondary | ICD-10-CM | POA: Insufficient documentation

## 2013-08-29 DIAGNOSIS — Z87442 Personal history of urinary calculi: Secondary | ICD-10-CM | POA: Insufficient documentation

## 2013-08-29 DIAGNOSIS — R109 Unspecified abdominal pain: Secondary | ICD-10-CM | POA: Insufficient documentation

## 2013-08-29 DIAGNOSIS — I2510 Atherosclerotic heart disease of native coronary artery without angina pectoris: Secondary | ICD-10-CM | POA: Insufficient documentation

## 2013-08-29 DIAGNOSIS — Y92410 Unspecified street and highway as the place of occurrence of the external cause: Secondary | ICD-10-CM | POA: Insufficient documentation

## 2013-08-29 LAB — RAPID URINE DRUG SCREEN, HOSP PERFORMED
AMPHETAMINES: NOT DETECTED
BARBITURATES: NOT DETECTED
BENZODIAZEPINES: NOT DETECTED
COCAINE: NOT DETECTED
OPIATES: POSITIVE — AB
TETRAHYDROCANNABINOL: POSITIVE — AB

## 2013-08-29 LAB — PROTIME-INR
INR: 0.94 (ref 0.00–1.49)
PROTHROMBIN TIME: 12.4 s (ref 11.6–15.2)

## 2013-08-29 LAB — CBC
HCT: 34.2 % — ABNORMAL LOW (ref 39.0–52.0)
Hemoglobin: 12.1 g/dL — ABNORMAL LOW (ref 13.0–17.0)
MCH: 32.2 pg (ref 26.0–34.0)
MCHC: 35.4 g/dL (ref 30.0–36.0)
MCV: 91 fL (ref 78.0–100.0)
PLATELETS: 359 10*3/uL (ref 150–400)
RBC: 3.76 MIL/uL — ABNORMAL LOW (ref 4.22–5.81)
RDW: 12.1 % (ref 11.5–15.5)
WBC: 8.8 10*3/uL (ref 4.0–10.5)

## 2013-08-29 LAB — BASIC METABOLIC PANEL
BUN: 12 mg/dL (ref 6–23)
CHLORIDE: 95 meq/L — AB (ref 96–112)
CO2: 27 mEq/L (ref 19–32)
CREATININE: 1.01 mg/dL (ref 0.50–1.35)
Calcium: 9.5 mg/dL (ref 8.4–10.5)
GFR, EST NON AFRICAN AMERICAN: 88 mL/min — AB (ref >90)
Glucose, Bld: 101 mg/dL — ABNORMAL HIGH (ref 70–99)
Potassium: 3.5 mEq/L — ABNORMAL LOW (ref 3.7–5.3)
Sodium: 135 mEq/L — ABNORMAL LOW (ref 137–147)

## 2013-08-29 LAB — URINALYSIS, ROUTINE W REFLEX MICROSCOPIC
BILIRUBIN URINE: NEGATIVE
GLUCOSE, UA: NEGATIVE mg/dL
HGB URINE DIPSTICK: NEGATIVE
Ketones, ur: NEGATIVE mg/dL
Leukocytes, UA: NEGATIVE
Nitrite: NEGATIVE
PH: 7 (ref 5.0–8.0)
Protein, ur: NEGATIVE mg/dL
SPECIFIC GRAVITY, URINE: 1.004 — AB (ref 1.005–1.030)
UROBILINOGEN UA: 1 mg/dL (ref 0.0–1.0)

## 2013-08-29 LAB — TROPONIN I: Troponin I: <0.3 ng/mL (ref <0.30)

## 2013-08-29 LAB — I-STAT TROPONIN, ED: Troponin i, poc: 0 ng/mL (ref 0.00–0.08)

## 2013-08-29 LAB — PRO B NATRIURETIC PEPTIDE: PRO B NATRI PEPTIDE: 246.3 pg/mL — AB (ref 0–125)

## 2013-08-29 LAB — ETHANOL: Alcohol, Ethyl (B): <11 mg/dL (ref 0–11)

## 2013-08-29 LAB — D-DIMER, QUANTITATIVE (NOT AT ARMC): D-Dimer, Quant: 2.39 ug/mL-FEU — ABNORMAL HIGH (ref 0.00–0.48)

## 2013-08-29 MED ORDER — HYDROMORPHONE HCL PF 1 MG/ML IJ SOLN
1.0000 mg | Freq: Once | INTRAMUSCULAR | Status: AC
  Administered 2013-08-29: 1 mg via INTRAVENOUS
  Filled 2013-08-29: qty 1

## 2013-08-29 MED ORDER — NITROGLYCERIN 0.4 MG SL SUBL
0.4000 mg | SUBLINGUAL_TABLET | SUBLINGUAL | Status: DC | PRN
  Filled 2013-08-29: qty 1

## 2013-08-29 MED ORDER — ASPIRIN 325 MG PO TABS
325.0000 mg | ORAL_TABLET | ORAL | Status: AC
  Administered 2013-08-29: 325 mg via ORAL
  Filled 2013-08-29: qty 1

## 2013-08-29 MED ORDER — IOHEXOL 350 MG/ML SOLN
100.0000 mL | Freq: Once | INTRAVENOUS | Status: AC | PRN
  Administered 2013-08-29: 100 mL via INTRAVENOUS

## 2013-08-29 MED ORDER — OXYCODONE-ACETAMINOPHEN 5-325 MG PO TABS
2.0000 | ORAL_TABLET | Freq: Once | ORAL | Status: AC
Start: 2013-08-29 — End: 2013-08-29
  Administered 2013-08-29: 2 via ORAL
  Filled 2013-08-29: qty 2

## 2013-08-29 MED ORDER — OXYCODONE-ACETAMINOPHEN 5-325 MG PO TABS: 2.0000 | ORAL_TABLET | ORAL | Status: DC | PRN

## 2013-08-29 MED ORDER — IOHEXOL 300 MG/ML  SOLN
50.0000 mL | Freq: Once | INTRAMUSCULAR | Status: AC | PRN
  Administered 2013-08-29: 50 mL via ORAL

## 2013-08-29 MED ORDER — ASPIRIN 81 MG PO CHEW: 324.0000 mg | CHEWABLE_TABLET | Freq: Once | ORAL | Status: DC

## 2013-08-29 NOTE — ED Notes (Signed)
Per Ray MD hold off on Dilaudid until CT results come back.
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
## 2013-08-29 NOTE — ED Notes (Signed)
Pt with Hx of MI and cardiac bypass surgery 3 years ago, states that today new onset "deep" medial chest pain, SOB, and swollen right leg.

## 2013-08-29 NOTE — ED Notes (Signed)
Pt reports productive cough with white sputum for a 1.5 months. Pt reports diagnosis of COPD. Pt states chest worse with cough.

## 2013-08-29 NOTE — Discharge Instructions (Signed)
You have 5 (Five) ribs fractured.  Please follow up with the orthopedist for your elbow and scapula fracture.     You have multiple rib fractures likely causing your chest pain.  Please take pain medicine as needed for pain.  Follow up with orthopedist as previously scheduled for elbow and scapula fractures.Rib Fracture A rib fracture is a break or crack in one of the bones of the ribs. The ribs are like a cage that goes around your upper chest. A broken or cracked rib is often painful, but most do not cause other problems. Most rib fractures heal on their own in 1 3 months. HOME CARE  Avoid activities that cause pain to the injured area. Protect your injured area.  Slowly increase activity as told by your doctor.  Take medicine as told by your doctor.  Put ice on the injured area for the first 1 2 days after you have been treated or as told by your doctor.  Put ice in a plastic bag.  Place a towel between your skin and the bag.  Leave the ice on for 15 20 minutes at a time, every 2 hours while you are awake.  Do deep breathing as told by your doctor. You may be told to:  Take deep breaths many times a day.  Cough many times a day while hugging a pillow.  Use a device (incentive spirometer) to perform deep breathing many times a day.  Drink enough fluids to keep your pee (urine) clear or pale yellow.   Do not wear a rib belt or binder. These do not allow you to breathe deeply. GET HELP RIGHT AWAY IF:   You have a fever.  You have trouble breathing.   You cannot stop coughing.  You cough up thick or bloody spit (mucus).   You feel sick to your stomach (nauseous), throw up (vomit), or have belly (abdominal) pain.   Your pain gets worse and medicine does not help.  MAKE SURE YOU:   Understand these instructions.  Will watch your condition.  Will get help right away if you are not doing well or get worse. Document Released: 02/08/2008 Document Revised: 08/26/2012  Document Reviewed: 07/03/2012 [facility] Patient Information 2014 [place].

## 2013-08-29 NOTE — Progress Notes (Signed)
[facility] provided pt with a list of primary care resources and a [facility] Orange Card application to help patient establish primary care.  °

## 2013-08-29 NOTE — Progress Notes (Signed)
VASCULAR LAB PRELIMINARY  PRELIMINARY  PRELIMINARY  PRELIMINARY  Right lower extremity venous duplex completed.    Preliminary report:  Right:  No evidence of DVT, superficial thrombosis, or Baker's cyst.  Steven HanlonVirginia D Lerry [name], RVS 08/29/2013, 7:59 PM

## 2013-08-29 NOTE — ED Notes (Signed)
Pt transported to CT ?

## 2013-08-29 NOTE — ED Notes (Signed)
Pt chewing tobacco at present time and is going to leave if he does not get pain medication yet.   Per Ray is reviewing CT with radiologist and will be in to speak to pt shortly regarding results. Ray MD confirms to continue holding pain medication administration at present time.

## 2013-08-29 NOTE — ED Notes (Signed)
Ray MD at bedside 

## 2013-08-29 NOTE — ED Notes (Addendum)
Pt denies chest pain at present time. Pt refuses nitro SL. Pt reports pain LLQ 8/10.   Pt states "this is not heart, it is like muscle pain."   Ray MD notified.

## 2013-08-29 NOTE — ED Notes (Addendum)
Pt took off heart monitor, blood pressure cuff, and oxygen detector. Pt changed and in regular clothing. Pt informed importance of monitoring VS and encouraged to change into gown and allow VS checks. Pt changing at present time.
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
## 2013-09-07 ENCOUNTER — Emergency Department (HOSPITAL_COMMUNITY): Payer: Self-pay

## 2013-09-07 ENCOUNTER — Encounter (HOSPITAL_COMMUNITY): Payer: Self-pay | Admitting: Emergency Medicine

## 2013-09-07 ENCOUNTER — Emergency Department (HOSPITAL_COMMUNITY)
Admission: EM | Admit: 2013-09-07 | Discharge: 2013-09-07 | Disposition: A | Discharge status: Home / Self Care | Payer: Self-pay | Attending: Emergency Medicine | Admitting: Emergency Medicine

## 2013-09-07 DIAGNOSIS — F17200 Nicotine dependence, unspecified, uncomplicated: Secondary | ICD-10-CM | POA: Insufficient documentation

## 2013-09-07 DIAGNOSIS — I2510 Atherosclerotic heart disease of native coronary artery without angina pectoris: Secondary | ICD-10-CM | POA: Insufficient documentation

## 2013-09-07 DIAGNOSIS — Z87442 Personal history of urinary calculi: Secondary | ICD-10-CM | POA: Insufficient documentation

## 2013-09-07 DIAGNOSIS — S2249XA Multiple fractures of ribs, unspecified side, initial encounter for closed fracture: Secondary | ICD-10-CM

## 2013-09-07 DIAGNOSIS — Z951 Presence of aortocoronary bypass graft: Secondary | ICD-10-CM | POA: Insufficient documentation

## 2013-09-07 DIAGNOSIS — J449 Chronic obstructive pulmonary disease, unspecified: Secondary | ICD-10-CM | POA: Insufficient documentation

## 2013-09-07 DIAGNOSIS — I10 Essential (primary) hypertension: Secondary | ICD-10-CM | POA: Insufficient documentation

## 2013-09-07 DIAGNOSIS — S2239XA Fracture of one rib, unspecified side, initial encounter for closed fracture: Secondary | ICD-10-CM

## 2013-09-07 DIAGNOSIS — IMO0001 Reserved for inherently not codable concepts without codable children: Secondary | ICD-10-CM | POA: Insufficient documentation

## 2013-09-07 DIAGNOSIS — Z862 Personal history of diseases of the blood and blood-forming organs and certain disorders involving the immune mechanism: Secondary | ICD-10-CM | POA: Insufficient documentation

## 2013-09-07 DIAGNOSIS — Z8639 Personal history of other endocrine, nutritional and metabolic disease: Secondary | ICD-10-CM | POA: Insufficient documentation

## 2013-09-07 DIAGNOSIS — J4489 Other specified chronic obstructive pulmonary disease: Secondary | ICD-10-CM | POA: Insufficient documentation

## 2013-09-07 MED ORDER — OXYCODONE-ACETAMINOPHEN 5-325 MG PO TABS
2.0000 | ORAL_TABLET | Freq: Once | ORAL | Status: DC
  Filled 2013-09-07: qty 2

## 2013-09-07 MED ORDER — OXYCODONE-ACETAMINOPHEN 7.5-325 MG PO TABS: 1.0000 | ORAL_TABLET | ORAL | Status: DC | PRN

## 2013-09-07 MED ORDER — DIAZEPAM 5 MG PO TABS: 5.0000 mg | ORAL_TABLET | Freq: Four times a day (QID) | ORAL | Status: DC | PRN

## 2013-09-07 NOTE — ED Provider Notes (Signed)
CSN: [id number]     Arrival date & time 09/07/13  0940 History   First MD Initiated Contact with Patient 09/07/13 [phone]     Chief Complaint  Patient presents with  . Chest Pain     (Consider location/radiation/quality/duration/timing/severity/associated sxs/prior Treatment) Patient is a 43 y.o. male presenting with chest pain. The history is provided by the patient.  Chest Pain Pain location:  L chest Pain quality: stabbing   Pain radiates to:  Does not radiate Pain radiates to the back: no   Pain severity:  Moderate Onset quality:  Gradual Duration:  2 weeks Timing:  Constant Progression:  Worsening Chronicity:  Recurrent Context: movement   Context: not breathing   Relieved by:  Nothing Worsened by:  Nothing tried Ineffective treatments:  None tried Associated symptoms: no cough, no fever and not vomiting   pt with known h/o left sided rib fractures--ran out of his pain meds  Past Medical History  Diagnosis Date  . Coronary artery disease   . Hypertension   . Hypercholesteremia   . Kidney stone   . Tobacco use disorder   . Hx of CABG   . COPD (chronic obstructive pulmonary disease)    Past Surgical History  Procedure Laterality Date  . Coronary artery bypass graft    . Kidney stone surgery     No family history on file. History  Substance Use Topics  . Smoking status: Current Every Day Smoker -- 1.00 packs/day for 31 years  . Smokeless tobacco: Not on file  . Alcohol Use: Yes     Comment: 12pk/day on weekends    Review of Systems  Constitutional: Negative for fever.  Respiratory: Negative for cough.   Cardiovascular: Positive for chest pain.  Gastrointestinal: Negative for vomiting.  All other systems reviewed and are negative.     Allergies  Review of patient's allergies indicates no known allergies.  Home Medications   Prior to Admission medications   Medication Sig Start Date End Date Taking? Authorizing Provider  HYDROcodone-acetaminophen  (NORCO/VICODIN) 5-325 MG per tablet Take 1 tablet by mouth every 6 (six) hours as needed. 08/22/13   Derwood KaplanAnkit Nanavati, MD  oxyCODONE-acetaminophen (PERCOCET) 7.5-325 MG per tablet Take 1 tablet by mouth every 4 (four) hours as needed for pain. 08/20/13   Pilar Jarvisoug Brtalik, MD  oxyCODONE-acetaminophen (PERCOCET/ROXICET) 5-325 MG per tablet Take 2 tablets by mouth every 4 (four) hours as needed for severe pain. 08/29/13   Hilario Quarryanielle S Ray, MD   BP 148/90  Pulse 87  Temp(Src) 97.6 F (36.4 C) (Oral)  Resp 22  SpO2 99% Physical Exam  Nursing note and vitals reviewed. Constitutional: He is oriented to person, place, and time. He appears well-developed and well-nourished.  Non-toxic appearance. No distress.  HENT:  Head: Normocephalic and atraumatic.  Eyes: Conjunctivae, EOM and lids are normal. Pupils are equal, round, and reactive to light.  Neck: Normal range of motion. Neck supple. No tracheal deviation present. No mass present.  Cardiovascular: Normal rate, regular rhythm and normal heart sounds.  Exam reveals no gallop.   No murmur heard. Pulmonary/Chest: Effort normal and breath sounds normal. No stridor. No respiratory distress. He has no decreased breath sounds. He has no wheezes. He has no rhonchi. He has no rales. He exhibits tenderness and bony tenderness. He exhibits no crepitus.    Abdominal: Soft. Normal appearance and bowel sounds are normal. He exhibits no distension. There is no tenderness. There is no rebound and no CVA tenderness.  Musculoskeletal: Normal  range of motion. He exhibits no edema and no tenderness.  Neurological: He is alert and oriented to person, place, and time. He has normal strength. No cranial nerve deficit or sensory deficit. GCS eye subscore is 4. GCS verbal subscore is 5. GCS motor subscore is 6.  Skin: Skin is warm and dry. No abrasion and no rash noted.  Psychiatric: He has a normal mood and affect. His speech is normal and behavior is normal.    ED Course    Procedures (including critical care time) Labs Review Labs Reviewed - No data to display  Imaging Review No results found.   EKG Interpretation None      MDM   Final diagnoses:  None   Patient given Percocet here for pain and does feel slightly better. Chest x-ray is negative. Patient to be given prescription for pain meds and will followup with his doctor.     Toy BakerAnthony T Sandrine Bloodsworth, MD 09/07/13 1200

## 2013-09-07 NOTE — Discharge Instructions (Signed)
Rib Fracture  A rib fracture is a break or crack in one of the bones of the ribs. The ribs are a group of long, curved bones that wrap around your chest and attach to your spine. They protect your lungs and other organs in the chest cavity. A broken or cracked rib is often painful, but most do not cause other problems. Most rib fractures heal on their own over time. However, rib fractures can be more serious if multiple ribs are broken or if broken ribs move out of place and push against other structures.  CAUSES   · A direct blow to the chest. For example, this could happen during contact sports, a car accident, or a fall against a hard object.  · Repetitive movements with high force, such as pitching a baseball or having severe coughing spells.  SYMPTOMS   · Pain when you breathe in or cough.  · Pain when someone presses on the injured area.  DIAGNOSIS   Your caregiver will perform a physical exam. Various imaging tests may be ordered to confirm the diagnosis and to look for related injuries. These tests may include a chest X-ray, computed tomography (CT), magnetic resonance imaging (MRI), or a bone scan.  TREATMENT   Rib fractures usually heal on their own in 1 3 months. The longer healing period is often associated with a continued cough or other aggravating activities. During the healing period, pain control is very important. Medication is usually given to control pain. Hospitalization or surgery may be needed for more severe injuries, such as those in which multiple ribs are broken or the ribs have moved out of place.   HOME CARE INSTRUCTIONS   · Avoid strenuous activity and any activities or movements that cause pain. Be careful during activities and avoid bumping the injured rib.  · Gradually increase activity as directed by your caregiver.  · Only take over-the-counter or prescription medications as directed by your caregiver. Do not take other medications without asking your caregiver first.  · Apply ice  to the injured area for the first 1 2 days after you have been treated or as directed by your caregiver. Applying ice helps to reduce inflammation and pain.  · Put ice in a plastic bag.  · Place a towel between your skin and the bag.    · Leave the ice on for 15 20 minutes at a time, every 2 hours while you are awake.  · Perform deep breathing as directed by your caregiver. This will help prevent pneumonia, which is a common complication of a broken rib. Your caregiver may instruct you to:  · Take deep breaths several times a day.  · Try to cough several times a day, holding a pillow against the injured area.  · Use a device called an incentive spirometer to practice deep breathing several times a day.  · Drink enough fluids to keep your urine clear or pale yellow. This will help you avoid constipation.    · Do not wear a rib belt or binder. These restrict breathing, which can lead to pneumonia.    SEEK IMMEDIATE MEDICAL CARE IF:   · You have a fever.    · You have difficulty breathing or shortness of breath.    · You develop a continual cough, or you cough up thick or bloody sputum.  · You feel sick to your stomach (nausea), throw up (vomit), or have abdominal pain.    · You have worsening pain not controlled with medications.      MAKE SURE YOU:  · Understand these instructions.  · Will watch your condition.  · Will get help right away if you are not doing well or get worse.  Document Released: 05/01/2005 Document Revised: 01/01/2013 Document Reviewed: 07/03/2012  ExitCare® Patient Information ©2014 [employer] LLC.

## 2013-09-07 NOTE — ED Notes (Signed)
Patient transported to X-ray 
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
## 2014-02-18 ENCOUNTER — Telehealth: Payer: Self-pay | Admitting: Emergency Medicine

## 2014-02-19 NOTE — Telephone Encounter (Signed)
Pt scheduled new pt appointment for chest pain with stents Scheduled with Dr. Daleen SquibbWall 02/25/2014

## 2014-02-25 ENCOUNTER — Encounter: Payer: Self-pay | Admitting: Cardiology

## 2014-02-25 ENCOUNTER — Ambulatory Visit: Payer: Self-pay | Attending: Cardiology | Admitting: Cardiology

## 2014-02-25 VITALS — BP 172/84 | HR 84 | Temp 98.9°F | Resp 18 | Ht 70.0 in | Wt 223.0 lb

## 2014-02-25 DIAGNOSIS — F1021 Alcohol dependence, in remission: Secondary | ICD-10-CM | POA: Insufficient documentation

## 2014-02-25 DIAGNOSIS — Z8249 Family history of ischemic heart disease and other diseases of the circulatory system: Secondary | ICD-10-CM | POA: Insufficient documentation

## 2014-02-25 DIAGNOSIS — Z87891 Personal history of nicotine dependence: Secondary | ICD-10-CM | POA: Insufficient documentation

## 2014-02-25 DIAGNOSIS — I10 Essential (primary) hypertension: Secondary | ICD-10-CM | POA: Insufficient documentation

## 2014-02-25 DIAGNOSIS — F1421 Cocaine dependence, in remission: Secondary | ICD-10-CM | POA: Insufficient documentation

## 2014-02-25 DIAGNOSIS — J449 Chronic obstructive pulmonary disease, unspecified: Secondary | ICD-10-CM | POA: Insufficient documentation

## 2014-02-25 DIAGNOSIS — Z7982 Long term (current) use of aspirin: Secondary | ICD-10-CM | POA: Insufficient documentation

## 2014-02-25 DIAGNOSIS — I25810 Atherosclerosis of coronary artery bypass graft(s) without angina pectoris: Secondary | ICD-10-CM | POA: Insufficient documentation

## 2014-02-25 DIAGNOSIS — E669 Obesity, unspecified: Secondary | ICD-10-CM | POA: Insufficient documentation

## 2014-02-25 DIAGNOSIS — E785 Hyperlipidemia, unspecified: Secondary | ICD-10-CM | POA: Insufficient documentation

## 2014-02-25 DIAGNOSIS — Z6832 Body mass index (BMI) 32.0-32.9, adult: Secondary | ICD-10-CM | POA: Insufficient documentation

## 2014-02-25 DIAGNOSIS — Z23 Encounter for immunization: Secondary | ICD-10-CM

## 2014-02-25 MED ORDER — LOSARTAN POTASSIUM 50 MG PO TABS: 50.0000 mg | ORAL_TABLET | Freq: Every day | ORAL | Status: DC

## 2014-02-25 MED ORDER — SIMVASTATIN 40 MG PO TABS: 40.0000 mg | ORAL_TABLET | Freq: Every day | ORAL | Status: DC

## 2014-02-25 MED ORDER — ASPIRIN 81 MG PO TABS: 81.0000 mg | ORAL_TABLET | Freq: Every day | ORAL | Status: DC

## 2014-02-25 NOTE — Addendum Note (Signed)
Addended by: Nonnie DoneSMITH, Peretz Thieme D on: 02/25/2014 03:37 PM   Modules accepted: Orders

## 2014-02-25 NOTE — Patient Instructions (Signed)
Hypertension Hypertension, commonly called high blood pressure, is when the force of blood pumping through your arteries is too strong. Your arteries are the blood vessels that carry blood from your heart throughout your body. A blood pressure reading consists of a higher number over a lower number, such as 110/72. The higher number (systolic) is the pressure inside your arteries when your heart pumps. The lower number (diastolic) is the pressure inside your arteries when your heart relaxes. Ideally you want your blood pressure below 120/80. Hypertension forces your heart to work harder to pump blood. Your arteries may become narrow or stiff. Having hypertension puts you at risk for heart disease, stroke, and other problems.  RISK FACTORS Some risk factors for high blood pressure are controllable. Others are not.  Risk factors you cannot control include:   Race. You may be at higher risk if you are African American.  Age. Risk increases with age.  Gender. Men are at higher risk than women before age 45 years. After age 65, women are at higher risk than men. Risk factors you can control include:  Not getting enough exercise or physical activity.  Being overweight.  Getting too much fat, sugar, calories, or salt in your diet.  Drinking too much alcohol. SIGNS AND SYMPTOMS Hypertension does not usually cause signs or symptoms. Extremely high blood pressure (hypertensive crisis) may cause headache, anxiety, shortness of breath, and nosebleed. DIAGNOSIS  To check if you have hypertension, your health care provider will measure your blood pressure while you are seated, with your arm held at the level of your heart. It should be measured at least twice using the same arm. Certain conditions can cause a difference in blood pressure between your right and left arms. A blood pressure reading that is higher than normal on one occasion does not mean that you need treatment. If one blood pressure reading  is high, ask your health care provider about having it checked again. TREATMENT  Treating high blood pressure includes making lifestyle changes and possibly taking medicine. Living a healthy lifestyle can help lower high blood pressure. You may need to change some of your habits. Lifestyle changes may include:  Following the DASH diet. This diet is high in fruits, vegetables, and whole grains. It is low in salt, red meat, and added sugars.  Getting at least 2 hours of brisk physical activity every week.  Losing weight if necessary.  Not smoking.  Limiting alcoholic beverages.  Learning ways to reduce stress. If lifestyle changes are not enough to get your blood pressure under control, your health care provider may prescribe medicine. You may need to take more than one. Work closely with your health care provider to understand the risks and benefits. HOME CARE INSTRUCTIONS  Have your blood pressure rechecked as directed by your health care provider.   Take medicines only as directed by your health care provider. Follow the directions carefully. Blood pressure medicines must be taken as prescribed. The medicine does not work as well when you skip doses. Skipping doses also puts you at risk for problems.   Do not smoke.   Monitor your blood pressure at home as directed by your health care provider. SEEK MEDICAL CARE IF:   You think you are having a reaction to medicines taken.  You have recurrent headaches or feel dizzy.  You have swelling in your ankles.  You have trouble with your vision. SEEK IMMEDIATE MEDICAL CARE IF:  You develop a severe headache or confusion.    You have unusual weakness, numbness, or feel faint.  You have severe chest or abdominal pain.  You vomit repeatedly.  You have trouble breathing. MAKE SURE YOU:   Understand these instructions.  Will watch your condition.  Will get help right away if you are not doing well or get worse. Document  Released: 05/01/2005 Document Revised: 09/15/2013 Document Reviewed: 02/21/2013 ExitCare Patient Information 2015 [employer]. This information is not intended to replace advice given to you by your health care provider. Make sure you discuss any questions you have with your health care provider. DASH Eating Plan DASH stands for "Dietary Approaches to Stop Hypertension." The DASH eating plan is a healthy eating plan that has been shown to reduce high blood pressure (hypertension). Additional health benefits may include reducing the risk of type 2 diabetes mellitus, heart disease, and stroke. The DASH eating plan may also help with weight loss. WHAT DO I NEED TO KNOW ABOUT THE DASH EATING PLAN? For the DASH eating plan, you will follow these general guidelines:  Choose foods with a percent daily value for sodium of less than 5% (as listed on the food label).  Use salt-free seasonings or herbs instead of table salt or sea salt.  Check with your health care provider or pharmacist before using salt substitutes.  Eat lower-sodium products, often labeled as "lower sodium" or "no salt added."  Eat fresh foods.  Eat more vegetables, fruits, and low-fat dairy products.  Choose whole grains. Look for the word "whole" as the first word in the ingredient list.  Choose fish and skinless chicken or turkey more often than red meat. Limit fish, poultry, and meat to 6 oz (170 g) each day.  Limit sweets, desserts, sugars, and sugary drinks.  Choose heart-healthy fats.  Limit cheese to 1 oz (28 g) per day.  Eat more home-cooked food and less restaurant, buffet, and fast food.  Limit fried foods.  Cook foods using methods other than frying.  Limit canned vegetables. If you do use them, rinse them well to decrease the sodium.  When eating at a restaurant, ask that your food be prepared with less salt, or no salt if possible. WHAT FOODS CAN I EAT? Seek help from a dietitian for individual  calorie needs. Grains Whole grain or whole wheat bread. Gores rice. Whole grain or whole wheat pasta. Quinoa, bulgur, and whole grain cereals. Low-sodium cereals. Corn or whole wheat flour tortillas. Whole grain cornbread. Whole grain crackers. Low-sodium crackers. Vegetables Fresh or frozen vegetables (raw, steamed, roasted, or grilled). Low-sodium or reduced-sodium tomato and vegetable juices. Low-sodium or reduced-sodium tomato sauce and paste. Low-sodium or reduced-sodium canned vegetables.  Fruits All fresh, canned (in natural juice), or frozen fruits. Meat and Other Protein Products Ground beef (85% or leaner), grass-fed beef, or beef trimmed of fat. Skinless chicken or turkey. Ground chicken or turkey. Pork trimmed of fat. All fish and seafood. Eggs. Dried beans, peas, or lentils. Unsalted nuts and seeds. Unsalted canned beans. Dairy Low-fat dairy products, such as skim or 1% milk, 2% or reduced-fat cheeses, low-fat ricotta or cottage cheese, or plain low-fat yogurt. Low-sodium or reduced-sodium cheeses. Fats and Oils Tub margarines without trans fats. Light or reduced-fat mayonnaise and salad dressings (reduced sodium). Avocado. Safflower, olive, or canola oils. Natural peanut or almond butter. Other Unsalted popcorn and pretzels. The items listed above may not be a complete list of recommended foods or beverages. Contact your dietitian for more options. WHAT FOODS ARE NOT RECOMMENDED? Grains White bread.   White pasta. White rice. Refined cornbread. Bagels and croissants. Crackers that contain trans fat. Vegetables Creamed or fried vegetables. Vegetables in a cheese sauce. Regular canned vegetables. Regular canned tomato sauce and paste. Regular tomato and vegetable juices. Fruits Dried fruits. Canned fruit in light or heavy syrup. Fruit juice. Meat and Other Protein Products Fatty cuts of meat. Ribs, chicken wings, bacon, sausage, bologna, salami, chitterlings, fatback, hot dogs,  bratwurst, and packaged luncheon meats. Salted nuts and seeds. Canned beans with salt. Dairy Whole or 2% milk, cream, half-and-half, and cream cheese. Whole-fat or sweetened yogurt. Full-fat cheeses or blue cheese. Nondairy creamers and whipped toppings. Processed cheese, cheese spreads, or cheese curds. Condiments Onion and garlic salt, seasoned salt, table salt, and sea salt. Canned and packaged gravies. Worcestershire sauce. Tartar sauce. Barbecue sauce. Teriyaki sauce. Soy sauce, including reduced sodium. Steak sauce. Fish sauce. Oyster sauce. Cocktail sauce. Horseradish. Ketchup and mustard. Meat flavorings and tenderizers. Bouillon cubes. Hot sauce. Tabasco sauce. Marinades. Taco seasonings. Relishes. Fats and Oils Butter, stick margarine, lard, shortening, ghee, and bacon fat. Coconut, palm kernel, or palm oils. Regular salad dressings. Other Pickles and olives. Salted popcorn and pretzels. The items listed above may not be a complete list of foods and beverages to avoid. Contact your dietitian for more information. WHERE CAN I FIND MORE INFORMATION? National Heart, Lung, and Blood Institute: CableProm[employer].itwww.nhlbi.nih.gov/health/health-topics/topics/dash/ Document Released: 04/20/2011 Document Revised: 09/15/2013 Document Reviewed: 03/05/2013 [facility] Patient Information 2015 [place], [place]. This information is not intended to replace advice given to you by your health care provider. Make sure you discuss any questions you have with your health care provider.    Thank you for coming in today to see Dr. Daleen SquibbWall It was a pleasure meeting you today! You need to come back in 2 weeks for BP nurse visit Fasting blood work ordered for 8 weeks Medication is at Pharmacy

## 2014-02-25 NOTE — Assessment & Plan Note (Signed)
Restart simvastatin. Check lipids and LFTs in 6 weeks.

## 2014-02-25 NOTE — Addendum Note (Signed)
Addended by: Nonnie DoneSMITH, JILL D on: 02/25/2014 04:35 PM   Modules accepted: Orders

## 2014-02-25 NOTE — Assessment & Plan Note (Signed)
Stable. He is having no angina. Will restart simvastatin 40 mg each bedtime with followup lipids and LFTs in 6 weeks. Also start on losartan for blood pressure control with blood pressure check in 2 weeks with the nursing staff. We will also start him back on aspirin 81 mg a day. I will have social services evaluate him for medical assistance.

## 2014-02-25 NOTE — Progress Notes (Signed)
Pt is here to establish care with Dr. Daleen SquibbWall for CABG x stent,HTN,COPD and cholesterol Pt has not been seen by provider for many years Not taking medication at this time  COPD worsening with sob  States he is attending AA meeting daily Denies chest pain,dizziness or headache
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
## 2014-02-25 NOTE — Assessment & Plan Note (Signed)
Began losartan 50 mg every morning. Blood pressure check in 2 weeks.

## 2014-03-17 ENCOUNTER — Ambulatory Visit: Payer: Self-pay | Admitting: Family Medicine

## 2014-03-18 ENCOUNTER — Ambulatory Visit: Payer: Self-pay

## 2014-03-23 ENCOUNTER — Encounter (HOSPITAL_COMMUNITY): Payer: Self-pay | Admitting: Emergency Medicine

## 2014-03-23 ENCOUNTER — Emergency Department (HOSPITAL_COMMUNITY)
Admission: EM | Admit: 2014-03-23 | Discharge: 2014-03-23 | Disposition: A | Discharge status: Home / Self Care | Payer: No Typology Code available for payment source | Attending: Emergency Medicine | Admitting: Emergency Medicine

## 2014-03-23 DIAGNOSIS — Z87891 Personal history of nicotine dependence: Secondary | ICD-10-CM | POA: Insufficient documentation

## 2014-03-23 DIAGNOSIS — R600 Localized edema: Secondary | ICD-10-CM | POA: Insufficient documentation

## 2014-03-23 DIAGNOSIS — K047 Periapical abscess without sinus: Secondary | ICD-10-CM

## 2014-03-23 DIAGNOSIS — Z79899 Other long term (current) drug therapy: Secondary | ICD-10-CM | POA: Insufficient documentation

## 2014-03-23 DIAGNOSIS — Z7982 Long term (current) use of aspirin: Secondary | ICD-10-CM | POA: Insufficient documentation

## 2014-03-23 DIAGNOSIS — Z951 Presence of aortocoronary bypass graft: Secondary | ICD-10-CM | POA: Insufficient documentation

## 2014-03-23 DIAGNOSIS — I10 Essential (primary) hypertension: Secondary | ICD-10-CM | POA: Insufficient documentation

## 2014-03-23 DIAGNOSIS — I2510 Atherosclerotic heart disease of native coronary artery without angina pectoris: Secondary | ICD-10-CM | POA: Insufficient documentation

## 2014-03-23 DIAGNOSIS — J449 Chronic obstructive pulmonary disease, unspecified: Secondary | ICD-10-CM | POA: Insufficient documentation

## 2014-03-23 DIAGNOSIS — E780 Pure hypercholesterolemia: Secondary | ICD-10-CM | POA: Insufficient documentation

## 2014-03-23 DIAGNOSIS — Z87442 Personal history of urinary calculi: Secondary | ICD-10-CM | POA: Insufficient documentation

## 2014-03-23 MED ORDER — PENICILLIN V POTASSIUM 500 MG PO TABS: 500.0000 mg | ORAL_TABLET | Freq: Four times a day (QID) | ORAL | Status: DC

## 2014-03-23 MED ORDER — HYDROCODONE-ACETAMINOPHEN 5-325 MG PO TABS: 1.0000 | ORAL_TABLET | ORAL | Status: DC | PRN

## 2014-03-23 MED ORDER — IBUPROFEN 800 MG PO TABS: 800.0000 mg | ORAL_TABLET | Freq: Three times a day (TID) | ORAL | Status: DC | PRN

## 2014-03-23 MED ORDER — HYDROCODONE-ACETAMINOPHEN 5-325 MG PO TABS
2.0000 | ORAL_TABLET | Freq: Once | ORAL | Status: AC
  Administered 2014-03-23: 2 via ORAL
  Filled 2014-03-23: qty 2

## 2014-03-23 NOTE — ED Notes (Signed)
Pt c/o left lower dental pain, has chipped tooth tat broke off a couple of days ago, now have swelling and increased pain to side.

## 2014-03-23 NOTE — ED Notes (Signed)
Initial Contact - pt A+Ox4, reports c/o broken tooth L lower jaw, pain and swelling since.  Mild swelling noted to cheek.  No difficulty clearing secretions or maintaining airway.  Skin PWD.  MAEI.  NAD.

## 2014-03-23 NOTE — ED Notes (Signed)
Awaiting ride to arrive prior to d/c.

## 2014-03-23 NOTE — ED Provider Notes (Signed)
CSN: [id number]     Arrival date & time 03/23/14  1242 History  This chart was scribed for non-physician practitioner, Trixie DredgeEmily Aristidis Talerico, PA-C working with Tilden FossaElizabeth Rees, MD by Greggory StallionKayla Andersen, ED scribe. This patient was seen in room WTR7/WTR7 and the patient's care was started at 1:02 PM.   Chief Complaint  Patient presents with  . Dental Pain  . Facial Swelling   The history is provided by the patient. No language interpreter was used.    HPI Comments: Steven [name] is a 43 y.o. male who presents to the Emergency Department complaining of worsening left lower dental pain with associated facial swelling that started 2 days ago. Pressure worsens the pain. Reports some pain on the left with swallowing. States he chipped his tooth a few weeks ago and has had intermittent pain since. He has used Listerine and taken Advil with no relief. Pt does not have a dentist. Denies fever, sore throat, trouble swallowing, difficulty breathing.   Past Medical History  Diagnosis Date  . Coronary artery disease   . Hypertension   . Hypercholesteremia   . Kidney stone   . Tobacco use disorder   . Hx of CABG   . COPD (chronic obstructive pulmonary disease)    Past Surgical History  Procedure Laterality Date  . Coronary artery bypass graft    . Kidney stone surgery    . Appendectomy     Family History  Problem Relation Age of Onset  . Heart disease Father   . Hyperlipidemia Father   . Heart disease Maternal Grandmother    History  Substance Use Topics  . Smoking status: Former Smoker -- 1.00 packs/day for 31 years  . Smokeless tobacco: Former NeurosurgeonUser    Quit date: 04/27/2013  . Alcohol Use: No     Comment: STATES HE QUIT 06/2013 ATTENDS DAILY AA MEETINGS    Review of Systems  Constitutional: Negative for fever.  HENT: Positive for dental problem and facial swelling. Negative for sore throat and trouble swallowing.   Respiratory: Negative for choking and shortness of breath.   Gastrointestinal:  Negative for nausea and vomiting.  Musculoskeletal: Negative for neck pain and neck stiffness.  Skin: Negative for color change and wound.  Allergic/Immunologic: Negative for immunocompromised state.  Neurological: Negative for speech difficulty and headaches.   Allergies  Review of patient's allergies indicates no known allergies.  Home Medications   Prior to Admission medications   Medication Sig Start Date End Date Taking? Authorizing Provider  aspirin 81 MG tablet Take 1 tablet (81 mg total) by mouth daily. 02/25/14   Gaylord Shihhomas C Wall, MD  diazepam (VALIUM) 5 MG tablet Take 1 tablet (5 mg total) by mouth every 6 (six) hours as needed for muscle spasms. 09/07/13   Toy BakerAnthony T Allen, MD  losartan (COZAAR) 50 MG tablet Take 1 tablet (50 mg total) by mouth daily. 02/25/14   Gaylord Shihhomas C Wall, MD  oxyCODONE-acetaminophen (PERCOCET) 7.5-325 MG per tablet Take 1 tablet by mouth every 4 (four) hours as needed for pain. 09/07/13   Toy BakerAnthony T Allen, MD  simvastatin (ZOCOR) 40 MG tablet Take 1 tablet (40 mg total) by mouth at bedtime. 02/25/14   Gaylord Shihhomas C Wall, MD   BP 146/86 mmHg  Pulse 87  Temp(Src) 98.5 F (36.9 C) (Oral)  Resp 20  SpO2 98%   Physical Exam  Constitutional: He appears well-developed and well-nourished. No distress.  HENT:  Head: Normocephalic and atraumatic.  Mouth/Throat: Oropharynx is clear and moist.  Dental abscesses present. No oropharyngeal exudate, posterior oropharyngeal edema, posterior oropharyngeal erythema or tonsillar abscesses.  Left lower bicuspid with severe decay. Tenderness to percussion. Adjacent area of swelling and tenderness over left mandible. No anterior neck or paratracheal tenderness to fullness. No induration or overlying erythema.   Neck: Neck supple.  Pulmonary/Chest: Effort normal.  Neurological: He is alert.  Skin: He is not diaphoretic.  Nursing note and vitals reviewed.   ED Course  Procedures (including critical care time)  DIAGNOSTIC  STUDIES: Oxygen Saturation is 98% on RA, normal by my interpretation.    COORDINATION OF CARE: 1:05 PM-Discussed treatment plan which includes an antibiotic and pain medication with pt at bedside and pt agreed to plan. Will give pt dental referrals and advised him to follow up.    Labs Review Labs Reviewed - No data to display  Imaging Review No results found.   EKG Interpretation None      MDM   Final diagnoses:  Dental abscess   Afebrile, nontoxic patient with left lower dental abscess.  No airway concerns.  Doubt Ludwig's angina.   D/C home with norco, penicillin, ibuprofen, dental follow up.   Discussed result, findings, treatment, and follow up  with patient.  Pt given return precautions.  Pt verbalizes understanding and agrees with plan.       I personally performed the services described in this documentation, which was scribed in my presence. The recorded information has been reviewed and is accurate.  Trixie Dredgemily Yaneliz Radebaugh, PA-C 03/23/14 1319  Tilden FossaElizabeth Rees, MD 03/23/14 [phone]

## 2014-03-23 NOTE — Discharge Instructions (Signed)
Read the information below.  Use the prescribed medication as directed.  Please discuss all new medications with your pharmacist.  Do not take additional tylenol while taking the prescribed pain medication to avoid overdose.  You may return to the Emergency Department at any time for worsening condition or any new symptoms that concern you.  Please call the dentist listed above within 48 hours to schedule a close follow up appointment.  If you develop fevers, swelling in your face, difficulty swallowing or breathing, return to the ER immediately for a recheck.   ° ° °Dental Abscess °A dental abscess is a collection of infected fluid (pus) from a bacterial infection in the inner part of the tooth (pulp). It usually occurs at the end of the tooth's root.  °CAUSES  °· Severe tooth decay. °· Trauma to the tooth that allows bacteria to enter into the pulp, such as a broken or chipped tooth. °SYMPTOMS  °· Severe pain in and around the infected tooth. °· Swelling and redness around the abscessed tooth or in the mouth or face. °· Tenderness. °· Pus drainage. °· Bad breath. °· Bitter taste in the mouth. °· Difficulty swallowing. °· Difficulty opening the mouth. °· Nausea. °· Vomiting. °· Chills. °· Swollen neck glands. °DIAGNOSIS  °· A medical and dental history will be taken. °· An examination will be performed by tapping on the abscessed tooth. °· X-rays may be taken of the tooth to identify the abscess. °TREATMENT °The goal of treatment is to eliminate the infection. You may be prescribed antibiotic medicine to stop the infection from spreading. A root canal may be performed to save the tooth. If the tooth cannot be saved, it may be pulled (extracted) and the abscess may be drained.  °HOME CARE INSTRUCTIONS °· Only take over-the-counter or prescription medicines for pain, fever, or discomfort as directed by your caregiver. °· Rinse your mouth (gargle) often with salt water (¼ tsp salt in 8 oz [250 ml] of warm water) to  relieve pain or swelling. °· Do not drive after taking pain medicine (narcotics). °· Do not apply heat to the outside of your face. °· Return to your dentist for further treatment as directed. °SEEK MEDICAL CARE IF: °· Your pain is not helped by medicine. °· Your pain is getting worse instead of better. °SEEK IMMEDIATE MEDICAL CARE IF: °· You have a fever or persistent symptoms for more than 2-3 days. °· You have a fever and your symptoms suddenly get worse. °· You have chills or a very bad headache. °· You have problems breathing or swallowing. °· You have trouble opening your mouth. °· You have swelling in the neck or around the eye. °Document Released: 05/01/2005 Document Revised: 01/24/2012 Document Reviewed: 08/09/2010 °ExitCare® Patient Information ©2015 [employer]. This information is not intended to replace advice given to you by your health care provider. Make sure you discuss any questions you have with your health care provider. ° ° °Emergency Department Resource Guide °1) Find a Doctor and Pay Out of Pocket °Although you won't have to find out who is covered by your insurance plan, it is a good idea to ask around and get recommendations. You will then need to call the office and see if the doctor you have chosen will accept you as a new patient and what types of options they offer for patients who are self-pay. Some doctors offer discounts or will set up payment plans for their patients who do not have insurance, but you   will need to ask so you aren't surprised when you get to your appointment. ° °2) Contact Your Local Health Department °Not all health departments have doctors that can see patients for sick visits, but many do, so it is worth a call to see if yours does. If you don't know where your local health department is, you can check in your phone book. The CDC also has a tool to help you locate your state's health department, and many state websites also have listings of all of their local  health departments. ° °3) Find a Walk-in Clinic °If your illness is not likely to be very severe or complicated, you may want to try a walk in clinic. These are popping up all over the country in pharmacies, drugstores, and shopping centers. They're usually staffed by nurse practitioners or physician assistants that have been trained to treat common illnesses and complaints. They're usually fairly quick and inexpensive. However, if you have serious medical issues or chronic medical problems, these are probably not your best option. ° °No Primary Care Doctor: °- Call [facility] at  832-8000 - they can help you locate a primary care doctor that  accepts your insurance, provides certain services, etc. °- Physician Referral Service- 1-800-533-3463 ° °Chronic Pain Problems: °Organization         Address  Phone   Notes  ° [facility]  (336) 297-2271 Patients need to be referred by their primary care doctor.  ° °Medication Assistance: °Organization         Address  Phone   Notes  °[place] [facility] [address] °[place], [place] [address] (336) 641-8030 --Must be a resident of [place] °-- Must have NO insurance coverage whatsoever (no Medicaid/ Medicare, etc.) °-- The pt. MUST have a primary care doctor that directs their care regularly and follows them in the community °  °[facility]  (866) 331-1348   °[employer]  (888) 892-1162   ° °Agencies that provide inexpensive medical care: °Organization         Address  Phone   Notes  °[facility]  (336) 832-8035   °[facility]    (336) 832-7272   °[facility] [address] °[place], [place] [address] (336) 832-4777   °[facility] [address], °[place] (336) 271-4999   °[facility]    (336) 373-0678   °[facility]    (336) 272-1050   °[facility] ° [address], [place] Phone:   (336) 832-4444, Fax:  (336) 832-4440 Hours of Operation:  9 am - 6 pm, M-F.  Also accepts Medicaid/Medicare and self-pay.  °[facility] ° [address], [place] Phone: (336) 832-3150, Fax: (336) 832-3151. Hours of Operation:  8:30 am - 5:30 pm, M-F.  Also accepts Medicaid and self-pay.  °[facility] [place] [address], [place] Phone: (336) 878-6027   °[facility] [address], [place], [place] (336)723-1848, Ext. 123 Mondays & Thursdays: 7-9 AM.  First 15 patients are seen on a first come, first serve basis. °  ° °Medicaid-accepting [place] Providers: ° °Organization         Address  Phone   Notes  °[facility] [address], [place] (336) 641-2100 Also accepts self-pay patients.  °[facility] [address],   [place] ° (336) 856-9996   °[facility] [address], [place] (336) 288-8857   °[facility] [address] [place] [address], [place] (336) 299-7000   °Veita Bland [address], [place]  ° (336) 373-1557 Only accepts [place] Access Medicaid patients after they have their name applied to their card.  ° °Self-Pay (no insurance) in [place]: ° °Organization         Address  Phone   Notes  °Sickle Cell Patients, [facility] [address], [place] (336) 832-1970   °[facility] [address], [place] (336) 832-4400   °[facility] ° [address], [place] (336) 992-4800   °[facility]/Dr. Osei-Bonsu ° [address] [place] [address], [place] or [address], [place] (336) 841-8500 Phone number for both [place] and [place] locations is the same.  °[facility] [address], [place] (336) 299-0000   °[facility] [address] [place] [address], [place] or [address] (336)  852-7530 °(336) 878-2260   °[facility] [address], [place] (336) 350-1642, phone; (336) 294-5005, fax Sees patients 1st and 3rd Saturday of every month.  Must not qualify for public or private insurance (i.e. Medicaid, Medicare, [place] Health Choice, Veterans' Benefits) • Household income should be no more than 200% of the poverty level •The clinic cannot treat you if you are pregnant or think you are pregnant • Sexually transmitted diseases are not treated at the clinic.  ° ° °Dental Care: °Organization         Address  Phone  Notes  °[facility] [address],  (336) 641-6152 Accepts children up to age 21 who are enrolled in Medicaid or [place] Health Choice; pregnant women with a Medicaid card; and children who have applied for Medicaid or [place] Health Choice, but were declined, whose parents can pay a reduced fee at time of service.  °[facility] [place]  [address], [place] (336) 641-7733 Accepts children up to age 21 who are enrolled in Medicaid or [place] Health Choice; pregnant women with a Medicaid card; and children who have applied for Medicaid or [place] Health Choice, but were declined, whose parents can pay a reduced fee at time of service.  °[facility] ° [address],  (336) 641-4533 Patients are seen by appointment only. Walk-ins are not accepted. [facility] will see patients 18 years of age and older. °Monday - Tuesday (8am-5pm) °Most Wednesdays (8:30-5pm) °$30 per visit, cash only  °[facility] ° [address], [place] (336) 641-4533 Patients are seen by appointment only. Walk-ins are not accepted. [facility] will see patients 18 years of age and older. °One Wednesday Evening (Monthly: Volunteer Based).  $30 per visit, cash only  °[facility]  (919) 537-3737 for adults;  Children under age 4, call Graduate Pediatric Dentistry at ([phone]) [phone]. Children aged 4-14, please call (919) [phone] to request a pediatric application. ° Dental services are provided in all areas of dental care including fillings, crowns and bridges, complete and partial dentures, implants, gum treatment, root canals, and extractions. Preventive care is also provided. Treatment is provided to both adults and children. °Patients are selected via a lottery and   there is often a waiting list. °  °[facility] [address], °[place] ° (336) 763-8833 [email or web address] °  °[facility] [address], [place], [place] (336)723-1848, Ext. 123 Second and Fourth Thursday of each month, opens at 6:30 AM; Clinic ends at 9 AM.  Patients are seen on a first-come first-served basis, and a limited number are seen during each clinic.  ° °[facility] ° [address], [place], [place] (336) 723-7904   Eligibility Requirements °You must have lived in [place], [place], or [place] counties for at least the last three months. °  You cannot be eligible for state or federal sponsored healthcare insurance, including Veterans Administration, Medicaid, or Medicare. °  You generally cannot be eligible for healthcare insurance through your employer.  °  How to apply: °Eligibility screenings are held every Tuesday and Wednesday afternoon from 1:00 pm until 4:00 pm. You do not need an appointment for the interview!  °[facility] [address], [place], [place] 336-631-2330   °[facility]  336-342-8273   °[facility]  336-703-3100   °[facility]  336-570-6415   ° °Behavioral Health Resources in the Community: °Intensive Outpatient Programs °Organization         Address  Phone  Notes  °[facility] [address], [place], [place] 336-878-6098   °[facility] [address], [place], [place] 336-832-9800   °[facility] [address], [place], [place] ° 336-882-2125   °[facility] [address],  °[place], [place] 1-800-853-5163 or 336-641-4981   °Substance Abuse Resources °Organization         Address  Phone  Notes  °[facility]  336-882-2125   °[facility]  336-784-9470   °[facility]  336-285-9073   °[facility]  336-845-3988   °Residential & Outpatient Substance Abuse Program  1-800-659-3381   °Psychological Services °Organization         Address  Phone  Notes  °[facility]  336- 832-9600   °[facility]  336- 378-7881   °[facility] [address], [place] 1-800-853-5163 or 336-641-4981   ° °Mobile Crisis Teams °Organization         Address  Phone  Notes  °[facility], Mobile Crisis Care Unit  1-877-626-1772   °Assertive °[facility] ° [address] [place], [place] 336-834-9664   °Sharon DeEsch [address] °[place] 336-554-5454   ° °Self-Help/Support Groups °Organization         Address  Phone             Notes  °[facility] [place] [facility] - variety of support groups  336- 373-1402 Call for more information  °Narcotics Anonymous (NA), [facility] [address], °[place]  2 meetings at this location  ° °Residential Treatment Programs °Organization         Address  Phone  Notes  °[facility] [address],    °[place]  1-866-801-8205   °[facility] ° [address], [place], [place] 704-293-8524   °[facility] [address], [place] 336-845-3988 Admissions: 8am-3pm M-F  °[facility] [address]    °[place], [place] 336-841-1104   °[facility] [address], [place],  336-379-7146   °  [facility] [address]  °[place], [place] 336-285-9073   °[facility] - Intensive  Outpatient [address], [place], [place] 336-852-3033   °[facility] [address]  °[place], [place] 1-877-615-2722 or 336-784-9470   °[facility] ([facility] [address] [place], [place] 336-227-7417 Accepts Medicaid  °[facility] [address]  °[place] 1-800-659-3381 Substance Abuse/Addiction Treatment  ° °[place] Behavioral Health Resources °Organization         Address  Phone  Notes  °[facility]  (888) 581-9988   °Julie Brannon, PhD [address] [place], [place]   (336) 349-5553 or (336) 951-0000   °[facility]   [address] °[place], [place] (336) 349-4454   °[facility] [address], [place], [place] (336) 342-8316 Insurance/Medicaid/sponsorship through [facility]  °[facility] [address]                                    [place], [place] (336) 342-8316 Therapy/tele-psych/case  °[facility] [address]  ° [place], [place] (336) 349-2233    °Dr. Arfeen  (336) 349-4544   °[facility] [place]  [employer] [place] [facility] 1) [address], [place] °2) [address], [place] °3)  [address], [place] (336) 349-3220 °(336) 342-7768 ° °(336) 342-8140   °[place] [facility] (336) 342-1394 or (336) 342-3537 (After Hours)    ° ° ° ° °

## 2014-03-27 ENCOUNTER — Ambulatory Visit: Payer: Self-pay

## 2014-04-07 ENCOUNTER — Other Ambulatory Visit: Payer: Self-pay

## 2014-04-23 ENCOUNTER — Encounter (HOSPITAL_COMMUNITY): Payer: Self-pay | Admitting: Interventional Cardiology

## 2014-07-06 ENCOUNTER — Encounter (HOSPITAL_COMMUNITY): Payer: Self-pay | Admitting: Emergency Medicine

## 2014-07-06 ENCOUNTER — Emergency Department (HOSPITAL_COMMUNITY)
Admission: EM | Admit: 2014-07-06 | Discharge: 2014-07-06 | Disposition: A | Discharge status: Home / Self Care | Payer: Self-pay | Attending: Emergency Medicine | Admitting: Emergency Medicine

## 2014-07-06 ENCOUNTER — Emergency Department (HOSPITAL_COMMUNITY): Payer: Self-pay

## 2014-07-06 DIAGNOSIS — E780 Pure hypercholesterolemia: Secondary | ICD-10-CM | POA: Insufficient documentation

## 2014-07-06 DIAGNOSIS — J449 Chronic obstructive pulmonary disease, unspecified: Secondary | ICD-10-CM | POA: Insufficient documentation

## 2014-07-06 DIAGNOSIS — Z792 Long term (current) use of antibiotics: Secondary | ICD-10-CM | POA: Insufficient documentation

## 2014-07-06 DIAGNOSIS — I10 Essential (primary) hypertension: Secondary | ICD-10-CM | POA: Insufficient documentation

## 2014-07-06 DIAGNOSIS — R112 Nausea with vomiting, unspecified: Secondary | ICD-10-CM | POA: Insufficient documentation

## 2014-07-06 DIAGNOSIS — I2510 Atherosclerotic heart disease of native coronary artery without angina pectoris: Secondary | ICD-10-CM | POA: Insufficient documentation

## 2014-07-06 DIAGNOSIS — Z720 Tobacco use: Secondary | ICD-10-CM | POA: Insufficient documentation

## 2014-07-06 DIAGNOSIS — R109 Unspecified abdominal pain: Secondary | ICD-10-CM | POA: Insufficient documentation

## 2014-07-06 DIAGNOSIS — Z87442 Personal history of urinary calculi: Secondary | ICD-10-CM | POA: Insufficient documentation

## 2014-07-06 DIAGNOSIS — N50811 Right testicular pain: Secondary | ICD-10-CM

## 2014-07-06 DIAGNOSIS — Z951 Presence of aortocoronary bypass graft: Secondary | ICD-10-CM | POA: Insufficient documentation

## 2014-07-06 DIAGNOSIS — Z79899 Other long term (current) drug therapy: Secondary | ICD-10-CM | POA: Insufficient documentation

## 2014-07-06 DIAGNOSIS — Z7982 Long term (current) use of aspirin: Secondary | ICD-10-CM | POA: Insufficient documentation

## 2014-07-06 DIAGNOSIS — Z9089 Acquired absence of other organs: Secondary | ICD-10-CM | POA: Insufficient documentation

## 2014-07-06 LAB — URINALYSIS, ROUTINE W REFLEX MICROSCOPIC
BILIRUBIN URINE: NEGATIVE
GLUCOSE, UA: NEGATIVE mg/dL
HGB URINE DIPSTICK: NEGATIVE
Ketones, ur: NEGATIVE mg/dL
LEUKOCYTES UA: NEGATIVE
NITRITE: NEGATIVE
PROTEIN: NEGATIVE mg/dL
Specific Gravity, Urine: 1.014 (ref 1.005–1.030)
UROBILINOGEN UA: 1 mg/dL (ref 0.0–1.0)
pH: 7 (ref 5.0–8.0)

## 2014-07-06 LAB — I-STAT CHEM 8, ED
BUN: 15 mg/dL (ref 6–23)
CALCIUM ION: 1.2 mmol/L (ref 1.12–1.23)
CHLORIDE: 104 mmol/L (ref 96–112)
Creatinine, Ser: 0.9 mg/dL (ref 0.50–1.35)
Glucose, Bld: 87 mg/dL (ref 70–99)
HEMATOCRIT: 45 % (ref 39.0–52.0)
Hemoglobin: 15.3 g/dL (ref 13.0–17.0)
Potassium: 3.3 mmol/L — ABNORMAL LOW (ref 3.5–5.1)
SODIUM: 143 mmol/L (ref 135–145)
TCO2: 25 mmol/L (ref 0–100)

## 2014-07-06 MED ORDER — OXYCODONE-ACETAMINOPHEN 5-325 MG PO TABS: 1.0000 | ORAL_TABLET | ORAL | Status: DC | PRN

## 2014-07-06 MED ORDER — ONDANSETRON HCL 4 MG/2ML IJ SOLN
4.0000 mg | Freq: Once | INTRAMUSCULAR | Status: AC
  Administered 2014-07-06: 4 mg via INTRAVENOUS
  Filled 2014-07-06: qty 2

## 2014-07-06 MED ORDER — KETOROLAC TROMETHAMINE 30 MG/ML IJ SOLN
30.0000 mg | Freq: Once | INTRAMUSCULAR | Status: AC
  Administered 2014-07-06: 30 mg via INTRAVENOUS
  Filled 2014-07-06: qty 1

## 2014-07-06 MED ORDER — POTASSIUM CHLORIDE CRYS ER 20 MEQ PO TBCR
80.0000 meq | EXTENDED_RELEASE_TABLET | Freq: Once | ORAL | Status: AC
  Administered 2014-07-06: 80 meq via ORAL
  Filled 2014-07-06: qty 4

## 2014-07-06 NOTE — ED Notes (Signed)
Pt sts right flank pain similar pain to when had kidney stone in past x 4 days

## 2014-07-06 NOTE — Discharge Instructions (Signed)
1. Medications: percocet, usual home medications 2. Treatment: rest, drink plenty of fluids,  3. Follow Up: Please followup with your primary doctor in 1 day for discussion of your diagnoses and further evaluation after today's visit; if you do not have a primary care doctor use the resource guide provided to find one; Please return to the ER for worsening pain, persistent vomiting, fever or other concerning symptoms.    Flank Pain Flank pain refers to pain that is located on the side of the body between the upper abdomen and the back. The pain may occur over a short period of time (acute) or may be long-term or reoccurring (chronic). It may be mild or severe. Flank pain can be caused by many things. CAUSES  Some of the more common causes of flank pain include:  Muscle strains.   Muscle spasms.   A disease of your spine (vertebral disk disease).   A lung infection (pneumonia).   Fluid around your lungs (pulmonary edema).   A kidney infection.   Kidney stones.   A very painful skin rash caused by the chickenpox virus (shingles).   Gallbladder disease.  HOME CARE INSTRUCTIONS  Home care will depend on the cause of your pain. In general,  Rest as directed by your caregiver.  Drink enough fluids to keep your urine clear or pale yellow.  Only take over-the-counter or prescription medicines as directed by your caregiver. Some medicines may help relieve the pain.  Tell your caregiver about any changes in your pain.  Follow up with your caregiver as directed. SEEK IMMEDIATE MEDICAL CARE IF:   Your pain is not controlled with medicine.   You have new or worsening symptoms.  Your pain increases.   You have abdominal pain.   You have shortness of breath.   You have persistent nausea or vomiting.   You have swelling in your abdomen.   You feel faint or pass out.   You have blood in your urine.  You have a fever or persistent symptoms for more than 2-3  days.  You have a fever and your symptoms suddenly get worse. MAKE SURE YOU:   Understand these instructions.  Will watch your condition.  Will get help right away if you are not doing well or get worse. Document Released: 06/22/2005 Document Revised: 01/24/2012 Document Reviewed: 12/14/2011 [facility] Patient Information 2015 [place] Summit[employer]Care, [place]. This information is not intended to replace advice given to you by your health care provider. Make sure you discuss any questions you have with your health care provider.

## 2014-07-06 NOTE — ED Provider Notes (Signed)
CSN: [id number]     Arrival date & time 07/06/14  1119 History   First MD Initiated Contact with Patient 07/06/14 1503     Chief Complaint  Patient presents with  . Flank Pain     (Consider location/radiation/quality/duration/timing/severity/associated sxs/prior Treatment) Patient is a 44 y.o. male presenting with flank pain. The history is provided by the patient and medical records. No language interpreter was used.  Flank Pain Associated symptoms include nausea and vomiting. Pertinent negatives include no abdominal pain, chest pain, coughing, diaphoresis, fatigue, fever, headaches or rash.     Steven [name] is a 44 y.o. male  with a hx of CAD with bypass in 2012 at [facility], COPD, tobacco use disorder, HTN, kidney stones presents to the Emergency Department complaining of gradual, persistent, progressively worsening right flank pain onset 3 days ago.  Pt reports pain is sharp and stabbing, rated at a 10/10 with radiation into the right abd and right testicle.  Pt reports symptoms are the same as his last kidney stone which was > 7 years ago.  Pt reports nausea and vomiting yesterday and again this morning with hematuria throughout the weekend.  Pt reports no treatments PTA.  Pt reports he is followed by Dr. Daleen Squibb for his cardiac care and has been compliant with his medications.  He denies fever, chills, HA, neck pain, neck stiffness, weakness, dizziness, syncope. No aggravating or alleviating factors.     Past Medical History  Diagnosis Date  . Coronary artery disease   . Hypertension   . Hypercholesteremia   . Kidney stone   . Tobacco use disorder   . Hx of CABG   . COPD (chronic obstructive pulmonary disease)    Past Surgical History  Procedure Laterality Date  . Coronary artery bypass graft    . Kidney stone surgery    . Appendectomy    . Lower extremity angiogram N/A 08/15/2012    Procedure: LOWER EXTREMITY ANGIOGRAM with possible PTA /stent;  Surgeon: Corky Crafts, MD;   Location: [facility] CATH LAB;  Service: Cardiovascular;  Laterality: N/A;  . Abdominal angiogram  08/15/2012    Procedure: ABDOMINAL ANGIOGRAM;  Surgeon: Corky Crafts, MD;  Location: [facility] CATH LAB;  Service: Cardiovascular;;  . Cardioversion N/A 08/28/2012    Procedure: Pseudo Compression;  Surgeon: Chuck Hint, MD;  Location: [facility] CATH LAB;  Service: Cardiovascular;  Laterality: N/A;   Family History  Problem Relation Age of Onset  . Heart disease Father   . Hyperlipidemia Father   . Heart disease Maternal Grandmother    History  Substance Use Topics  . Smoking status: Current Every Day Smoker -- 1.00 packs/day for 31 years  . Smokeless tobacco: Former Neurosurgeon    Quit date: 04/27/2013  . Alcohol Use: No     Comment: STATES HE QUIT 06/2013 ATTENDS DAILY AA MEETINGS    Review of Systems  Constitutional: Negative for fever, diaphoresis, appetite change, fatigue and unexpected weight change.  HENT: Negative for mouth sores.   Eyes: Negative for visual disturbance.  Respiratory: Negative for cough, chest tightness, shortness of breath and wheezing.   Cardiovascular: Negative for chest pain.  Gastrointestinal: Positive for nausea and vomiting. Negative for abdominal pain, diarrhea and constipation.  Endocrine: Negative for polydipsia, polyphagia and polyuria.  Genitourinary: Positive for hematuria, flank pain and testicular pain. Negative for dysuria, urgency and frequency.  Musculoskeletal: Negative for back pain and neck stiffness.  Skin: Negative for rash.  Allergic/Immunologic: Negative for immunocompromised state.  Neurological: Negative for syncope, light-headedness and headaches.  Hematological: Does not bruise/bleed easily.  Psychiatric/Behavioral: Negative for sleep disturbance. The patient is not nervous/anxious.       Allergies  Review of patient's allergies indicates no known allergies.  Home Medications   Prior to Admission medications   Medication Sig Start Date  End Date Taking? Authorizing Provider  aspirin 81 MG tablet Take 1 tablet (81 mg total) by mouth daily. Patient not taking: Reported on 07/06/2014 02/25/14   Gaylord Shih, MD  diazepam (VALIUM) 5 MG tablet Take 1 tablet (5 mg total) by mouth every 6 (six) hours as needed for muscle spasms. Patient not taking: Reported on 07/06/2014 09/07/13   Toy Baker, MD  HYDROcodone-acetaminophen (NORCO/VICODIN) 5-325 MG per tablet Take 1-2 tablets by mouth every 4 (four) hours as needed for moderate pain or severe pain. Patient not taking: Reported on 07/06/2014 03/23/14   Trixie Dredge, PA-C  ibuprofen (ADVIL,MOTRIN) 800 MG tablet Take 1 tablet (800 mg total) by mouth every 8 (eight) hours as needed for mild pain or moderate pain. Patient not taking: Reported on 07/06/2014 03/23/14   Trixie Dredge, PA-C  losartan (COZAAR) 50 MG tablet Take 1 tablet (50 mg total) by mouth daily. Patient not taking: Reported on 07/06/2014 02/25/14   Gaylord Shih, MD  oxyCODONE-acetaminophen (PERCOCET) 5-325 MG per tablet Take 1-2 tablets by mouth every 4 (four) hours as needed. 07/06/14   Hajar Penninger, PA-C  penicillin v potassium (VEETID) 500 MG tablet Take 1 tablet (500 mg total) by mouth 4 (four) times daily. Patient not taking: Reported on 07/06/2014 03/23/14   Trixie Dredge, PA-C  simvastatin (ZOCOR) 40 MG tablet Take 1 tablet (40 mg total) by mouth at bedtime. Patient not taking: Reported on 07/06/2014 02/25/14   Gaylord Shih, MD   BP 147/84 mmHg  Pulse 87  Temp(Src) 98.5 F (36.9 C) (Oral)  Resp 18  Ht 5\' 10"  (1.778 m)  Wt 205 lb (92.987 kg)  BMI 29.41 kg/m2  SpO2 98% Physical Exam  Constitutional: He appears well-developed and well-nourished. No distress.  Awake, alert, nontoxic appearance  HENT:  Head: Normocephalic and atraumatic.  Mouth/Throat: Oropharynx is clear and moist. No oropharyngeal exudate.  Eyes: Conjunctivae are normal. No scleral icterus.  Neck: Normal range of motion. Neck supple.   Cardiovascular: Normal rate, regular rhythm, normal heart sounds and intact distal pulses.   No murmur heard. Pulmonary/Chest: Effort normal and breath sounds normal. No respiratory distress. He has no wheezes.  Equal chest expansion  Abdominal: Soft. Bowel sounds are normal. He exhibits no distension and no mass. There is no tenderness. There is no rebound, no guarding and no CVA tenderness.  abd soft and nontender No CVA tenderness  Musculoskeletal: Normal range of motion. He exhibits no edema.  Neurological: He is alert.  Speech is clear and goal oriented Moves extremities without ataxia  Skin: Skin is warm and dry. No rash noted. He is not diaphoretic.  Psychiatric: He has a normal mood and affect.  Nursing note and vitals reviewed.   ED Course  Procedures (including critical care time) Labs Review Labs Reviewed  I-STAT CHEM 8, ED - Abnormal; Notable for the following:    Potassium 3.3 (*)    All other components within normal limits  URINALYSIS, ROUTINE W REFLEX MICROSCOPIC    Imaging Review US Scrotum  07/06/2014   CLINICAL DATA:  Right testicular pain for 4 days with right flank pain.  EXAM: SCROTAL ULTRASOUND  DOPPLER ULTRASOUND OF THE TESTICLES  TECHNIQUE: Complete ultrasound examination of the testicles, epididymis, and other scrotal structures was performed. Color and spectral Doppler ultrasound were also utilized to evaluate blood flow to the testicles.  COMPARISON:  CT abdomen and pelvis 07/06/2014  FINDINGS: Right testicle  Measurements: 4.3 x 1.9 x 3.3 cm. No mass or microlithiasis visualized.  Left testicle  Measurements: 4.5 x 2.3 x 2.8 cm. No mass or microlithiasis visualized.  Right epididymis:  Simple cyst or spermatocele measuring 4 mm.  Left epididymis: Simple cyst or spermatocele measuring 6 mm diameter.  Hydrocele:  None visualized.  Varicocele: Mild increased flow demonstrated in the right side after Valsalva may indicate small varicoceles.  Pulsed Doppler  interrogation of both testes demonstrates normal low resistance arterial and venous waveforms bilaterally. Normal homogeneous flow is demonstrated to both testes on color flow Doppler imaging.  IMPRESSION: Normal appearance of the testes. No evidence of mass or torsion. Possible small varicocele on the right. Small epididymal cysts.   Electronically Signed   By: Burman NievesWilliam  Stevens M.D.   On: 07/06/2014 20:01   [place] Art/ven Flow Abd Pelv Doppler  07/06/2014   CLINICAL DATA:  Right testicular pain for 4 days with right flank pain.  EXAM: SCROTAL ULTRASOUND  DOPPLER ULTRASOUND OF THE TESTICLES  TECHNIQUE: Complete ultrasound examination of the testicles, epididymis, and other scrotal structures was performed. Color and spectral Doppler ultrasound were also utilized to evaluate blood flow to the testicles.  COMPARISON:  CT abdomen and pelvis 07/06/2014  FINDINGS: Right testicle  Measurements: 4.3 x 1.9 x 3.3 cm. No mass or microlithiasis visualized.  Left testicle  Measurements: 4.5 x 2.3 x 2.8 cm. No mass or microlithiasis visualized.  Right epididymis:  Simple cyst or spermatocele measuring 4 mm.  Left epididymis: Simple cyst or spermatocele measuring 6 mm diameter.  Hydrocele:  None visualized.  Varicocele: Mild increased flow demonstrated in the right side after Valsalva may indicate small varicoceles.  Pulsed Doppler interrogation of both testes demonstrates normal low resistance arterial and venous waveforms bilaterally. Normal homogeneous flow is demonstrated to both testes on color flow Doppler imaging.  IMPRESSION: Normal appearance of the testes. No evidence of mass or torsion. Possible small varicocele on the right. Small epididymal cysts.   Electronically Signed   By: Burman NievesWilliam  Stevens M.D.   On: 07/06/2014 20:01   Ct Renal Stone Study  07/06/2014   CLINICAL DATA:  Three-day history of right flank pain  EXAM: CT ABDOMEN AND PELVIS WITHOUT CONTRAST  TECHNIQUE: Multidetector CT imaging of the abdomen and  pelvis was performed following the standard protocol without oral or intravenous contrast material administration.  COMPARISON:  August 29, 2013  FINDINGS: Lung bases are clear. There are foci of calcification in the right coronary artery.  No focal liver lesions are identified on this noncontrast enhanced study. Gallbladder wall is not appreciably thickened. There is no biliary duct dilatation.  Spleen, pancreas, and adrenals appear normal.  On the left, there is no mass or hydronephrosis. There are scattered vascular calcifications but no left renal calculi. There is no left-sided ureteral calculus. On the right, there is a 1.1 x 0.7 cm cyst arising from the posterior mid right kidney. There is no hydronephrosis or calculus in the right kidney. There is no appreciable right ureteral calculus.  In the pelvis, the urinary bladder wall is borderline thickened. The prostate contains several small calculi. There is no pelvic mass or pelvic fluid collection. Appendix is absent.  There is  no bowel obstruction. No free air or portal venous air. There is a rather minimal ventral hernia containing only fat. There are scattered diverticula throughout the colon without diverticulitis.  There is no ascites, adenopathy, or abscess in the abdomen or pelvis. There is atherosclerotic change in the aorta but no aneurysm. There are no blastic or lytic bone lesions.  IMPRESSION: No renal or ureteral calculi. No hydronephrosis. Urinary bladder wall is borderline thickened. There may be mild cystitis. Advise correlation with urinalysis.  No bowel obstruction.  No abscess.  Appendix absent.  Small ventral hernia containing only fat.  Scattered colonic diverticula without diverticulitis. Areas of atherosclerotic calcification in the aorta and iliac arteries as well as coronary artery calcification.   Electronically Signed   By: Bretta Bang III M.D.   On: 07/06/2014 16:32     EKG Interpretation None      MDM   Final  diagnoses:  Right flank pain   Steven [name] presents with right flank pain, similar to previous episodes of kidney stones.  Pt declines narcotic pain control.  Pt is in a hall bed and initial exam was unable to include testicular exam though, pt reports right testicular pain.  Will obtain labs, CT renal study and reassessment.    5:51 PM Testicular exam with TTP of the right testicle and no cremaster reflex on the right; will obtain US scrotum.  CT abd without evidence of stone.  Questionable bladder wall thickening, but UA without evidence of UTI and pt denies penile discharge.    BP 140/86 mmHg  Pulse 81  Temp(Src) 98.5 F (36.9 C) (Oral)  Resp 18  Ht  (1.778 m)  Wt 205 lb (92.987 kg)  BMI 29.41 kg/m2  SpO2 97%   8:42 PM Scrotal ultrasound with normal appearance of the testes. Small varicocele on the right but no evidence of orchitis, epididymitis or testicular torsion. Patient continues to rest comfortably but still complains of mild right flank pain.  Recommended patient follow-up with primary care physician within the next 24 hours. He should also follow-up with urology if symptoms persist.  Unclear etiology of patient's flank pain however there is no evidence of urinary tract infection or pyelonephritis. I do believe that he is safe to return home with close follow-up. Reasons to return to the emergency department were discussed at length.  I have personally reviewed patient's vitals, nursing note and any pertinent labs or imaging.  I performed an undressed physical exam.    It has been determined that no acute conditions requiring further emergency intervention are present at this time. The patient/guardian have been advised of the diagnosis and plan. I reviewed all labs and imaging including any potential incidental findings. We have discussed signs and symptoms that warrant return to the ED and they are listed in the discharge instructions.    Vital signs are stable at  discharge.   BP 147/84 mmHg  Pulse 87  Temp(Src) 98.5 F (36.9 C) (Oral)  Resp 18  Ht  (1.778 m)  Wt 205 lb (92.987 kg)  BMI 29.41 kg/m2  SpO2 98%        Dierdre Forth, PA-C 07/06/14 2044  Dione Booze, MD 07/06/14 2048

## 2014-07-06 NOTE — ED Notes (Signed)
Pt remains in ultrasound.

## 2014-12-21 ENCOUNTER — Emergency Department (HOSPITAL_COMMUNITY): Payer: Self-pay

## 2014-12-21 ENCOUNTER — Encounter (HOSPITAL_COMMUNITY): Payer: Self-pay | Admitting: *Deleted

## 2014-12-21 ENCOUNTER — Emergency Department (HOSPITAL_COMMUNITY)
Admission: EM | Admit: 2014-12-21 | Discharge: 2014-12-21 | Disposition: A | Discharge status: Home / Self Care | Payer: Self-pay | Attending: Emergency Medicine | Admitting: Emergency Medicine

## 2014-12-21 DIAGNOSIS — K0889 Other specified disorders of teeth and supporting structures: Secondary | ICD-10-CM

## 2014-12-21 DIAGNOSIS — Z720 Tobacco use: Secondary | ICD-10-CM | POA: Insufficient documentation

## 2014-12-21 DIAGNOSIS — K088 Other specified disorders of teeth and supporting structures: Secondary | ICD-10-CM | POA: Insufficient documentation

## 2014-12-21 DIAGNOSIS — Z87442 Personal history of urinary calculi: Secondary | ICD-10-CM | POA: Insufficient documentation

## 2014-12-21 DIAGNOSIS — Z951 Presence of aortocoronary bypass graft: Secondary | ICD-10-CM | POA: Insufficient documentation

## 2014-12-21 DIAGNOSIS — Z79899 Other long term (current) drug therapy: Secondary | ICD-10-CM | POA: Insufficient documentation

## 2014-12-21 DIAGNOSIS — E782 Mixed hyperlipidemia: Secondary | ICD-10-CM | POA: Insufficient documentation

## 2014-12-21 DIAGNOSIS — J449 Chronic obstructive pulmonary disease, unspecified: Secondary | ICD-10-CM

## 2014-12-21 DIAGNOSIS — Z7982 Long term (current) use of aspirin: Secondary | ICD-10-CM | POA: Insufficient documentation

## 2014-12-21 DIAGNOSIS — I2510 Atherosclerotic heart disease of native coronary artery without angina pectoris: Secondary | ICD-10-CM | POA: Insufficient documentation

## 2014-12-21 DIAGNOSIS — I10 Essential (primary) hypertension: Secondary | ICD-10-CM | POA: Insufficient documentation

## 2014-12-21 DIAGNOSIS — J441 Chronic obstructive pulmonary disease with (acute) exacerbation: Secondary | ICD-10-CM | POA: Insufficient documentation

## 2014-12-21 LAB — BASIC METABOLIC PANEL
ANION GAP: 9 (ref 5–15)
BUN: 11 mg/dL (ref 6–20)
CALCIUM: 9.9 mg/dL (ref 8.9–10.3)
CHLORIDE: 104 mmol/L (ref 101–111)
CO2: 24 mmol/L (ref 22–32)
CREATININE: 1.25 mg/dL — AB (ref 0.61–1.24)
GFR calc Af Amer: >60 mL/min (ref >60)
GFR calc non Af Amer: >60 mL/min (ref >60)
Glucose, Bld: 106 mg/dL — ABNORMAL HIGH (ref 65–99)
Potassium: 3.8 mmol/L (ref 3.5–5.1)
Sodium: 137 mmol/L (ref 135–145)

## 2014-12-21 LAB — CBC
HEMATOCRIT: 41.1 % (ref 39.0–52.0)
Hemoglobin: 14.2 g/dL (ref 13.0–17.0)
MCH: 32.6 pg (ref 26.0–34.0)
MCHC: 34.5 g/dL (ref 30.0–36.0)
MCV: 94.3 fL (ref 78.0–100.0)
PLATELETS: 305 10*3/uL (ref 150–400)
RBC: 4.36 MIL/uL (ref 4.22–5.81)
RDW: 13.5 % (ref 11.5–15.5)
WBC: 7.4 10*3/uL (ref 4.0–10.5)

## 2014-12-21 MED ORDER — ALBUTEROL SULFATE HFA 108 (90 BASE) MCG/ACT IN AERS
2.0000 | INHALATION_SPRAY | Freq: Once | RESPIRATORY_TRACT | Status: AC
  Administered 2014-12-21: 2 via RESPIRATORY_TRACT
  Filled 2014-12-21: qty 6.7

## 2014-12-21 MED ORDER — HYDROCODONE-ACETAMINOPHEN 5-325 MG PO TABS: 1.0000 | ORAL_TABLET | Freq: Four times a day (QID) | ORAL | Status: DC | PRN

## 2014-12-21 MED ORDER — IBUPROFEN 600 MG PO TABS: 600.0000 mg | ORAL_TABLET | Freq: Four times a day (QID) | ORAL | Status: DC | PRN

## 2014-12-21 MED ORDER — PENICILLIN V POTASSIUM 500 MG PO TABS: 500.0000 mg | ORAL_TABLET | Freq: Four times a day (QID) | ORAL | Status: AC

## 2014-12-21 NOTE — ED Notes (Signed)
PA at bedside.

## 2014-12-21 NOTE — ED Provider Notes (Signed)
History  This chart was scribed for non-physician practitioner, Jaynie Crumble, PA-C,working with Mancel Bale, MD, by Karle Plumber, ED Scribe. This patient was seen in room A02C/A02C and the patient's care was started at 4:18 PM.  Chief Complaint  Patient presents with  . Dental Pain  . COPD   The history is provided by the patient and medical records. No language interpreter was used.    HPI Comments:  Steven [name] is a 44 y.o. obese male who presents to the Emergency Department complaining of severe left upper dental pain that began two days ago. He states he contacted his dentist but could not get an appointment for another 7 days. He states he has taken aspirin with no significant relief of the pain. Eating and talking makes the pain worse. He denies alleviating factors. He denies fever, chills, nausea or vomiting. He states he is in the process of having his teeth extracted a couple at a time to get dentures. Pt also reports some intermittent SOB due to COPD. He reports recently quitting smoking about 4 months ago and states that he has SOB normally at baseline. He reports associated productive cough of white "stringy" material that gets "caught in my teeth". Exertion makes the SOB worse. He denies alleviating factors. He denies CP.  Past Medical History  Diagnosis Date  . Coronary artery disease   . Hypertension   . Hypercholesteremia   . Kidney stone   . Tobacco use disorder   . Hx of CABG   . COPD (chronic obstructive pulmonary disease)    Past Surgical History  Procedure Laterality Date  . Coronary artery bypass graft    . Kidney stone surgery    . Appendectomy    . Lower extremity angiogram N/A 08/15/2012    Procedure: LOWER EXTREMITY ANGIOGRAM with possible PTA /stent;  Surgeon: Corky Crafts, MD;  Location: [facility] CATH LAB;  Service: Cardiovascular;  Laterality: N/A;  . Abdominal angiogram  08/15/2012    Procedure: ABDOMINAL ANGIOGRAM;  Surgeon: Corky Crafts, MD;  Location: [facility] CATH LAB;  Service: Cardiovascular;;  . Cardioversion N/A 08/28/2012    Procedure: Pseudo Compression;  Surgeon: Chuck Hint, MD;  Location: [facility] CATH LAB;  Service: Cardiovascular;  Laterality: N/A;   Family History  Problem Relation Age of Onset  . Heart disease Father   . Hyperlipidemia Father   . Heart disease Maternal Grandmother    History  Substance Use Topics  . Smoking status: Current Every Day Smoker -- 1.00 packs/day for 31 years  . Smokeless tobacco: Former Neurosurgeon    Quit date: 04/27/2013  . Alcohol Use: No     Comment: STATES HE QUIT 06/2013 ATTENDS DAILY AA MEETINGS    Review of Systems  Constitutional: Negative for fever and chills.  HENT: Positive for dental problem.   Respiratory: Positive for cough and shortness of breath.   Cardiovascular: Negative for chest pain.  Gastrointestinal: Negative for nausea and vomiting.  All other systems reviewed and are negative.   Allergies  Review of patient's allergies indicates no known allergies.  Home Medications   Prior to Admission medications   Medication Sig Start Date End Date Taking? Authorizing Provider  aspirin 325 MG tablet Take 325 mg by mouth every 6 (six) hours as needed for moderate pain.   Yes Historical Provider, MD  aspirin 81 MG tablet Take 1 tablet (81 mg total) by mouth daily. Patient not taking: Reported on 07/06/2014 02/25/14   Gaylord Shih,  MD  diazepam (VALIUM) 5 MG tablet Take 1 tablet (5 mg total) by mouth every 6 (six) hours as needed for muscle spasms. Patient not taking: Reported on 07/06/2014 09/07/13   Lorre Nick, MD  HYDROcodone-acetaminophen (NORCO/VICODIN) 5-325 MG per tablet Take 1-2 tablets by mouth every 4 (four) hours as needed for moderate pain or severe pain. Patient not taking: Reported on 07/06/2014 03/23/14   Trixie Dredge, PA-C  ibuprofen (ADVIL,MOTRIN) 800 MG tablet Take 1 tablet (800 mg total) by mouth every 8 (eight) hours as needed for mild pain  or moderate pain. Patient not taking: Reported on 07/06/2014 03/23/14   Trixie Dredge, PA-C  losartan (COZAAR) 50 MG tablet Take 1 tablet (50 mg total) by mouth daily. Patient not taking: Reported on 07/06/2014 02/25/14   Gaylord Shih, MD  oxyCODONE-acetaminophen (PERCOCET) 5-325 MG per tablet Take 1-2 tablets by mouth every 4 (four) hours as needed. 07/06/14   Hannah Muthersbaugh, PA-C  penicillin v potassium (VEETID) 500 MG tablet Take 1 tablet (500 mg total) by mouth 4 (four) times daily. Patient not taking: Reported on 07/06/2014 03/23/14   Trixie Dredge, PA-C  simvastatin (ZOCOR) 40 MG tablet Take 1 tablet (40 mg total) by mouth at bedtime. Patient not taking: Reported on 07/06/2014 02/25/14   Gaylord Shih, MD   Triage Vitals: BP 138/77 mmHg  Pulse 66  Temp(Src) 98.1 F (36.7 C) (Oral)  Resp 18  SpO2 97% Physical Exam  Constitutional: He is oriented to person, place, and time. He appears well-developed and well-nourished.  HENT:  Head: Normocephalic and atraumatic.  Poor dentition, widespread dental decay. Erythema and swelling to the left upper gum. Ttp. No facial swelling. No swelling under the tongue.   Eyes: EOM are normal.  Neck: Normal range of motion. Neck supple.  Cardiovascular: Normal rate, regular rhythm and normal heart sounds.   Pulmonary/Chest: Effort normal and breath sounds normal. No respiratory distress. He has no wheezes. He has no rales.  Musculoskeletal: Normal range of motion. He exhibits no edema.  Neurological: He is alert and oriented to person, place, and time.  Skin: Skin is warm and dry.  Psychiatric: He has a normal mood and affect. His behavior is normal.  Nursing note and vitals reviewed.   ED Course  Procedures (including critical care time) DIAGNOSTIC STUDIES: Oxygen Saturation is 97% on RA, normal by my interpretation.   COORDINATION OF CARE: 4:22 PM- Will order CXR. Will prescribe antibiotics, inhaler and pain medication for dental pain. Pt  verbalizes understanding and agrees to plan.  Medications - No data to display  Labs Review Labs Reviewed  BASIC METABOLIC PANEL - Abnormal; Notable for the following:    Glucose, Bld 106 (*)    Creatinine, Ser 1.25 (*)    All other components within normal limits  CBC    Imaging Review Dg Chest 2 View  12/21/2014   CLINICAL DATA:  Chronic chest pain and cough.  COPD.  EXAM: CHEST  2 VIEW  COMPARISON:  Single view of the chest 09/07/2013. CT chest 08/29/2013.  FINDINGS: The patient is status post CABG. There is some peribronchial thickening. The chest is somewhat hyperexpanded. The lungs are clear. No pneumothorax or pleural effusion. Heart size is normal.  IMPRESSION: COPD without acute disease.   Electronically Signed   By: Drusilla Kanner M.D.   On: 12/21/2014 17:11     EKG Interpretation None      MDM   Final diagnoses:  Pain, dental  Chronic obstructive  pulmonary disease, unspecified COPD, unspecified chronic bronchitis type    Patient emergency department for dental pain, widespread dental decay and exam. Some gum swelling and erythema noted. No obvious abscess. No swelling under the tongue. No evidence of Ludwig's angina. His lungs are clear. Patient's complaining of productive cough and shortness of breath, states "it's chronic." Will treat with a inhaler. Albuterol inhaler provided.   Patient's chest x-ray is negative. Vital signs are normal. Patient will be discharged home with ibuprofen, Norco for the dental pain, amoxicillin for possible early dental infection. We will also given an inhaler for shortness of breath.  Filed Vitals:   12/21/14 1241 12/21/14 1615 12/21/14 1630  BP: 138/77 138/80 120/73  Pulse: 66 69 68  Temp: 98.1 F (36.7 C)    TempSrc: Oral    Resp: 18    SpO2: 97% 98% 99%     I personally performed the services described in this documentation, which was scribed in my presence. The recorded information has been reviewed and is  accurate.    Jaynie Crumble, PA-C 12/21/14 1736  Mancel Bale, MD 12/22/14 [phone]

## 2014-12-21 NOTE — ED Notes (Signed)
Patient transported to X-ray 

## 2014-12-21 NOTE — ED Notes (Signed)
Pt is here left upper dental pain and states that he is having problems breathing and thinks he needs an inhaler

## 2014-12-21 NOTE — Discharge Instructions (Signed)
Penicillin for infection. Ibuprofen for pain. norco for severe pain. Continue inhaler, 2 puffs every 4 hrs. Follow up as needed.   Dental Pain A tooth ache may be caused by cavities (tooth decay). Cavities expose the nerve of the tooth to air and hot or cold temperatures. It may come from an infection or abscess (also called a boil or furuncle) around your tooth. It is also often caused by dental caries (tooth decay). This causes the pain you are having. DIAGNOSIS  Your caregiver can diagnose this problem by exam. TREATMENT   If caused by an infection, it may be treated with medications which kill germs (antibiotics) and pain medications as prescribed by your caregiver. Take medications as directed.  Only take over-the-counter or prescription medicines for pain, discomfort, or fever as directed by your caregiver.  Whether the tooth ache today is caused by infection or dental disease, you should see your dentist as soon as possible for further care. SEEK MEDICAL CARE IF: The exam and treatment you received today has been provided on an emergency basis only. This is not a substitute for complete medical or dental care. If your problem worsens or new problems (symptoms) appear, and you are unable to meet with your dentist, call or return to this location. SEEK IMMEDIATE MEDICAL CARE IF:   You have a fever.  You develop redness and swelling of your face, jaw, or neck.  You are unable to open your mouth.  You have severe pain uncontrolled by pain medicine. MAKE SURE YOU:   Understand these instructions.  Will watch your condition.  Will get help right away if you are not doing well or get worse. Document Released: 05/01/2005 Document Revised: 07/24/2011 Document Reviewed: 12/18/2007 [facility] Patient Information 2015 [place], [place]. This information is not intended to replace advice given to you by your health care provider. Make sure you discuss any questions you have with your health  care provider.  Chronic Obstructive Pulmonary Disease Chronic obstructive pulmonary disease (COPD) is a common lung condition in which airflow from the lungs is limited. COPD is a general term that can be used to describe many different lung problems that limit airflow, including both chronic bronchitis and emphysema. If you have COPD, your lung function will probably never return to normal, but there are measures you can take to improve lung function and make yourself feel better.  CAUSES   Smoking (common).   Exposure to secondhand smoke.   Genetic problems.  Chronic inflammatory lung diseases or recurrent infections. SYMPTOMS   Shortness of breath, especially with physical activity.   Deep, persistent (chronic) cough with a large amount of thick mucus.   Wheezing.   Rapid breaths (tachypnea).   Gray or bluish discoloration (cyanosis) of the skin, especially in fingers, toes, or lips.   Fatigue.   Weight loss.   Frequent infections or episodes when breathing symptoms become much worse (exacerbations).   Chest tightness. DIAGNOSIS  Your health care provider will take a medical history and perform a physical examination to make the initial diagnosis. Additional tests for COPD may include:   Lung (pulmonary) function tests.  Chest X-ray.  CT scan.  Blood tests. TREATMENT  Treatment available to help you feel better when you have COPD includes:   Inhaler and nebulizer medicines. These help manage the symptoms of COPD and make your breathing more comfortable.  Supplemental oxygen. Supplemental oxygen is only helpful if you have a low oxygen level in your blood.   Exercise and  physical activity. These are beneficial for nearly all people with COPD. Some people may also benefit from a pulmonary rehabilitation program. HOME CARE INSTRUCTIONS   Take all medicines (inhaled or pills) as directed by your health care provider.  Avoid over-the-counter medicines  or cough syrups that dry up your airway (such as antihistamines) and slow down the elimination of secretions unless instructed otherwise by your health care provider.   If you are a smoker, the most important thing that you can do is stop smoking. Continuing to smoke will cause further lung damage and breathing trouble. Ask your health care provider for help with quitting smoking. He or she can direct you to community resources or hospitals that provide support.  Avoid exposure to irritants such as smoke, chemicals, and fumes that aggravate your breathing.  Use oxygen therapy and pulmonary rehabilitation if directed by your health care provider. If you require home oxygen therapy, ask your health care provider whether you should purchase a pulse oximeter to measure your oxygen level at home.   Avoid contact with individuals who have a contagious illness.  Avoid extreme temperature and humidity changes.  Eat healthy foods. Eating smaller, more frequent meals and resting before meals may help you maintain your strength.  Stay active, but balance activity with periods of rest. Exercise and physical activity will help you maintain your ability to do things you want to do.  Preventing infection and hospitalization is very important when you have COPD. Make sure to receive all the vaccines your health care provider recommends, especially the pneumococcal and influenza vaccines. Ask your health care provider whether you need a pneumonia vaccine.  Learn and use relaxation techniques to manage stress.  Learn and use controlled breathing techniques as directed by your health care provider. Controlled breathing techniques include:   Pursed lip breathing. Start by breathing in (inhaling) through your nose for 1 second. Then, purse your lips as if you were going to whistle and breathe out (exhale) through the pursed lips for 2 seconds.   Diaphragmatic breathing. Start by putting one hand on your  abdomen just above your waist. Inhale slowly through your nose. The hand on your abdomen should move out. Then purse your lips and exhale slowly. You should be able to feel the hand on your abdomen moving in as you exhale.   Learn and use controlled coughing to clear mucus from your lungs. Controlled coughing is a series of short, progressive coughs. The steps of controlled coughing are:  1. Lean your head slightly forward.  2. Breathe in deeply using diaphragmatic breathing.  3. Try to hold your breath for 3 seconds.  4. Keep your mouth slightly open while coughing twice.  5. Spit any mucus out into a tissue.  6. Rest and repeat the steps once or twice as needed. SEEK MEDICAL CARE IF:   You are coughing up more mucus than usual.   There is a change in the color or thickness of your mucus.   Your breathing is more labored than usual.   Your breathing is faster than usual.  SEEK IMMEDIATE MEDICAL CARE IF:   You have shortness of breath while you are resting.   You have shortness of breath that prevents you from:  Being able to talk.   Performing your usual physical activities.   You have chest pain lasting longer than 5 minutes.   Your skin color is more cyanotic than usual.  You measure low oxygen saturations for longer than 5  minutes with a pulse oximeter. MAKE SURE YOU:   Understand these instructions.  Will watch your condition.  Will get help right away if you are not doing well or get worse. Document Released: 02/08/2005 Document Revised: 09/15/2013 Document Reviewed: 12/26/2012 [facility] Patient Information 2015 [place], [place]. This information is not intended to replace advice given to you by your health care provider. Make sure you discuss any questions you have with your health care provider.

## 2014-12-29 ENCOUNTER — Encounter (HOSPITAL_COMMUNITY): Payer: Self-pay | Admitting: *Deleted

## 2014-12-29 ENCOUNTER — Emergency Department (HOSPITAL_COMMUNITY)
Admission: EM | Admit: 2014-12-29 | Discharge: 2014-12-29 | Disposition: A | Discharge status: Home / Self Care | Payer: Self-pay | Attending: Emergency Medicine | Admitting: Emergency Medicine

## 2014-12-29 ENCOUNTER — Emergency Department (HOSPITAL_COMMUNITY): Payer: Self-pay

## 2014-12-29 DIAGNOSIS — N23 Unspecified renal colic: Secondary | ICD-10-CM | POA: Insufficient documentation

## 2014-12-29 DIAGNOSIS — Z951 Presence of aortocoronary bypass graft: Secondary | ICD-10-CM | POA: Insufficient documentation

## 2014-12-29 DIAGNOSIS — I10 Essential (primary) hypertension: Secondary | ICD-10-CM | POA: Insufficient documentation

## 2014-12-29 DIAGNOSIS — J449 Chronic obstructive pulmonary disease, unspecified: Secondary | ICD-10-CM | POA: Insufficient documentation

## 2014-12-29 DIAGNOSIS — Z9889 Other specified postprocedural states: Secondary | ICD-10-CM | POA: Insufficient documentation

## 2014-12-29 DIAGNOSIS — R109 Unspecified abdominal pain: Secondary | ICD-10-CM

## 2014-12-29 DIAGNOSIS — Z720 Tobacco use: Secondary | ICD-10-CM | POA: Insufficient documentation

## 2014-12-29 DIAGNOSIS — N508 Other specified disorders of male genital organs: Secondary | ICD-10-CM | POA: Insufficient documentation

## 2014-12-29 DIAGNOSIS — I2510 Atherosclerotic heart disease of native coronary artery without angina pectoris: Secondary | ICD-10-CM | POA: Insufficient documentation

## 2014-12-29 DIAGNOSIS — E780 Pure hypercholesterolemia: Secondary | ICD-10-CM | POA: Insufficient documentation

## 2014-12-29 LAB — CBC
HCT: 43 % (ref 39.0–52.0)
HEMOGLOBIN: 14.7 g/dL (ref 13.0–17.0)
MCH: 32.6 pg (ref 26.0–34.0)
MCHC: 34.2 g/dL (ref 30.0–36.0)
MCV: 95.3 fL (ref 78.0–100.0)
Platelets: 289 10*3/uL (ref 150–400)
RBC: 4.51 MIL/uL (ref 4.22–5.81)
RDW: 13.7 % (ref 11.5–15.5)
WBC: 5.6 10*3/uL (ref 4.0–10.5)

## 2014-12-29 LAB — COMPREHENSIVE METABOLIC PANEL
ALK PHOS: 80 U/L (ref 38–126)
ALT: 25 U/L (ref 17–63)
ANION GAP: 8 (ref 5–15)
AST: 29 U/L (ref 15–41)
Albumin: 3.9 g/dL (ref 3.5–5.0)
BUN: 13 mg/dL (ref 6–20)
CALCIUM: 9.8 mg/dL (ref 8.9–10.3)
CO2: 25 mmol/L (ref 22–32)
Chloride: 107 mmol/L (ref 101–111)
Creatinine, Ser: 1.16 mg/dL (ref 0.61–1.24)
GFR calc non Af Amer: >60 mL/min (ref >60)
Glucose, Bld: 113 mg/dL — ABNORMAL HIGH (ref 65–99)
Potassium: 4 mmol/L (ref 3.5–5.1)
SODIUM: 140 mmol/L (ref 135–145)
TOTAL PROTEIN: 7.8 g/dL (ref 6.5–8.1)
Total Bilirubin: 0.5 mg/dL (ref 0.3–1.2)

## 2014-12-29 LAB — URINALYSIS, ROUTINE W REFLEX MICROSCOPIC
Bilirubin Urine: NEGATIVE
Glucose, UA: NEGATIVE mg/dL
Hgb urine dipstick: NEGATIVE
Ketones, ur: NEGATIVE mg/dL
Nitrite: NEGATIVE
PROTEIN: NEGATIVE mg/dL
Specific Gravity, Urine: 1.013 (ref 1.005–1.030)
UROBILINOGEN UA: 0.2 mg/dL (ref 0.0–1.0)
pH: 6.5 (ref 5.0–8.0)

## 2014-12-29 LAB — LIPASE, BLOOD: Lipase: 29 U/L (ref 22–51)

## 2014-12-29 LAB — URINE MICROSCOPIC-ADD ON

## 2014-12-29 MED ORDER — ONDANSETRON HCL 4 MG/2ML IJ SOLN: 4.0000 mg | Freq: Once | INTRAMUSCULAR | Status: DC | PRN

## 2014-12-29 MED ORDER — IBUPROFEN 600 MG PO TABS: 600.0000 mg | ORAL_TABLET | Freq: Four times a day (QID) | ORAL | Status: DC | PRN

## 2014-12-29 MED ORDER — ONDANSETRON 8 MG PO TBDP: 8.0000 mg | ORAL_TABLET | Freq: Three times a day (TID) | ORAL | Status: DC | PRN

## 2014-12-29 MED ORDER — TAMSULOSIN HCL 0.4 MG PO CAPS: 0.4000 mg | ORAL_CAPSULE | Freq: Every day | ORAL | Status: DC

## 2014-12-29 MED ORDER — KETOROLAC TROMETHAMINE 30 MG/ML IJ SOLN
30.0000 mg | Freq: Once | INTRAMUSCULAR | Status: AC
  Administered 2014-12-29: 30 mg via INTRAVENOUS
  Filled 2014-12-29: qty 1

## 2014-12-29 MED ORDER — HYDROCODONE-ACETAMINOPHEN 7.5-325 MG PO TABS: 1.0000 | ORAL_TABLET | Freq: Four times a day (QID) | ORAL | Status: DC | PRN

## 2014-12-29 MED ORDER — HYDROMORPHONE HCL 1 MG/ML IJ SOLN
1.0000 mg | Freq: Once | INTRAMUSCULAR | Status: AC
  Administered 2014-12-29: 1 mg via INTRAVENOUS
  Filled 2014-12-29: qty 1

## 2014-12-29 MED ORDER — ONDANSETRON HCL 4 MG/2ML IJ SOLN
4.0000 mg | Freq: Once | INTRAMUSCULAR | Status: AC
  Administered 2014-12-29: 4 mg via INTRAVENOUS
  Filled 2014-12-29: qty 2

## 2014-12-29 NOTE — ED Notes (Signed)
Awake. Verbally responsive. A/O x4. Resp even and unlabored. No audible adventitious breath sounds noted. ABC's intact.  

## 2014-12-29 NOTE — ED Notes (Signed)
Patient transported to CT 

## 2014-12-29 NOTE — Discharge Instructions (Signed)
We saw you in the ER for the abdominal pain. °Our results indicate that you have a kidney stone. °We were able to get your pain is relative control, and we can safely send you home. ° °Take the meds prescribed. °Set up an appointment with the Urologist. °If the pain is unbearable, you start having fevers, chills, and are unable to keep any meds down - then return to the ER. ° ° ° °Kidney Stones °Kidney stones (urolithiasis) are deposits that form inside your kidneys. The intense pain is caused by the stone moving through the urinary tract. When the stone moves, the ureter goes into spasm around the stone. The stone is usually passed in the urine.  °CAUSES  °· A disorder that makes certain neck glands produce too much parathyroid hormone (primary hyperparathyroidism). °· A buildup of uric acid crystals, similar to gout in your joints. °· Narrowing (stricture) of the ureter. °· A kidney obstruction present at birth (congenital obstruction). °· Previous surgery on the kidney or ureters. °· Numerous kidney infections. °SYMPTOMS  °· Feeling sick to your stomach (nauseous). °· Throwing up (vomiting). °· Blood in the urine (hematuria). °· Pain that usually spreads (radiates) to the groin. °· Frequency or urgency of urination. °DIAGNOSIS  °· Taking a history and physical exam. °· Blood or urine tests. °· CT scan. °· Occasionally, an examination of the inside of the urinary bladder (cystoscopy) is performed. °TREATMENT  °· Observation. °· Increasing your fluid intake. °· Extracorporeal shock wave lithotripsy--This is a noninvasive procedure that uses shock waves to break up kidney stones. °· Surgery may be needed if you have severe pain or persistent obstruction. There are various surgical procedures. Most of the procedures are performed with the use of small instruments. Only small incisions are needed to accommodate these instruments, so recovery time is minimized. °The size, location, and chemical composition are all  important variables that will determine the proper choice of action for you. Talk to your health care provider to better understand your situation so that you will minimize the risk of injury to yourself and your kidney.  °HOME CARE INSTRUCTIONS  °· Drink enough water and fluids to keep your urine clear or pale yellow. This will help you to pass the stone or stone fragments. °· Strain all urine through the provided strainer. Keep all particulate matter and stones for your health care provider to see. The stone causing the pain may be as small as a grain of salt. It is very important to use the strainer each and every time you pass your urine. The collection of your stone will allow your health care provider to analyze it and verify that a stone has actually passed. The stone analysis will often identify what you can do to reduce the incidence of recurrences. °· Only take over-the-counter or prescription medicines for pain, discomfort, or fever as directed by your health care provider. °· Make a follow-up appointment with your health care provider as directed. °· Get follow-up X-rays if required. The absence of pain does not always mean that the stone has passed. It may have only stopped moving. If the urine remains completely obstructed, it can cause loss of kidney function or even complete destruction of the kidney. It is your responsibility to make sure X-rays and follow-ups are completed. Ultrasounds of the kidney can show blockages and the status of the kidney. Ultrasounds are not associated with any radiation and can be performed easily in a matter of minutes. °SEEK MEDICAL CARE   IF: °· You experience pain that is progressive and unresponsive to any pain medicine you have been prescribed. °SEEK IMMEDIATE MEDICAL CARE IF:  °· Pain cannot be controlled with the prescribed medicine. °· You have a fever or shaking chills. °· The severity or intensity of pain increases over 18 hours and is not relieved by pain  medicine. °· You develop a new onset of abdominal pain. °· You feel faint or pass out. °· You are unable to urinate. °MAKE SURE YOU:  °· Understand these instructions. °· Will watch your condition. °· Will get help right away if you are not doing well or get worse. °Document Released: 05/01/2005 Document Revised: 01/01/2013 Document Reviewed: 10/02/2012 °[employer]® Patient Information ©2015 [employer]. This information is not intended to replace advice given to you by your health care provider. Make sure you discuss any questions you have with your health care provider. ° °

## 2014-12-29 NOTE — ED Notes (Signed)
Pt states that he woke up around 4am this am with rt flank pain, nausea and testicle pain; pt states that it hurts to take a deep breathe from the pain; pt states that he now has dry heaves

## 2014-12-29 NOTE — ED Provider Notes (Signed)
CSN: [id number]     Arrival date & time 12/29/14  0516 History   First MD Initiated Contact with Patient 12/29/14 [phone]     Chief Complaint  Patient presents with  . Flank Pain  . Nausea     (Consider location/radiation/quality/duration/timing/severity/associated sxs/prior Treatment) HPI Comments: Pt comes in with R sided flank pain. Pain is sudden onset at 4 am. Pain is in the flank region and the testicle. + nausea and emesis. No fevers, chills. Mo uti like sx. + hx of kidney stones - and the pain is similar. No trauma. Pt has no chest pain, dib. Hx of appendectomy.   ROS 10 Systems reviewed and are negative for acute change except as noted in the HPI.     Patient is a 44 y.o. male presenting with flank pain. The history is provided by the patient.  Flank Pain    Past Medical History  Diagnosis Date  . Coronary artery disease   . Hypertension   . Hypercholesteremia   . Kidney stone   . Tobacco use disorder   . Hx of CABG   . COPD (chronic obstructive pulmonary disease)    Past Surgical History  Procedure Laterality Date  . Coronary artery bypass graft    . Kidney stone surgery    . Appendectomy    . Lower extremity angiogram N/A 08/15/2012    Procedure: LOWER EXTREMITY ANGIOGRAM with possible PTA /stent;  Surgeon: Corky Crafts, MD;  Location: [facility] CATH LAB;  Service: Cardiovascular;  Laterality: N/A;  . Abdominal angiogram  08/15/2012    Procedure: ABDOMINAL ANGIOGRAM;  Surgeon: Corky Crafts, MD;  Location: [facility] CATH LAB;  Service: Cardiovascular;;  . Cardioversion N/A 08/28/2012    Procedure: Pseudo Compression;  Surgeon: Chuck Hint, MD;  Location: [facility] CATH LAB;  Service: Cardiovascular;  Laterality: N/A;   Family History  Problem Relation Age of Onset  . Heart disease Father   . Hyperlipidemia Father   . Heart disease Maternal Grandmother    Social History  Substance Use Topics  . Smoking status: Current Every Day Smoker -- 1.00 packs/day for  31 years  . Smokeless tobacco: Former Neurosurgeon    Quit date: 04/27/2013  . Alcohol Use: No     Comment: STATES HE QUIT 06/2013 ATTENDS DAILY AA MEETINGS    Review of Systems  Genitourinary: Positive for flank pain and testicular pain.  All other systems reviewed and are negative.     Allergies  Review of patient's allergies indicates no known allergies.  Home Medications   Prior to Admission medications   Medication Sig Start Date End Date Taking? Authorizing Provider  aspirin 325 MG tablet Take 325 mg by mouth every 6 (six) hours as needed for moderate pain.   Yes Historical Provider, MD  aspirin 81 MG tablet Take 1 tablet (81 mg total) by mouth daily. Patient not taking: Reported on 07/06/2014 02/25/14   Gaylord Shih, MD  diazepam (VALIUM) 5 MG tablet Take 1 tablet (5 mg total) by mouth every 6 (six) hours as needed for muscle spasms. Patient not taking: Reported on 07/06/2014 09/07/13   Lorre Nick, MD  HYDROcodone-acetaminophen [facility]) 7.5-325 MG per tablet Take 1 tablet by mouth every 6 (six) hours as needed for moderate pain. 12/29/14   Derwood Kaplan, MD  ibuprofen (ADVIL,MOTRIN) 600 MG tablet Take 1 tablet (600 mg total) by mouth every 6 (six) hours as needed. 12/29/14   Derwood Kaplan, MD  losartan (COZAAR) 50 MG tablet  Take 1 tablet (50 mg total) by mouth daily. Patient not taking: Reported on 07/06/2014 02/25/14   Gaylord Shih, MD  ondansetron (ZOFRAN ODT) 8 MG disintegrating tablet Take 1 tablet (8 mg total) by mouth every 8 (eight) hours as needed for nausea. 12/29/14   Derwood Kaplan, MD  oxyCODONE-acetaminophen (PERCOCET) 5-325 MG per tablet Take 1-2 tablets by mouth every 4 (four) hours as needed. Patient not taking: Reported on 12/29/2014 07/06/14   Dahlia Client Muthersbaugh, PA-C  penicillin v potassium (VEETID) 500 MG tablet Take 1 tablet (500 mg total) by mouth 4 (four) times daily. Patient not taking: Reported on 07/06/2014 03/23/14   Trixie Dredge, PA-C  simvastatin (ZOCOR) 40 MG  tablet Take 1 tablet (40 mg total) by mouth at bedtime. Patient not taking: Reported on 07/06/2014 02/25/14   Gaylord Shih, MD  tamsulosin (FLOMAX) 0.4 MG CAPS capsule Take 1 capsule (0.4 mg total) by mouth daily. 12/29/14   Shenandoah Vandergriff, MD   BP 151/86 mmHg  Pulse 82  Temp(Src) 97.6 F (36.4 C) (Oral)  Resp 20  SpO2 95% Physical Exam  Constitutional: He is oriented to person, place, and time. He appears well-developed.  HENT:  Head: Normocephalic and atraumatic.  Eyes: Conjunctivae and EOM are normal. Pupils are equal, round, and reactive to light.  Neck: Normal range of motion. Neck supple.  Cardiovascular: Normal rate and regular rhythm.   Pulmonary/Chest: Effort normal and breath sounds normal.  Abdominal: Soft. Bowel sounds are normal. He exhibits no distension. There is no tenderness. There is no rebound and no guarding.  Genitourinary: Penis normal.  Testes free moving  Neurological: He is alert and oriented to person, place, and time.  Skin: Skin is warm.  Nursing note and vitals reviewed.   ED Course  Procedures (including critical care time) Labs Review Labs Reviewed  COMPREHENSIVE METABOLIC PANEL - Abnormal; Notable for the following:    Glucose, Bld 113 (*)    All other components within normal limits  URINALYSIS, ROUTINE W REFLEX MICROSCOPIC (NOT AT [facility]) - Abnormal; Notable for the following:    APPearance CLOUDY (*)    Leukocytes, UA TRACE (*)    All other components within normal limits  LIPASE, BLOOD  CBC  URINE MICROSCOPIC-ADD ON    Imaging Review Ct Renal Stone Study  12/29/2014   CLINICAL DATA:  Woke up with right flank pain.  EXAM: CT ABDOMEN AND PELVIS WITHOUT CONTRAST  TECHNIQUE: Multidetector CT imaging of the abdomen and pelvis was performed following the standard protocol without IV contrast.  COMPARISON:  07/06/2014 and 09/23/2008  FINDINGS: Airspace densities in the posterior right lower lobe are concerning for infection. No pleural effusions.  Stable 3 mm nodular density at the left lung base on sequence 4, image 7. This has been present since 2010 and compatible with a benign finding. Patient had a median sternotomy. Negative for free air.  Unenhanced CT was performed per clinician order. Lack of IV contrast limits sensitivity and specificity, especially for evaluation of abdominal/pelvic solid viscera.  No acute abnormality to the liver, gallbladder or spleen. Normal appearance of the pancreas and adrenal glands. Vascular calcifications associated with the left kidney. Question punctate stone in the left kidney interpolar region. Moderate right hydronephrosis. Mild dilatation of the proximal right ureter. There is a 3 mm stone in the proximal right ureter. Normal appearance of the urinary bladder.  A small exophytic hyperdense structure along the anterior right kidney on sequence 2, image 22. This most likely represents  a hyperdense or proteinaceous cyst based on previous imaging. There appears to be additional exophytic cysts along the posterior right kidney.  Atherosclerotic calcifications in the aorta and iliac arteries without aneurysm. No significant free fluid or lymphadenopathy. No gross abnormality to the prostate or seminal vesicles. No acute abnormality to the small bowel or large bowel. No acute bone abnormality.  IMPRESSION: Moderate right hydronephrosis due to a 3 mm stone in the proximal right ureter.  Question a nonobstructive left kidney stone.  Parenchymal densities in the posterior right lower lobe. Findings could represent a small focus of pneumonia versus atelectasis.   Electronically Signed   By: Richarda Overlie M.D.   On: 12/29/2014 07:54   I, Rhunette Croft, Natara Monfort, personally reviewed and evaluated these images and lab results as part of my medical decision-making.   EKG Interpretation None      MDM   Final diagnoses:  Acute right flank pain  Ureteral colic    Pt with acute flank pain. Has a kidney stone. Pain is relatively  well controlled -will d/c with norco. Pt reports needing 10 mg norco for severe pain - i have given him 7.5 mg, as i dont see hx of chronic pain with 10 mg norco. Will d/c with strict return precautions in place.   Derwood Kaplan, MD 12/29/14 [phone]

## 2014-12-29 NOTE — ED Notes (Signed)
Awake. Verbally responsive. A/O x4. Resp even and unlabored. No audible adventitious breath sounds noted. ABC's intact. IV saline lock patent and intact. 

## 2014-12-31 ENCOUNTER — Other Ambulatory Visit: Payer: Self-pay | Admitting: Urology

## 2014-12-31 ENCOUNTER — Encounter (HOSPITAL_COMMUNITY): Payer: Self-pay

## 2014-12-31 ENCOUNTER — Emergency Department (HOSPITAL_COMMUNITY): Payer: Self-pay | Admitting: Anesthesiology

## 2014-12-31 ENCOUNTER — Emergency Department (HOSPITAL_COMMUNITY): Payer: Self-pay

## 2014-12-31 ENCOUNTER — Encounter (HOSPITAL_COMMUNITY)
Admission: EM | Disposition: A | Discharge status: Home / Self Care | Payer: Self-pay | Source: Home / Self Care | Attending: Emergency Medicine

## 2014-12-31 ENCOUNTER — Ambulatory Visit (HOSPITAL_COMMUNITY)
Admission: EM | Admit: 2014-12-31 | Discharge: 2015-01-01 | Disposition: A | Discharge status: Home / Self Care | Payer: Self-pay | Attending: Urology | Admitting: Urology

## 2014-12-31 DIAGNOSIS — N201 Calculus of ureter: Secondary | ICD-10-CM | POA: Diagnosis present

## 2014-12-31 DIAGNOSIS — J449 Chronic obstructive pulmonary disease, unspecified: Secondary | ICD-10-CM | POA: Insufficient documentation

## 2014-12-31 DIAGNOSIS — R52 Pain, unspecified: Secondary | ICD-10-CM

## 2014-12-31 DIAGNOSIS — I2510 Atherosclerotic heart disease of native coronary artery without angina pectoris: Secondary | ICD-10-CM | POA: Insufficient documentation

## 2014-12-31 DIAGNOSIS — E780 Pure hypercholesterolemia: Secondary | ICD-10-CM | POA: Insufficient documentation

## 2014-12-31 DIAGNOSIS — Z951 Presence of aortocoronary bypass graft: Secondary | ICD-10-CM | POA: Insufficient documentation

## 2014-12-31 DIAGNOSIS — I10 Essential (primary) hypertension: Secondary | ICD-10-CM | POA: Insufficient documentation

## 2014-12-31 DIAGNOSIS — Z87442 Personal history of urinary calculi: Secondary | ICD-10-CM | POA: Insufficient documentation

## 2014-12-31 DIAGNOSIS — N200 Calculus of kidney: Secondary | ICD-10-CM

## 2014-12-31 DIAGNOSIS — Z79899 Other long term (current) drug therapy: Secondary | ICD-10-CM | POA: Insufficient documentation

## 2014-12-31 DIAGNOSIS — Z7982 Long term (current) use of aspirin: Secondary | ICD-10-CM | POA: Insufficient documentation

## 2014-12-31 DIAGNOSIS — F17200 Nicotine dependence, unspecified, uncomplicated: Secondary | ICD-10-CM | POA: Insufficient documentation

## 2014-12-31 DIAGNOSIS — Z791 Long term (current) use of non-steroidal anti-inflammatories (NSAID): Secondary | ICD-10-CM | POA: Insufficient documentation

## 2014-12-31 DIAGNOSIS — N132 Hydronephrosis with renal and ureteral calculous obstruction: Secondary | ICD-10-CM | POA: Insufficient documentation

## 2014-12-31 HISTORY — PX: CYSTOSCOPY WITH RETROGRADE PYELOGRAM, URETEROSCOPY AND STENT PLACEMENT: SHX5789

## 2014-12-31 LAB — CBC WITH DIFFERENTIAL/PLATELET
BASOS ABS: 0 10*3/uL (ref 0.0–0.1)
Basophils Relative: 1 % (ref 0–1)
EOS ABS: 0.3 10*3/uL (ref 0.0–0.7)
EOS PCT: 4 % (ref 0–5)
HCT: 41.7 % (ref 39.0–52.0)
Hemoglobin: 14 g/dL (ref 13.0–17.0)
Lymphocytes Relative: 17 % (ref 12–46)
Lymphs Abs: 1.1 10*3/uL (ref 0.7–4.0)
MCH: 32 pg (ref 26.0–34.0)
MCHC: 33.6 g/dL (ref 30.0–36.0)
MCV: 95.2 fL (ref 78.0–100.0)
Monocytes Absolute: 0.6 10*3/uL (ref 0.1–1.0)
Monocytes Relative: 9 % (ref 3–12)
Neutro Abs: 4.5 10*3/uL (ref 1.7–7.7)
Neutrophils Relative %: 69 % (ref 43–77)
Platelets: 268 10*3/uL (ref 150–400)
RBC: 4.38 MIL/uL (ref 4.22–5.81)
RDW: 13.7 % (ref 11.5–15.5)
WBC: 6.5 10*3/uL (ref 4.0–10.5)

## 2014-12-31 LAB — BASIC METABOLIC PANEL
Anion gap: 8 (ref 5–15)
BUN: 19 mg/dL (ref 6–20)
CO2: 26 mmol/L (ref 22–32)
CREATININE: 0.97 mg/dL (ref 0.61–1.24)
Calcium: 9 mg/dL (ref 8.9–10.3)
Chloride: 104 mmol/L (ref 101–111)
GFR calc Af Amer: >60 mL/min (ref >60)
Glucose, Bld: 110 mg/dL — ABNORMAL HIGH (ref 65–99)
Potassium: 3.8 mmol/L (ref 3.5–5.1)
SODIUM: 138 mmol/L (ref 135–145)

## 2014-12-31 LAB — URINALYSIS, ROUTINE W REFLEX MICROSCOPIC
Bilirubin Urine: NEGATIVE
GLUCOSE, UA: NEGATIVE mg/dL
Hgb urine dipstick: NEGATIVE
KETONES UR: NEGATIVE mg/dL
LEUKOCYTES UA: NEGATIVE
NITRITE: NEGATIVE
PROTEIN: NEGATIVE mg/dL
Specific Gravity, Urine: 1.011 (ref 1.005–1.030)
Urobilinogen, UA: 0.2 mg/dL (ref 0.0–1.0)
pH: 6 (ref 5.0–8.0)

## 2014-12-31 SURGERY — CYSTOURETEROSCOPY, WITH RETROGRADE PYELOGRAM AND STENT INSERTION
Anesthesia: General | Laterality: Right

## 2014-12-31 MED ORDER — ONDANSETRON HCL 4 MG/2ML IJ SOLN
INTRAMUSCULAR | Status: AC
  Filled 2014-12-31: qty 2

## 2014-12-31 MED ORDER — FENTANYL CITRATE (PF) 250 MCG/5ML IJ SOLN
INTRAMUSCULAR | Status: AC
  Filled 2014-12-31: qty 25

## 2014-12-31 MED ORDER — TAMSULOSIN HCL 0.4 MG PO CAPS
0.4000 mg | ORAL_CAPSULE | Freq: Once | ORAL | Status: AC
  Administered 2014-12-31: 0.4 mg via ORAL
  Filled 2014-12-31: qty 1

## 2014-12-31 MED ORDER — LABETALOL HCL 5 MG/ML IV SOLN
INTRAVENOUS | Status: AC
  Filled 2014-12-31: qty 4

## 2014-12-31 MED ORDER — KETOROLAC TROMETHAMINE 30 MG/ML IJ SOLN
30.0000 mg | Freq: Once | INTRAMUSCULAR | Status: AC
  Administered 2014-12-31: 30 mg via INTRAVENOUS
  Filled 2014-12-31: qty 1

## 2014-12-31 MED ORDER — DIPHENHYDRAMINE HCL 12.5 MG/5ML PO ELIX: 12.5000 mg | ORAL_SOLUTION | Freq: Four times a day (QID) | ORAL | Status: DC | PRN

## 2014-12-31 MED ORDER — SODIUM CHLORIDE 0.9 % IV SOLN
INTRAVENOUS | Status: DC
  Administered 2014-12-31 – 2015-01-01 (×2): via INTRAVENOUS

## 2014-12-31 MED ORDER — ONDANSETRON HCL 4 MG/2ML IJ SOLN
4.0000 mg | Freq: Once | INTRAMUSCULAR | Status: AC
  Administered 2014-12-31: 4 mg via INTRAVENOUS
  Filled 2014-12-31: qty 2

## 2014-12-31 MED ORDER — CEFAZOLIN SODIUM-DEXTROSE 2-3 GM-% IV SOLR
INTRAVENOUS | Status: AC
  Filled 2014-12-31: qty 50

## 2014-12-31 MED ORDER — EPHEDRINE SULFATE 50 MG/ML IJ SOLN
INTRAMUSCULAR | Status: DC | PRN
  Administered 2014-12-31: 5 mg via INTRAVENOUS

## 2014-12-31 MED ORDER — PROPOFOL 10 MG/ML IV BOLUS
INTRAVENOUS | Status: DC | PRN
  Administered 2014-12-31: 200 mg via INTRAVENOUS

## 2014-12-31 MED ORDER — FENTANYL CITRATE (PF) 100 MCG/2ML IJ SOLN
INTRAMUSCULAR | Status: DC | PRN
  Administered 2014-12-31: 50 ug via INTRAVENOUS
  Administered 2014-12-31: 100 ug via INTRAVENOUS

## 2014-12-31 MED ORDER — HYDROMORPHONE HCL 1 MG/ML IJ SOLN
1.0000 mg | Freq: Once | INTRAMUSCULAR | Status: AC
  Administered 2014-12-31: 1 mg via INTRAVENOUS
  Filled 2014-12-31: qty 1

## 2014-12-31 MED ORDER — ONDANSETRON HCL 4 MG/2ML IJ SOLN
4.0000 mg | INTRAMUSCULAR | Status: DC | PRN
  Administered 2014-12-31 – 2015-01-01 (×2): 4 mg via INTRAVENOUS
  Filled 2014-12-31 (×2): qty 2

## 2014-12-31 MED ORDER — FENTANYL CITRATE (PF) 100 MCG/2ML IJ SOLN
25.0000 ug | INTRAMUSCULAR | Status: DC | PRN
  Administered 2014-12-31: 25 ug via INTRAVENOUS

## 2014-12-31 MED ORDER — HYDROMORPHONE HCL 1 MG/ML IJ SOLN: 1.0000 mg | Freq: Once | INTRAMUSCULAR | Status: DC

## 2014-12-31 MED ORDER — SODIUM CHLORIDE 0.9 % IR SOLN
Status: DC | PRN
  Administered 2014-12-31: 1000 mL

## 2014-12-31 MED ORDER — ACETAMINOPHEN 325 MG PO TABS: 650.0000 mg | ORAL_TABLET | ORAL | Status: DC | PRN

## 2014-12-31 MED ORDER — HYDROMORPHONE HCL 2 MG/ML IJ SOLN
2.0000 mg | Freq: Once | INTRAMUSCULAR | Status: AC
  Administered 2014-12-31: 2 mg via INTRAVENOUS
  Filled 2014-12-31: qty 1

## 2014-12-31 MED ORDER — GABAPENTIN 100 MG PO CAPS
100.0000 mg | ORAL_CAPSULE | Freq: Three times a day (TID) | ORAL | Status: DC
  Administered 2014-12-31 – 2015-01-01 (×2): 100 mg via ORAL
  Filled 2014-12-31 (×2): qty 1

## 2014-12-31 MED ORDER — CEFAZOLIN SODIUM-DEXTROSE 2-3 GM-% IV SOLR
2.0000 g | INTRAVENOUS | Status: AC
  Administered 2014-12-31: 2 g via INTRAVENOUS

## 2014-12-31 MED ORDER — SODIUM CHLORIDE 0.9 % IV SOLN
1000.0000 mL | Freq: Once | INTRAVENOUS | Status: AC
  Administered 2014-12-31: 1000 mL via INTRAVENOUS

## 2014-12-31 MED ORDER — LIDOCAINE HCL (CARDIAC) 20 MG/ML IV SOLN
INTRAVENOUS | Status: DC | PRN
  Administered 2014-12-31: 100 mg via INTRAVENOUS
  Administered 2014-12-31: 25 mg via INTRATRACHEAL

## 2014-12-31 MED ORDER — SODIUM CHLORIDE 0.9 % IR SOLN
Status: DC | PRN
  Administered 2014-12-31: 3000 mL

## 2014-12-31 MED ORDER — GLYCOPYRROLATE 0.2 MG/ML IJ SOLN
INTRAMUSCULAR | Status: DC | PRN
  Administered 2014-12-31: 0.2 mg via INTRAVENOUS

## 2014-12-31 MED ORDER — FENTANYL CITRATE (PF) 100 MCG/2ML IJ SOLN
INTRAMUSCULAR | Status: AC
  Administered 2014-12-31: 25 ug via INTRAVENOUS
  Filled 2014-12-31: qty 2

## 2014-12-31 MED ORDER — CEFAZOLIN SODIUM 1-5 GM-% IV SOLN: 1.0000 g | INTRAVENOUS | Status: DC

## 2014-12-31 MED ORDER — MIDAZOLAM HCL 5 MG/5ML IJ SOLN
INTRAMUSCULAR | Status: DC | PRN
  Administered 2014-12-31 (×2): 1 mg via INTRAVENOUS

## 2014-12-31 MED ORDER — MIDAZOLAM HCL 2 MG/2ML IJ SOLN
INTRAMUSCULAR | Status: AC
  Filled 2014-12-31: qty 4

## 2014-12-31 MED ORDER — DIPHENHYDRAMINE HCL 50 MG/ML IJ SOLN: 12.5000 mg | Freq: Four times a day (QID) | INTRAMUSCULAR | Status: DC | PRN

## 2014-12-31 MED ORDER — MEPERIDINE HCL 50 MG/ML IJ SOLN: 6.2500 mg | INTRAMUSCULAR | Status: DC | PRN

## 2014-12-31 MED ORDER — OXYCODONE-ACETAMINOPHEN 5-325 MG PO TABS
1.0000 | ORAL_TABLET | ORAL | Status: DC | PRN
  Administered 2014-12-31 – 2015-01-01 (×2): 2 via ORAL
  Filled 2014-12-31 (×2): qty 2

## 2014-12-31 MED ORDER — LACTATED RINGERS IV SOLN: INTRAVENOUS | Status: DC

## 2014-12-31 MED ORDER — ONDANSETRON HCL 4 MG/2ML IJ SOLN
INTRAMUSCULAR | Status: DC | PRN
  Administered 2014-12-31: 4 mg via INTRAVENOUS

## 2014-12-31 MED ORDER — LIDOCAINE HCL (CARDIAC) 20 MG/ML IV SOLN
INTRAVENOUS | Status: AC
  Filled 2014-12-31: qty 5

## 2014-12-31 MED ORDER — ZOLPIDEM TARTRATE 5 MG PO TABS: 5.0000 mg | ORAL_TABLET | Freq: Every evening | ORAL | Status: DC | PRN

## 2014-12-31 MED ORDER — ADULT MULTIVITAMIN W/MINERALS CH
1.0000 | ORAL_TABLET | Freq: Every day | ORAL | Status: DC
  Administered 2015-01-01: 1 via ORAL
  Filled 2014-12-31: qty 1

## 2014-12-31 MED ORDER — DEXAMETHASONE SODIUM PHOSPHATE 10 MG/ML IJ SOLN
INTRAMUSCULAR | Status: AC
  Filled 2014-12-31: qty 1

## 2014-12-31 MED ORDER — 0.9 % SODIUM CHLORIDE (POUR BTL) OPTIME
TOPICAL | Status: DC | PRN
  Administered 2014-12-31: 1000 mL

## 2014-12-31 MED ORDER — LABETALOL HCL 5 MG/ML IV SOLN: 10.0000 mg | INTRAVENOUS | Status: DC | PRN

## 2014-12-31 MED ORDER — METOPROLOL TARTRATE 50 MG PO TABS
50.0000 mg | ORAL_TABLET | Freq: Every day | ORAL | Status: DC
  Administered 2015-01-01: 50 mg via ORAL
  Filled 2014-12-31: qty 1

## 2014-12-31 MED ORDER — SIMVASTATIN 40 MG PO TABS: 40.0000 mg | ORAL_TABLET | Freq: Every day | ORAL | Status: DC

## 2014-12-31 MED ORDER — FENTANYL CITRATE (PF) 100 MCG/2ML IJ SOLN
25.0000 ug | INTRAMUSCULAR | Status: DC | PRN
  Administered 2014-12-31 – 2015-01-01 (×7): 50 ug via INTRAVENOUS
  Filled 2014-12-31 (×7): qty 2

## 2014-12-31 MED ORDER — LACTATED RINGERS IV SOLN
INTRAVENOUS | Status: DC
  Administered 2014-12-31: 1000 mL via INTRAVENOUS

## 2014-12-31 MED ORDER — PROPOFOL 10 MG/ML IV BOLUS
INTRAVENOUS | Status: AC
  Filled 2014-12-31: qty 20

## 2014-12-31 MED ORDER — SUCCINYLCHOLINE CHLORIDE 20 MG/ML IJ SOLN
INTRAMUSCULAR | Status: DC | PRN
  Administered 2014-12-31: 100 mg via INTRAVENOUS

## 2014-12-31 MED ORDER — IOHEXOL 300 MG/ML  SOLN
INTRAMUSCULAR | Status: DC | PRN
  Administered 2014-12-31: 8 mL via INTRAVENOUS

## 2014-12-31 MED ORDER — ASPIRIN 81 MG PO CHEW
81.0000 mg | CHEWABLE_TABLET | Freq: Every day | ORAL | Status: DC
  Administered 2015-01-01: 81 mg via ORAL
  Filled 2014-12-31 (×2): qty 1

## 2014-12-31 MED ORDER — SODIUM CHLORIDE 0.9 % IV SOLN
1000.0000 mL | INTRAVENOUS | Status: DC
  Administered 2014-12-31: 1000 mL via INTRAVENOUS

## 2014-12-31 MED ORDER — DEXAMETHASONE SODIUM PHOSPHATE 10 MG/ML IJ SOLN
INTRAMUSCULAR | Status: DC | PRN
  Administered 2014-12-31: 10 mg via INTRAVENOUS

## 2014-12-31 MED ORDER — LACTATED RINGERS IV SOLN
INTRAVENOUS | Status: DC | PRN
  Administered 2014-12-31: 18:00:00 via INTRAVENOUS

## 2014-12-31 MED ORDER — HYDROMORPHONE HCL 1 MG/ML IJ SOLN
1.0000 mg | Freq: Once | INTRAMUSCULAR | Status: AC
  Administered 2014-12-31: 1 mg via INTRAVENOUS
  Filled 2014-12-31 (×2): qty 1

## 2014-12-31 MED ORDER — PROMETHAZINE HCL 25 MG/ML IJ SOLN: 6.2500 mg | INTRAMUSCULAR | Status: DC | PRN

## 2014-12-31 MED ORDER — PHENYLEPHRINE HCL 10 MG/ML IJ SOLN
INTRAMUSCULAR | Status: DC | PRN
  Administered 2014-12-31 (×3): 120 ug via INTRAVENOUS

## 2014-12-31 SURGICAL SUPPLY — 21 items
BAG URO CATCHER STRL LF (DRAPE) ×3 IMPLANT
BASKET LASER NITINOL 1.9FR (BASKET) IMPLANT
BSKT STON RTRVL 120 1.9FR (BASKET)
CATH INTERMIT  6FR 70CM (CATHETERS) ×3 IMPLANT
CLOTH BEACON ORANGE TIMEOUT ST (SAFETY) ×3 IMPLANT
EXTRACTOR STONE NITINOL NGAGE (UROLOGICAL SUPPLIES) ×3 IMPLANT
FIBER LASER FLEXIVA 200 (UROLOGICAL SUPPLIES) IMPLANT
FIBER LASER TRAC TIP (UROLOGICAL SUPPLIES) IMPLANT
GLOVE BIO SURGEON STRL SZ8 (GLOVE) ×3 IMPLANT
GOWN STRL REUS W/TWL XL LVL3 (GOWN DISPOSABLE) ×5 IMPLANT
GUIDEWIRE ANG ZIPWIRE 035X150 (WIRE) ×2 IMPLANT
GUIDEWIRE STR DUAL SENSOR (WIRE) ×3 IMPLANT
IV NS 1000ML (IV SOLUTION) ×3
IV NS 1000ML BAXH (IV SOLUTION) ×1 IMPLANT
MANIFOLD NEPTUNE II (INSTRUMENTS) ×3 IMPLANT
PACK CYSTO (CUSTOM PROCEDURE TRAY) ×3 IMPLANT
STENT CONTOUR 6FRX26X.038 (STENTS) ×1 IMPLANT
SYR CONTROL 10ML LL (SYRINGE) ×3 IMPLANT
TUBE FEEDING 8FR 16IN STR KANG (MISCELLANEOUS) ×3 IMPLANT
TUBING CONNECTING 10 (TUBING) ×2 IMPLANT
TUBING CONNECTING 10' (TUBING) ×1

## 2014-12-31 NOTE — ED Notes (Signed)
MD at bedside. 

## 2014-12-31 NOTE — Brief Op Note (Signed)
12/31/2014  6:43 PM  PATIENT:  Steven [name]  44 y.o. male  PRE-OPERATIVE DIAGNOSIS:  right ureteral stone  POST-OPERATIVE DIAGNOSIS:  right ureteral stone  PROCEDURE:  Procedure(s): CYSTOSCOPY  RIGHT URETEROSCOPY WITH STONE RETREIVAL RIGHT RETROGRADE PYELOGRAM (Right)  SURGEON:  Surgeon(s) and Role:    * Malen Gauze, MD - Primary  PHYSICIAN ASSISTANT:   ASSISTANTS: none   ANESTHESIA:   general  EBL:     BLOOD ADMINISTERED:none  DRAINS: none   LOCAL MEDICATIONS USED:  NONE  SPECIMEN:  Source of Specimen:  stone  DISPOSITION OF SPECIMEN:  N/A  COUNTS:  YES  TOURNIQUET:  * No tourniquets in log *  DICTATION: .Note written in EPIC  PLAN OF CARE: Admit for overnight observation  PATIENT DISPOSITION:  PACU - hemodynamically stable.   Delay start of Pharmacological VTE agent (>24hrs) due to surgical blood loss or risk of bleeding: not applicable

## 2014-12-31 NOTE — Consult Note (Signed)
Urology Consult  Referring physician: Dr. Donnald Garre Reason for referral: right flank pain, ureteral stone  Chief Complaint: right flank/testicular pain  History of Present Illness: Mr Coats is a 44yo who presented to the ER today with severe, sharp, constant, right flank pain radiating to his testicle. CT scan shows a 52mm mid ureteral stone with moderate hydronephrosis. He has associated nausea and vomiting. He had 1 other stone event 10 years ago.  Past Medical History  Diagnosis Date  . Coronary artery disease   . Hypertension   . Hypercholesteremia   . Kidney stone   . Tobacco use disorder   . Hx of CABG   . COPD (chronic obstructive pulmonary disease)    Past Surgical History  Procedure Laterality Date  . Coronary artery bypass graft    . Kidney stone surgery    . Appendectomy    . Lower extremity angiogram N/A 08/15/2012    Procedure: LOWER EXTREMITY ANGIOGRAM with possible PTA /stent;  Surgeon: Corky Crafts, MD;  Location: [facility] CATH LAB;  Service: Cardiovascular;  Laterality: N/A;  . Abdominal angiogram  08/15/2012    Procedure: ABDOMINAL ANGIOGRAM;  Surgeon: Corky Crafts, MD;  Location: [facility] CATH LAB;  Service: Cardiovascular;;  . Cardioversion N/A 08/28/2012    Procedure: Pseudo Compression;  Surgeon: Chuck Hint, MD;  Location: [facility] CATH LAB;  Service: Cardiovascular;  Laterality: N/A;    Medications: I have reviewed the patient's current medications. Allergies: No Known Allergies  Family History  Problem Relation Age of Onset  . Heart disease Father   . Hyperlipidemia Father   . Heart disease Maternal Grandmother    Social History:  reports that he has been smoking.  He quit smokeless tobacco use about 20 months ago. He reports that he does not drink alcohol or use illicit drugs.  Review of Systems  Gastrointestinal: Positive for nausea, vomiting and abdominal pain.  Genitourinary: Positive for dysuria, urgency, frequency and flank pain.  All other  systems reviewed and are negative.   Physical Exam:  Vital signs in last 24 hours: Temp:  [97.8 F (36.6 C)-97.9 F (36.6 C)] 97.8 F (36.6 C) (08/18 1237) Pulse Rate:  [82-95] 85 (08/18 1453) Resp:  [16-20] 16 (08/18 1453) BP: (170-187)/(97-113) 170/101 mmHg (08/18 1453) SpO2:  [96 %-100 %] 97 % (08/18 1453) Physical Exam  Constitutional: He is oriented to person, place, and time. He appears well-developed and well-nourished.  HENT:  Head: Normocephalic and atraumatic.  Eyes: EOM are normal. Pupils are equal, round, and reactive to light.  Neck: Normal range of motion. Neck supple. No thyromegaly present.  Cardiovascular: Normal rate and regular rhythm.   Respiratory: Effort normal and breath sounds normal.  GI: Soft. He exhibits no distension. Hernia confirmed negative in the right inguinal area and confirmed negative in the left inguinal area.  Genitourinary: Penis normal. Right testis shows no mass and no tenderness. Left testis shows no mass and no tenderness. No penile tenderness.  Musculoskeletal: Normal range of motion.  Neurological: He is alert and oriented to person, place, and time.  Skin: Skin is warm and dry.  Psychiatric: He has a normal mood and affect. His behavior is normal. Judgment and thought content normal.    Laboratory Data:  Results for orders placed or performed during the hospital encounter of 12/31/14 (from the past 72 hour(s))  Basic metabolic panel     Status: Abnormal   Collection Time: 12/31/14  9:23 AM  Result Value Ref Range  Sodium 138 135 - 145 mmol/L   Potassium 3.8 3.5 - 5.1 mmol/L   Chloride 104 101 - 111 mmol/L   CO2 26 22 - 32 mmol/L   Glucose, Bld 110 (H) 65 - 99 mg/dL   BUN 19 6 - 20 mg/dL   Creatinine, Ser 0.97 0.61 - 1.24 mg/dL   Calcium 9.0 8.9 - 10.3 mg/dL   GFR calc non Af Amer >60 >60 mL/min   GFR calc Af Amer >60 >60 mL/min    Comment: (NOTE) The eGFR has been calculated using the CKD EPI equation. This calculation has  not been validated in all clinical situations. eGFR's persistently <60 mL/min signify possible Chronic Kidney Disease.    Anion gap 8 5 - 15  CBC with Differential     Status: None   Collection Time: 12/31/14  9:23 AM  Result Value Ref Range   WBC 6.5 4.0 - 10.5 K/uL   RBC 4.38 4.22 - 5.81 MIL/uL   Hemoglobin 14.0 13.0 - 17.0 g/dL   HCT 41.7 39.0 - 52.0 %   MCV 95.2 78.0 - 100.0 fL   MCH 32.0 26.0 - 34.0 pg   MCHC 33.6 30.0 - 36.0 g/dL   RDW 13.7 11.5 - 15.5 %   Platelets 268 150 - 400 K/uL   Neutrophils Relative % 69 43 - 77 %   Neutro Abs 4.5 1.7 - 7.7 K/uL   Lymphocytes Relative 17 12 - 46 %   Lymphs Abs 1.1 0.7 - 4.0 K/uL   Monocytes Relative 9 3 - 12 %   Monocytes Absolute 0.6 0.1 - 1.0 K/uL   Eosinophils Relative 4 0 - 5 %   Eosinophils Absolute 0.3 0.0 - 0.7 K/uL   Basophils Relative 1 0 - 1 %   Basophils Absolute 0.0 0.0 - 0.1 K/uL  Urinalysis, Routine w reflex microscopic (not at [facility])     Status: None   Collection Time: 12/31/14 11:45 AM  Result Value Ref Range   Color, Urine YELLOW YELLOW   APPearance CLEAR CLEAR   Specific Gravity, Urine 1.011 1.005 - 1.030   pH 6.0 5.0 - 8.0   Glucose, UA NEGATIVE NEGATIVE mg/dL   Hgb urine dipstick NEGATIVE NEGATIVE   Bilirubin Urine NEGATIVE NEGATIVE   Ketones, ur NEGATIVE NEGATIVE mg/dL   Protein, ur NEGATIVE NEGATIVE mg/dL   Urobilinogen, UA 0.2 0.0 - 1.0 mg/dL   Nitrite NEGATIVE NEGATIVE   Leukocytes, UA NEGATIVE NEGATIVE    Comment: MICROSCOPIC NOT DONE ON URINES WITH NEGATIVE PROTEIN, BLOOD, LEUKOCYTES, NITRITE, OR GLUCOSE <1000 mg/dL.   No results found for this or any previous visit (from the past 240 hour(s)). Creatinine:  Recent Labs  12/29/14 0546 12/31/14 0923  CREATININE 1.16 0.97     Impression/Assessment:  Right ureteral stone  Plan:  1. Risks/benefits/alternatives to right stone extraction was explained to the patient and he understands and wishes to proceed with surgery.   MCKENZIE, PATRICK  L 12/31/2014, 3:21 PM

## 2014-12-31 NOTE — Progress Notes (Signed)
44 yr old self pay [place] pt reports to CM he has recently obtained a pcp with [facility]  EPIC updated

## 2014-12-31 NOTE — Anesthesia Preprocedure Evaluation (Signed)
Anesthesia Evaluation  Patient identified by MRN, date of birth, ID band Patient awake    Reviewed: Allergy & Precautions, NPO status , Patient's Chart, lab work & pertinent test results  Airway Mallampati: II  TM Distance: >3 FB Neck ROM: Full    Dental no notable dental hx. (+) Missing, Poor Dentition   Pulmonary COPDCurrent Smoker,  breath sounds clear to auscultation  Pulmonary exam normal       Cardiovascular hypertension, Pt. on medications + CAD and + CABG Normal cardiovascular examRhythm:Regular Rate:Normal     Neuro/Psych negative neurological ROS  negative psych ROS   GI/Hepatic negative GI ROS, Neg liver ROS,   Endo/Other  negative endocrine ROS  Renal/GU negative Renal ROS  negative genitourinary   Musculoskeletal negative musculoskeletal ROS (+)   Abdominal   Peds negative pediatric ROS (+)  Hematology negative hematology ROS (+)   Anesthesia Other Findings   Reproductive/Obstetrics negative OB ROS                             Anesthesia Physical Anesthesia Plan  ASA: III  Anesthesia Plan: General   Post-op Pain Management:    Induction: Intravenous, Rapid sequence and Cricoid pressure planned  Airway Management Planned: Oral ETT  Additional Equipment:   Intra-op Plan:   Post-operative Plan: Extubation in OR  Informed Consent: I have reviewed the patients History and Physical, chart, labs and discussed the procedure including the risks, benefits and alternatives for the proposed anesthesia with the patient or authorized representative who has indicated his/her understanding and acceptance.   Dental advisory given  Plan Discussed with: CRNA  Anesthesia Plan Comments:         Anesthesia Quick Evaluation

## 2014-12-31 NOTE — ED Notes (Signed)
Pt c/o R flank pain radiating into abdomen and nausea x 2 days.  Pain score 10/10.  Pt was seen at [facility] x 2 days ago for same.  Sts "I've been taking my medication, but it isn't helping.  I thought I would pass it by now."  Pt has not contacted follow-up.

## 2014-12-31 NOTE — Anesthesia Procedure Notes (Signed)
Procedure Name: Intubation Date/Time: 12/31/2014 6:09 PM Performed by: Illene Silver Pre-anesthesia Checklist: Patient identified, Emergency Drugs available, Suction available and Patient being monitored Patient Re-evaluated:Patient Re-evaluated prior to inductionOxygen Delivery Method: Circle System Utilized Preoxygenation: Pre-oxygenation with 100% oxygen Intubation Type: IV induction Ventilation: Mask ventilation without difficulty Laryngoscope Size: Mac and 4 Grade View: Grade II Tube type: Oral Number of attempts: 1 Airway Equipment and Method: Stylet and Oral airway Placement Confirmation: ETT inserted through vocal cords under direct vision,  positive ETCO2 and breath sounds checked- equal and bilateral Secured at: 21 cm Tube secured with: Tape Dental Injury: Teeth and Oropharynx as per pre-operative assessment  Comments: After sux rapid sequence , clear fluid in posterior oralpharynx, suctioned immediately,cords clear. O2 sats  100%.NG inserted with 500 cc's gastric light Reidel secretions

## 2014-12-31 NOTE — Transfer of Care (Signed)
Immediate Anesthesia Transfer of Care Note  Patient: Steven [name]  Procedure(s) Performed: Procedure(s): CYSTOSCOPY  RIGHT URETEROSCOPY WITH STONE RETREIVAL RIGHT RETROGRADE PYELOGRAM (Right)  Patient Location: PACU  Anesthesia Type:General  Level of Consciousness: awake, alert , oriented and patient cooperative  Airway & Oxygen Therapy: Patient Spontanous Breathing and Patient connected to face mask oxygen  Post-op Assessment: Report given to RN, Post -op Vital signs reviewed and stable and Patient moving all extremities X 4  Post vital signs: stable  Last Vitals:  Filed Vitals:   12/31/14 1453  BP: 170/101  Pulse: 85  Temp:   Resp: 16    Complications: No apparent anesthesia complications

## 2014-12-31 NOTE — ED Provider Notes (Addendum)
CSN: [id number]     Arrival date & time 12/31/14  0809 History   First MD Initiated Contact with Patient 12/31/14 [phone]     Chief Complaint  Patient presents with  . Flank Pain  . Abdominal Pain  . Nausea     (Consider location/radiation/quality/duration/timing/severity/associated sxs/prior Treatment) HPI  Patient had diagnosed kidney stone 2 days ago. He wanted to try to pass it at home. He has been taking fluids and his pain medications for 2 days. He reports that the pain is getting much worse and he has not passed the stone. This continues to be in the right flank and radiating into his groin and testicle. He reports that he has had nausea, he has not developed a fever. Past Medical History  Diagnosis Date  . Coronary artery disease   . Hypertension   . Hypercholesteremia   . Kidney stone   . Tobacco use disorder   . Hx of CABG   . COPD (chronic obstructive pulmonary disease)    Past Surgical History  Procedure Laterality Date  . Coronary artery bypass graft    . Kidney stone surgery    . Appendectomy    . Lower extremity angiogram N/A 08/15/2012    Procedure: LOWER EXTREMITY ANGIOGRAM with possible PTA /stent;  Surgeon: Corky Crafts, MD;  [facility] CATH LAB;  Service: Cardiovascular;  Laterality: N/A;  . Abdominal angiogram  08/15/2012    Procedure: ABDOMINAL ANGIOGRAM;  Surgeon: Corky Crafts, MD;  Location: [facility] CATH LAB;  Service: Cardiovascular;;  . Cardioversion N/A 08/28/2012    Procedure: Pseudo Compression;  Surgeon: Chuck Hint, MD;  Location: [facility] CATH LAB;  Service: Cardiovascular;  Laterality: N/A;   Family History  Problem Relation Age of Onset  . Heart disease Father   . Hyperlipidemia Father   . Heart disease Maternal Grandmother    Social History  Substance Use Topics  . Smoking status: Current Every Day Smoker -- 1.00 packs/day for 31 years  . Smokeless tobacco: Former Neurosurgeon    Quit date: 04/27/2013  . Alcohol Use: No     Comment:  STATES HE QUIT 06/2013 ATTENDS DAILY AA MEETINGS    Review of Systems 10 Systems reviewed and are negative for acute change except as noted in the HPI.    Allergies  Review of patient's allergies indicates no known allergies.  Home Medications   Prior to Admission medications   Medication Sig Start Date End Date Taking? Authorizing Provider  aspirin 81 MG tablet Take 1 tablet (81 mg total) by mouth daily. 02/25/14  Yes Gaylord Shih, MD  gabapentin (NEURONTIN) 100 MG capsule Take 100 mg by mouth 3 (three) times daily.   Yes Historical Provider, MD  HYDROcodone-acetaminophen (NORCO) 7.5-325 MG per tablet Take 1 tablet by mouth every 6 (six) hours as needed for moderate pain. 12/29/14  Yes Derwood Kaplan, MD  ibuprofen (ADVIL,MOTRIN) 600 MG tablet Take 1 tablet (600 mg total) by mouth every 6 (six) hours as needed. Patient taking differently: Take 1,200 mg by mouth every 6 (six) hours as needed for moderate pain.  12/29/14  Yes Derwood Kaplan, MD  metoprolol (LOPRESSOR) 50 MG tablet Take 50 mg by mouth daily.    Yes Historical Provider, MD  Multiple Vitamin (MULTIVITAMIN WITH MINERALS) TABS tablet Take 1 tablet by mouth daily.   Yes Historical Provider, MD  ondansetron (ZOFRAN ODT) 8 MG disintegrating tablet Take 1 tablet (8 mg total) by mouth every 8 (eight) hours as needed  for nausea. 12/29/14  Yes Derwood Kaplan, MD  simvastatin (ZOCOR) 40 MG tablet Take 1 tablet (40 mg total) by mouth at bedtime. 02/25/14  Yes Gaylord Shih, MD  tamsulosin (FLOMAX) 0.4 MG CAPS capsule Take 1 capsule (0.4 mg total) by mouth daily. 12/29/14  Yes Derwood Kaplan, MD  diazepam (VALIUM) 5 MG tablet Take 1 tablet (5 mg total) by mouth every 6 (six) hours as needed for muscle spasms. Patient not taking: Reported on 12/31/2014 09/07/13   Lorre Nick, MD  losartan (COZAAR) 50 MG tablet Take 1 tablet (50 mg total) by mouth daily. Patient not taking: Reported on 07/06/2014 02/25/14   Gaylord Shih, MD   oxyCODONE-acetaminophen (PERCOCET) 5-325 MG per tablet Take 1-2 tablets by mouth every 4 (four) hours as needed. Patient not taking: Reported on 12/29/2014 07/06/14   Dahlia Client Muthersbaugh, PA-C   BP 170/97 mmHg  Pulse 82  Temp(Src) 97.8 F (36.6 C) (Oral)  Resp 20  SpO2 99% Physical Exam  Constitutional: He is oriented to person, place, and time. He appears well-developed and well-nourished.  Patient appears to be in moderate to severe pain. He is alert and appropriate. He has no respiratory distress.  HENT:  Head: Normocephalic and atraumatic.  Eyes: EOM are normal. No scleral icterus.  Neck: Neck supple.  Cardiovascular: Normal rate, regular rhythm, normal heart sounds and intact distal pulses.   Pulmonary/Chest: Effort normal and breath sounds normal.  Abdominal: Soft. Bowel sounds are normal. He exhibits no distension.  Musculoskeletal: Normal range of motion. He exhibits no edema or tenderness.  Neurological: He is alert and oriented to person, place, and time. He has normal strength. Coordination normal. GCS eye subscore is 4. GCS verbal subscore is 5. GCS motor subscore is 6.  Skin: Skin is warm, dry and intact.  Psychiatric: He has a normal mood and affect.    ED Course  Procedures (including critical care time) Labs Review Labs Reviewed  BASIC METABOLIC PANEL - Abnormal; Notable for the following:    Glucose, Bld 110 (*)    All other components within normal limits  CBC WITH DIFFERENTIAL/PLATELET  URINALYSIS, ROUTINE W REFLEX MICROSCOPIC (NOT AT [facility]    Imaging Review US Renal  12/31/2014   CLINICAL DATA:  Right flank pain for 2 days.  EXAM: RENAL / URINARY TRACT ULTRASOUND COMPLETE  COMPARISON:  CT scan 12/29/2014  FINDINGS: Right Kidney:  Length: 11.3 cm. Normal renal cortical thickness and echogenicity without focal lesions. No hydronephrosis.  Left Kidney:  Length: 12.4 cm. Normal renal cortical thickness and echogenicity without focal lesions or hydronephrosis.   Bladder:  Appears normal for degree of bladder distention.  IMPRESSION: Resolution of right-sided hydronephrosis likely due to passage of right ureteral calculus.   Electronically Signed   By: Rudie Meyer M.D.   On: 12/31/2014 13:00   I have personally reviewed and evaluated these images and lab results as part of my medical decision-making.   EKG Interpretation None     Consult: Patient's case has been reviewed with Dr. Thea Silversmith. He plans to take the patient to the OR for stent placement. Dr. Thea Silversmith feels that the patient's pain is so severe because the stone is likely sitting right at the UVJ and causing the patient to have bladder spasms and testicular pain. MDM   Final diagnoses:  Right kidney stone  Intractable pain   Patient has known kidney stone. He has not been able to pass this and has had escalating pain. We have had  significant difficulty controlling pain in the emergency department. The patient is alert and appropriate and ambulatory. He has been given high doses of Dilaudid in conjunction with toradol and Flomax and fluids. He does continue to have significant flank pain and associated testicular pain. At this time we will await urology for definitive management.    Arby Barrette, MD 12/31/14 1311  Arby Barrette, MD 12/31/14 [phone]

## 2014-12-31 NOTE — Op Note (Signed)
Preoperative diagnosis: Right ureteral stone  Postoperative diagnosis: Same  Procedure: 1 cystoscopy 2 right retrograde pyelography 3.  Intraoperative fluoroscopy, under one hour, with interpretation 4.  Right ureteroscopic stone manipulation with basket lithotripsy   Attending: Wilkie Aye  Anesthesia: General  Estimated blood loss: None  Drains: none  Specimens: stone for analysis  Antibiotics: ancef  Findings: right distal ureteral stone with mild proximal hydroureteronephrosis.  Indications: Patient is a 44 year old male/male with a history of ureteral stone and who has failed medical expulsive therapy.  After discussing treatment options, he decided proceed with right ureteroscopic stone manipulation.  Procedure in detail: The patient was brought to the operating room and a brief timeout was done to ensure correct patient, correct procedure, correct site.  General anesthesia was administered patient was placed in dorsal lithotomy position.  His genitalia was then prepped and draped in usual sterile fashion.  A rigid 22 French cystoscope was passed in the urethra and the bladder.  Bladder was inspected free masses or lesions.  the ureteral orifices were in the normal orthotopic locations.  a 6 french ureteral catheter was then instilled into the right ureter orifice.  a gentle retrograde was obtained and findings noted above.  we then placed a zip wire through the ureteral catheter and advanced up to the renal pelvis.  we then removed the cystoscope and cannulated the right ureteral orifice with a semirigid ureteroscope.  we then encountered the stone in the mid ureter.  The stone was then removed with a Ngage basket.  Repeat ureteroscopy revealed to stones. The ureter was widely patent so we decided not to leave a stent   the bladder was then drained and this concluded the procedure which was well tolerated by patient.  Complications: None  Condition: Stable, extubated,  transferred to PACU  Plan: Patient is to be admitted overnight because oft he concern for aspiration and for pain control.

## 2014-12-31 NOTE — Anesthesia Postprocedure Evaluation (Signed)
  Anesthesia Post-op Note  Patient: Steven [name]  Procedure(s) Performed: Procedure(s) (LRB): CYSTOSCOPY  RIGHT URETEROSCOPY WITH STONE RETREIVAL RIGHT RETROGRADE PYELOGRAM (Right)  Patient Location: PACU  Anesthesia Type: General  Level of Consciousness: awake and alert   Airway and Oxygen Therapy: Patient Spontanous Breathing  Post-op Pain: mild  Post-op Assessment: Post-op Vital signs reviewed, Patient's Cardiovascular Status Stable, Respiratory Function Stable, Patent Airway and No signs of Nausea or vomiting  Last Vitals:  Filed Vitals:   12/31/14 2002  BP: 140/81  Pulse:   Temp:   Resp:     Post-op Vital Signs: stable   Complications: No apparent anesthesia complications

## 2015-01-01 ENCOUNTER — Encounter (HOSPITAL_COMMUNITY): Payer: Self-pay | Admitting: Urology

## 2015-01-01 MED ORDER — PHENOL 1.4 % MT LIQD
1.0000 | OROMUCOSAL | Status: DC | PRN
  Administered 2015-01-01: 1 via OROMUCOSAL
  Filled 2015-01-01: qty 177

## 2015-01-01 MED ORDER — PHENAZOPYRIDINE HCL 100 MG PO TABS
100.0000 mg | ORAL_TABLET | Freq: Three times a day (TID) | ORAL | Status: DC
  Administered 2015-01-01: 100 mg via ORAL
  Filled 2015-01-01 (×4): qty 1

## 2015-01-01 MED ORDER — ALUM & MAG HYDROXIDE-SIMETH 200-200-20 MG/5ML PO SUSP
30.0000 mL | ORAL | Status: DC | PRN
  Administered 2015-01-01: 30 mL via ORAL
  Filled 2015-01-01: qty 30

## 2015-01-01 MED ORDER — OXYCODONE-ACETAMINOPHEN 5-325 MG PO TABS: 1.0000 | ORAL_TABLET | ORAL | Status: DC | PRN

## 2015-01-01 NOTE — Discharge Instructions (Signed)

## 2015-01-04 NOTE — Discharge Summary (Signed)
Physician Discharge Summary  Patient ID: Steven [name] MRN: [id number] DOB/AGE: 1970-05-28 44 y.o.  Admit date: 12/31/2014 Discharge date: 01/01/2015 Admission Diagnoses:  Discharge Diagnoses:  Active Problems:   Ureteral stone   Discharged Condition: good  Hospital Course: The patient tolerated the procedure well and was transferred to the floor on IV pain meds, IV fluid. On POD#1 pt was started on clear liquid diet and they ambulated in the halls.  Prior to discharge the pt was tolerating a regular diet, pain was controlled on PO pain meds, they were ambulating without difficulty, and they had normal bowel function.   Consults: None  Significant Diagnostic Studies: none  Treatments: surgery: right stone extraction  Discharge Exam: Blood pressure 165/94, pulse 62, temperature 98.3 F (36.8 C), temperature source Oral, resp. rate 20, height  (1.778 m), weight 101.1 kg (222 lb 14.2 oz), SpO2 98 %. General appearance: alert, cooperative and appears stated age Head: Normocephalic, without obvious abnormality, atraumatic Eyes: conjunctivae/corneas clear. PERRL, EOM's intact. Fundi benign. Nose: Nares normal. Septum midline. Mucosa normal. No drainage or sinus tenderness. Neck: no adenopathy, no carotid bruit, no JVD, supple, symmetrical, trachea midline and thyroid not enlarged, symmetric, no tenderness/mass/nodules Resp: clear to auscultation bilaterally Cardio: regular rate and rhythm, S1, S2 normal, no murmur, click, rub or gallop GI: soft, non-tender; bowel sounds normal; no masses,  no organomegaly Extremities: extremities normal, atraumatic, no cyanosis or edema Skin: Skin color, texture, turgor normal. No rashes or lesions  Disposition: 01-Home or Self Care     Medication List    STOP taking these medications        HYDROcodone-acetaminophen 7.5-325 MG per tablet  Commonly known as:  NORCO      TAKE these medications        aspirin 81 MG tablet  Take 1  tablet (81 mg total) by mouth daily.     diazepam 5 MG tablet  Commonly known as:  VALIUM  Take 1 tablet (5 mg total) by mouth every 6 (six) hours as needed for muscle spasms.     gabapentin 100 MG capsule  Commonly known as:  NEURONTIN  Take 100 mg by mouth 3 (three) times daily.     ibuprofen 600 MG tablet  Commonly known as:  ADVIL,MOTRIN  Take 1 tablet (600 mg total) by mouth every 6 (six) hours as needed.     losartan 50 MG tablet  Commonly known as:  COZAAR  Take 1 tablet (50 mg total) by mouth daily.     metoprolol 50 MG tablet  Commonly known as:  LOPRESSOR  Take 50 mg by mouth daily.     multivitamin with minerals Tabs tablet  Take 1 tablet by mouth daily.     ondansetron 8 MG disintegrating tablet  Commonly known as:  ZOFRAN ODT  Take 1 tablet (8 mg total) by mouth every 8 (eight) hours as needed for nausea.     oxyCODONE-acetaminophen 5-325 MG per tablet  Commonly known as:  PERCOCET  Take 1-2 tablets by mouth every 4 (four) hours as needed.     simvastatin 40 MG tablet  Commonly known as:  ZOCOR  Take 1 tablet (40 mg total) by mouth at bedtime.     tamsulosin 0.4 MG Caps capsule  Commonly known as:  FLOMAX  Take 1 capsule (0.4 mg total) by mouth daily.           Follow-up Information    Follow up with Miku Udall, Mardene Celeste, MD. Call  in 2 weeks.   Specialty:  Urology   Contact information:   [address] [place] [address] [phone]       Signed: Malen Gauze 01/04/2015, 11:51 AM

## 2015-02-08 ENCOUNTER — Encounter (HOSPITAL_COMMUNITY): Payer: Self-pay | Admitting: Emergency Medicine

## 2015-02-08 ENCOUNTER — Emergency Department (HOSPITAL_COMMUNITY): Payer: Self-pay

## 2015-02-08 ENCOUNTER — Emergency Department (HOSPITAL_COMMUNITY)
Admission: EM | Admit: 2015-02-08 | Discharge: 2015-02-09 | Payer: No Typology Code available for payment source | Attending: Emergency Medicine | Admitting: Emergency Medicine

## 2015-02-08 DIAGNOSIS — J441 Chronic obstructive pulmonary disease with (acute) exacerbation: Secondary | ICD-10-CM | POA: Insufficient documentation

## 2015-02-08 DIAGNOSIS — Z720 Tobacco use: Secondary | ICD-10-CM | POA: Insufficient documentation

## 2015-02-08 DIAGNOSIS — F32A Depression, unspecified: Secondary | ICD-10-CM

## 2015-02-08 DIAGNOSIS — R45851 Suicidal ideations: Secondary | ICD-10-CM | POA: Diagnosis not present

## 2015-02-08 DIAGNOSIS — Z7982 Long term (current) use of aspirin: Secondary | ICD-10-CM | POA: Insufficient documentation

## 2015-02-08 DIAGNOSIS — F329 Major depressive disorder, single episode, unspecified: Secondary | ICD-10-CM | POA: Insufficient documentation

## 2015-02-08 DIAGNOSIS — E780 Pure hypercholesterolemia: Secondary | ICD-10-CM | POA: Insufficient documentation

## 2015-02-08 DIAGNOSIS — I10 Essential (primary) hypertension: Secondary | ICD-10-CM | POA: Insufficient documentation

## 2015-02-08 DIAGNOSIS — F314 Bipolar disorder, current episode depressed, severe, without psychotic features: Secondary | ICD-10-CM | POA: Diagnosis not present

## 2015-02-08 DIAGNOSIS — Z9889 Other specified postprocedural states: Secondary | ICD-10-CM | POA: Insufficient documentation

## 2015-02-08 DIAGNOSIS — I2510 Atherosclerotic heart disease of native coronary artery without angina pectoris: Secondary | ICD-10-CM | POA: Insufficient documentation

## 2015-02-08 DIAGNOSIS — Z87442 Personal history of urinary calculi: Secondary | ICD-10-CM | POA: Insufficient documentation

## 2015-02-08 DIAGNOSIS — Z79899 Other long term (current) drug therapy: Secondary | ICD-10-CM | POA: Insufficient documentation

## 2015-02-08 DIAGNOSIS — Z951 Presence of aortocoronary bypass graft: Secondary | ICD-10-CM | POA: Insufficient documentation

## 2015-02-08 HISTORY — DX: Bipolar disorder, unspecified: F31.9

## 2015-02-08 LAB — COMPREHENSIVE METABOLIC PANEL
ALBUMIN: 3.7 g/dL (ref 3.5–5.0)
ALK PHOS: 70 U/L (ref 38–126)
ALT: 23 U/L (ref 17–63)
ANION GAP: 8 (ref 5–15)
AST: 30 U/L (ref 15–41)
BUN: 17 mg/dL (ref 6–20)
CALCIUM: 8.9 mg/dL (ref 8.9–10.3)
CHLORIDE: 106 mmol/L (ref 101–111)
CO2: 24 mmol/L (ref 22–32)
Creatinine, Ser: 0.93 mg/dL (ref 0.61–1.24)
GFR calc Af Amer: >60 mL/min (ref >60)
GFR calc non Af Amer: >60 mL/min (ref >60)
GLUCOSE: 102 mg/dL — AB (ref 65–99)
Potassium: 3.7 mmol/L (ref 3.5–5.1)
SODIUM: 138 mmol/L (ref 135–145)
Total Bilirubin: 0.4 mg/dL (ref 0.3–1.2)
Total Protein: 6.7 g/dL (ref 6.5–8.1)

## 2015-02-08 LAB — RAPID URINE DRUG SCREEN, HOSP PERFORMED
AMPHETAMINES: NOT DETECTED
Barbiturates: NOT DETECTED
Benzodiazepines: NOT DETECTED
Cocaine: POSITIVE — AB
Opiates: NOT DETECTED
TETRAHYDROCANNABINOL: NOT DETECTED

## 2015-02-08 LAB — URINALYSIS, ROUTINE W REFLEX MICROSCOPIC
Bilirubin Urine: NEGATIVE
Glucose, UA: NEGATIVE mg/dL
HGB URINE DIPSTICK: NEGATIVE
Ketones, ur: NEGATIVE mg/dL
LEUKOCYTES UA: NEGATIVE
Nitrite: NEGATIVE
Protein, ur: NEGATIVE mg/dL
SPECIFIC GRAVITY, URINE: 1.017 (ref 1.005–1.030)
UROBILINOGEN UA: 0.2 mg/dL (ref 0.0–1.0)
pH: 6.5 (ref 5.0–8.0)

## 2015-02-08 LAB — CBC WITH DIFFERENTIAL/PLATELET
BASOS PCT: 0 %
Basophils Absolute: 0 10*3/uL (ref 0.0–0.1)
EOS ABS: 0.5 10*3/uL (ref 0.0–0.7)
Eosinophils Relative: 7 %
HCT: 43.5 % (ref 39.0–52.0)
HEMOGLOBIN: 14.6 g/dL (ref 13.0–17.0)
Lymphocytes Relative: 19 %
Lymphs Abs: 1.3 10*3/uL (ref 0.7–4.0)
MCH: 33.1 pg (ref 26.0–34.0)
MCHC: 33.6 g/dL (ref 30.0–36.0)
MCV: 98.6 fL (ref 78.0–100.0)
Monocytes Absolute: 0.7 10*3/uL (ref 0.1–1.0)
Monocytes Relative: 11 %
NEUTROS PCT: 63 %
Neutro Abs: 4.2 10*3/uL (ref 1.7–7.7)
Platelets: 251 10*3/uL (ref 150–400)
RBC: 4.41 MIL/uL (ref 4.22–5.81)
RDW: 13.9 % (ref 11.5–15.5)
WBC: 6.7 10*3/uL (ref 4.0–10.5)

## 2015-02-08 LAB — I-STAT TROPONIN, ED
TROPONIN I, POC: 0.01 ng/mL (ref 0.00–0.08)
Troponin i, poc: 0.01 ng/mL (ref 0.00–0.08)

## 2015-02-08 LAB — BRAIN NATRIURETIC PEPTIDE: B Natriuretic Peptide: 27.6 pg/mL (ref 0.0–100.0)

## 2015-02-08 LAB — ETHANOL: Alcohol, Ethyl (B): <5 mg/dL (ref <5)

## 2015-02-08 MED ORDER — ALBUTEROL SULFATE HFA 108 (90 BASE) MCG/ACT IN AERS
2.0000 | INHALATION_SPRAY | RESPIRATORY_TRACT | Status: DC | PRN
  Administered 2015-02-09: 2 via RESPIRATORY_TRACT
  Filled 2015-02-08: qty 6.7

## 2015-02-08 MED ORDER — GABAPENTIN 100 MG PO CAPS
ORAL_CAPSULE | ORAL | Status: AC
  Filled 2015-02-08: qty 1

## 2015-02-08 MED ORDER — TRAZODONE HCL 50 MG PO TABS
50.0000 mg | ORAL_TABLET | Freq: Every evening | ORAL | Status: DC | PRN
  Administered 2015-02-08: 50 mg via ORAL
  Filled 2015-02-08: qty 1

## 2015-02-08 MED ORDER — PREDNISONE 20 MG PO TABS
40.0000 mg | ORAL_TABLET | Freq: Every day | ORAL | Status: DC
  Administered 2015-02-09: 40 mg via ORAL
  Filled 2015-02-08 (×2): qty 2

## 2015-02-08 MED ORDER — IBUPROFEN 200 MG PO TABS: 600.0000 mg | ORAL_TABLET | Freq: Four times a day (QID) | ORAL | Status: DC | PRN

## 2015-02-08 MED ORDER — LURASIDONE HCL 80 MG PO TABS
80.0000 mg | ORAL_TABLET | Freq: Every day | ORAL | Status: DC
  Administered 2015-02-08: 80 mg via ORAL
  Filled 2015-02-08 (×2): qty 1

## 2015-02-08 MED ORDER — METOPROLOL TARTRATE 25 MG PO TABS
50.0000 mg | ORAL_TABLET | Freq: Two times a day (BID) | ORAL | Status: DC
  Administered 2015-02-08: 50 mg via ORAL
  Filled 2015-02-08: qty 2

## 2015-02-08 MED ORDER — ASPIRIN EC 81 MG PO TBEC
81.0000 mg | DELAYED_RELEASE_TABLET | Freq: Every day | ORAL | Status: DC
  Administered 2015-02-08: 81 mg via ORAL
  Filled 2015-02-08 (×2): qty 1

## 2015-02-08 MED ORDER — IPRATROPIUM-ALBUTEROL 0.5-2.5 (3) MG/3ML IN SOLN
3.0000 mL | Freq: Once | RESPIRATORY_TRACT | Status: AC
  Administered 2015-02-08: 3 mL via RESPIRATORY_TRACT
  Filled 2015-02-08: qty 3

## 2015-02-08 MED ORDER — PREDNISONE 20 MG PO TABS
60.0000 mg | ORAL_TABLET | Freq: Once | ORAL | Status: AC
  Administered 2015-02-08: 60 mg via ORAL
  Filled 2015-02-08: qty 3

## 2015-02-08 MED ORDER — GABAPENTIN 100 MG PO CAPS
100.0000 mg | ORAL_CAPSULE | Freq: Three times a day (TID) | ORAL | Status: DC
  Administered 2015-02-08 (×2): 100 mg via ORAL
  Filled 2015-02-08: qty 1

## 2015-02-08 MED ORDER — SIMVASTATIN 40 MG PO TABS
40.0000 mg | ORAL_TABLET | Freq: Every day | ORAL | Status: DC
Start: 2015-02-08 — End: 2015-02-09
  Administered 2015-02-08: 40 mg via ORAL
  Filled 2015-02-08 (×2): qty 1

## 2015-02-08 MED ORDER — LORAZEPAM 1 MG PO TABS
1.0000 mg | ORAL_TABLET | Freq: Once | ORAL | Status: AC
  Administered 2015-02-08: 1 mg via ORAL
  Filled 2015-02-08: qty 1

## 2015-02-08 NOTE — BH Assessment (Signed)
[facility] Assessment Progress Note  The following facilities have been contacted to seek placement for this pt, with results as noted:  Beds available, information sent, decision pending:  [facility]   No beds available, but accepting referrals for future consideration; information sent:  [facility]   At capacity:  [facility]    Leam Madero, MA Triage Specialist [phone]     

## 2015-02-08 NOTE — ED Notes (Signed)
Patient alert and verbal. Observed lying on stretcher watching television. No concerns voiced at this time. q 15 minute checks in progress. Monitoring continues.

## 2015-02-08 NOTE — ED Notes (Signed)
Pt stated "I live with my disabled uncle in an assisted living facility.  He has a nurse come in for 2 hours to help but I'm tired of taking care of him".  Pt did not want to discuss further.

## 2015-02-08 NOTE — Progress Notes (Signed)
CSW referred the patient to [facility] in order to try and seek inpatient placement.  Trish Mage [phone] ED CSW 02/08/2015 11:15 PM

## 2015-02-08 NOTE — ED Notes (Signed)
Pt states he is feeling short of breath for the past few days and he has COPD  Pt states also he has bipolar and takes Latuda for that but he states he is feeling extremely suicidal this morning  Pt is in no acute distress in triage

## 2015-02-08 NOTE — ED Notes (Signed)
Provider at bedside
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
## 2015-02-08 NOTE — ED Notes (Signed)
Pt escorted to [facility] [address] by Tech.  Tech also transported pt belongings to [employer].

## 2015-02-08 NOTE — ED Notes (Signed)
Patient's belongings placed in locker 29.  

## 2015-02-08 NOTE — ED Notes (Signed)
Pt received a phone call from father.  Pt was provided a sandwich by sitter.

## 2015-02-08 NOTE — Consult Note (Signed)
[facility] Consult   Reason for Consult:  Suicidal ideations Referring Physician:  EDP Patient Identification: Steven [name] MRN:  [id number] Principal Diagnosis: Bipolar affective disorder, current episode depressed Diagnosis:   Patient Active Problem List   Diagnosis Date Noted  . Bipolar affective disorder, current episode depressed [F31.30] 02/08/2015    Priority: High  . Ureteral stone [N20.1] 12/31/2014  . Acute respiratory failure [J96.00] 04/27/2013  . Chest pain [R07.9] 08/14/2012  . Exertional shortness of breath [R06.02] 08/14/2012  . Leg pain [M79.606] 08/14/2012  . Claudication of left lower extremity [I73.9] 08/14/2012  . Alcohol abuse, continuous [F10.10] 11/29/2011    Class: Acute  . CAD s/p CABG 2012 [facility] [I25.810] 11/28/2011  . Hyperlipidemia LDL goal <100 [E78.5] 03/13/2007  . SUBSTANCE ABUSE [IMO0002] 03/13/2007  . Essential hypertension [I10] 03/13/2007  . GERD [K21.9] 03/13/2007    Total Time spent with patient: 45 minutes  Subjective:   Steven [name] is a 44 y.o. male patient admitted with suicidal ideations and plan to shoot himself.  HPI:  44 yo male presented to the ED with a plan to shoot himself like his cousin did, history of bipolar disorder.  He is a patient at [facility] and takes Latuda 80 mg daily.  Steven [name] is stressed with taking care of his brain injured uncle who he also lives with, since 2001.  Denies homicidal ideations and hallucinations.  Cocaine abuse prior to admission. HPI Elements:   Location:  generalized. Quality:  acute. Severity:  severe. Timing:  constant. Duration:  few days. Context:  stressors.  Past Medical History:  Past Medical History  Diagnosis Date  . Coronary artery disease   . Hypertension   . Hypercholesteremia   . Kidney stone   . Tobacco use disorder   . Hx of CABG   . COPD (chronic obstructive pulmonary disease)   . Bipolar disorder (manic depression)     Past  Surgical History  Procedure Laterality Date  . Coronary artery bypass graft    . Kidney stone surgery    . Appendectomy    . Lower extremity angiogram N/A 08/15/2012    Procedure: LOWER EXTREMITY ANGIOGRAM with possible PTA /stent;  Surgeon: Jettie Booze, MD;  Location: [facility] CATH LAB;  Service: Cardiovascular;  Laterality: N/A;  . Abdominal angiogram  08/15/2012    Procedure: ABDOMINAL ANGIOGRAM;  Surgeon: Jettie Booze, MD;  Location: [facility] CATH LAB;  Service: Cardiovascular;;  . Cardioversion N/A 08/28/2012    Procedure: Pseudo Compression;  Surgeon: Angelia Mould, MD;  Location: [facility] CATH LAB;  Service: Cardiovascular;  Laterality: N/A;  . Cystoscopy with retrograde pyelogram, ureteroscopy and stent placement Right 12/31/2014    Procedure: CYSTOSCOPY  RIGHT URETEROSCOPY WITH STONE RETREIVAL RIGHT RETROGRADE PYELOGRAM;  Surgeon: Cleon Gustin, MD;  Location: [facility] ORS;  Service: Urology;  Laterality: Right;   Family History:  Family History  Problem Relation Age of Onset  . Heart disease Father   . Hyperlipidemia Father   . Heart disease Maternal Grandmother    Social History:  History  Alcohol Use No    Comment: STATES HE QUIT 06/2013 ATTENDS DAILY AA MEETINGS     History  Drug Use No    Social History   Social History  . Marital Status: Divorced    Spouse Name: N/A  . Number of Children: N/A  . Years of Education: N/A   Social History Main Topics  . Smoking status: Current Every Day Smoker --  1.00 packs/day for 31 years  . Smokeless tobacco: Former Systems developer    Quit date: 04/27/2013  . Alcohol Use: No     Comment: STATES HE QUIT 06/2013 ATTENDS DAILY AA MEETINGS  . Drug Use: No  . Sexual Activity: Yes    Birth Control/ Protection: Condom   Other Topics Concern  . None   Social History Narrative   Additional Social History:    Pain Medications: see med list Prescriptions: see med list Over the Counter: see med list History of alcohol / drug use?:  Yes Longest period of sobriety (when/how long): sober from alcohol since 04/2013 Negative Consequences of Use:  (na-pt denies currently) Withdrawal Symptoms:  (na) Name of Substance 1: Cocaine 1 - Age of First Use: unk 1 - Amount (size/oz): 2 lines 1 - Frequency: once 1 - Duration: once 1 - Last Use / Amount: few days ago - 2 lines                    Allergies:  No Known Allergies  Labs:  Results for orders placed or performed during the hospital encounter of 02/08/15 (from the past 48 hour(s))  Ethanol     Status: None   Collection Time: 02/08/15  7:46 AM  Result Value Ref Range   Alcohol, Ethyl (B) <5 <5 mg/dL    Comment:        LOWEST DETECTABLE LIMIT FOR SERUM ALCOHOL IS 5 mg/dL FOR MEDICAL PURPOSES ONLY   CBC with Differential     Status: None   Collection Time: 02/08/15  7:46 AM  Result Value Ref Range   WBC 6.7 4.0 - 10.5 K/uL   RBC 4.41 4.22 - 5.81 MIL/uL   Hemoglobin 14.6 13.0 - 17.0 g/dL   HCT 43.5 39.0 - 52.0 %   MCV 98.6 78.0 - 100.0 fL   MCH 33.1 26.0 - 34.0 pg   MCHC 33.6 30.0 - 36.0 g/dL   RDW 13.9 11.5 - 15.5 %   Platelets 251 150 - 400 K/uL   Neutrophils Relative % 63 %   Neutro Abs 4.2 1.7 - 7.7 K/uL   Lymphocytes Relative 19 %   Lymphs Abs 1.3 0.7 - 4.0 K/uL   Monocytes Relative 11 %   Monocytes Absolute 0.7 0.1 - 1.0 K/uL   Eosinophils Relative 7 %   Eosinophils Absolute 0.5 0.0 - 0.7 K/uL   Basophils Relative 0 %   Basophils Absolute 0.0 0.0 - 0.1 K/uL  Comprehensive metabolic panel     Status: Abnormal   Collection Time: 02/08/15  7:46 AM  Result Value Ref Range   Sodium 138 135 - 145 mmol/L   Potassium 3.7 3.5 - 5.1 mmol/L   Chloride 106 101 - 111 mmol/L   CO2 24 22 - 32 mmol/L   Glucose, Bld 102 (H) 65 - 99 mg/dL   BUN 17 6 - 20 mg/dL   Creatinine, Ser 0.93 0.61 - 1.24 mg/dL   Calcium 8.9 8.9 - 10.3 mg/dL   Total Protein 6.7 6.5 - 8.1 g/dL   Albumin 3.7 3.5 - 5.0 g/dL   AST 30 15 - 41 U/L   ALT 23 17 - 63 U/L   Alkaline  Phosphatase 70 38 - 126 U/L   Total Bilirubin 0.4 0.3 - 1.2 mg/dL   GFR calc non Af Amer >60 >60 mL/min   GFR calc Af Amer >60 >60 mL/min    Comment: (NOTE) The eGFR has been calculated using the CKD  EPI equation. This calculation has not been validated in all clinical situations. eGFR's persistently <60 mL/min signify possible Chronic Kidney Disease.    Anion gap 8 5 - 15  Brain natriuretic peptide     Status: None   Collection Time: 02/08/15  7:46 AM  Result Value Ref Range   B Natriuretic Peptide 27.6 0.0 - 100.0 pg/mL  I-Stat Troponin, ED - 0, 3, 6 hours (not at [facility])     Status: None   Collection Time: 02/08/15  8:16 AM  Result Value Ref Range   Troponin i, poc 0.01 0.00 - 0.08 ng/mL   Comment 3            Comment: Due to the release kinetics of cTnI, a negative result within the first hours of the onset of symptoms does not rule out myocardial infarction with certainty. If myocardial infarction is still suspected, repeat the test at appropriate intervals.   Urine rapid drug screen (hosp performed)not at [facility]     Status: Abnormal   Collection Time: 02/08/15 11:10 AM  Result Value Ref Range   Opiates NONE DETECTED NONE DETECTED   Cocaine POSITIVE (A) NONE DETECTED   Benzodiazepines NONE DETECTED NONE DETECTED   Amphetamines NONE DETECTED NONE DETECTED   Tetrahydrocannabinol NONE DETECTED NONE DETECTED   Barbiturates NONE DETECTED NONE DETECTED    Comment:        DRUG SCREEN FOR MEDICAL PURPOSES ONLY.  IF CONFIRMATION IS NEEDED FOR ANY PURPOSE, NOTIFY LAB WITHIN 5 DAYS.        LOWEST DETECTABLE LIMITS FOR URINE DRUG SCREEN Drug Class       Cutoff (ng/mL) Amphetamine      1000 Barbiturate      200 Benzodiazepine   546 Tricyclics       503 Opiates          300 Cocaine          300 THC              50   Urinalysis, Routine w reflex microscopic (not at [facility])     Status: Abnormal   Collection Time: 02/08/15 11:10 AM  Result Value Ref Range   Color, Urine YELLOW  YELLOW   APPearance CLOUDY (A) CLEAR   Specific Gravity, Urine 1.017 1.005 - 1.030   pH 6.5 5.0 - 8.0   Glucose, UA NEGATIVE NEGATIVE mg/dL   Hgb urine dipstick NEGATIVE NEGATIVE   Bilirubin Urine NEGATIVE NEGATIVE   Ketones, ur NEGATIVE NEGATIVE mg/dL   Protein, ur NEGATIVE NEGATIVE mg/dL   Urobilinogen, UA 0.2 0.0 - 1.0 mg/dL   Nitrite NEGATIVE NEGATIVE   Leukocytes, UA NEGATIVE NEGATIVE    Comment: MICROSCOPIC NOT DONE ON URINES WITH NEGATIVE PROTEIN, BLOOD, LEUKOCYTES, NITRITE, OR GLUCOSE <1000 mg/dL.  I-Stat Troponin, ED - 0, 3, 6 hours (not at [facility])     Status: None   Collection Time: 02/08/15  1:17 PM  Result Value Ref Range   Troponin i, poc 0.01 0.00 - 0.08 ng/mL   Comment 3            Comment: Due to the release kinetics of cTnI, a negative result within the first hours of the onset of symptoms does not rule out myocardial infarction with certainty. If myocardial infarction is still suspected, repeat the test at appropriate intervals.     Vitals: Blood pressure 161/85, pulse 93, temperature 98.9 F (37.2 C), temperature source Oral, resp. rate 20, height $RemoveBe'5\' 9"'cyjgtUngg$  (1.753 m), weight 102.059  kg (225 lb), SpO2 96 %.  Risk to Self: Suicidal Ideation: Yes-Currently Present Suicidal Intent: Yes-Currently Present Is patient at risk for suicide?: Yes Suicidal Plan?: Yes-Currently Present Specify Current Suicidal Plan: to overdose on heroin Access to Means: Yes Specify Access to Suicidal Means: can access drugs What has been your use of drugs/alcohol within the last 12 months?: pt has been sober since 2014, used 2 lines of cocaine a few days ago How many times?: 0 (Has had SI in past) Other Self Harm Risks: na-pt denies Triggers for Past Attempts: None known Intentional Self Injurious Behavior: None Risk to Others: Homicidal Ideation: No Thoughts of Harm to Others: No Current Homicidal Intent: No Current Homicidal Plan: No Access to Homicidal Means: No Identified Victim:  na-pt denies History of harm to others?: No Assessment of Violence: None Noted Violent Behavior Description: na-pt cooperative Does patient have access to weapons?: No Criminal Charges Pending?: No Does patient have a court date: No Prior Inpatient Therapy: Prior Inpatient Therapy: Yes Prior Therapy Dates: 2010 (2 x) [facility]ilty[facility]Provider[facility] Reason for Treatment: detox Prior Outpatient Therapy: Prior Outpatient Therapy: Yes Prior Therapy Dates: Current and in recent past Prior Therapy Facilty/Provider(s): [facility] [place] [place] Reason for Treatment: Med mgnt Does patient have an ACCT team?: No Does patient have Intensive In-House Services?  : No Does patient have [facility] services? : No Does patient have P4CC services?: No  Current Facility-Administered Medications  Medication Dose Route Frequency Provider Last Rate Last Dose  . albuterol (PROVENTIL HFA;VENTOLIN HFA) 108 (90 BASE) MCG/ACT inhaler 2 puff  2 puff Inhalation Q4H PRN Gareth Morgan, MD      . aspirin EC tablet 81 mg  81 mg Oral Daily Gareth Morgan, MD      . gabapentin (NEURONTIN) capsule 100 mg  100 mg Oral TID Gareth Morgan, MD      . ibuprofen (ADVIL,MOTRIN) tablet 600 mg  600 mg Oral Q6H PRN Gareth Morgan, MD      . lurasidone (LATUDA) tablet 80 mg  80 mg Oral QHS Dreon Pineda      . metoprolol tartrate (LOPRESSOR) tablet 50 mg  50 mg Oral BID Gareth Morgan, MD      . Derrill Memo ON 02/09/2015] predniSONE (DELTASONE) tablet 40 mg  40 mg Oral Q breakfast Gareth Morgan, MD      . simvastatin (ZOCOR) tablet 40 mg  40 mg Oral QHS Gareth Morgan, MD      . traZODone (DESYREL) tablet 50 mg  50 mg Oral QHS PRN Breckan Cafiero       Current Outpatient Prescriptions  Medication Sig Dispense Refill  . aspirin 325 MG EC tablet Take 325 mg by mouth daily.    Marland Kitchen gabapentin (NEURONTIN) 100 MG capsule Take 100 mg by mouth 3 (three) times daily.    Marland Kitchen ibuprofen (ADVIL,MOTRIN) 600 MG tablet Take 1  tablet (600 mg total) by mouth every 6 (six) hours as needed. (Patient taking differently: Take 1,200 mg by mouth every 6 (six) hours as needed for moderate pain. ) 30 tablet 0  . lurasidone (LATUDA) 80 MG TABS tablet Take 80 mg by mouth daily with breakfast.    . metoprolol (LOPRESSOR) 50 MG tablet Take 50 mg by mouth daily.     . Multiple Vitamin (MULTIVITAMIN WITH MINERALS) TABS tablet Take 1 tablet by mouth daily.    . simvastatin (ZOCOR) 40 MG tablet Take 1 tablet (40 mg total) by mouth at bedtime. 90 tablet 3  .  aspirin 81 MG tablet Take 1 tablet (81 mg total) by mouth daily. (Patient not taking: Reported on 02/08/2015) 30 tablet 0  . diazepam (VALIUM) 5 MG tablet Take 1 tablet (5 mg total) by mouth every 6 (six) hours as needed for muscle spasms. (Patient not taking: Reported on 12/31/2014) 20 tablet 0  . losartan (COZAAR) 50 MG tablet Take 1 tablet (50 mg total) by mouth daily. (Patient not taking: Reported on 07/06/2014) 90 tablet 3  . ondansetron (ZOFRAN ODT) 8 MG disintegrating tablet Take 1 tablet (8 mg total) by mouth every 8 (eight) hours as needed for nausea. (Patient not taking: Reported on 02/08/2015) 20 tablet 0  . oxyCODONE-acetaminophen (PERCOCET) 5-325 MG per tablet Take 1-2 tablets by mouth every 4 (four) hours as needed. (Patient not taking: Reported on 02/08/2015) 15 tablet 0  . tamsulosin (FLOMAX) 0.4 MG CAPS capsule Take 1 capsule (0.4 mg total) by mouth daily. (Patient not taking: Reported on 02/08/2015) 10 capsule 0    Musculoskeletal: Strength & Muscle Tone: within normal limits Gait & Station: normal Patient leans: NA  Psychiatric Specialty Exam: Physical Exam  Review of Systems  Constitutional: Negative.   HENT: Negative.   Eyes: Negative.   Respiratory: Negative.   Cardiovascular: Negative.   Gastrointestinal: Negative.   Genitourinary: Negative.   Musculoskeletal: Negative.   Skin: Negative.   Neurological: Negative.   Endo/Heme/Allergies: Negative.    Psychiatric/Behavioral: Positive for depression, suicidal ideas and substance abuse. The patient is nervous/anxious.     Blood pressure 161/85, pulse 93, temperature 98.9 F (37.2 C), temperature source Oral, resp. rate 20, height $RemoveBe'5\' 9"'XPRZIhWuH$  (1.753 m), weight 102.059 kg (225 lb), SpO2 96 %.Body mass index is 33.21 kg/(m^2).  General Appearance: Casual  Eye Contact::  Fair  Speech:  Normal Rate  Volume:  Normal  Mood:  Anxious and Depressed  Affect:  Congruent  Thought Process:  Coherent  Orientation:  Full (Time, Place, and Person)  Thought Content:  Rumination  Suicidal Thoughts:  Yes.  with intent/plan  Homicidal Thoughts:  No  Memory:  Immediate;   Fair Recent;   Fair Remote;   Fair  Judgement:  Fair  Insight:  Fair  Psychomotor Activity:  Decreased  Concentration:  Fair  Recall:  [employer] [place]  Language: Fair  Akathisia:  No  Handed:  Right  AIMS (if indicated):     Assets:  Housing Leisure Time Physical Health Resilience Social Support  ADL's:  Intact  Cognition: WNL  Sleep:      Medical Decision Making: Review of Psycho-Social Stressors (1), Review or order clinical lab tests (1) and Review of Medication Regimen & Side Effects (2)  Treatment Plan Summary: Daily contact with patient to assess and evaluate symptoms and progress in treatment, Medication management and Plan bipolar affective disorder, depression, severe  -Crisis stabilization -Medication management:  Restart home medication of Latuda 80 mg daily, start gabapentin 100 mg TID for mood/anxiety -Individual and substance abuse counseling  Plan:  Recommend psychiatric Inpatient admission when medically cleared. Disposition: Admit to inpatient unit for stabilization  Waylan Boga, PMH-NP 02/08/2015 4:32 PM Patient seen face-to-face for psychiatric evaluation, chart reviewed and case discussed with the physician extender and developed treatment plan. Reviewed the information documented and  agree with the treatment plan. Corena Pilgrim, MD

## 2015-02-08 NOTE — ED Notes (Signed)
Patient has a flat affect on approach. Reports increased depression and was having some SI as the reason that he is here today. Given meal and put tv on channel that he requested. Oriented to where the bathrooms are and to ask if needing any hygiene items or towels.

## 2015-02-08 NOTE — Progress Notes (Signed)
.   Patient listed as not having  a pcp or insurance living in [facility].  [facility] provided patient with contact information to [facility], informed patient of services there.  EDCM also provided patient with list of pcps who accept self pay patients, list of discount pharmacies and websites needymeds.org and GoodRX.com for medication assistance, phone number to inquire about the orange card, phone number to inquire about Mediciad, phone number to inquire about the Affordable Care Act, financial resources in the community such as local churches, [employer], urban ministries, and dental assistance for uninsured patients.  Patient asleep at this time.  EDCM left resources on bedside table.  EDCM did not disturb patient at this time.    No further EDCM needs at this time.

## 2015-02-08 NOTE — Progress Notes (Signed)
Pt being reviewed for admission at [facility].  02/08/2015 Cheryl Flash, MS, NCC, LPCA Therapeutic Triage Specialist

## 2015-02-08 NOTE — ED Notes (Addendum)
Patient has 1 watch, 1 hat, 2 shirts, 2 pair of shorts, 1 pair of sneaker, 2 pair of socks, one wallet (no cash, a prepaid visa, Engineering ge[employer] card. Ss card), eyeglasses, set of keys, lighter, flip cell phone and one belt.

## 2015-02-08 NOTE — ED Provider Notes (Signed)
CSN: [id number]     Arrival date & time 02/08/15  4098 History   First MD Initiated Contact with Patient 02/08/15 [phone]     Chief Complaint  Patient presents with  . Shortness of Breath  . Suicidal     (Consider location/radiation/quality/duration/timing/severity/associated sxs/prior Treatment) Patient is a 44 y.o. male presenting with shortness of breath.  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 "don't like my life much before and I don't like my life much now". Thoughts of hurting self "every day, for months".  Has a plan---reports he has never done heroin, and plan would be to "give myself a big dose and OD"  Risk factors comment:  Hx CAD, COPD,htn, hlpd   Past Medical History  Diagnosis Date  . Coronary artery disease   . Hypertension   . Hypercholesteremia   . Kidney stone   . Tobacco use disorder   . Hx of CABG   . COPD (chronic obstructive pulmonary disease)   . Bipolar disorder (manic depression)    Past Surgical History  Procedure Laterality Date  . Coronary artery bypass graft    . Kidney stone surgery    . Appendectomy    . Lower extremity angiogram N/A 08/15/2012    Procedure: LOWER EXTREMITY ANGIOGRAM with possible PTA /stent;  Surgeon: Corky Crafts, MD;  Location: [facility] CATH LAB;  Service: Cardiovascular;  Laterality: N/A;  . Abdominal angiogram  08/15/2012    Procedure:  ABDOMINAL ANGIOGRAM;  Surgeon: Corky Crafts, MD;  Location: [facility] CATH LAB;  Service: Cardiovascular;;  . Cardioversion N/A 08/28/2012    Procedure: Pseudo Compression;  Surgeon: Chuck Hint, MD;  Location: [facility] CATH LAB;  Service: Cardiovascular;  Laterality: N/A;  . Cystoscopy with retrograde pyelogram, ureteroscopy and stent placement Right 12/31/2014    Procedure: CYSTOSCOPY  RIGHT URETEROSCOPY WITH STONE RETREIVAL RIGHT RETROGRADE PYELOGRAM;  Surgeon: Malen Gauze, MD;  Location: [facility] ORS;  Service: Urology;  Laterality: Right;   Family History  Problem Relation Age of Onset  . Heart disease Father   . Hyperlipidemia Father   . Heart disease Maternal Grandmother    Social History  Substance Use Topics  . Smoking status: Current Every Day Smoker -- 1.00 packs/day for 31 years  . Smokeless tobacco: Former Neurosurgeon    Quit date: 04/27/2013  . Alcohol Use: No     Comment: STATES HE QUIT 06/2013 ATTENDS DAILY AA MEETINGS    Review of Systems  Constitutional: Negative for fever (subjective yesterday am).  HENT: Negative for sore throat.   Eyes: Negative for visual disturbance.  Respiratory: Positive for cough, sputum production (white sputum, which is similar), shortness of breath and wheezing (a couple days). Negative for hemoptysis.   Cardiovascular: Negative for chest pain (since had CABG has had tightness, just started getting heart medicines, CP off and on all the time every day.).  Gastrointestinal: Negative for vomiting and abdominal pain.  Genitourinary: Negative for difficulty  urinating.  Musculoskeletal: Negative for back pain and neck stiffness.  Skin: Negative for rash.  Neurological: Negative for syncope and headaches.  Psychiatric/Behavioral: Positive for suicidal ideas, sleep disturbance and dysphoric mood.      Allergies  Review of patient's allergies indicates no known allergies.  Home Medications   Prior to Admission medications   Medication Sig  Start Date End Date Taking? Authorizing Carisha Kantor  aspirin 325 MG EC tablet Take 325 mg by mouth daily.   Yes Historical Nelly Scriven, MD  gabapentin (NEURONTIN) 100 MG capsule Take 100 mg by mouth 3 (three) times daily.   Yes Historical Candy Leverett, MD  ibuprofen (ADVIL,MOTRIN) 600 MG tablet Take 1 tablet (600 mg total) by mouth every 6 (six) hours as needed. Patient taking differently: Take 1,200 mg by mouth every 6 (six) hours as needed for moderate pain.  12/29/14  Yes Ankit Rhunette Croft, MD  lurasidone (LATUDA) 80 MG TABS tablet Take 80 mg by mouth daily with breakfast.   Yes Historical Elvera Almario, MD  metoprolol (LOPRESSOR) 50 MG tablet Take 50 mg by mouth daily.    Yes Historical Weslynn Ke, MD  Multiple Vitamin (MULTIVITAMIN WITH MINERALS) TABS tablet Take 1 tablet by mouth daily.   Yes Historical Oanh Devivo, MD  simvastatin (ZOCOR) 40 MG tablet Take 1 tablet (40 mg total) by mouth at bedtime. 02/25/14  Yes Gaylord Shih, MD  aspirin 81 MG tablet Take 1 tablet (81 mg total) by mouth daily. Patient not taking: Reported on 02/08/2015 02/25/14   Gaylord Shih, MD  diazepam (VALIUM) 5 MG tablet Take 1 tablet (5 mg total) by mouth every 6 (six) hours as needed for muscle spasms. Patient not taking: Reported on 12/31/2014 09/07/13   Lorre Nick, MD  losartan (COZAAR) 50 MG tablet Take 1 tablet (50 mg total) by mouth daily. Patient not taking: Reported on 07/06/2014 02/25/14   Gaylord Shih, MD  ondansetron (ZOFRAN ODT) 8 MG disintegrating tablet Take 1 tablet (8 mg total) by mouth every 8 (eight) hours as needed for nausea. Patient not taking: Reported on 02/08/2015 12/29/14   Derwood Kaplan, MD  oxyCODONE-acetaminophen (PERCOCET) 5-325 MG per tablet Take 1-2 tablets by mouth every 4 (four) hours as needed. Patient not taking: Reported on 02/08/2015 01/01/15   Malen Gauze, MD  tamsulosin (FLOMAX) 0.4 MG CAPS capsule Take 1 capsule (0.4 mg total) by mouth daily. Patient not taking: Reported on 02/08/2015 12/29/14    Derwood Kaplan, MD   BP 162/82 mmHg  Pulse 86  Temp(Src) 98.3 F (36.8 C) (Oral)  Resp 18  Ht  (1.753 m)  Wt 225 lb (102.059 kg)  BMI 33.21 kg/m2  SpO2 93% Physical Exam  Constitutional: He is oriented to person, place, and time. He appears well-developed and well-nourished. No distress.  HENT:  Head: Normocephalic and atraumatic.  Eyes: Conjunctivae and EOM are normal.  Neck: Normal range of motion.  Cardiovascular: Normal rate, regular rhythm, normal heart sounds and intact distal pulses.  Exam reveals no gallop and no friction rub.   No murmur heard. Pulmonary/Chest: Effort normal. No respiratory distress. He has wheezes. He has no rales.  Abdominal: Soft. He exhibits no distension. There is no tenderness. There is no guarding.  Musculoskeletal: He exhibits no edema.  Neurological: He is alert and oriented to person, place, and time.  Skin: Skin is warm and dry. He is not diaphoretic.  Nursing note and vitals reviewed.   ED Course  Procedures (including critical care time) Labs Review Labs  Reviewed  URINE RAPID DRUG SCREEN, HOSP PERFORMED - Abnormal; Notable for the following:    Cocaine POSITIVE (*)    All other components within normal limits  COMPREHENSIVE METABOLIC PANEL - Abnormal; Notable for the following:    Glucose, Bld 102 (*)    All other components within normal limits  URINALYSIS, ROUTINE W REFLEX MICROSCOPIC (NOT AT [facility]) - Abnormal; Notable for the following:    APPearance CLOUDY (*)    All other components within normal limits  ETHANOL  CBC WITH DIFFERENTIAL/PLATELET  BRAIN NATRIURETIC PEPTIDE  I-STAT TROPOININ, ED  I-STAT TROPOININ, ED  I-STAT TROPOININ, ED  Rosezena Sensor, ED    Imaging Review Dg Chest 2 View  02/08/2015   CLINICAL DATA:  Shortness of breath and cough  EXAM: CHEST  2 VIEW  COMPARISON:  December 21, 2014  FINDINGS: There is no edema or consolidation. Heart size and pulmonary vascularity are normal. No adenopathy. Patient is  status post coronary artery bypass grafting. No bone lesions.  IMPRESSION: No edema or consolidation.   Electronically Signed   By: Bretta Bang III M.D.   On: 02/08/2015 09:28   I have personally reviewed and evaluated these images and lab results as part of my medical decision-making.   EKG Interpretation   Date/Time:  Monday February 08 2015 08:01:49 EDT Ventricular Rate:  69 PR Interval:  198 QRS Duration: 101 QT Interval:  435 QTC Calculation: 466 R Axis:   21 Text Interpretation:  Sinus rhythm Probable anteroseptal infarct, old Poor  data quality No significant change since last tracing Confirmed by KNAPP   MD-J, JON ([id number]) on 02/08/2015 8:10:39 AM      MDM   Final diagnoses:  COPD exacerbation  Suicidal ideation  Depression   44yo male with history of CAD s/p CABG 2012, COPD, hypertension, GERD, bipolar presents with concern of shortness of breath, cough, worsening depression with suicidal ideation.  Regarding patient's shortness of breath, history of worsening cough productive of white sputum, worsening wheezing, feel symptoms most likely represent a mild COPD exacerbation. An EKG was evaluated by me and showed normal sinus rhythm without acute ST changes consistent with ischemia or pericarditis.  A chest XR was done which shows no signs of pneumonia, pneumothorax, or heart failure.  Patient reports chronic daily chest pain, ever since he's had his CABG in 2012.  Delta troponins were completed and negative.   Given primary concern today depression/SI and SOB/cough feel most likely cause of symptoms is mild COPD exacerbation and recommend close follow up with cardiologist regarding daily chest tightness x years he describes.  For COPD exacerbation, patient was given 1 DuoNeb, prednisone with improvement of his shortness of breath. Recommended he continue prednisone for 4 days, and order placed for when necessary albuterol.  Regarding patient's suicidal ideation, TTS was  consulted, and psychiatry was consulted. Feel patient is appropriate for inpatient admission. He is under voluntary commitment, and is currently awaiting placement.  Home medications have been ordered, including prednisone and albuterol.    Alvira Monday, MD 02/08/15 [phone]

## 2015-02-08 NOTE — ED Notes (Signed)
Tele-psych at bedside.

## 2015-02-08 NOTE — ED Notes (Signed)
Patient alert and verbal

## 2015-02-09 ENCOUNTER — Inpatient Hospital Stay
Admission: EM | Admit: 2015-02-09 | Discharge: 2015-02-12 | DRG: 885 | Disposition: A | Discharge status: Home / Self Care | Payer: No Typology Code available for payment source | Source: Other Acute Inpatient Hospital | Attending: Psychiatry | Admitting: Psychiatry

## 2015-02-09 DIAGNOSIS — I739 Peripheral vascular disease, unspecified: Secondary | ICD-10-CM | POA: Diagnosis present

## 2015-02-09 DIAGNOSIS — F1590 Other stimulant use, unspecified, uncomplicated: Secondary | ICD-10-CM

## 2015-02-09 DIAGNOSIS — N400 Enlarged prostate without lower urinary tract symptoms: Secondary | ICD-10-CM | POA: Diagnosis present

## 2015-02-09 DIAGNOSIS — Z915 Personal history of self-harm: Secondary | ICD-10-CM | POA: Diagnosis not present

## 2015-02-09 DIAGNOSIS — I252 Old myocardial infarction: Secondary | ICD-10-CM | POA: Diagnosis not present

## 2015-02-09 DIAGNOSIS — I2510 Atherosclerotic heart disease of native coronary artery without angina pectoris: Secondary | ICD-10-CM | POA: Diagnosis present

## 2015-02-09 DIAGNOSIS — K219 Gastro-esophageal reflux disease without esophagitis: Secondary | ICD-10-CM | POA: Diagnosis present

## 2015-02-09 DIAGNOSIS — Z87442 Personal history of urinary calculi: Secondary | ICD-10-CM

## 2015-02-09 DIAGNOSIS — Z9049 Acquired absence of other specified parts of digestive tract: Secondary | ICD-10-CM | POA: Diagnosis present

## 2015-02-09 DIAGNOSIS — Z7982 Long term (current) use of aspirin: Secondary | ICD-10-CM

## 2015-02-09 DIAGNOSIS — I25810 Atherosclerosis of coronary artery bypass graft(s) without angina pectoris: Secondary | ICD-10-CM | POA: Diagnosis present

## 2015-02-09 DIAGNOSIS — I10 Essential (primary) hypertension: Secondary | ICD-10-CM | POA: Diagnosis present

## 2015-02-09 DIAGNOSIS — Z8249 Family history of ischemic heart disease and other diseases of the circulatory system: Secondary | ICD-10-CM

## 2015-02-09 DIAGNOSIS — J449 Chronic obstructive pulmonary disease, unspecified: Secondary | ICD-10-CM | POA: Diagnosis present

## 2015-02-09 DIAGNOSIS — F1021 Alcohol dependence, in remission: Secondary | ICD-10-CM | POA: Diagnosis present

## 2015-02-09 DIAGNOSIS — F17200 Nicotine dependence, unspecified, uncomplicated: Secondary | ICD-10-CM

## 2015-02-09 DIAGNOSIS — F319 Bipolar disorder, unspecified: Secondary | ICD-10-CM | POA: Diagnosis present

## 2015-02-09 DIAGNOSIS — N201 Calculus of ureter: Secondary | ICD-10-CM | POA: Diagnosis present

## 2015-02-09 DIAGNOSIS — F1490 Cocaine use, unspecified, uncomplicated: Secondary | ICD-10-CM | POA: Diagnosis present

## 2015-02-09 DIAGNOSIS — Z9889 Other specified postprocedural states: Secondary | ICD-10-CM

## 2015-02-09 DIAGNOSIS — F314 Bipolar disorder, current episode depressed, severe, without psychotic features: Secondary | ICD-10-CM | POA: Diagnosis not present

## 2015-02-09 DIAGNOSIS — G4700 Insomnia, unspecified: Secondary | ICD-10-CM | POA: Diagnosis present

## 2015-02-09 DIAGNOSIS — Z79899 Other long term (current) drug therapy: Secondary | ICD-10-CM

## 2015-02-09 DIAGNOSIS — Z951 Presence of aortocoronary bypass graft: Secondary | ICD-10-CM | POA: Diagnosis not present

## 2015-02-09 DIAGNOSIS — E785 Hyperlipidemia, unspecified: Secondary | ICD-10-CM | POA: Diagnosis present

## 2015-02-09 DIAGNOSIS — F17210 Nicotine dependence, cigarettes, uncomplicated: Secondary | ICD-10-CM | POA: Diagnosis present

## 2015-02-09 DIAGNOSIS — F331 Major depressive disorder, recurrent, moderate: Secondary | ICD-10-CM | POA: Diagnosis present

## 2015-02-09 LAB — TSH: TSH: 0.55 u[IU]/mL (ref 0.350–4.500)

## 2015-02-09 MED ORDER — NICOTINE 21 MG/24HR TD PT24
21.0000 mg | MEDICATED_PATCH | Freq: Every day | TRANSDERMAL | Status: DC
  Administered 2015-02-09 – 2015-02-12 (×4): 21 mg via TRANSDERMAL
  Filled 2015-02-09 (×4): qty 1

## 2015-02-09 MED ORDER — ACETAMINOPHEN 325 MG PO TABS
650.0000 mg | ORAL_TABLET | Freq: Four times a day (QID) | ORAL | Status: DC | PRN
  Administered 2015-02-09: 650 mg via ORAL
  Filled 2015-02-09: qty 2

## 2015-02-09 MED ORDER — TAMSULOSIN HCL 0.4 MG PO CAPS
0.4000 mg | ORAL_CAPSULE | Freq: Every day | ORAL | Status: DC
  Administered 2015-02-09 – 2015-02-12 (×4): 0.4 mg via ORAL
  Filled 2015-02-09 (×4): qty 1

## 2015-02-09 MED ORDER — ASPIRIN 81 MG PO CHEW
81.0000 mg | CHEWABLE_TABLET | Freq: Every day | ORAL | Status: DC
  Administered 2015-02-09 – 2015-02-12 (×4): 81 mg via ORAL
  Filled 2015-02-09 (×4): qty 1

## 2015-02-09 MED ORDER — GABAPENTIN 100 MG PO CAPS
100.0000 mg | ORAL_CAPSULE | Freq: Three times a day (TID) | ORAL | Status: DC
  Administered 2015-02-09 – 2015-02-10 (×3): 100 mg via ORAL
  Filled 2015-02-09 (×3): qty 1

## 2015-02-09 MED ORDER — PREDNISONE 10 MG PO TABS
10.0000 mg | ORAL_TABLET | Freq: Every day | ORAL | Status: DC
  Filled 2015-02-09: qty 1

## 2015-02-09 MED ORDER — FLUOXETINE HCL 10 MG PO CAPS
10.0000 mg | ORAL_CAPSULE | Freq: Every day | ORAL | Status: DC
  Administered 2015-02-09 – 2015-02-12 (×4): 10 mg via ORAL
  Filled 2015-02-09 (×4): qty 1

## 2015-02-09 MED ORDER — HALOPERIDOL 0.5 MG PO TABS
0.5000 mg | ORAL_TABLET | Freq: Three times a day (TID) | ORAL | Status: DC
  Administered 2015-02-09 – 2015-02-12 (×9): 0.5 mg via ORAL
  Filled 2015-02-09 (×9): qty 1

## 2015-02-09 MED ORDER — IBUPROFEN 600 MG PO TABS
600.0000 mg | ORAL_TABLET | Freq: Four times a day (QID) | ORAL | Status: DC | PRN
  Administered 2015-02-10 – 2015-02-11 (×2): 600 mg via ORAL
  Filled 2015-02-09 (×2): qty 1

## 2015-02-09 MED ORDER — METOPROLOL TARTRATE 25 MG PO TABS
50.0000 mg | ORAL_TABLET | Freq: Every day | ORAL | Status: DC
  Administered 2015-02-09 – 2015-02-12 (×4): 50 mg via ORAL
  Filled 2015-02-09 (×4): qty 2

## 2015-02-09 MED ORDER — HYDROXYZINE HCL 25 MG PO TABS
50.0000 mg | ORAL_TABLET | Freq: Once | ORAL | Status: AC
  Administered 2015-02-09: 50 mg via ORAL
  Filled 2015-02-09: qty 2

## 2015-02-09 MED ORDER — PREDNISONE 20 MG PO TABS
40.0000 mg | ORAL_TABLET | Freq: Once | ORAL | Status: DC
Start: 2015-02-09 — End: 2015-02-12
  Filled 2015-02-09: qty 2

## 2015-02-09 MED ORDER — TRAZODONE HCL 100 MG PO TABS
100.0000 mg | ORAL_TABLET | Freq: Every evening | ORAL | Status: DC | PRN
Start: 2015-02-09 — End: 2015-02-12
  Administered 2015-02-09 – 2015-02-11 (×3): 100 mg via ORAL
  Filled 2015-02-09 (×3): qty 1

## 2015-02-09 MED ORDER — MOMETASONE FURO-FORMOTEROL FUM 100-5 MCG/ACT IN AERO
2.0000 | INHALATION_SPRAY | Freq: Two times a day (BID) | RESPIRATORY_TRACT | Status: DC
Start: 2015-02-09 — End: 2015-02-12
  Administered 2015-02-09 – 2015-02-12 (×6): 2 via RESPIRATORY_TRACT
  Filled 2015-02-09: qty 8.8

## 2015-02-09 MED ORDER — ALBUTEROL SULFATE HFA 108 (90 BASE) MCG/ACT IN AERS
2.0000 | INHALATION_SPRAY | Freq: Four times a day (QID) | RESPIRATORY_TRACT | Status: DC | PRN
  Filled 2015-02-09: qty 6.7

## 2015-02-09 MED ORDER — PREDNISONE 20 MG PO TABS
20.0000 mg | ORAL_TABLET | Freq: Every day | ORAL | Status: DC
  Administered 2015-02-10: 20 mg via ORAL
  Filled 2015-02-09 (×2): qty 1

## 2015-02-09 MED ORDER — MAGNESIUM HYDROXIDE 400 MG/5ML PO SUSP: 30.0000 mL | Freq: Every day | ORAL | Status: DC | PRN

## 2015-02-09 MED ORDER — ALUM & MAG HYDROXIDE-SIMETH 200-200-20 MG/5ML PO SUSP: 30.0000 mL | ORAL | Status: DC | PRN

## 2015-02-09 MED ORDER — SIMVASTATIN 20 MG PO TABS
40.0000 mg | ORAL_TABLET | Freq: Every day | ORAL | Status: DC
  Administered 2015-02-09 – 2015-02-11 (×3): 40 mg via ORAL
  Filled 2015-02-09 (×3): qty 2
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
## 2015-02-09 NOTE — Tx Team (Signed)
Initial Interdisciplinary Treatment Plan   PATIENT STRESSORS: Financial difficulties Health problems   PATIENT STRENGTHS: Ability for insight Average or above average intelligence Capable of independent living Supportive family/friends   PROBLEM LIST: Problem List/Patient Goals Date to be addressed Date deferred Reason deferred Estimated date of resolution  Suicidal 02/09/2015     Depression 02/09/2015                                                DISCHARGE CRITERIA:  Ability to meet basic life and health needs Improved stabilization in mood, thinking, and/or behavior  PRELIMINARY DISCHARGE PLAN: Outpatient therapy Return to previous living arrangement  PATIENT/FAMIILY INVOLVEMENT: This treatment plan has been presented to and reviewed with the patient, Steven [name], and/or family member.  The patient and family have been given the opportunity to ask questions and make suggestions.  Polly Cobia 02/09/2015, 2:40 PM

## 2015-02-09 NOTE — [facility] Group Notes (Signed)
[facility] Group Notes:  (Nursing/MHT/Case Management/Adjunct)  Date:  02/09/2015  Time:  9:27 PM  Type of Therapy:  Group Therapy  Participation Level:  Active  Participation Quality:  Appropriate  Affect:  Appropriate  Cognitive:  Appropriate  Insight:  Appropriate  Engagement in Group:  Engaged  Modes of Intervention:  N/A  Summary of Progress/Problems:  Veva Holes 02/09/2015, 9:27 PM

## 2015-02-09 NOTE — Progress Notes (Signed)
D: Patient admitted to unit under the services of Ardyth Harps . Patient admitted for Depression and suicidal ideation . Patient is Bipolar. Voiced he was going to kill himself  With heroin  Overdose. Patient presents with COPD Hypertension, CABG . Patient lives in assistant living facility taking care of an uncle.  A: Patient searched with no contraband found, skin assessment done . Patient oriented to unit and call bells . Informed by nurse to come to station for any concerns . R: Patient verbalized understanding , voice no other concerns .

## 2015-02-09 NOTE — ED Notes (Addendum)
0330 am Received called from [facility] nurse [place] checking on status of patient. She stated that Dr. Ardyth Harps was going to be the admitting MD. I then called ED MD for EMTALA to be completed and once it was completed called Inetta Fermo to give report. Patient did not have order to be admitted at [facility] per Inetta Fermo. Virgina Evener at [phone] and informed him patient needed order. Spencer  PA then called at [phone] and informed order needed instructed to have accepting facility [facility]) to call on call doctor for order. Called [facility] and spoke to [employer] informed him that on call doctor would have to be called for order he said he would have receiving nurse Inetta Fermo to call on call doctor to obtain order for admission for patient. [place] to call back when order obtained.

## 2015-02-09 NOTE — Plan of Care (Signed)
Problem: Ineffective individual coping Goal: STG: Patient will remain free from self harm Outcome: Progressing Patient remains free from harm during this shift.

## 2015-02-09 NOTE — Progress Notes (Signed)
Recreation Therapy Notes  Date: 09.27.16 Time: 3:00 pm Location: Craft Room  Group Topic: Goal Setting  Goal Area(s) Addresses:  Patient will write at least one goal. Patient will write at least one obstacle.  Behavioral Response: Did not attend  Intervention: Recovery Goal Chart  Activity: Patients were instructed to make a goal chart listing goals, obstacles, the date they started working on their goals, and the date they have achieved their goals.   Education: LRT educated patients on healthy ways to celebrate reaching their goals.  Education Outcome: Patient did not attend group.  Clinical Observations/Feedback: Patient did not attend group.   Jacquelynn Cree, LRT/CTRS 02/09/2015 4:14 PM

## 2015-02-09 NOTE — [facility] Group Notes (Signed)
[facility] Group Notes:  (Nursing/MHT/Case Management/Adjunct)  Date:  02/09/2015  Time:  2:02 PM  Type of Therapy:  Psychoeducational Skills  Participation Level:  Did Not Attend   Lynelle Smoke [facility] 02/09/2015, 2:02 PM

## 2015-02-09 NOTE — [facility] Suicide Risk Assessment (Addendum)
[facility] Admission Suicide Risk Assessment   Nursing information obtained from:    Demographic factors:    Current Mental Status:    Loss Factors:    Historical Factors:    Risk Reduction Factors:    Total Time spent with patient: 1 hour Principal Problem: Bipolar affective disorder, current episode depressed Diagnosis:   Patient Active Problem List   Diagnosis Date Noted  . COPD (chronic obstructive pulmonary disease) [J44.9] 02/09/2015  . Tobacco use disorder [Z72.0] 02/09/2015  . Stimulant use disorder (cocaine) [F15.99] 02/09/2015  . Alcohol use disorder, severe, in sustained remission [F10.99] 02/09/2015  . Bipolar affective disorder, current episode depressed [F31.30] 02/08/2015  . Ureteral stone [N20.1] 12/31/2014  . Claudication of left lower extremity [I73.9] 08/14/2012  . CAD s/p CABG 2012 [facility] [I25.810] 11/28/2011  . Hyperlipidemia LDL goal <100 [E78.5] 03/13/2007  . Essential hypertension [I10] 03/13/2007  . GERD [K21.9] 03/13/2007     Continued Clinical Symptoms:  Alcohol Use Disorder Identification Test Final Score (AUDIT): 0 The "Alcohol Use Disorders Identification Test", Guidelines for Use in Primary Care, Second Edition.  World Science writer [facility]). Score between 0-7:  no or low risk or alcohol related problems. Score between 8-15:  moderate risk of alcohol related problems. Score between 16-19:  high risk of alcohol related problems. Score 20 or above:  warrants further diagnostic evaluation for alcohol dependence and treatment.   CLINICAL FACTORS:   Severe Anxiety and/or Agitation Depression:   Comorbid alcohol abuse/dependence Impulsivity Severe Alcohol/Substance Abuse/Dependencies Previous Psychiatric Diagnoses and Treatments Medical Diagnoses and Treatments/Surgeries   Psychiatric Specialty Exam: Physical Exam  ROS    COGNITIVE FEATURES THAT CONTRIBUTE TO RISK:  Closed-mindedness    SUICIDE RISK:   Moderate:  Frequent  suicidal ideation with limited intensity, and duration, some specificity in terms of plans, no associated intent, good self-control, limited dysphoria/symptomatology, some risk factors present, and identifiable protective factors, including available and accessible social support.  PLAN OF CARE: Admit to behavioral health  Medical Decision Making:  Established Problem, Worsening (2)  I certify that inpatient services furnished can reasonably be expected to improve the patient's condition.   Jimmy Footman 02/09/2015, 3:32 PM

## 2015-02-10 LAB — LIPID PANEL
CHOL/HDL RATIO: 4.9 ratio
Cholesterol: 220 mg/dL — ABNORMAL HIGH (ref 0–200)
HDL: 45 mg/dL (ref >40)
LDL CALC: 114 mg/dL — AB (ref 0–99)
Triglycerides: 307 mg/dL — ABNORMAL HIGH (ref <150)
VLDL: 61 mg/dL — ABNORMAL HIGH (ref 0–40)

## 2015-02-10 LAB — HEMOGLOBIN A1C: HEMOGLOBIN A1C: 5.2 % (ref 4.0–6.0)

## 2015-02-10 MED ORDER — DIVALPROEX SODIUM 250 MG PO DR TAB
250.0000 mg | DELAYED_RELEASE_TABLET | Freq: Three times a day (TID) | ORAL | Status: DC
  Administered 2015-02-10 – 2015-02-12 (×6): 250 mg via ORAL
  Filled 2015-02-10 (×6): qty 1

## 2015-02-10 MED ORDER — PREDNISONE 5 MG PO TABS
5.0000 mg | ORAL_TABLET | Freq: Every day | ORAL | Status: AC
Start: 2015-02-11 — End: 2015-02-12
  Administered 2015-02-11 – 2015-02-12 (×2): 5 mg via ORAL
  Filled 2015-02-10 (×3): qty 1

## 2015-02-10 NOTE — Plan of Care (Signed)
Problem: Ineffective individual coping Goal: LTG: Patient will report a decrease in negative feelings Outcome: Progressing Pt was able to report his feelings to the staff, pt agrees to continue to work on this goal.

## 2015-02-10 NOTE — [facility] Group Notes (Signed)
[facility] Group Notes:  (Nursing/MHT/Case Management/Adjunct)  Date:  02/10/2015  Time:  12:34 PM  Type of Therapy:  Psychoeducational Skills  Participation Level:  Minimal  Participation Quality:  Supportive  Affect:  Flat  Cognitive:  Appropriate  Insight:  Improving  Engagement in Group:  Limited  Modes of Intervention:  Clarification  Summary of Progress/Problems:  Steven [name] 02/10/2015, 12:34 PM

## 2015-02-10 NOTE — Progress Notes (Signed)
Pt's mood is improved this evening, pt is visible in milieu with active participation. Pt rated his depression as 2/10 and denies any SI/HI/VAH. Pt complained of pain, PRN pain medicine given for the concern; full relief obtained. No further concerns voiced, will continue to monitor for safety.

## 2015-02-10 NOTE — Plan of Care (Signed)
Problem: Ineffective individual coping Goal: STG: Patient will remain free from self harm Outcome: Progressing 15 minutes checks maintained..   Problem: Alteration in mood Goal: LTG-Patient reports reduction in suicidal thoughts (Patient reports reduction in suicidal thoughts and is able to verbalize a safety plan for whenever patient is feeling suicidal)  Outcome: Progressing Patient denies SI/HI.

## 2015-02-10 NOTE — Progress Notes (Deleted)
Recreation Therapy Notes  Date: 09.28.16 Time: 3:00 pm Location: Day Room  Group Topic: Self-esteem  Goal Area(s) Addresses:  Patient will write at least one positive trait. Patient will verbalize benefit of having a good self-esteem.  Behavioral Response: Arrived late, Attentive  Intervention: I Am  Activity: Patients were given a worksheet with the letter I on it and instructed to fill the letter with positive traits about themselves.  Education: LRT educated patients on ways they can increase their self-esteem.  Education Outcome: Acknowledges education/In group clarification offered/Needs additional education.   Clinical Observations/Feedback: Patient arrived to group at approximately 3:35 pm. LRT explained activity. Patient wrote positive traits down. Patient did not contribute to group discussion.  Jacquelynn Cree, LRT/CTRS 02/10/2015 4:29 PM

## 2015-02-10 NOTE — Plan of Care (Signed)
Problem: Ineffective individual coping Goal: LTG: Patient will report a decrease in negative feelings Outcome: Progressing Patient voice of improvement  Continue to have suicidal ideation no plan

## 2015-02-10 NOTE — Progress Notes (Signed)
[facility] MD Progress Note  02/10/2015 10:28 AM Steven [name]  MRN:  [id number] Subjective:  Patient reports feeling improvement in his mood however he still complains of feeling anxious and angry. He continues to request something for his mood swings. States that last night he had fair sleep about 6 hours. Denies major issues with appetite, energy or concentration. Denies side effects from medications. Denies having any physical complaints. Patient does not report suicidality, homicidality or psychosis. Once again had to be redirected as he was using profanity frequently. Patient requests to be discharged on Friday plans to return to stay with his uncle temporarily.  Per nursing: D: Pt denies SI/HI/AVH. Pt is alert and oriented x 4, affect is blunted , appears anxious and apprehensive , he is interacting with peers and staff appropriately.  A: Pt was offered support and encouragement. Pt was given scheduled medications. Pt was encouraged to attend groups. Q 15 minute checks were done for safety.  R:Pt attends groups and interacts well with peers and staff. Pt is taking medication. .Pt receptive to treatment and safety maintained on unit.  Principal Problem: Bipolar affective disorder, current episode depressed Diagnosis:   Patient Active Problem List   Diagnosis Date Noted  . COPD (chronic obstructive pulmonary disease) [J44.9] 02/09/2015  . Tobacco use disorder [Z72.0] 02/09/2015  . Stimulant use disorder (cocaine) [F15.99] 02/09/2015  . Alcohol use disorder, severe, in sustained remission [F10.99] 02/09/2015  . Bipolar affective disorder, current episode depressed [F31.30] 02/08/2015  . Ureteral stone [N20.1] 12/31/2014  . Claudication of left lower extremity [I73.9] 08/14/2012  . CAD s/p CABG 2012 [facility] [I25.810] 11/28/2011  . Hyperlipidemia LDL goal <100 [E78.5] 03/13/2007  . Essential hypertension [I10] 03/13/2007  . GERD [K21.9] 03/13/2007   Total Time spent with patient:  30 minutes  Past Psychiatric History:Patient states he was hospitalized around 2010 at behavioral health in [place] due to substance abuse issues. The patient stated he had a suicidal attempt back in 2009 when he cut his wrist patient has a scars on both of his wrists. Patient is currently seeing Dr. Christin Fudge at family services in [place]. The patient stated he was diagnosed with bipolar disorder and his current medication is Jordan.  Past Medical History:  Past Medical History  Diagnosis Date  . Coronary artery disease   . Hypertension   . Hypercholesteremia   . Kidney stone   . Tobacco use disorder   . Hx of CABG   . COPD (chronic obstructive pulmonary disease)   . Bipolar disorder (manic depression)     Past Surgical History  Procedure Laterality Date  . Coronary artery bypass graft    . Kidney stone surgery    . Appendectomy    . Lower extremity angiogram N/A 08/15/2012    Procedure: LOWER EXTREMITY ANGIOGRAM with possible PTA /stent;  Surgeon: Corky Crafts, MD;  Location: [facility] CATH LAB;  Service: Cardiovascular;  Laterality: N/A;  . Abdominal angiogram  08/15/2012    Procedure: ABDOMINAL ANGIOGRAM;  Surgeon: Corky Crafts, MD;  Location: [facility] CATH LAB;  Service: Cardiovascular;;  . Cardioversion N/A 08/28/2012    Procedure: Pseudo Compression;  Surgeon: Chuck Hint, MD;  Location: [facility] CATH LAB;  Service: Cardiovascular;  Laterality: N/A;  . Cystoscopy with retrograde pyelogram, ureteroscopy and stent placement Right 12/31/2014    Procedure: CYSTOSCOPY  RIGHT URETEROSCOPY WITH STONE RETREIVAL RIGHT RETROGRADE PYELOGRAM;  Surgeon: Malen Gauze, MD;  Location: [facility] ORS;  Service: Urology;  Laterality: Right;  Family History:  Family History  Problem Relation Age of Onset  . Heart disease Father   . Hyperlipidemia Father   . Heart disease Maternal Grandmother    Family Psychiatric  History: Patient reports having a cousin who committed suicide she was  also addicted to heroin   Social History: Patient is currently applying for disability due to COPD and coronary artery disease. In the past he used to working remodeling. 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 He has a ninth grade education but obtained his GED back in 2015. Patient is divorced. He does not have any children. He is currently living with his disabled on call in [place]. The patient is states this is a low income housing and his uncle son, Steven [name] has asking to leave.  Patient is currently on probation for a shoplifting and breaking and entering charge from [place]. History  Alcohol Use No    Comment: STATES HE QUIT 06/2013 ATTENDS DAILY AA MEETINGS     History  Drug Use No    Social History   Social History  . Marital Status: Divorced    Spouse Name: N/A  . Number of Children: N/A  . Years of Education: N/A   Social History Main Topics  . Smoking status: Current Every Day Smoker -- 1.00 packs/day for 31 years  . Smokeless tobacco: Former Neurosurgeon    Quit date: 04/27/2013  . Alcohol Use: No     Comment: STATES HE QUIT 06/2013 ATTENDS DAILY AA MEETINGS  . Drug Use: No  . Sexual Activity: Yes    Birth Control/ Protection: Condom   Other Topics Concern  . None   Social History Narrative    Sleep: Good  Appetite:  Good  Current Medications: Current Facility-Administered Medications  Medication Dose Route Frequency Provider Last Rate Last Dose  . acetaminophen (TYLENOL) tablet 650 mg  650 mg Oral Q6H PRN Jimmy Footman, MD   650 mg at 02/09/15 2108  . albuterol (PROVENTIL HFA;VENTOLIN HFA) 108 (90 BASE) MCG/ACT inhaler 2 puff  2 puff Inhalation Q6H PRN Jimmy Footman, MD      . alum & mag hydroxide-simeth (MAALOX/MYLANTA) 200-200-20 MG/5ML suspension 30 mL  30 mL Oral Q4H PRN Jimmy Footman, MD      . aspirin chewable tablet 81 mg  81 mg Oral Daily Jimmy Footman, MD   81 mg at 02/10/15 0939  . FLUoxetine  (PROZAC) capsule 10 mg  10 mg Oral Daily Jimmy Footman, MD   10 mg at 02/10/15 0865  . gabapentin (NEURONTIN) capsule 100 mg  100 mg Oral TID Jimmy Footman, MD   100 mg at 02/10/15 7846  . haloperidol (HALDOL) tablet 0.5 mg  0.5 mg Oral TID Jimmy Footman, MD   0.5 mg at 02/10/15 9629  . ibuprofen (ADVIL,MOTRIN) tablet 600 mg  600 mg Oral Q6H PRN Jimmy Footman, MD      . magnesium hydroxide (MILK OF MAGNESIA) suspension 30 mL  30 mL Oral Daily PRN Jimmy Footman, MD      . metoprolol tartrate (LOPRESSOR) tablet 50 mg  50 mg Oral Daily Jimmy Footman, MD   50 mg at 02/10/15 0939  . mometasone-formoterol (DULERA) 100-5 MCG/ACT inhaler 2 puff  2 puff Inhalation BID Jimmy Footman, MD   2 puff at 02/10/15 0805  . nicotine (NICODERM CQ - dosed in mg/24 hours) patch 21 mg  21 mg Transdermal Daily Jimmy Footman, MD   21 mg at 02/10/15 0937  . [  START ON 02/11/2015] predniSONE (DELTASONE) tablet 10 mg  10 mg Oral Q breakfast Jimmy Footman, MD      . predniSONE (DELTASONE) tablet 20 mg  20 mg Oral Q breakfast Jimmy Footman, MD   20 mg at 02/10/15 0805  . predniSONE (DELTASONE) tablet 40 mg  40 mg Oral Once Jimmy Footman, MD   40 mg at 02/09/15 1634  . simvastatin (ZOCOR) tablet 40 mg  40 mg Oral QHS Jimmy Footman, MD   40 mg at 02/09/15 2108  . tamsulosin (FLOMAX) capsule 0.4 mg  0.4 mg Oral Daily Jimmy Footman, MD   0.4 mg at 02/10/15 0939  . traZODone (DESYREL) tablet 100 mg  100 mg Oral QHS PRN Jimmy Footman, MD   100 mg at 02/09/15 2111    Lab Results:  Results for orders placed or performed during the hospital encounter of 02/09/15 (from the past 48 hour(s))  TSH     Status: None   Collection Time: 02/09/15  1:48 PM  Result Value Ref Range   TSH 0.550 0.350 - 4.500 uIU/mL  Lipid panel, fasting     Status: Abnormal   Collection Time:  02/10/15  7:22 AM  Result Value Ref Range   Cholesterol 220 (H) 0 - 200 mg/dL   Triglycerides 161 (H) <150 mg/dL   HDL 45 >09 mg/dL   Total CHOL/HDL Ratio 4.9 RATIO   VLDL 61 (H) 0 - 40 mg/dL   LDL Cholesterol 604 (H) 0 - 99 mg/dL    Comment:        Total Cholesterol/HDL:CHD Risk Coronary Heart Disease Risk Table                     Men   Women  1/2 Average Risk   3.4   3.3  Average Risk       5.0   4.4  2 X Average Risk   9.6   7.1  3 X Average Risk  23.4   11.0        Use the calculated Patient Ratio above and the CHD Risk Table to determine the patient's CHD Risk.        ATP III CLASSIFICATION (LDL):  <100     mg/dL   Optimal  540-981  mg/dL   Near or Above                    Optimal  130-159  mg/dL   Borderline  191-478  mg/dL   High  >295     mg/dL   Very High     Physical Findings: AIMS: Facial and Oral Movements Muscles of Facial Expression: None, normal Lips and Perioral Area: None, normal Jaw: None, normal Tongue: None, normal,Extremity Movements Upper (arms, wrists, hands, fingers): None, normal Lower (legs, knees, ankles, toes): None, normal, Trunk Movements Neck, shoulders, hips: None, normal, Overall Severity Severity of abnormal movements (highest score from questions above): None, normal Incapacitation due to abnormal movements: None, normal Patient's awareness of abnormal movements (rate only patient's report): No Awareness, Dental Status Current problems with teeth and/or dentures?: Yes Does patient usually wear dentures?: No  CIWA:    COWS:     Musculoskeletal: Strength & Muscle Tone: within normal limits Gait & Station: normal Patient leans: N/A  Psychiatric Specialty Exam: Review of Systems  Constitutional: Negative.   HENT: Negative.   Eyes: Negative.   Respiratory: Negative.   Cardiovascular: Negative.   Gastrointestinal: Negative.   Genitourinary: Negative.  Musculoskeletal: Negative.   Skin: Negative.   Neurological: Negative.    Endo/Heme/Allergies: Negative.   Psychiatric/Behavioral: Positive for depression and substance abuse. Negative for suicidal ideas, hallucinations and memory loss. The patient is not nervous/anxious and does not have insomnia.     Blood pressure 133/84, pulse 76, temperature 98.8 F (37.1 C), temperature source Oral, resp. rate 18, height  (1.778 m), weight 94.802 kg (209 lb), SpO2 99 %.Body mass index is 29.99 kg/(m^2).  General Appearance: Neat  Eye Contact::  Fair  Speech:  Normal Rate  Volume:  Normal  Mood:  Irritable  Affect:  Constricted  Thought Process:  Tangential  Orientation:  Full (Time, Place, and Person)  Thought Content:  Hallucinations: None  Suicidal Thoughts:  No  Homicidal Thoughts:  No  Memory:  Immediate;   Fair Recent;   Fair Remote;   Good  Judgement:  Poor  Insight:  Shallow  Psychomotor Activity:  Normal  Concentration:  NA  Recall:  NA  Fund of Knowledge:Fair  Language: Good  Akathisia:  No  Handed:    AIMS (if indicated):     Assets:  Field seismologist  ADL's:  Intact  Cognition: WNL  Sleep:  Number of Hours: 7   Treatment Plan Summary: Daily contact with patient to assess and evaluate symptoms and progress in treatment and Medication management  The patient is a 44 year old Caucasian male from [place] who presented to [facility] reporting shortness of breath and suicidality. The patient claims he has been diagnosed with bipolar disorder recently and that he suffers from a brain injury.  During the assessment the patient was easily agitated he required frequent redirection as he was using profanity constantly. The patient was very tangential in his speech and he was at times hard to follow his story. During the assessment the patient displayed poor frustration tolerance and was frequently saying "I am done, I am done".  Today patient was calmer. But still needed redirection due to using profanity  frequently.  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  Patient will be started on Depakote DR 250 mg 3 times a day.  COPD: The patient will be continued on prednisone for 4 more days as recommended per emergency room. The patient will also be continued on albuterol when necessary. We'll consider starting a maintenance inhaler.  Tobacco use disorder: Patient will be continued on a nicotine patch 21 mg  Hypertension the patient will be continued on Lopressor 50 mg by mouth twice a day  Dyslipidemia the patient will be continued on Zocor 40 mg a day  BPH: Patient will be continued on Flomax  Labs: I will order hemoglobin A1c, lipid panel and TSH in a.m.  Discharge planning: Once stable the patient will be discharged back to his family. Patient says he is able to return there. Plan to discharge in the next day or 2.  Jimmy Footman 02/10/2015, 10:28 AM

## 2015-02-10 NOTE — Progress Notes (Signed)
D: Pt denies SI/HI/AVH. Pt is alert and oriented x 4, affect is blunted , appears anxious and apprehensive , he is interacting with peers and staff appropriately.  A: Pt was offered support and encouragement. Pt was given scheduled medications. Pt was encouraged to attend groups. Q 15 minute checks were done for safety.  R:Pt attends groups and interacts well with peers and staff. Pt is taking medication. .Pt receptive to treatment and safety maintained on unit.

## 2015-02-10 NOTE — Progress Notes (Signed)
Recreation Therapy Notes  Date: 09.28.16 Time: 3:00 pm Location: Day Room  Group Topic: Self-esteem  Goal Area(s) Addresses:  Patient will write at least one positive trait. Patient will verbalize benefit of having a good self-esteem.  Behavioral Response: Arrived late, Attentive  Intervention: I Am  Activity: Patients were given a worksheet with the letter I on it and instructed to fill the letter with positive traits about themselves.  Education:LRT educated patients on ways they can increase their self-esteem.  Education Outcome: Acknowledges education/In group clarification offered  Clinical Observations/Feedback: Patient arrived to group at approximately 3:35 pm. LRT explained activity. Patient wrote positive traits down. Patient did not contribute to group discussion.  Jacquelynn Cree, LRT/CTRS 02/10/2015 4:31 PM

## 2015-02-10 NOTE — [facility] Group Notes (Signed)
[facility] LCSW Group Therapy  02/10/2015 2:24 PM  Type of Therapy:  Group Therapy  Participation Level:  Did Not Attend   Lulu Riding, MSW, LCSWA 02/10/2015, 2:24 PM

## 2015-02-10 NOTE — Progress Notes (Signed)
D: Patient stated slept fair last night .Stated appetite is good and energy level  Is low Stated concentration is good . Stated on Depression scale 5 , hopelessness 9 and anxiety 10 .( low 0-10 high) Continue to have suicidal thoughts no  homicidal ideations  .  No auditory hallucinations  No pain concerns . Appropriate ADL'S. Interacting with peers and staff. No signs of withdrawal.Patient stated he is undecided about going back to the Assisted Living lto take care of his uncle. Voice of being relieved from not having to take care of him. A: Encourage patient participation with unit programming . Instruction  Given on  Medication , verbalize understanding. R: Voice no other concerns. Staff continue to monitor

## 2015-02-11 DIAGNOSIS — F331 Major depressive disorder, recurrent, moderate: Secondary | ICD-10-CM

## 2015-02-11 MED ORDER — HALOPERIDOL 0.5 MG PO TABS: 0.5000 mg | ORAL_TABLET | Freq: Three times a day (TID) | ORAL | Status: DC

## 2015-02-11 MED ORDER — ALBUTEROL SULFATE HFA 108 (90 BASE) MCG/ACT IN AERS: 2.0000 | INHALATION_SPRAY | Freq: Four times a day (QID) | RESPIRATORY_TRACT | Status: DC | PRN

## 2015-02-11 MED ORDER — FLUOXETINE HCL 10 MG PO CAPS: 10.0000 mg | ORAL_CAPSULE | Freq: Every day | ORAL | Status: DC

## 2015-02-11 MED ORDER — DIVALPROEX SODIUM 250 MG PO DR TAB: 250.0000 mg | DELAYED_RELEASE_TABLET | Freq: Three times a day (TID) | ORAL | Status: DC

## 2015-02-11 MED ORDER — TRAZODONE HCL 100 MG PO TABS: 100.0000 mg | ORAL_TABLET | Freq: Every evening | ORAL | Status: DC | PRN

## 2015-02-11 MED ORDER — MOMETASONE FURO-FORMOTEROL FUM 100-5 MCG/ACT IN AERO: 2.0000 | INHALATION_SPRAY | Freq: Two times a day (BID) | RESPIRATORY_TRACT | Status: DC

## 2015-02-11 NOTE — [facility] Group Notes (Signed)
[facility] Group Notes:  (Nursing/MHT/Case Management/Adjunct)  Date:  02/11/2015  Time:  9:13 PM  Type of Therapy:  Group Therapy  Participation Level:  Active  Participation Quality:  Appropriate  Affect:  Appropriate  Cognitive:  Appropriate  Insight:  Appropriate  Engagement in Group:  Engaged  Modes of Intervention:  Discussion  Summary of Progress/Problems:  Burt Ek 02/11/2015, 9:13 PM

## 2015-02-11 NOTE — Progress Notes (Signed)
D: Patient states slept well, appetite is well.  Patient states energy level is appropriate for the time of day, Patient states still feeling a little depressed but denying suicidal and homicidal ideation.  Denying any auditory hallucinations.  Not having any pain at this time.  Patients affect is flat.  Patient compliant with medications. Patient seen in the milieu several times and went to groups.   A: Encouraged patient to attend more groups and interact more with others.  Went over care plan and education with patient again.  Medications given as prescribed. Support given.   R: Patient receptive of information received from staff and will keep attending groups as well as participating in care/ treatment plan
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
## 2015-02-11 NOTE — Progress Notes (Signed)
[facility] MD Progress Note  02/11/2015 10:48 AM GENERAL WEARING  MRN:  [id number] Subjective:  Patient reports feeling improvement in his mood.  He says that yesterday he was even able to smile at times.  Denies side effects from medications. Denies having any physical complaints. Denies SI, HI or auditory or visual hallucinations. Denies major problems with his sleep, appetite energy or concentration.   Per nursing: Pt's mood is improved this evening, pt is visible in milieu with active participation. Pt rated his depression as 2/10 and denies any SI/HI/VAH. Pt complained of pain, PRN pain medicine given for the concern; full relief obtained. No further concerns voiced, will continue to monitor for safety.  Principal Problem: Bipolar affective disorder, current episode depressed Diagnosis:   Patient Active Problem List   Diagnosis Date Noted  . COPD (chronic obstructive pulmonary disease) [J44.9] 02/09/2015  . Tobacco use disorder [Z72.0] 02/09/2015  . Stimulant use disorder (cocaine) [F15.99] 02/09/2015  . Alcohol use disorder, severe, in sustained remission [F10.99] 02/09/2015  . Bipolar affective disorder, current episode depressed [F31.30] 02/08/2015  . Ureteral stone [N20.1] 12/31/2014  . Claudication of left lower extremity [I73.9] 08/14/2012  . CAD s/p CABG 2012 [facility] [I25.810] 11/28/2011  . Hyperlipidemia LDL goal <100 [E78.5] 03/13/2007  . Essential hypertension [I10] 03/13/2007  . GERD [K21.9] 03/13/2007   Total Time spent with patient: 30 minutes  Past Psychiatric History:Patient states he was hospitalized around 2010 at behavioral health in [place] due to substance abuse issues. The patient stated he had a suicidal attempt back in 2009 when he cut his wrist patient has a scars on both of his wrists. Patient is currently seeing Dr. Christin Fudge at family services in [place]. The patient stated he was diagnosed with bipolar disorder and his current medication is  Jordan.  Past Medical History:  Past Medical History  Diagnosis Date  . Coronary artery disease   . Hypertension   . Hypercholesteremia   . Kidney stone   . Tobacco use disorder   . Hx of CABG   . COPD (chronic obstructive pulmonary disease)   . Bipolar disorder (manic depression)     Past Surgical History  Procedure Laterality Date  . Coronary artery bypass graft    . Kidney stone surgery    . Appendectomy    . Lower extremity angiogram N/A 08/15/2012    Procedure: LOWER EXTREMITY ANGIOGRAM with possible PTA /stent;  Surgeon: Corky Crafts, MD;  Location: [facility] CATH LAB;  Service: Cardiovascular;  Laterality: N/A;  . Abdominal angiogram  08/15/2012    Procedure: ABDOMINAL ANGIOGRAM;  Surgeon: Corky Crafts, MD;  Location: [facility] CATH LAB;  Service: Cardiovascular;;  . Cardioversion N/A 08/28/2012    Procedure: Pseudo Compression;  Surgeon: Chuck Hint, MD;  Location: [facility] CATH LAB;  Service: Cardiovascular;  Laterality: N/A;  . Cystoscopy with retrograde pyelogram, ureteroscopy and stent placement Right 12/31/2014    Procedure: CYSTOSCOPY  RIGHT URETEROSCOPY WITH STONE RETREIVAL RIGHT RETROGRADE PYELOGRAM;  Surgeon: Malen Gauze, MD;  Location: [facility] ORS;  Service: Urology;  Laterality: Right;   Family History:  Family History  Problem Relation Age of Onset  . Heart disease Father   . Hyperlipidemia Father   . Heart disease Maternal Grandmother    Family Psychiatric  History: Patient reports having a cousin who committed suicide she was also addicted to heroin   Social History: Patient is currently applying for disability due to COPD and coronary artery disease. In the past  he used to working remodeling. 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 He has a ninth grade education but obtained his GED back in 2015. Patient is divorced. He does not have any children. He is currently living with his disabled on call in [place]. The patient is states this is a  low income housing and his uncle son, [name] has asking to leave.  Patient is currently on probation for a shoplifting and breaking and entering charge from [place]. History  Alcohol Use No    Comment: STATES HE QUIT 06/2013 ATTENDS DAILY AA MEETINGS     History  Drug Use No    Social History   Social History  . Marital Status: Divorced    Spouse Name: N/A  . Number of Children: N/A  . Years of Education: N/A   Social History Main Topics  . Smoking status: Current Every Day Smoker -- 1.00 packs/day for 31 years  . Smokeless tobacco: Former Neurosurgeon    Quit date: 04/27/2013  . Alcohol Use: No     Comment: STATES HE QUIT 06/2013 ATTENDS DAILY AA MEETINGS  . Drug Use: No  . Sexual Activity: Yes    Birth Control/ Protection: Condom   Other Topics Concern  . None   Social History Narrative    Sleep: Good  Appetite:  Good  Current Medications: Current Facility-Administered Medications  Medication Dose Route Frequency Provider Last Rate Last Dose  . acetaminophen (TYLENOL) tablet 650 mg  650 mg Oral Q6H PRN Jimmy Footman, MD   650 mg at 02/09/15 2108  . albuterol (PROVENTIL HFA;VENTOLIN HFA) 108 (90 BASE) MCG/ACT inhaler 2 puff  2 puff Inhalation Q6H PRN Jimmy Footman, MD      . alum & mag hydroxide-simeth (MAALOX/MYLANTA) 200-200-20 MG/5ML suspension 30 mL  30 mL Oral Q4H PRN Jimmy Footman, MD      . aspirin chewable tablet 81 mg  81 mg Oral Daily Jimmy Footman, MD   81 mg at 02/11/15 0931  . divalproex (DEPAKOTE) DR tablet 250 mg  250 mg Oral 3 times per day Jimmy Footman, MD   250 mg at 02/11/15 [phone]  . FLUoxetine (PROZAC) capsule 10 mg  10 mg Oral Daily Jimmy Footman, MD   10 mg at 02/11/15 0931  . haloperidol (HALDOL) tablet 0.5 mg  0.5 mg Oral TID Jimmy Footman, MD   0.5 mg at 02/11/15 0931  . ibuprofen (ADVIL,MOTRIN) tablet 600 mg  600 mg Oral Q6H PRN Jimmy Footman, MD   600 mg at  02/10/15 2119  . magnesium hydroxide (MILK OF MAGNESIA) suspension 30 mL  30 mL Oral Daily PRN Jimmy Footman, MD      . metoprolol tartrate (LOPRESSOR) tablet 50 mg  50 mg Oral Daily Jimmy Footman, MD   50 mg at 02/11/15 0931  . mometasone-formoterol (DULERA) 100-5 MCG/ACT inhaler 2 puff  2 puff Inhalation BID Jimmy Footman, MD   2 puff at 02/11/15 0809  . nicotine (NICODERM CQ - dosed in mg/24 hours) patch 21 mg  21 mg Transdermal Daily Jimmy Footman, MD   21 mg at 02/11/15 0935  . predniSONE (DELTASONE) tablet 20 mg  20 mg Oral Q breakfast Jimmy Footman, MD   20 mg at 02/10/15 0805  . predniSONE (DELTASONE) tablet 40 mg  40 mg Oral Once Jimmy Footman, MD   40 mg at 02/09/15 1634  . predniSONE (DELTASONE) tablet 5 mg  5 mg Oral Q breakfast Jimmy Footman, MD  5 mg at 02/11/15 0812  . simvastatin (ZOCOR) tablet 40 mg  40 mg Oral QHS Jimmy Footman, MD   40 mg at 02/10/15 2119  . tamsulosin (FLOMAX) capsule 0.4 mg  0.4 mg Oral Daily Jimmy Footman, MD   0.4 mg at 02/11/15 0931  . traZODone (DESYREL) tablet 100 mg  100 mg Oral QHS PRN Jimmy Footman, MD   100 mg at 02/10/15 2119    Lab Results:  Results for orders placed or performed during the hospital encounter of 02/09/15 (from the past 48 hour(s))  TSH     Status: None   Collection Time: 02/09/15  1:48 PM  Result Value Ref Range   TSH 0.550 0.350 - 4.500 uIU/mL  Hemoglobin A1c     Status: None   Collection Time: 02/10/15  7:22 AM  Result Value Ref Range   Hgb A1c MFr Bld 5.2 4.0 - 6.0 %  Lipid panel, fasting     Status: Abnormal   Collection Time: 02/10/15  7:22 AM  Result Value Ref Range   Cholesterol 220 (H) 0 - 200 mg/dL   Triglycerides 782 (H) <150 mg/dL   HDL 45 >95 mg/dL   Total CHOL/HDL Ratio 4.9 RATIO   VLDL 61 (H) 0 - 40 mg/dL   LDL Cholesterol 621 (H) 0 - 99 mg/dL    Comment:        Total  Cholesterol/HDL:CHD Risk Coronary Heart Disease Risk Table                     Men   Women  1/2 Average Risk   3.4   3.3  Average Risk       5.0   4.4  2 X Average Risk   9.6   7.1  3 X Average Risk  23.4   11.0        Use the calculated Patient Ratio above and the CHD Risk Table to determine the patient's CHD Risk.        ATP III CLASSIFICATION (LDL):  <100     mg/dL   Optimal  308-657  mg/dL   Near or Above                    Optimal  130-159  mg/dL   Borderline  846-962  mg/dL   High  >952     mg/dL   Very High     Physical Findings: AIMS: Facial and Oral Movements Muscles of Facial Expression: None, normal Lips and Perioral Area: None, normal Jaw: None, normal Tongue: None, normal,Extremity Movements Upper (arms, wrists, hands, fingers): None, normal Lower (legs, knees, ankles, toes): None, normal, Trunk Movements Neck, shoulders, hips: None, normal, Overall Severity Severity of abnormal movements (highest score from questions above): None, normal Incapacitation due to abnormal movements: None, normal Patient's awareness of abnormal movements (rate only patient's report): No Awareness, Dental Status Current problems with teeth and/or dentures?: Yes Does patient usually wear dentures?: No  CIWA:    COWS:     Musculoskeletal: Strength & Muscle Tone: within normal limits Gait & Station: normal Patient leans: N/A  Psychiatric Specialty Exam: Review of Systems  Constitutional: Negative.   HENT: Negative.   Eyes: Negative.   Respiratory: Negative.   Cardiovascular: Negative.   Gastrointestinal: Negative.   Genitourinary: Negative.   Musculoskeletal: Negative.   Skin: Negative.   Neurological: Negative.   Endo/Heme/Allergies: Negative.   Psychiatric/Behavioral: Positive for depression and substance abuse. Negative for suicidal ideas,  hallucinations and memory loss. The patient is not nervous/anxious and does not have insomnia.     Blood pressure 124/86, pulse  80, temperature 98.2 F (36.8 C), temperature source Oral, resp. rate 18, height  (1.778 m), weight 94.802 kg (209 lb), SpO2 99 %.Body mass index is 29.99 kg/(m^2).  General Appearance: Neat  Eye Contact::  Fair  Speech:  Normal Rate  Volume:  Normal  Mood:  Euthymic  Affect:  Appropriate and Congruent  Thought Process:  Linear  Orientation:  Full (Time, Place, and Person)  Thought Content:  Hallucinations: None  Suicidal Thoughts:  No  Homicidal Thoughts:  No  Memory:  Immediate;   Fair Recent;   Fair Remote;   Good  Judgement:  Fair  Insight:  Shallow  Psychomotor Activity:  Normal  Concentration:  NA  Recall:  NA  Fund of Knowledge:Fair  Language: Good  Akathisia:  No  Handed:    AIMS (if indicated):     Assets:  Field seismologist  ADL's:  Intact  Cognition: WNL  Sleep:  Number of Hours: 7.15   Treatment Plan Summary: Daily contact with patient to assess and evaluate symptoms and progress in treatment and Medication management  The patient is a 44 year old Caucasian male from [place] who presented to [facility] reporting shortness of breath and suicidality. The patient claims he has been diagnosed with bipolar disorder recently and that he suffers from a brain injury.  During the assessment the patient was easily agitated he required frequent redirection as he was using profanity constantly. The patient was very tangential in his speech and he was at times hard to follow his story. During the assessment the patient displayed poor frustration tolerance and was frequently saying "I am done, I am done".  Today patient was calmer. But still needed redirection due to using profanity frequently.  Major depressive disorder/significant issues with irritability and anger: Continue haloperidol 0.5 mg by mouth 3 times a day,  fluoxetine 10 mg by mouth daily and Depakote DR 250 mg tid which was started yesterday.  Insomnia: Continue trazodone 100  mg by mouth daily at bedtime when necessary.  COPD: Continue dulera daily, continue albuterol when necessary for shortness of breath. Continue prednisone taper which will be completed tomorrow.  Tobacco use disorder: Patient will be continued on a nicotine patch 21 mg  Hypertension the patient will be continued on Lopressor 50 mg by mouth twice a day  Dyslipidemia the patient will be continued on Zocor 40 mg a day  BPH: Patient will be continued on Flomax  Labs: Hemoglobin A1c is within the normal limits. Cholesterol is elevated patient is already on Zocor  Discharge planning: Once stable the patient will be discharged back to his family. Patient says he is able to return there. Plan to discharge tomorrow.  Jimmy Footman 02/11/2015, 10:48 AM

## 2015-02-11 NOTE — [facility] Group Notes (Signed)
[facility] Group Notes:  (Nursing/MHT/Case Management/Adjunct)  Date:  02/11/2015  Time:  1:22 PM  Type of Therapy:  Psychoeducational Skills  Participation Level:  Active  Participation Quality:  Appropriate and Attentive  Affect:  Appropriate  Cognitive:  Alert and Appropriate  Insight:  Appropriate and Good  Engagement in Group:  Engaged  Modes of Intervention:  Discussion, Education and Support  Summary of Progress/Problems:  Darrow Bussing 02/11/2015, 1:22 PM

## 2015-02-11 NOTE — [facility] Group Notes (Signed)
[facility] LCSW Group Therapy  02/11/2015 3:20 PM  Type of Therapy:  Group Therapy  Participation Level:  Active  Participation Quality:  Appropriate, Attentive, Intrusive and Redirectable  Affect:  Appropriate and Excited  Cognitive:  Alert, Appropriate and Oriented  Insight:  Improving  Engagement in Therapy:  Improving  Modes of Intervention:  Socialization and Support  Summary of Progress/Problems: Patient attended and participated in group discussion but was monopolizing however redirectable. Patient shared that he has been using AA as a resource to help with his alcohol addiction and also reports that he has activities that he enjoys such as watching movies and likes to "eat good food".   Lulu Riding, MSW, LCSWA 02/11/2015, 3:20 PM

## 2015-02-11 NOTE — Plan of Care (Signed)
Problem: Ineffective individual coping Goal: LTG: Patient will report a decrease in negative feelings Outcome: Progressing Patient has reported a decrease in depression and anxiety this shift.  Patient has ben more mobile this shift and more involved with care plan

## 2015-02-11 NOTE — [facility] Suicide Risk Assessment (Addendum)
[facility] Discharge Suicide Risk Assessment   Demographic Factors:  Male, Divorced or widowed, Caucasian, Low socioeconomic status and Unemployed  Total Time spent with patient: 30 minutes  Psychiatric Specialty Exam: Physical Exam  ROS                                                         Have you used any form of tobacco in the last 30 days? (Cigarettes, Smokeless Tobacco, Cigars, and/or Pipes): No (Patient states he does not smoke.)  Has this patient used any form of tobacco in the last 30 days? (Cigarettes, Smokeless Tobacco, Cigars, and/or Pipes) Yes, A prescription for an FDA-approved tobacco cessation medication was offered at discharge and the patient refused  Mental Status Per Nursing Assessment::   On Admission:     Current Mental Status by Physician: Calm, pleasant, cooperative. No evidence of aggression, or agitation. Denies SI, HI. Described mood as much improved. Patient has been attending group. Compliant with medications. He is hopeful and future oriented  Loss Factors: Decrease in vocational status and Decline in physical health  Historical Factors: Prior suicide attempts  Risk Reduction Factors:   Living with another person, especially a relative  Continued Clinical Symptoms:  Severe Anxiety and/or Agitation Depression:   Comorbid alcohol abuse/dependence Impulsivity Alcohol/Substance Abuse/Dependencies Previous Psychiatric Diagnoses and Treatments Medical Diagnoses and Treatments/Surgeries  Cognitive Features That Contribute To Risk:  Closed-mindedness    Suicide Risk:  Minimal: No identifiable suicidal ideation.  Patients presenting with no risk factors but with morbid ruminations; may be classified as minimal risk based on the severity of the depressive symptoms  Principal Problem: MDD (major depressive disorder), recurrent episode, moderate Discharge Diagnoses:  Patient Active Problem List   Diagnosis Date Noted  . MDD  (major depressive disorder), recurrent episode, moderate [F33.1] 02/11/2015  . COPD (chronic obstructive pulmonary disease) [J44.9] 02/09/2015  . Tobacco use disorder [Z72.0] 02/09/2015  . Stimulant use disorder (cocaine) [F15.99] 02/09/2015  . Alcohol use disorder, severe, in sustained remission [F10.99] 02/09/2015  . Ureteral stone [N20.1] 12/31/2014  . Claudication of left lower extremity [I73.9] 08/14/2012  . CAD s/p CABG 2012 [facility] [I25.810] 11/28/2011  . Hyperlipidemia LDL goal <100 [E78.5] 03/13/2007  . Essential hypertension [I10] 03/13/2007  . GERD [K21.9] 03/13/2007     Is patient on multiple antipsychotic therapies at discharge:  No   Has Patient had three or more failed trials of antipsychotic monotherapy by history:  No  Recommended Plan for Multiple Antipsychotic Therapies: NA    Jimmy Footman 02/12/2015, 7:10 AM

## 2015-02-12 NOTE — Progress Notes (Signed)
  [facility] Adult Case Management Discharge Plan :  Will you be returning to the same living situation after discharge:  Yes,  Home  At discharge, do you have transportation home?: Yes,  Bus  Do you have the ability to pay for your medications: Yes,  mental health   Release of information consent forms completed and in the chart;  Patient's signature needed at discharge.  Patient to Follow up at: Follow-up Information    Follow up with Pt refused referral . Go in 3 days.   Why:  Please go to the walk in clinic for your hospital follow up appointment. Monday- Friday between 8:00am and 3:00pm. They are closed for lunch.    Contact information:   [facility]  [address] [place],  [place], [place] [address] Phone: [phone]      Patient denies SI/HI: Yes,  yes    Safety Planning and Suicide Prevention discussed: Yes,  with patient   Have you used any form of tobacco in the last 30 days? (Cigarettes, Smokeless Tobacco, Cigars, and/or Pipes): No (Patient states he does not smoke.)  Has patient been referred to the Quitline?: N/A patient is not a smoker  [employer] MSW, LCSWA  02/12/2015, 10:51 AM

## 2015-02-12 NOTE — [facility] Suicide Risk Assessment (Signed)
[facility] INPATIENT:  Family/Significant Other Suicide Prevention Education  Suicide Prevention Education:  Patient Refusal for Family/Significant Other Suicide Prevention Education: The patient Steven [name] has refused to provide written consent for family/significant other to be provided Family/Significant Other Suicide Prevention Education during admission and/or prior to discharge.  Physician notified.  SPE completed with pt and he was encouraged to share information provided in SPI pamphlet with support network, ask questions, and talk about any concerns relating to SPE. Pt denies SI/HI/AVH and verbalized understanding of information presented.    [employer] MSW, LCSWA  02/12/2015, 9:37 AM

## 2015-02-12 NOTE — Tx Team (Signed)
Interdisciplinary Treatment Plan Update (Adult)  Date:  02/12/2015 Time Reviewed:  10:49 AM  Progress in Treatment: Attending groups: Yes. Participating in groups:  Yes. Taking medication as prescribed:  Yes. Tolerating medication:  Yes. Family/Significant othe contact made:  No, will contact:  Pt refused  Patient understands diagnosis:  No. and As evidenced by:  Limited insight  Discussing patient identified problems/goals with staff:  Yes. Medical problems stabilized or resolved:  Yes. Denies suicidal/homicidal ideation: Yes. Issues/concerns per patient self-inventory:  No. Other:  New problem(s) identified: No, Describe:  NA  Discharge Plan or Barriers: Pt plans to return home and follow up with outpatient.    Reason for Continuation of Hospitalization: Depression Medication stabilization Suicidal ideation  Comments:Patient reports feeling improvement in his mood. He says that yesterday he was even able to smile at times. Denies side effects from medications. Denies having any physical complaints. Denies SI, HI or auditory or visual hallucinations. Denies major problems with his sleep, appetite energy or concentration  Estimated length of stay: Pt will d/c today.   New goal(s): NA  Review of initial/current patient goals per problem list:   1.  Goal(s): Patient will participate in aftercare plan * Met: Yes  * Target date: at discharge * As evidenced by: Patient will participate within aftercare plan AEB aftercare provider and housing plan at discharge being identified.   2.  Goal (s): Patient will exhibit decreased depressive symptoms and suicidal ideations. * Met: Yes *  Target date: at discharge * As evidenced by: Patient will utilize self rating of depression at 3 or below and demonstrate decreased signs of depression or be deemed stable for discharge by MD.   3.  Goal(s): Patient will demonstrate decreased signs and symptoms of anxiety. * Met: Yes * Target date:  at discharge * As evidenced by: Patient will utilize self rating of anxiety at 3 or below and demonstrated decreased signs of anxiety, or be deemed stable for discharge by MD   4.  Goal(s): Patient will demonstrate decreased signs of withdrawal due to substance abuse * Met: Yes * Target date: at discharge * As evidenced by: Patient will produce a CIWA/COWS score of 0, have stable vitals signs, and no symptoms of withdrawal.  Attendees: Patient:  Steven [name] 9/30/201610:49 AM  Family:   9/30/201610:49 AM  Physician:  Dr. Hernandez  9/30/201610:49 AM  Nursing:   Nira Conn, RN  9/30/201610:49 AM  Case Manager:   9/30/201610:49 AM  Counselor:   9/30/201610:49 AM  Other:  [place]  9/30/201610:49 AM  Other:   9/30/201610:49 AM  Other:   9/30/201610:49 AM  Other:  9/30/201610:49 AM  Other:  9/30/201610:49 AM  Other:  9/30/201610:49 AM  Other:  9/30/201610:49 AM  Other:  9/30/201610:49 AM  Other:  9/30/201610:49 AM  Other:   9/30/201610:49 AM   Scribe for Treatment Team:   Wray Kearns MSW, [place] , 02/12/2015, 10:49 AM

## 2015-02-12 NOTE — Plan of Care (Signed)
Problem: Spiritual Needs Goal: Ability to function at adequate level Outcome: Progressing Pt reports doing well.  Problem: Ineffective individual coping Goal: LTG: Patient will report a decrease in negative feelings Outcome: Progressing Patient report feeling better and being ready for discharge.

## 2015-02-12 NOTE — Progress Notes (Signed)
D: pt aware of discharge this shift, pt denies suicidal ideation or homicidal ideation, pt calm and cooperative, no distress noted  A: all personal items in locker returned to patient, instructions given on discharge information, received prescriptions and follow up appointment  R: patient states she will comply with outpatient services and medications as prescribed.  Patient left via courtesy car to the bus stop

## 2015-02-12 NOTE — [facility] Counselor (Signed)
Adult Comprehensive Assessment  Patient ID: [name], male   DOB: 1970-12-23, 44 y.o.   MRN: [id number]  Information Source: Information source: Patient  Current Stressors:  Educational / Learning stressors: Currently attending [name]  Employment / Job issues: Unemployed.  Family Relationships: Stressed family relatioships.  Financial / Lack of resources (include bankruptcy): No income.  Housing / Lack of housing: Currently staying with his uncle.  Physical health (include injuries & life threatening diseases): None reported.  Social relationships: None reported.  Substance abuse: Pt reports using 2 lines of cociane 2 weeks ago.  Bereavement / Loss: Pt lost apartment in May.   Living/Environment/Situation:  Living Arrangements: Other relatives Living conditions (as described by patient or guardian): Stressful because he is his uncle's caregiver.  How long has patient lived in current situation?: Since May 2016 What is atmosphere in current home: Other (Comment), Temporary (Stressful)  Family History:  Marital status: Divorced Divorced, when?: 2001 What types of issues is patient dealing with in the relationship?: Refused to answer  Does patient have children?: No  Childhood History:  By whom was/is the patient raised?: Mother/father and step-parent, Grandparents Additional childhood history information: Grandparents raised him from age 61 to 45. He went to live with his father from age 60 to 65. Met his mother at age 10.  Description of patient's relationship with caregiver when they were a child: Not a good relationship with his father or step mother  Patient's description of current relationship with people who raised him/her: Not a good relationship with his father or step mother.  Does patient have siblings?: Yes Number of Siblings: 2 Description of patient's current relationship with siblings: No contact with them since 2001 Did patient suffer any  verbal/emotional/physical/sexual abuse as a child?: No Did patient suffer from severe childhood neglect?: No Has patient ever been sexually abused/assaulted/raped as an adolescent or adult?: No Was the patient ever a victim of a crime or a disaster?: No Witnessed domestic violence?: No Has patient been effected by domestic violence as an adult?: No  Education:  Highest grade of school patient has completed: some college Currently a student?: Yes Name of school: [name] How long has the patient attended?: 1 semester  Learning disability?: No  Employment/Work Situation:   Employment situation: Radio broadcast assistant job has been impacted by current illness: No What is the longest time patient has a held a job?: 5 years  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 Pt reports soberity since 2014.  Alcohol/Substance Abuse Treatment Hx: Attends AA/NA Has alcohol/substance abuse ever caused legal problems?: No  Social Support System:   Heritage manager System: Poor Describe Community Support System: Uncle, cousin  Type of faith/religion: NA How does patient's faith help to cope with current illness?: NA  Leisure/Recreation:   Leisure and Hobbies: golfing, fishing   Strengths/Needs:   What things does the patient do well?: Unable to answer  In what areas does patient struggle / problems for patient: depression, hopelessness   Discharge Plan:   Does patient have access to transportation?: Yes Will patient be returning to same living situation after discharge?: Yes Currently receiving community mental health services: Yes (  From Whom) ([facility] [place]  ) Does patient have financial barriers related to discharge medications?: Yes Patient description of barriers related to discharge medications: No insurance, no income.   Summary/Recommendations:   [name] is a 44 year old male who presented to [name] with depression and SI. He reports increased depression since losing his apartment in May. He states she lost his apartment in May due to being in jail for a probation violation. Since May, he has been staying with his disabled uncle. He has a history of alcohol, cocaine and marijuana abuse. He reports being sober since 2014 but had 2 lines of cocaine a week ago. He does not want any treatment for SA at this time. He states he attends AA which works for him. He receives outpatient services at [name] but does not want them to be contacted. He is afraid they will tell his probation office that he relapsed on cocaine which would cause his to go back to jail. Walk in hours for Family services was provided. He plans to return home to [name] and follow up with outpatient. Recommendations include; crisis stabilization, medication management, therapeutic milieu and encourage group attendance and participation.   [name].MSW, LCSWA  02/12/2015

## 2015-02-12 NOTE — [facility] Group Notes (Signed)
[facility] Group Notes:  (Nursing/MHT/Case Management/Adjunct)  Date:  02/12/2015  Time:  11:58 AM  Type of Therapy:  Psychoeducational Skills  Participation Level:  Active  Participation Quality:  Appropriate  Affect:  Appropriate  Cognitive:  Appropriate  Insight:  Appropriate  Engagement in Group:  Engaged  Modes of Intervention:  Discussion and Education  Summary of Progress/Problems:  Mickey Farber 02/12/2015, 11:58 AM

## 2015-02-21 NOTE — ED Notes (Signed)
Pt requesting work note, chart opened then noted that he was followed at [employer] not here

## 2015-03-08 ENCOUNTER — Inpatient Hospital Stay (HOSPITAL_COMMUNITY)
Admission: AD | Admit: 2015-03-08 | Discharge: 2015-03-11 | DRG: 885 | Disposition: A | Discharge status: Home / Self Care | Payer: Federal, State, Local not specified - Other | Source: Intra-hospital | Attending: Psychiatry | Admitting: Psychiatry

## 2015-03-08 ENCOUNTER — Encounter (HOSPITAL_COMMUNITY): Payer: Self-pay | Admitting: Emergency Medicine

## 2015-03-08 ENCOUNTER — Encounter (HOSPITAL_COMMUNITY): Payer: Self-pay | Admitting: *Deleted

## 2015-03-08 ENCOUNTER — Emergency Department (HOSPITAL_COMMUNITY)
Admission: EM | Admit: 2015-03-08 | Discharge: 2015-03-08 | Disposition: A | Discharge status: Other Acute Inpatient Hospital | Payer: No Typology Code available for payment source | Attending: Emergency Medicine | Admitting: Emergency Medicine

## 2015-03-08 DIAGNOSIS — F1994 Other psychoactive substance use, unspecified with psychoactive substance-induced mood disorder: Secondary | ICD-10-CM | POA: Diagnosis present

## 2015-03-08 DIAGNOSIS — Z79899 Other long term (current) drug therapy: Secondary | ICD-10-CM | POA: Insufficient documentation

## 2015-03-08 DIAGNOSIS — Z951 Presence of aortocoronary bypass graft: Secondary | ICD-10-CM | POA: Insufficient documentation

## 2015-03-08 DIAGNOSIS — F3130 Bipolar disorder, current episode depressed, mild or moderate severity, unspecified: Principal | ICD-10-CM | POA: Diagnosis present

## 2015-03-08 DIAGNOSIS — I2510 Atherosclerotic heart disease of native coronary artery without angina pectoris: Secondary | ICD-10-CM | POA: Diagnosis present

## 2015-03-08 DIAGNOSIS — J449 Chronic obstructive pulmonary disease, unspecified: Secondary | ICD-10-CM | POA: Diagnosis present

## 2015-03-08 DIAGNOSIS — E7800 Pure hypercholesterolemia, unspecified: Secondary | ICD-10-CM | POA: Insufficient documentation

## 2015-03-08 DIAGNOSIS — R45851 Suicidal ideations: Secondary | ICD-10-CM

## 2015-03-08 DIAGNOSIS — F17210 Nicotine dependence, cigarettes, uncomplicated: Secondary | ICD-10-CM | POA: Diagnosis present

## 2015-03-08 DIAGNOSIS — F319 Bipolar disorder, unspecified: Secondary | ICD-10-CM | POA: Diagnosis present

## 2015-03-08 DIAGNOSIS — Z7982 Long term (current) use of aspirin: Secondary | ICD-10-CM | POA: Insufficient documentation

## 2015-03-08 DIAGNOSIS — I10 Essential (primary) hypertension: Secondary | ICD-10-CM | POA: Diagnosis present

## 2015-03-08 DIAGNOSIS — F1010 Alcohol abuse, uncomplicated: Secondary | ICD-10-CM | POA: Diagnosis present

## 2015-03-08 DIAGNOSIS — Z720 Tobacco use: Secondary | ICD-10-CM | POA: Insufficient documentation

## 2015-03-08 DIAGNOSIS — Z87442 Personal history of urinary calculi: Secondary | ICD-10-CM | POA: Insufficient documentation

## 2015-03-08 DIAGNOSIS — I252 Old myocardial infarction: Secondary | ICD-10-CM

## 2015-03-08 DIAGNOSIS — F1410 Cocaine abuse, uncomplicated: Secondary | ICD-10-CM | POA: Diagnosis present

## 2015-03-08 DIAGNOSIS — F1414 Cocaine abuse with cocaine-induced mood disorder: Secondary | ICD-10-CM | POA: Insufficient documentation

## 2015-03-08 HISTORY — DX: Unspecified asthma, uncomplicated: J45.909

## 2015-03-08 LAB — COMPREHENSIVE METABOLIC PANEL
ALBUMIN: 4.7 g/dL (ref 3.5–5.0)
ALK PHOS: 79 U/L (ref 38–126)
ALT: 32 U/L (ref 17–63)
ANION GAP: 9 (ref 5–15)
AST: 28 U/L (ref 15–41)
BUN: 12 mg/dL (ref 6–20)
CHLORIDE: 101 mmol/L (ref 101–111)
CO2: 29 mmol/L (ref 22–32)
Calcium: 9.8 mg/dL (ref 8.9–10.3)
Creatinine, Ser: 1.17 mg/dL (ref 0.61–1.24)
GFR calc Af Amer: >60 mL/min (ref >60)
GFR calc non Af Amer: >60 mL/min (ref >60)
GLUCOSE: 92 mg/dL (ref 65–99)
POTASSIUM: 4.2 mmol/L (ref 3.5–5.1)
SODIUM: 139 mmol/L (ref 135–145)
Total Bilirubin: 0.7 mg/dL (ref 0.3–1.2)
Total Protein: 8.6 g/dL — ABNORMAL HIGH (ref 6.5–8.1)

## 2015-03-08 LAB — CBC
HEMATOCRIT: 48.6 % (ref 39.0–52.0)
HEMOGLOBIN: 16.9 g/dL (ref 13.0–17.0)
MCH: 33.7 pg (ref 26.0–34.0)
MCHC: 34.8 g/dL (ref 30.0–36.0)
MCV: 96.8 fL (ref 78.0–100.0)
Platelets: 283 10*3/uL (ref 150–400)
RBC: 5.02 MIL/uL (ref 4.22–5.81)
RDW: 13 % (ref 11.5–15.5)
WBC: 6.6 10*3/uL (ref 4.0–10.5)

## 2015-03-08 LAB — ETHANOL: Alcohol, Ethyl (B): 78 mg/dL — ABNORMAL HIGH (ref <5)

## 2015-03-08 LAB — RAPID URINE DRUG SCREEN, HOSP PERFORMED
Amphetamines: NOT DETECTED
BARBITURATES: NOT DETECTED
BENZODIAZEPINES: NOT DETECTED
COCAINE: POSITIVE — AB
Opiates: NOT DETECTED
TETRAHYDROCANNABINOL: NOT DETECTED

## 2015-03-08 LAB — ACETAMINOPHEN LEVEL

## 2015-03-08 LAB — SALICYLATE LEVEL: Salicylate Lvl: <4 mg/dL (ref 2.8–30.0)

## 2015-03-08 MED ORDER — TAMSULOSIN HCL 0.4 MG PO CAPS
0.4000 mg | ORAL_CAPSULE | Freq: Every day | ORAL | Status: DC
  Administered 2015-03-08: 0.4 mg via ORAL
  Filled 2015-03-08: qty 1

## 2015-03-08 MED ORDER — MOMETASONE FURO-FORMOTEROL FUM 100-5 MCG/ACT IN AERO
2.0000 | INHALATION_SPRAY | Freq: Two times a day (BID) | RESPIRATORY_TRACT | Status: DC
  Administered 2015-03-08 (×2): 2 via RESPIRATORY_TRACT
  Filled 2015-03-08: qty 8.8

## 2015-03-08 MED ORDER — ALBUTEROL SULFATE HFA 108 (90 BASE) MCG/ACT IN AERS: 2.0000 | INHALATION_SPRAY | RESPIRATORY_TRACT | Status: DC | PRN

## 2015-03-08 MED ORDER — FLUOXETINE HCL 10 MG PO CAPS
10.0000 mg | ORAL_CAPSULE | Freq: Every day | ORAL | Status: DC
  Administered 2015-03-08: 10 mg via ORAL
  Filled 2015-03-08: qty 1

## 2015-03-08 MED ORDER — HALOPERIDOL 1 MG PO TABS
0.5000 mg | ORAL_TABLET | Freq: Three times a day (TID) | ORAL | Status: DC
  Administered 2015-03-08 (×3): 0.5 mg via ORAL
  Filled 2015-03-08 (×3): qty 1

## 2015-03-08 MED ORDER — ALUM & MAG HYDROXIDE-SIMETH 200-200-20 MG/5ML PO SUSP: 30.0000 mL | ORAL | Status: DC | PRN

## 2015-03-08 MED ORDER — ONDANSETRON HCL 4 MG PO TABS: 4.0000 mg | ORAL_TABLET | Freq: Three times a day (TID) | ORAL | Status: DC | PRN

## 2015-03-08 MED ORDER — ACETAMINOPHEN 325 MG PO TABS: 650.0000 mg | ORAL_TABLET | ORAL | Status: DC | PRN

## 2015-03-08 MED ORDER — AEROCHAMBER Z-STAT PLUS/MEDIUM MISC: 1.0000 | Freq: Once | Status: DC

## 2015-03-08 MED ORDER — ASPIRIN 81 MG PO CHEW
81.0000 mg | CHEWABLE_TABLET | Freq: Every day | ORAL | Status: DC
  Administered 2015-03-08: 81 mg via ORAL
  Filled 2015-03-08: qty 1

## 2015-03-08 MED ORDER — OXYCODONE-ACETAMINOPHEN 5-325 MG PO TABS: 1.0000 | ORAL_TABLET | Freq: Once | ORAL | Status: DC

## 2015-03-08 MED ORDER — SIMVASTATIN 40 MG PO TABS
40.0000 mg | ORAL_TABLET | Freq: Every day | ORAL | Status: DC
  Administered 2015-03-08: 40 mg via ORAL
  Filled 2015-03-08: qty 1

## 2015-03-08 MED ORDER — ZOLPIDEM TARTRATE 5 MG PO TABS: 5.0000 mg | ORAL_TABLET | Freq: Every evening | ORAL | Status: DC | PRN

## 2015-03-08 MED ORDER — METOPROLOL TARTRATE 25 MG PO TABS
50.0000 mg | ORAL_TABLET | Freq: Two times a day (BID) | ORAL | Status: DC
  Administered 2015-03-08 (×2): 50 mg via ORAL
  Filled 2015-03-08 (×2): qty 2

## 2015-03-08 MED ORDER — DIVALPROEX SODIUM 250 MG PO DR TAB
250.0000 mg | DELAYED_RELEASE_TABLET | Freq: Three times a day (TID) | ORAL | Status: DC
  Administered 2015-03-08 (×3): 250 mg via ORAL
  Filled 2015-03-08 (×3): qty 1

## 2015-03-08 MED ORDER — NICOTINE 21 MG/24HR TD PT24
21.0000 mg | MEDICATED_PATCH | Freq: Every day | TRANSDERMAL | Status: DC | PRN
  Administered 2015-03-08: 21 mg via TRANSDERMAL
  Filled 2015-03-08: qty 1

## 2015-03-08 MED ORDER — TRAZODONE HCL 100 MG PO TABS: 100.0000 mg | ORAL_TABLET | Freq: Every evening | ORAL | Status: DC | PRN

## 2015-03-08 MED ORDER — IBUPROFEN 200 MG PO TABS
600.0000 mg | ORAL_TABLET | Freq: Three times a day (TID) | ORAL | Status: DC | PRN
Start: 2015-03-08 — End: 2015-03-08
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
## 2015-03-08 NOTE — ED Notes (Signed)
Bed: WBH40 Expected date:  Expected time:  Means of arrival:  Comments: Hold for triage 4 
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
## 2015-03-08 NOTE — ED Notes (Signed)
TTS at bedside. 
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
## 2015-03-08 NOTE — ED Notes (Signed)
Patient has stayed in his room much of the day.  Has been irritable and argumentative.  Started on medications as ordered.  Safety maintained with 15 minute checks and security cameras.

## 2015-03-09 ENCOUNTER — Encounter (HOSPITAL_COMMUNITY): Payer: Self-pay | Admitting: Psychiatry

## 2015-03-09 DIAGNOSIS — F1010 Alcohol abuse, uncomplicated: Secondary | ICD-10-CM

## 2015-03-09 DIAGNOSIS — R45851 Suicidal ideations: Secondary | ICD-10-CM

## 2015-03-09 DIAGNOSIS — F1410 Cocaine abuse, uncomplicated: Secondary | ICD-10-CM

## 2015-03-09 DIAGNOSIS — F1994 Other psychoactive substance use, unspecified with psychoactive substance-induced mood disorder: Secondary | ICD-10-CM

## 2015-03-09 DIAGNOSIS — F3130 Bipolar disorder, current episode depressed, mild or moderate severity, unspecified: Principal | ICD-10-CM

## 2015-03-09 MED ORDER — PANTOPRAZOLE SODIUM 40 MG PO TBEC
40.0000 mg | DELAYED_RELEASE_TABLET | Freq: Every day | ORAL | Status: DC
  Administered 2015-03-09 – 2015-03-11 (×3): 40 mg via ORAL
  Filled 2015-03-09 (×6): qty 1

## 2015-03-09 MED ORDER — DIVALPROEX SODIUM 250 MG PO DR TAB
250.0000 mg | DELAYED_RELEASE_TABLET | Freq: Three times a day (TID) | ORAL | Status: DC
  Administered 2015-03-09 – 2015-03-11 (×8): 250 mg via ORAL
  Filled 2015-03-09: qty 21
  Filled 2015-03-09 (×7): qty 1
  Filled 2015-03-09 (×2): qty 21
  Filled 2015-03-09 (×4): qty 1

## 2015-03-09 MED ORDER — HYDROXYZINE HCL 25 MG PO TABS
25.0000 mg | ORAL_TABLET | Freq: Four times a day (QID) | ORAL | Status: DC | PRN
  Administered 2015-03-09 – 2015-03-11 (×4): 25 mg via ORAL
  Filled 2015-03-09 (×6): qty 1

## 2015-03-09 MED ORDER — HALOPERIDOL 0.5 MG PO TABS
0.5000 mg | ORAL_TABLET | Freq: Three times a day (TID) | ORAL | Status: DC
  Administered 2015-03-09 – 2015-03-11 (×8): 0.5 mg via ORAL
  Filled 2015-03-09 (×4): qty 1
  Filled 2015-03-09: qty 21
  Filled 2015-03-09: qty 1
  Filled 2015-03-09: qty 21
  Filled 2015-03-09 (×3): qty 1
  Filled 2015-03-09: qty 21
  Filled 2015-03-09 (×2): qty 1

## 2015-03-09 MED ORDER — SIMVASTATIN 40 MG PO TABS
40.0000 mg | ORAL_TABLET | Freq: Every day | ORAL | Status: DC
  Administered 2015-03-09 – 2015-03-10 (×2): 40 mg via ORAL
  Filled 2015-03-09 (×2): qty 1
  Filled 2015-03-09: qty 2
  Filled 2015-03-09 (×2): qty 1

## 2015-03-09 MED ORDER — TRAZODONE HCL 100 MG PO TABS
100.0000 mg | ORAL_TABLET | Freq: Every evening | ORAL | Status: DC | PRN
  Administered 2015-03-09: 100 mg via ORAL
  Filled 2015-03-09 (×2): qty 1

## 2015-03-09 MED ORDER — ALBUTEROL SULFATE HFA 108 (90 BASE) MCG/ACT IN AERS: 2.0000 | INHALATION_SPRAY | RESPIRATORY_TRACT | Status: DC | PRN

## 2015-03-09 MED ORDER — PNEUMOCOCCAL VAC POLYVALENT 25 MCG/0.5ML IJ INJ: 0.5000 mL | INJECTION | INTRAMUSCULAR | Status: DC

## 2015-03-09 MED ORDER — METOPROLOL TARTRATE 50 MG PO TABS
50.0000 mg | ORAL_TABLET | Freq: Two times a day (BID) | ORAL | Status: DC
  Administered 2015-03-09 – 2015-03-11 (×4): 50 mg via ORAL
  Filled 2015-03-09 (×2): qty 1
  Filled 2015-03-09: qty 2
  Filled 2015-03-09 (×7): qty 1

## 2015-03-09 MED ORDER — FLUOXETINE HCL 10 MG PO CAPS
10.0000 mg | ORAL_CAPSULE | Freq: Every day | ORAL | Status: DC
  Administered 2015-03-09 – 2015-03-11 (×3): 10 mg via ORAL
  Filled 2015-03-09 (×5): qty 1
  Filled 2015-03-09: qty 7

## 2015-03-09 MED ORDER — MAGNESIUM HYDROXIDE 400 MG/5ML PO SUSP: 30.0000 mL | Freq: Every day | ORAL | Status: DC | PRN

## 2015-03-09 MED ORDER — ASPIRIN 81 MG PO CHEW
81.0000 mg | CHEWABLE_TABLET | Freq: Every day | ORAL | Status: DC
  Administered 2015-03-09 – 2015-03-11 (×3): 81 mg via ORAL
  Filled 2015-03-09 (×6): qty 1

## 2015-03-09 MED ORDER — INFLUENZA VAC SPLIT QUAD 0.5 ML IM SUSY: 0.5000 mL | PREFILLED_SYRINGE | INTRAMUSCULAR | Status: DC

## 2015-03-09 MED ORDER — TAMSULOSIN HCL 0.4 MG PO CAPS
0.4000 mg | ORAL_CAPSULE | Freq: Every day | ORAL | Status: DC
  Administered 2015-03-09 – 2015-03-11 (×3): 0.4 mg via ORAL
  Filled 2015-03-09 (×5): qty 1

## 2015-03-09 MED ORDER — ALUM & MAG HYDROXIDE-SIMETH 200-200-20 MG/5ML PO SUSP
30.0000 mL | ORAL | Status: DC | PRN
Start: 2015-03-09 — End: 2015-03-11

## 2015-03-09 MED ORDER — ACETAMINOPHEN 325 MG PO TABS: 650.0000 mg | ORAL_TABLET | Freq: Four times a day (QID) | ORAL | Status: DC | PRN

## 2015-03-09 MED ORDER — MOMETASONE FURO-FORMOTEROL FUM 100-5 MCG/ACT IN AERO
2.0000 | INHALATION_SPRAY | Freq: Two times a day (BID) | RESPIRATORY_TRACT | Status: DC
  Administered 2015-03-09 – 2015-03-11 (×5): 2 via RESPIRATORY_TRACT
  Filled 2015-03-09: qty 8.8

## 2015-03-09 MED ORDER — PNEUMOCOCCAL VAC POLYVALENT 25 MCG/0.5ML IJ INJ
0.5000 mL | INJECTION | Freq: Once | INTRAMUSCULAR | Status: AC
  Administered 2015-03-09: 0.5 mL via INTRAMUSCULAR

## 2015-03-09 MED ORDER — ENSURE ENLIVE PO LIQD: 237.0000 mL | Freq: Two times a day (BID) | ORAL | Status: DC

## 2015-03-09 MED ORDER — INFLUENZA VAC SPLIT QUAD 0.5 ML IM SUSY
0.5000 mL | PREFILLED_SYRINGE | Freq: Once | INTRAMUSCULAR | Status: AC
  Administered 2015-03-09: 0.5 mL via INTRAMUSCULAR
  Filled 2015-03-09: qty 0.5

## 2015-03-09 NOTE — Plan of Care (Signed)
Problem: Alteration in mood Goal: STG-Patient is able to discuss feelings and issues (Patient is able to discuss feelings and issues leading to depression)  Outcome: Progressing Pt discusses thoughts and feelings with staff.  Problem: Diagnosis: Increased Risk For Suicide Attempt Goal: LTG-Patient Will Report Improved Mood and Deny Suicidal LTG (by discharge) Patient will report improved mood and deny suicidal ideation.  Outcome: Not Progressing Pt verbalizes passive SI.

## 2015-03-09 NOTE — Progress Notes (Signed)
Pt attended the AA speaker meeting. Pt was attentive and engaged. 

## 2015-03-09 NOTE — Progress Notes (Signed)
NUTRITION ASSESSMENT  Pt identified as at risk on the Malnutrition Screen Tool  INTERVENTION: 1. Supplements: Ensure Enlive po BID, each supplement provides 350 kcal and 20 grams of protein  NUTRITION DIAGNOSIS: Unintentional weight loss related to sub-optimal intake as evidenced by pt report.   Goal: Pt to meet >/= 90% of their estimated nutrition needs.  Monitor:  PO intake  Assessment:  Pt admitted with ETOH and cocaine abuse. Pt possible homeless.  Per weight history, pt has lost 18 lb since 1 month ago (8% weight loss x 1 month, significant for time frame). Suspect poor quality diet PTA d/t substance abuse. RD to order Ensure supplements.   Height: Ht Readings from Last 1 Encounters:  03/08/15 5\' 10"  (1.778 m)    Weight: Wt Readings from Last 1 Encounters:  03/08/15 207 lb (93.895 kg)    Weight Hx: Wt Readings from Last 10 Encounters:  03/08/15 207 lb (93.895 kg)  03/08/15 212 lb (96.163 kg)  02/09/15 209 lb (94.802 kg)  02/08/15 225 lb (102.059 kg)  01/01/15 222 lb 14.2 oz (101.1 kg)  07/06/14 205 lb (92.987 kg)  02/25/14 223 lb (101.152 kg)  08/20/13 211 lb (95.709 kg)  05/12/13 211 lb 3.2 oz (95.8 kg)  04/27/13 194 lb (87.998 kg)    BMI:  Body mass index is 29.7 kg/(m^2). Pt meets criteria for overweight based on current BMI.  Estimated Nutritional Needs: Kcal: 25-30 kcal/kg Protein: > 1 gram protein/kg Fluid: 1 ml/kcal  Diet Order: Diet Heart Room service appropriate?: Yes; Fluid consistency:: Thin Pt is also offered choice of unit snacks mid-morning and mid-afternoon.  Pt is eating as desired.   Lab results and medications reviewed.   Tilda FrancoLindsey Khoen Genet, MS, RD, LDN Pager: [phone] After Hours Pager: [phone]

## 2015-03-09 NOTE — [facility] Suicide Risk Assessment (Signed)
[facility]   Nursing information obtained from:  Patient Demographic factors:  Male, Caucasian, Divorced or widowed Current Mental Status:  Suicidal ideation indicated by patient, Self-harm thoughts Loss Factors:  Financial problems / change in socioeconomic status Historical Factors:  Prior suicide attempts, Family history of suicide, Family history of mental illness or substance abuse, Impulsivity Risk Reduction Factors:  Sense of responsibility to family, Employed, Living with another person, especially a relative, Positive social support Total Time spent with patient: 45 minutes Principal Problem: Bipolar I disorder, most recent episode depressed (HCC) Diagnosis:   Patient Active Problem List   Diagnosis Date Noted  . Bipolar I disorder, most recent episode depressed (HCC) [F31.30] 03/09/2015  . Alcohol abuse [F10.10] 03/08/2015  . Cocaine abuse [F14.10] 03/08/2015  . Substance induced mood disorder (HCC) [F19.94] 03/08/2015  . Suicidal ideation [R45.851]   . MDD (major depressive disorder), recurrent episode, moderate (HCC) [F33.1] 02/11/2015  . COPD (chronic obstructive pulmonary disease) (HCC) [J44.9] 02/09/2015  . Tobacco use disorder [F17.200] 02/09/2015  . Stimulant use disorder (cocaine) [F15.90] 02/09/2015  . Alcohol use disorder, severe, in sustained remission (HCC) [F10.21] 02/09/2015  . Ureteral stone [N20.1] 12/31/2014  . 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 CAD s/p CABG 2012 [facility] [I25.810] 11/28/2011  . Hyperlipidemia LDL goal <100 [E78.5] 03/13/2007  . Essential hypertension [I10] 03/13/2007  . GERD [K21.9] 03/13/2007     Continued Clinical Symptoms:  Alcohol Use Disorder Identification Test Final Score (AUDIT): 6 The "Alcohol Use Disorders Identification Test", Guidelines for Use in Primary Care, Second Edition.  World Science writerHealth Organization [facility](WHO). Score between 0-7:  no or low risk or alcohol  related problems. Score between 8-15:  moderate risk of alcohol related problems. Score between 16-19:  high risk of alcohol related problems. Score 20 or above:  warrants further diagnostic evaluation for alcohol dependence and treatment.   CLINICAL FACTORS:   Bipolar Disorder:   Depressive phase Alcohol/Substance Abuse/Dependencies   Psychiatric Specialty Exam: Physical Exam  ROS  Blood pressure 115/69, pulse 73, temperature 97.7 F (36.5 C), temperature source Oral, resp. rate 16, height 5\' 10"  (1.778 m), weight 93.895 kg (207 lb), SpO2 96 %.Body mass index is 29.7 kg/(m^2).   COGNITIVE FEATURES THAT CONTRIBUTE TO RISK:  Closed-mindedness, Polarized thinking and Thought constriction (tunnel vision)    SUICIDE RISK:   Moderate:  Frequent suicidal ideation with limited intensity, and duration, some specificity in terms of plans, no associated intent, good self-control, limited dysphoria/symptomatology, some risk factors present, and identifiable protective factors, including available and accessible social support.  PLAN OF CARE: see admission H and P  Medical Decision Making:  Review of Psycho-Social Stressors (1), Review or order clinical lab tests (1), Review of Medication Regimen & Side Effects (2) and Review of New Medication or Change in Dosage (2)  I certify that inpatient services furnished can reasonably be expected to improve the patient's condition.   Cloyce Paterson A 03/09/2015, 6:05 PM

## 2015-03-09 NOTE — Tx Team (Signed)
Initial Interdisciplinary Treatment Plan   PATIENT STRESSORS: Financial difficulties Health problems Medication change or noncompliance   PATIENT STRENGTHS: Ability for insight Active sense of humor Average or above average intelligence Capable of independent living Communication skills General fund of knowledge Motivation for treatment/growth Physical Health   PROBLEM LIST: Problem List/Patient Goals Date to be addressed Date deferred Reason deferred Estimated date of resolution  "I want to get a handle on my depression 03/08/15     "Get me where I'm not so self destructive 03/08/15           Depression 03/08/2015     Increased risk for suicide 03/08/2015                              DISCHARGE CRITERIA:  Ability to meet basic life and health needs Adequate post-discharge living arrangements Improved stabilization in mood, thinking, and/or behavior Medical problems require only outpatient monitoring Motivation to continue treatment in a less acute level of care Need for constant or close observation no longer present Reduction of life-threatening or endangering symptoms to within safe limits Safe-care adequate arrangements made Verbal commitment to aftercare and medication compliance  PRELIMINARY DISCHARGE PLAN: Attend aftercare/continuing care group Outpatient therapy Participate in family therapy Return to previous living arrangement  PATIENT/FAMIILY INVOLVEMENT: This treatment plan has been presented to and reviewed with the patient, Steven ChangRoger L [name], and/or family member.  The patient and family have been given the opportunity to ask questions and make suggestions.  Steven [name], Steven [name] Steven [name] 03/09/2015, 12:21 AM
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
## 2015-03-09 NOTE — [facility] Group Notes (Signed)
[facility] LCSW Group Therapy 03/09/2015 1:15 PM Type of Therapy: Group Therapy Participation Level: Active  Participation Quality: Attentive, Sharing and Supportive  Affect: Depressed and Flat  Cognitive: Alert and Oriented  Insight: Developing/Improving and Engaged  Engagement in Therapy: Developing/Improving and Engaged  Modes of Intervention: Activity, Clarification, Confrontation, Discussion, Education, Exploration, Limit-setting, Orientation, Problem-solving, Rapport Building, Reality Testing, Socialization and Support  Summary of Progress/Problems: Patient was attentive and engaged with speaker from [employer]. Patient was attentive to speaker while they shared their story of dealing with mental health and overcoming it. Patient expressed interest in their programs and services and received information on their agency. Patient processed ways they can relate to the speaker.   Orlan Aversa, MSW, LCSWA Clinical Social Worker [facility] [phone]   

## 2015-03-09 NOTE — Progress Notes (Signed)
Met with patient this morning 1:1. Patient flat, depressed with brief eye contact. When asked about suicidal thoughts patient ambivalent stating, "who knows? Sorry to give a smart answer but I don't really care." "I won't do anything while I'm here because I'm getting help. But if I was to leave, I just don't know." Patient brighter as day progressed though ambivalence remains. Patient offered emotional support, reassurance. Medicated per orders. Patient denies HI and remains safe. Jamie Kato
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
## 2015-03-09 NOTE — Plan of Care (Signed)
Problem: Alteration in mood Goal: STG-Patient reports thoughts of self-harm to staff Outcome: Progressing Patient openly states he is ambivalent about suicide. "I can be safe in here because I'm getting help. If I wasn't here, I don't know. I don't really care."  Problem: Diagnosis: Increased Risk For Suicide Attempt Goal: STG-Patient Will Comply With Medication Regime Outcome: Progressing Patient has been med compliant.

## 2015-03-09 NOTE — Tx Team (Signed)
Initial Interdisciplinary Treatment Plan   PATIENT STRESSORS: Marital or family conflict Substance abuse   PATIENT STRENGTHS: Ability for insight Average or above average intelligence Capable of independent living General fund of knowledge   PROBLEM LIST: Problem List/Patient Goals Date to be addressed Date deferred Reason deferred Estimated date of resolution  Self harm behaviors "suicidal as hell"      Substance abuse      Medication noncompliance                                           DISCHARGE CRITERIA:  Ability to meet basic life and health needs Improved stabilization in mood, thinking, and/or behavior Motivation to continue treatment in a less acute level of care Reduction of life-threatening or endangering symptoms to within safe limits Verbal commitment to aftercare and medication compliance  PRELIMINARY DISCHARGE PLAN: Attend PHP/IOP Participate in family therapy Placement in alternative living arrangements  PATIENT/FAMIILY INVOLVEMENT: This treatment plan has been presented to and reviewed with the patient, Billey ChangRoger L Sahlin, and/or family member.  The patient and family have been given the opportunity to ask questions and make suggestions.  Violeta Lecount L Parsells 03/09/2015, 11:40 PM

## 2015-03-09 NOTE — [facility] Group Notes (Signed)
[facility] LCSW Group Therapy  03/09/2015 1:05 PM  Type of Therapy:  Group Therapy  Participation Level:  Did Not Attend  Modes of Intervention:  Discussion, Education, Exploration, Problem-solving, Rapport Building, Socialization and Support  Summary of Progress/Problems: [facility] Speaker came to talk about his personal journey with substance abuse and addiction. The pt processed ways by which to relate to the speaker. [facility] speaker provided handouts and educational information pertaining to groups and services offered by the [facility].   Smart, Blas Riches LCSWA  03/09/2015, 1:05 PM

## 2015-03-09 NOTE — [facility] Group Notes (Signed)
[facility] Group Notes:  (Nursing/MHT/Case Management/Adjunct)  Date:  03/09/2015  Time:  0900  Type of Therapy:  Nurse Education  Participation Level:  Did Not Attend  Participation Quality:    Affect:    Cognitive:    Insight:    Engagement in Group:    Modes of Intervention:    Summary of Progress/Problems: patient was invited however elected to remain in bed.  Merian CapronFriedman, Tahiry Spicer [facility] 03/09/2015, 0930
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
## 2015-03-09 NOTE — [facility] Suicide Risk Assessment (Signed)
[facility] INPATIENT:  Family/Significant Other Suicide Prevention Education  Suicide Prevention Education:  Patient Refusal for Family/Significant Other Suicide Prevention Education: The patient Steven [name] has refused to provide written consent for family/significant other to be provided Family/Significant Other Suicide Prevention Education during admission and/or prior to discharge.  Physician notified.  Steven [name], Steven [name] 03/09/2015, 11:55 AM
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
## 2015-03-10 MED ORDER — NICOTINE 21 MG/24HR TD PT24
21.0000 mg | MEDICATED_PATCH | Freq: Every day | TRANSDERMAL | Status: DC
  Administered 2015-03-11: 21 mg via TRANSDERMAL
  Filled 2015-03-10 (×3): qty 1

## 2015-03-10 MED ORDER — TRAZODONE HCL 100 MG PO TABS
100.0000 mg | ORAL_TABLET | Freq: Once | ORAL | Status: AC
  Administered 2015-03-10: 100 mg via ORAL

## 2015-03-10 MED ORDER — HYDROXYZINE HCL 25 MG PO TABS
25.0000 mg | ORAL_TABLET | Freq: Once | ORAL | Status: AC
  Administered 2015-03-10: 25 mg via ORAL

## 2015-03-10 MED ORDER — TRAZODONE HCL 100 MG PO TABS
200.0000 mg | ORAL_TABLET | Freq: Every evening | ORAL | Status: DC | PRN
  Administered 2015-03-10: 200 mg via ORAL
  Filled 2015-03-10: qty 14
  Filled 2015-03-10: qty 2

## 2015-03-10 MED ORDER — LORAZEPAM 1 MG PO TABS
1.0000 mg | ORAL_TABLET | Freq: Four times a day (QID) | ORAL | Status: DC | PRN
Start: 2015-03-10 — End: 2015-03-11
  Administered 2015-03-10 – 2015-03-11 (×3): 1 mg via ORAL
  Filled 2015-03-10 (×4): qty 1

## 2015-03-10 NOTE — [facility] Group Notes (Signed)
[facility] LCSW Group Therapy  03/10/2015 12:31 PM  Type of Therapy:  Group Therapy  Participation Level:  Did Not Attend-pt invited. Chose not attend.   Modes of Intervention:  Confrontation, Discussion, Education, Exploration, Problem-solving, Rapport Building, Socialization and Support  Summary of Progress/Problems: Today's Topic: Overcoming Obstacles. Patients identified one short term goal and potential obstacles in reaching this goal. Patients processed barriers involved in overcoming these obstacles. Patients identified steps necessary for overcoming these obstacles and explored motivation (internal and external) for facing these difficulties head on.   Smart, Onna Nodal LCSWA  03/10/2015, 12:31 PM

## 2015-03-10 NOTE — Plan of Care (Signed)
Problem: Diagnosis: Increased Risk For Suicide Attempt Goal: STG-Patient Will Report Suicidal Feelings to Staff Outcome: Progressing Patient endorsing passive SI however verbally contracts for safety.  Problem: Ineffective individual coping Goal: STG: Patient will remain free from self harm Outcome: Progressing Patient has not engaged in self harm. Contracts for safety.

## 2015-03-10 NOTE — Progress Notes (Signed)
[facility] MD Progress Note  03/10/2015 4:55 PM OMARIO ANDER  MRN:  [id number] Subjective:  Steven [name] is having a hard time. He is still endorsing anxiety feeling restless and his mood being all over the place. He states that the situation with his uncle is producing a lot of stress for him. He knows that once he gets the check he is expecting, he will be able to get a new place to live. So he thinks is more than that. He did not sleep well last night. It required two different dosages of Trazodone 100 and a Vistaril 25 early morning. He then felt groggy when he got up. He is still endorsing suicidal thoughts that tend to be chronic in nature.  Principal Problem: Bipolar I disorder, most recent episode depressed (HCC) Diagnosis:   Patient Active Problem List   Diagnosis Date Noted  . Bipolar I disorder, most recent episode depressed (HCC) [F31.30] 03/09/2015  . Alcohol abuse [F10.10] 03/08/2015  . Cocaine abuse [F14.10] 03/08/2015  . Substance induced mood disorder (HCC) [F19.94] 03/08/2015  . Suicidal ideation [R45.851]   . MDD (major depressive disorder), recurrent episode, moderate (HCC) [F33.1] 02/11/2015  . COPD (chronic obstructive pulmonary disease) (HCC) [J44.9] 02/09/2015  . Tobacco use disorder [F17.200] 02/09/2015  . Stimulant use disorder (cocaine) [F15.90] 02/09/2015  . Alcohol use disorder, severe, in sustained remission (HCC) [F10.21] 02/09/2015  . Ureteral stone [N20.1] 12/31/2014  . 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 CAD s/p CABG 2012 [facility] [I25.810] 11/28/2011  . Hyperlipidemia LDL goal <100 [E78.5] 03/13/2007  . Essential hypertension [I10] 03/13/2007  . GERD [K21.9] 03/13/2007   Total Time spent with patient: 30 minutes  Past Psychiatric History: see admission H and P  Past Medical History:  Past Medical History  Diagnosis Date  . Coronary artery disease   . Hypertension   . Hypercholesteremia   . Kidney stone   . Tobacco use  disorder   . Hx of CABG   . COPD (chronic obstructive pulmonary disease) (HCC)   . Bipolar disorder (manic depression) (HCC)   . Asthma     Past Surgical History  Procedure Laterality Date  . Coronary artery bypass graft    . Kidney stone surgery    . Appendectomy    . Lower extremity angiogram N/A 08/15/2012    Procedure: LOWER EXTREMITY ANGIOGRAM with possible PTA /stent;  Surgeon: Corky Crafts, MD;  Location: [facility] CATH LAB;  Service: Cardiovascular;  Laterality: N/A;  . Abdominal angiogram  08/15/2012    Procedure: ABDOMINAL ANGIOGRAM;  Surgeon: Corky Crafts, MD;  Location: [facility] CATH LAB;  Service: Cardiovascular;;  . Cardioversion N/A 08/28/2012    Procedure: Pseudo Compression;  Surgeon: Chuck Hint, MD;  Location: [facility] CATH LAB;  Service: Cardiovascular;  Laterality: N/A;  . Cystoscopy with retrograde pyelogram, ureteroscopy and stent placement Right 12/31/2014    Procedure: CYSTOSCOPY  RIGHT URETEROSCOPY WITH STONE RETREIVAL RIGHT RETROGRADE PYELOGRAM;  Surgeon: Malen Gauze, MD;  Location: [facility] ORS;  Service: Urology;  Laterality: Right;   Family History:  Family History  Problem Relation Age of Onset  . Heart disease Father   . Hyperlipidemia Father   . Heart disease Maternal Grandmother    Family Psychiatric  History: see admission H and P Social History:  History  Alcohol Use  . Yes    Comment: last drink 03/07/2015      History  Drug Use  . Yes  . Special: Cocaine  Comment: 10/23//2016    Social History   Social History  . Marital Status: Divorced    Spouse Name: N/A  . Number of Children: N/A  . Years of Education: N/A   Social History Main Topics  . Smoking status: Current Every Day Smoker -- 1.00 packs/day for 31 years    Types: Cigarettes  . Smokeless tobacco: Former Neurosurgeon    Quit date: 04/27/2013  . Alcohol Use: Yes     Comment: last drink 03/07/2015   . Drug Use: Yes    Special: Cocaine     Comment: 10/23//2016  . Sexual  Activity: Yes    Birth Control/ Protection: Condom   Other Topics Concern  . None   Social History Narrative   Additional Social History:    Pain Medications: see mar Prescriptions: see mar Over the Counter: see mar History of alcohol / drug use?: Yes Longest period of sobriety (when/how long): 18 months Apr 14 2013 to Mar 07 2015 Negative Consequences of Use: Financial, Legal, Personal relationships Withdrawal Symptoms: Other (Comment) (anxiety) Name of Substance 1: same as below Name of Substance 2: same as below                Sleep: Fair  Appetite:  Fair  Current Medications: Current Facility-Administered Medications  Medication Dose Route Frequency Provider Last Rate Last Dose  . acetaminophen (TYLENOL) tablet 650 mg  650 mg Oral Q6H PRN Charm Rings, NP      . albuterol (PROVENTIL HFA;VENTOLIN HFA) 108 (90 BASE) MCG/ACT inhaler 2 puff  2 puff Inhalation Q4H PRN Charm Rings, NP      . alum & mag hydroxide-simeth (MAALOX/MYLANTA) 200-200-20 MG/5ML suspension 30 mL  30 mL Oral Q4H PRN Charm Rings, NP      . aspirin chewable tablet 81 mg  81 mg Oral Daily Charm Rings, NP   81 mg at 03/10/15 0817  . divalproex (DEPAKOTE) DR tablet 250 mg  250 mg Oral TID Charm Rings, NP   250 mg at 03/10/15 1645  . feeding supplement (ENSURE ENLIVE) (ENSURE ENLIVE) liquid 237 mL  237 mL Oral BID BM Tilda Franco, RD   237 mL at 03/10/15 1000  . FLUoxetine (PROZAC) capsule 10 mg  10 mg Oral Daily Charm Rings, NP   10 mg at 03/10/15 1610  . haloperidol (HALDOL) tablet 0.5 mg  0.5 mg Oral TID Charm Rings, NP   0.5 mg at 03/10/15 1645  . hydrOXYzine (ATARAX/VISTARIL) tablet 25 mg  25 mg Oral Q6H PRN Kerry Hough, PA-C   25 mg at 03/10/15 1207  . LORazepam (ATIVAN) tablet 1 mg  1 mg Oral Q6H PRN Rachael Fee, MD   1 mg at 03/10/15 1540  . magnesium hydroxide (MILK OF MAGNESIA) suspension 30 mL  30 mL Oral Daily PRN Charm Rings, NP      . metoprolol (LOPRESSOR) tablet  50 mg  50 mg Oral BID Charm Rings, NP   Stopped at 03/10/15 1700  . mometasone-formoterol (DULERA) 100-5 MCG/ACT inhaler 2 puff  2 puff Inhalation BID Charm Rings, NP   2 puff at 03/10/15 1646  . [START ON 03/11/2015] nicotine (NICODERM CQ - dosed in mg/24 hours) patch 21 mg  21 mg Transdermal Daily Rachael Fee, MD      . pantoprazole (PROTONIX) EC tablet 40 mg  40 mg Oral Daily Rachael Fee, MD   40 mg at 03/10/15 0817  .  simvastatin (ZOCOR) tablet 40 mg  40 mg Oral QHS Charm RingsJamison Y Lord, NP   40 mg at 03/09/15 2111  . tamsulosin (FLOMAX) capsule 0.4 mg  0.4 mg Oral Daily Charm RingsJamison Y Lord, NP   0.4 mg at 03/10/15 0817  . traZODone (DESYREL) tablet 200 mg  200 mg Oral QHS PRN Rachael FeeIrving A Satish Hammers, MD        Lab Results: No results found for this or any previous visit (from the past 48 hour(s)).  Physical Findings: AIMS: Facial and Oral Movements Muscles of Facial Expression: None, normal Lips and Perioral Area: None, normal Jaw: None, normal Tongue: None, normal,Extremity Movements Upper (arms, wrists, hands, fingers): None, normal Lower (legs, knees, ankles, toes): None, normal, Trunk Movements Neck, shoulders, hips: None, normal, Overall Severity Severity of abnormal movements (highest score from questions above): None, normal Incapacitation due to abnormal movements: None, normal Patient's awareness of abnormal movements (rate only patient's report): No Awareness, Dental Status Current problems with teeth and/or dentures?: Yes ("wore out. I need to get dentures") Does patient usually wear dentures?: No  CIWA:  CIWA-Ar Total: 0 COWS:     Musculoskeletal: Strength & Muscle Tone: within normal limits Gait & Station: normal Patient leans: normal  Psychiatric Specialty Exam: Review of Systems  Constitutional: Negative.   HENT: Negative.   Eyes: Negative.   Respiratory: Negative.   Cardiovascular: Negative.   Gastrointestinal: Negative.   Genitourinary: Negative.    Musculoskeletal: Negative.   Skin: Negative.   Neurological: Negative.   Endo/Heme/Allergies: Negative.   Psychiatric/Behavioral: Positive for depression and substance abuse. The patient is nervous/anxious and has insomnia.     Blood pressure 116/63, pulse 77, temperature 97.7 F (36.5 C), temperature source Oral, resp. rate 18, height 5\' 10"  (1.778 m), weight 93.895 kg (207 lb), SpO2 96 %.Body mass index is 29.7 kg/(m^2).  General Appearance: Fairly Groomed  Patent attorneyye Contact::  Minimal  Speech:  Clear and Coherent  Volume:  fluctuates  Mood:  Anxious and Dysphoric  Affect:  anxious worried  Thought Process:  circunstantial  Orientation:  Full (Time, Place, and Person)  Thought Content:  symptoms events worries concerns  Suicidal Thoughts:  No  Homicidal Thoughts:  No  Memory:  Immediate;   Fair Recent;   Fair Remote;   Fair  Judgement:  Fair  Insight:  Present and Shallow  Psychomotor Activity:  Restlessness  Concentration:  Fair  Recall:  FiservFair  Fund of Knowledge:Fair  Language: Fair  Akathisia:  No  Handed:  Right  AIMS (if indicated):     Assets:  Desire for Improvement Housing  ADL's:  Intact  Cognition: WNL  Sleep:  Number of Hours: 4.25   Treatment Plan Summary: Daily contact with patient to assess and evaluate symptoms and progress in treatment and Medication management Supportive approach/coping skills Insomnia; will increase the Trazodone to 200 mg HS Anxiety; will continue the Vistaril 25 gm Q 6 what he found effective Mood instability; will get a Depakote level and optimize response Substance abuse; will continue to work a relapse prevention plan Use CBT/mindfulness Idalis Hoelting A 03/10/2015, 4:55 PM

## 2015-03-10 NOTE — Progress Notes (Signed)
Pt was invited to attend the NA speaker meeting, however, pt responded by saying "NA's for quitters." And stayed in his room and slept.

## 2015-03-10 NOTE — Progress Notes (Signed)
Patient continues to be depressed, flat, sad. Rating depression and hopelessness both at a 9/10 and anxiety at a 10/10. Endorsing passive SI but verbally contracts for safety. Goal is "to not hurt myself" and states he will meet this goal by "going to staff when I feel suicidal." Patient ambivalent at times and isolative to room during downtime in between groups. Minimal interaction with peers. Patient given emotional support and reassurance. Medicated per orders. Discussed with MD and tx team. Denies pain, physical problems though reports poor sleep. Denies HI and remains safe. Will continue to monitor closely. Lawrence MarseillesFriedman, Allene Furuya Eakes

## 2015-03-10 NOTE — Progress Notes (Signed)
Recreation Therapy Notes  Date: 10.26.2016 Time: 9:30am Location: [address] Group Room   Group Topic: Stress Management  Goal Area(s) Addresses:  Patient will actively participate in stress management techniques presented during session.   Behavioral Response: Did not attend.   Yuvin Bussiere L Carlei Huang, LRT/CTRS        Gladine Plude L 03/10/2015 2:49 PM 

## 2015-03-11 ENCOUNTER — Encounter (HOSPITAL_COMMUNITY): Payer: Self-pay | Admitting: Family

## 2015-03-11 LAB — VALPROIC ACID LEVEL: Valproic Acid Lvl: 33 ug/mL — ABNORMAL LOW (ref 50.0–100.0)

## 2015-03-11 MED ORDER — ALBUTEROL SULFATE HFA 108 (90 BASE) MCG/ACT IN AERS: 2.0000 | INHALATION_SPRAY | RESPIRATORY_TRACT | Status: DC | PRN

## 2015-03-11 MED ORDER — METOPROLOL TARTRATE 50 MG PO TABS: 50.0000 mg | ORAL_TABLET | Freq: Two times a day (BID) | ORAL | Status: DC

## 2015-03-11 MED ORDER — DIVALPROEX SODIUM 250 MG PO DR TAB: 250.0000 mg | DELAYED_RELEASE_TABLET | Freq: Three times a day (TID) | ORAL | Status: DC

## 2015-03-11 MED ORDER — PANTOPRAZOLE SODIUM 40 MG PO TBEC: 40.0000 mg | DELAYED_RELEASE_TABLET | Freq: Every day | ORAL | Status: DC

## 2015-03-11 MED ORDER — TRAZODONE HCL 100 MG PO TABS: 200.0000 mg | ORAL_TABLET | Freq: Every evening | ORAL | Status: DC | PRN

## 2015-03-11 MED ORDER — HYDROXYZINE HCL 25 MG PO TABS
25.0000 mg | ORAL_TABLET | Freq: Three times a day (TID) | ORAL | Status: DC | PRN
  Filled 2015-03-11: qty 9

## 2015-03-11 MED ORDER — HALOPERIDOL 0.5 MG PO TABS: 0.5000 mg | ORAL_TABLET | Freq: Three times a day (TID) | ORAL | Status: DC

## 2015-03-11 MED ORDER — FLUOXETINE HCL 10 MG PO CAPS: 10.0000 mg | ORAL_CAPSULE | Freq: Every day | ORAL | Status: DC

## 2015-03-11 MED ORDER — HYDROXYZINE HCL 25 MG PO TABS: 25.0000 mg | ORAL_TABLET | Freq: Three times a day (TID) | ORAL | Status: DC | PRN

## 2015-03-11 NOTE — [facility] Group Notes (Signed)
The focus of this group is to educate the patient on the purpose and policies of crisis stabilization and provide a format to answer questions about their admission.  The group details unit policies and expectations of patients while admitted.  Patient did not attend 0900 nurse education orientation group this morning.  Patient stayed in bed.   

## 2015-03-11 NOTE — Progress Notes (Signed)
D-  Patient has been isolative accept for attending groups, patient denies SI, HI and AVH.  Patient stated "I am feeling all right" but would now elaborate to writer about his mood.  Patient has a low level of irritability but has attended groups, complied with therapy and has had no behavioral dyscontrol this shift. Patient has been preparing for discharge as it is scheduled for today.   A- assess patient for safety, offer medications as prescribed, encouraged patient to utilize coping skills to decrease depression and anxiety.  Educated patient about discharge   R-  Patient stated understanding of discharge instructions, patient became more talkative and smiled upon discharge during conversation.  Patient stated the importance of following up with his physician after discharge.  Patient denied SI, HI, and AVH.  All belonging returned to patient.
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
## 2015-03-11 NOTE — [facility] Suicide Risk Assessment (Signed)
[name]   Demographic Factors:  Male and Caucasian  Total Time spent with patient: 30 minutes  Musculoskeletal: Strength & Muscle Tone: within normal limits Gait & Station: normal Patient leans: normal  Psychiatric Specialty Exam: Physical Exam  Review of Systems  Constitutional: Negative.   HENT: Negative.   Eyes: Negative.   Respiratory: Negative.   Cardiovascular: Negative.   Gastrointestinal: Negative.   Genitourinary: Negative.   Musculoskeletal: Negative.   Skin: Negative.   Neurological: Negative.   Endo/Heme/Allergies: Negative.   Psychiatric/Behavioral: Positive for substance abuse. The patient is nervous/anxious.     Blood pressure 127/66, pulse 81, temperature 97.4 F (36.3 C), temperature source Oral, resp. rate 18, height 5\' 10"  (1.778 m), weight 93.895 kg (207 lb), SpO2 96 %.Body mass index is 29.7 kg/(m^2).  General Appearance: Fairly Groomed  Patent attorneyye Contact::  Fair  Speech:  Clear and Coherent409  Volume:  Normal  Mood:  Euthymic  Affect:  Appropriate  Thought Process:  Coherent and Goal Directed  Orientation:  Full (Time, Place, and Person)  Thought Content:  plans as he moves on, relapse prevention plan  Suicidal Thoughts:  No  Homicidal Thoughts:  No  Memory:  Immediate;   Fair Recent;   Fair Remote;   Fair  Judgement:  Fair  Insight:  Present and Shallow  Psychomotor Activity:  Restlessness  Concentration:  Fair  Recall:  FiservFair  Fund of Knowledge:Fair  Language: Fair  Akathisia:  No  Handed:  Right  AIMS (if indicated):     Assets:  Desire for Improvement Housing Social Support  Sleep:  Number of Hours: 6  Cognition: WNL  ADL's:  Intact   Have you used any form of tobacco in the last 30 days? (Cigarettes, Smokeless Tobacco, Cigars, and/or Pipes): Yes  Has this patient used any form of tobacco in the last 30 days? (Cigarettes, Smokeless Tobacco, Cigars, and/or Pipes) Yes, A prescription for an FDA-approved  tobacco cessation medication was offered at discharge and the patient refused  Mental Status Per Nursing Assessment::   On Admission:  Suicidal ideation indicated by patient, Self-harm thoughts  Current Mental Status by Physician: In full contact with reality. There are no active S/S of withdrawal. There are no active SI plans or intent. He states he got his check today. He states he feels ready to be D/C and do the things he needs to do to get her life back together. He is going to work towards getting a new place to live. Living by himself will eliminate the stress he was under living with his uncle   Loss Factors: NA  Historical Factors: NA  Risk Reduction Factors:   Sense of responsibility to family and Positive social support  Continued Clinical Symptoms:  Bipolar Disorder:   Depressive phase Alcohol/Substance Abuse/Dependencies  Cognitive Features That Contribute To Risk:  None    Suicide Risk:  Minimal: No identifiable suicidal ideation.  Patients presenting with no risk factors but with morbid ruminations; may be classified as minimal risk based on the severity of the depressive symptoms  Principal Problem: Bipolar I disorder, most recent episode depressed [name](HCC) Discharge Diagnoses:  Patient Active Problem List   Diagnosis Date Noted  . Bipolar I disorder, most recent episode depressed (HCC) [F31.30] 03/09/2015  . Alcohol abuse [F10.10] 03/08/2015  . Cocaine abuse [F14.10] 03/08/2015  . Substance induced mood disorder (HCC) [F19.94] 03/08/2015  . Suicidal ideation [R45.851]   . MDD (major depressive disorder), recurrent episode, moderate (HCC) [  F33.1] 02/11/2015  . COPD (chronic obstructive pulmonary disease) (HCC) [J44.9] 02/09/2015  . Tobacco use disorder [F17.200] 02/09/2015  . Stimulant use disorder (cocaine) [F15.90] 02/09/2015  . Alcohol use disorder, severe, in sustained remission (HCC) [F10.21] 02/09/2015  . Ureteral stone [N20.1] 12/31/2014  . Claudication  of left lower extremity (HCC) [I73.9] 08/14/2012  . CAD s/p CABG 2012 [facility] [I25.810] 11/28/2011  . Hyperlipidemia LDL goal <100 [E78.5] 03/13/2007  . Essential hypertension [I10] 03/13/2007  . GERD [K21.9] 03/13/2007    Follow-up Information    Follow up with Patient declines all follow up, does not want information sent to any providers.      Plan Of Care/Follow-up recommendations:  Activity:  as tolerated Diet:  regular Follow up [facility] ( Depakote level 33. Will need to be adjusted when he goes for follow up if clinically indicated) Is patient on multiple antipsychotic therapies at discharge:  No   Has Patient had three or more failed trials of antipsychotic monotherapy by history:  No  Recommended Plan for Multiple Antipsychotic Therapies: NA    Darriel Utter A 03/11/2015, 1:39 PM

## 2015-03-11 NOTE — Progress Notes (Signed)
D. Pt had been up and visible in milieu this evening, chose not to participate in evening group activity. Pt spoke about some on-going problems with sleep, anxiety and also recurrent suicidal thoughts. Pt did have difficulties sleeping this evening has appeared anxious and agitated this evening due to not being able to sleep well.  A. Support provided, medication education given. R. Pt verbalized understanding, safety maintained.

## 2015-03-11 NOTE — Progress Notes (Signed)
  [facility] :  Will you be returning to the same living situation after discharge:  Yes,  return home with uncle-plans to find new housing in the next few weeks At discharge, do you have transportation home?: Yes,  motorcycle in parking lot Do you have the ability to pay for your medications: Yes,  mental health  Release of information consent forms completed and submitted to medical records for med management.   Patient to Follow up at: Follow-up Information    Follow up with Patient declines all follow up, does not want information sent to any providers.      Patient denies SI/HI: Yes,  during group/self report.    Safety Planning and Suicide Prevention discussed: Yes,  SPE completed with pt, as he refused to consent to family contact.  Have you used any form of tobacco in the last 30 days? (Cigarettes, Smokeless Tobacco, Cigars, and/or Pipes): Yes  Has patient been referred to the Quitline?: Patient refused referral  Smart, Lebron QuamHeather LCSWA  03/11/2015, 1:26 PM
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
## 2015-04-01 ENCOUNTER — Encounter (HOSPITAL_COMMUNITY): Payer: Self-pay | Admitting: Emergency Medicine

## 2015-04-01 ENCOUNTER — Emergency Department (HOSPITAL_COMMUNITY)
Admission: EM | Admit: 2015-04-01 | Discharge: 2015-04-02 | Disposition: A | Discharge status: Other Acute Inpatient Hospital | Payer: Federal, State, Local not specified - Other | Attending: Emergency Medicine | Admitting: Emergency Medicine

## 2015-04-01 DIAGNOSIS — R4689 Other symptoms and signs involving appearance and behavior: Secondary | ICD-10-CM

## 2015-04-01 DIAGNOSIS — F1410 Cocaine abuse, uncomplicated: Secondary | ICD-10-CM | POA: Diagnosis present

## 2015-04-01 DIAGNOSIS — F3130 Bipolar disorder, current episode depressed, mild or moderate severity, unspecified: Secondary | ICD-10-CM | POA: Diagnosis present

## 2015-04-01 DIAGNOSIS — Z87442 Personal history of urinary calculi: Secondary | ICD-10-CM | POA: Insufficient documentation

## 2015-04-01 DIAGNOSIS — Z79899 Other long term (current) drug therapy: Secondary | ICD-10-CM | POA: Insufficient documentation

## 2015-04-01 DIAGNOSIS — I10 Essential (primary) hypertension: Secondary | ICD-10-CM | POA: Insufficient documentation

## 2015-04-01 DIAGNOSIS — R4589 Other symptoms and signs involving emotional state: Secondary | ICD-10-CM

## 2015-04-01 DIAGNOSIS — F17210 Nicotine dependence, cigarettes, uncomplicated: Secondary | ICD-10-CM | POA: Insufficient documentation

## 2015-04-01 DIAGNOSIS — F1020 Alcohol dependence, uncomplicated: Secondary | ICD-10-CM | POA: Diagnosis present

## 2015-04-01 DIAGNOSIS — Z951 Presence of aortocoronary bypass graft: Secondary | ICD-10-CM | POA: Insufficient documentation

## 2015-04-01 DIAGNOSIS — Z8639 Personal history of other endocrine, nutritional and metabolic disease: Secondary | ICD-10-CM | POA: Insufficient documentation

## 2015-04-01 DIAGNOSIS — I2510 Atherosclerotic heart disease of native coronary artery without angina pectoris: Secondary | ICD-10-CM | POA: Insufficient documentation

## 2015-04-01 DIAGNOSIS — J449 Chronic obstructive pulmonary disease, unspecified: Secondary | ICD-10-CM | POA: Insufficient documentation

## 2015-04-01 DIAGNOSIS — Z7982 Long term (current) use of aspirin: Secondary | ICD-10-CM | POA: Insufficient documentation

## 2015-04-01 LAB — RAPID URINE DRUG SCREEN, HOSP PERFORMED
Amphetamines: NOT DETECTED
BARBITURATES: NOT DETECTED
Benzodiazepines: NOT DETECTED
COCAINE: POSITIVE — AB
Opiates: NOT DETECTED
TETRAHYDROCANNABINOL: NOT DETECTED

## 2015-04-01 LAB — COMPREHENSIVE METABOLIC PANEL
ALBUMIN: 4.4 g/dL (ref 3.5–5.0)
ALT: 40 U/L (ref 17–63)
AST: 28 U/L (ref 15–41)
Alkaline Phosphatase: 75 U/L (ref 38–126)
Anion gap: 13 (ref 5–15)
BUN: 15 mg/dL (ref 6–20)
CHLORIDE: 104 mmol/L (ref 101–111)
CO2: 23 mmol/L (ref 22–32)
CREATININE: 1.03 mg/dL (ref 0.61–1.24)
Calcium: 9.6 mg/dL (ref 8.9–10.3)
GFR calc Af Amer: >60 mL/min (ref >60)
GLUCOSE: 103 mg/dL — AB (ref 65–99)
POTASSIUM: 3.8 mmol/L (ref 3.5–5.1)
SODIUM: 140 mmol/L (ref 135–145)
Total Bilirubin: 0.7 mg/dL (ref 0.3–1.2)
Total Protein: 7.9 g/dL (ref 6.5–8.1)

## 2015-04-01 LAB — SALICYLATE LEVEL

## 2015-04-01 LAB — CBC
HEMATOCRIT: 45.9 % (ref 39.0–52.0)
HEMOGLOBIN: 15.9 g/dL (ref 13.0–17.0)
MCH: 33.1 pg (ref 26.0–34.0)
MCHC: 34.6 g/dL (ref 30.0–36.0)
MCV: 95.6 fL (ref 78.0–100.0)
Platelets: 301 10*3/uL (ref 150–400)
RBC: 4.8 MIL/uL (ref 4.22–5.81)
RDW: 12.9 % (ref 11.5–15.5)
WBC: 7.2 10*3/uL (ref 4.0–10.5)

## 2015-04-01 LAB — ETHANOL: Alcohol, Ethyl (B): 102 mg/dL — ABNORMAL HIGH (ref <5)

## 2015-04-01 LAB — ACETAMINOPHEN LEVEL

## 2015-04-01 MED ORDER — ASPIRIN EC 325 MG PO TBEC
325.0000 mg | DELAYED_RELEASE_TABLET | Freq: Every day | ORAL | Status: DC
  Administered 2015-04-01 – 2015-04-02 (×2): 325 mg via ORAL
  Filled 2015-04-01 (×2): qty 1

## 2015-04-01 MED ORDER — ALUM & MAG HYDROXIDE-SIMETH 200-200-20 MG/5ML PO SUSP: 30.0000 mL | ORAL | Status: DC | PRN

## 2015-04-01 MED ORDER — FLUOXETINE HCL 10 MG PO CAPS
10.0000 mg | ORAL_CAPSULE | Freq: Every day | ORAL | Status: DC
  Administered 2015-04-01 – 2015-04-02 (×2): 10 mg via ORAL
  Filled 2015-04-01 (×2): qty 1

## 2015-04-01 MED ORDER — TRAZODONE HCL 100 MG PO TABS: 100.0000 mg | ORAL_TABLET | Freq: Every evening | ORAL | Status: DC | PRN

## 2015-04-01 MED ORDER — METOPROLOL TARTRATE 25 MG PO TABS
50.0000 mg | ORAL_TABLET | Freq: Two times a day (BID) | ORAL | Status: DC
  Administered 2015-04-01 – 2015-04-02 (×2): 50 mg via ORAL
  Filled 2015-04-01 (×3): qty 2

## 2015-04-01 MED ORDER — ONDANSETRON HCL 4 MG PO TABS: 4.0000 mg | ORAL_TABLET | Freq: Three times a day (TID) | ORAL | Status: DC | PRN

## 2015-04-01 MED ORDER — NICOTINE 21 MG/24HR TD PT24
21.0000 mg | MEDICATED_PATCH | Freq: Every day | TRANSDERMAL | Status: DC
  Administered 2015-04-01 – 2015-04-02 (×2): 21 mg via TRANSDERMAL
  Filled 2015-04-01 (×2): qty 1

## 2015-04-01 MED ORDER — IBUPROFEN 200 MG PO TABS
600.0000 mg | ORAL_TABLET | Freq: Three times a day (TID) | ORAL | Status: DC | PRN
Start: 2015-04-01 — End: 2015-04-02
  Administered 2015-04-02: 600 mg via ORAL

## 2015-04-01 MED ORDER — LORAZEPAM 1 MG PO TABS
1.0000 mg | ORAL_TABLET | Freq: Three times a day (TID) | ORAL | Status: DC | PRN
  Filled 2015-04-01: qty 1

## 2015-04-01 NOTE — ED Provider Notes (Signed)
CSN: [id number]     Arrival date & time 04/01/15  0818 History   First MD Initiated Contact with Patient 04/01/15 1029     Chief Complaint  Patient presents with  . Suicidal     (Consider location/radiation/quality/duration/timing/severity/associated sxs/prior Treatment) HPI  44-year-old male with a history of coronary disease, hypertension, nephrolithiasis, COPD and bipolar presents with worsening depression and suicidal ideation. Has a plan to overdose on heroin. No new stressors just progressively worsening depression over the last couple weeks since discharge from the facility. Has not taken medication secondary to decreased oriented behavior.  Past Medical History  Diagnosis Date  . Coronary artery disease   . Hypertension   . Hypercholesteremia   . Kidney stone   . Tobacco use disorder   . Hx of CABG   . COPD (chronic obstructive pulmonary disease) (HCC)   . Bipolar disorder (manic depression) (HCC)   . Asthma    Past Surgical History  Procedure Laterality Date  . Coronary artery bypass graft    . Kidney stone surgery    . Appendectomy    . Lower extremity angiogram N/A 08/15/2012    Procedure: LOWER EXTREMITY ANGIOGRAM with possible PTA /stent;  Surgeon: Corky Crafts, MD;  Location: [facility] CATH LAB;  Service: Cardiovascular;  Laterality: N/A;  . Abdominal angiogram  08/15/2012    Procedure: ABDOMINAL ANGIOGRAM;  Surgeon: Corky Crafts, MD;  Location: [facility] CATH LAB;  Service: Cardiovascular;;  . Cardioversion N/A 08/28/2012    Procedure: Pseudo Compression;  Surgeon: Chuck Hint, MD;  Location: [facility] CATH LAB;  Service: Cardiovascular;  Laterality: N/A;  . Cystoscopy with retrograde pyelogram, ureteroscopy and stent placement Right 12/31/2014    Procedure: CYSTOSCOPY  RIGHT URETEROSCOPY WITH STONE RETREIVAL RIGHT RETROGRADE PYELOGRAM;  Surgeon: Malen Gauze, MD;  Location: [facility] ORS;  Service: Urology;  Laterality: Right;   Family History  Problem Relation  Age of Onset  . Heart disease Father   . Hyperlipidemia Father   . Heart disease Maternal Grandmother    Social History  Substance Use Topics  . Smoking status: Current Every Day Smoker -- 1.00 packs/day for 31 years    Types: Cigarettes  . Smokeless tobacco: Former Neurosurgeon    Quit date: 04/27/2013  . Alcohol Use: Yes     Comment: last drink 03/07/2015     Review of Systems  Constitutional: Negative for fever and chills.  HENT: Negative for congestion, drooling and ear pain.   Eyes: Negative for pain and redness.  Respiratory: Negative for cough and shortness of breath.   Cardiovascular: Negative for chest pain.  Gastrointestinal: Negative for nausea, abdominal pain and diarrhea.  Endocrine: Negative for polydipsia and polyuria.  Musculoskeletal: Negative for myalgias and back pain.  Psychiatric/Behavioral: Positive for suicidal ideas and dysphoric mood. Negative for self-injury.  All other systems reviewed and are negative.     Allergies  Review of patient's allergies indicates no known allergies.  Home Medications   Prior to Admission medications   Medication Sig Start Date End Date Taking? Authorizing Provider  aspirin 325 MG EC tablet Take 325 mg by mouth daily.   Yes Historical Provider, MD  FLUoxetine (PROZAC) 10 MG capsule Take 1 capsule (10 mg total) by mouth daily. 03/11/15  Yes Oneta Rack, NP  metoprolol (LOPRESSOR) 50 MG tablet Take 50 mg by mouth 2 (two) times daily.    Yes Historical Provider, MD  traZODone (DESYREL) 100 MG tablet Take 1 tablet (100 mg total) by  mouth at bedtime as needed for sleep. 02/11/15  Yes Jimmy FootmanAndrea Hernandez-Gonzalez, MD  mometasone-formoterol (DULERA) 100-5 MCG/ACT AERO Inhale 2 puffs into the lungs 2 (two) times daily. Patient not taking: Reported on 04/01/2015 02/11/15   Jimmy FootmanAndrea Hernandez-Gonzalez, MD   BP 115/81 mmHg  Pulse 100  Temp(Src) 98.1 F (36.7 C) (Oral)  Resp 16  SpO2 92% Physical Exam  Constitutional: He appears  well-developed and well-nourished.  HENT:  Head: Normocephalic and atraumatic.  Neck: Normal range of motion.  Cardiovascular: Normal rate.   Pulmonary/Chest: Effort normal. No respiratory distress.  Abdominal: He exhibits no distension.  Musculoskeletal: Normal range of motion.  Neurological: He is alert.  Psychiatric: His affect is blunt. His speech is delayed. His speech is not rapid and/or pressured. He exhibits a depressed mood.  Nursing note and vitals reviewed.   ED Course  Procedures (including critical care time) Labs Review Labs Reviewed  COMPREHENSIVE METABOLIC PANEL - Abnormal; Notable for the following:    Glucose, Bld 103 (*)    All other components within normal limits  ETHANOL - Abnormal; Notable for the following:    Alcohol, Ethyl (B) 102 (*)    All other components within normal limits  URINE RAPID DRUG SCREEN, HOSP PERFORMED - Abnormal; Notable for the following:    Cocaine POSITIVE (*)    All other components within normal limits  ACETAMINOPHEN LEVEL - Abnormal; Notable for the following:    Acetaminophen (Tylenol), Serum <10 (*)    All other components within normal limits  CBC  SALICYLATE LEVEL    Imaging Review No results found. I have personally reviewed and evaluated these images and lab results as part of my medical decision-making.   EKG Interpretation   Date/Time:  Thursday April 01 2015 13:52:07 EST Ventricular Rate:  95 PR Interval:  168 QRS Duration: 94 QT Interval:  352 QTC Calculation: 442 R Axis:   16 Text Interpretation:  Normal sinus rhythm Possible Left atrial enlargement  Possible Inferior infarct , age undetermined Abnormal ECG No significant  change since last tracing Confirmed by [facility] MD, Barbara CowerJASON [phone]([id number]) on  04/01/2015 2:14:09 PM      MDM   Final diagnoses:  Suicidal behavior   Suicidal with plan to overdose on heroin. History of suicide attempts in the past. Worsening depression as well not taking medications.  Will eval with labs after medical clearance will consult TTS for further evaluation and management.     Marily MemosJason Twania Bujak, MD 04/01/15 [phone]

## 2015-04-01 NOTE — ED Notes (Signed)
Two patients belongings bags placed in locker 32.

## 2015-04-01 NOTE — ED Notes (Signed)
Per pt, states chronic SI-states he has not been taking meds for 3 weeks-states he sees therapist once a week
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
## 2015-04-02 ENCOUNTER — Encounter (HOSPITAL_COMMUNITY): Payer: Self-pay | Admitting: *Deleted

## 2015-04-02 ENCOUNTER — Inpatient Hospital Stay (HOSPITAL_COMMUNITY)
Admission: EM | Admit: 2015-04-02 | Discharge: 2015-04-13 | DRG: 885 | Disposition: A | Discharge status: Home / Self Care | Payer: Federal, State, Local not specified - Other | Source: Intra-hospital | Attending: Psychiatry | Admitting: Psychiatry

## 2015-04-02 DIAGNOSIS — E7800 Pure hypercholesterolemia, unspecified: Secondary | ICD-10-CM | POA: Diagnosis present

## 2015-04-02 DIAGNOSIS — E119 Type 2 diabetes mellitus without complications: Secondary | ICD-10-CM | POA: Diagnosis present

## 2015-04-02 DIAGNOSIS — E781 Pure hyperglyceridemia: Secondary | ICD-10-CM | POA: Diagnosis present

## 2015-04-02 DIAGNOSIS — F1020 Alcohol dependence, uncomplicated: Secondary | ICD-10-CM | POA: Diagnosis present

## 2015-04-02 DIAGNOSIS — I2510 Atherosclerotic heart disease of native coronary artery without angina pectoris: Secondary | ICD-10-CM | POA: Diagnosis present

## 2015-04-02 DIAGNOSIS — G4700 Insomnia, unspecified: Secondary | ICD-10-CM | POA: Diagnosis present

## 2015-04-02 DIAGNOSIS — R45851 Suicidal ideations: Secondary | ICD-10-CM | POA: Diagnosis present

## 2015-04-02 DIAGNOSIS — F319 Bipolar disorder, unspecified: Principal | ICD-10-CM | POA: Diagnosis present

## 2015-04-02 DIAGNOSIS — Z590 Homelessness: Secondary | ICD-10-CM

## 2015-04-02 DIAGNOSIS — Z8249 Family history of ischemic heart disease and other diseases of the circulatory system: Secondary | ICD-10-CM

## 2015-04-02 DIAGNOSIS — F17210 Nicotine dependence, cigarettes, uncomplicated: Secondary | ICD-10-CM | POA: Diagnosis present

## 2015-04-02 DIAGNOSIS — F1410 Cocaine abuse, uncomplicated: Secondary | ICD-10-CM

## 2015-04-02 DIAGNOSIS — I10 Essential (primary) hypertension: Secondary | ICD-10-CM | POA: Diagnosis present

## 2015-04-02 DIAGNOSIS — G4710 Hypersomnia, unspecified: Secondary | ICD-10-CM | POA: Diagnosis present

## 2015-04-02 DIAGNOSIS — F410 Panic disorder [episodic paroxysmal anxiety] without agoraphobia: Secondary | ICD-10-CM | POA: Diagnosis present

## 2015-04-02 DIAGNOSIS — J449 Chronic obstructive pulmonary disease, unspecified: Secondary | ICD-10-CM | POA: Diagnosis present

## 2015-04-02 DIAGNOSIS — F3130 Bipolar disorder, current episode depressed, mild or moderate severity, unspecified: Secondary | ICD-10-CM | POA: Diagnosis not present

## 2015-04-02 DIAGNOSIS — Z951 Presence of aortocoronary bypass graft: Secondary | ICD-10-CM

## 2015-04-02 DIAGNOSIS — F1420 Cocaine dependence, uncomplicated: Secondary | ICD-10-CM | POA: Insufficient documentation

## 2015-04-02 MED ORDER — LORAZEPAM 1 MG PO TABS
1.0000 mg | ORAL_TABLET | Freq: Four times a day (QID) | ORAL | Status: DC | PRN
Start: 2015-04-02 — End: 2015-04-04
  Administered 2015-04-02 – 2015-04-04 (×6): 1 mg via ORAL
  Filled 2015-04-02 (×6): qty 1

## 2015-04-02 MED ORDER — IBUPROFEN 600 MG PO TABS
600.0000 mg | ORAL_TABLET | Freq: Three times a day (TID) | ORAL | Status: DC | PRN
  Administered 2015-04-05 – 2015-04-10 (×7): 600 mg via ORAL
  Filled 2015-04-02 (×7): qty 1

## 2015-04-02 MED ORDER — FLUOXETINE HCL 20 MG PO CAPS
20.0000 mg | ORAL_CAPSULE | Freq: Every day | ORAL | Status: DC
  Administered 2015-04-03 – 2015-04-13 (×11): 20 mg via ORAL
  Filled 2015-04-02 (×6): qty 1
  Filled 2015-04-02: qty 2
  Filled 2015-04-02: qty 1
  Filled 2015-04-02: qty 7
  Filled 2015-04-02 (×5): qty 1

## 2015-04-02 MED ORDER — NICOTINE 21 MG/24HR TD PT24
21.0000 mg | MEDICATED_PATCH | Freq: Every day | TRANSDERMAL | Status: DC | PRN
  Administered 2015-04-03 – 2015-04-12 (×7): 21 mg via TRANSDERMAL
  Filled 2015-04-02 (×9): qty 1

## 2015-04-02 MED ORDER — ACETAMINOPHEN 325 MG PO TABS: 650.0000 mg | ORAL_TABLET | Freq: Four times a day (QID) | ORAL | Status: DC | PRN

## 2015-04-02 MED ORDER — TRAZODONE HCL 50 MG PO TABS
50.0000 mg | ORAL_TABLET | Freq: Every evening | ORAL | Status: DC | PRN
  Administered 2015-04-02 – 2015-04-04 (×3): 50 mg via ORAL
  Filled 2015-04-02 (×3): qty 1

## 2015-04-02 MED ORDER — FLUOXETINE HCL 20 MG PO CAPS: 20.0000 mg | ORAL_CAPSULE | Freq: Every day | ORAL | Status: DC

## 2015-04-02 MED ORDER — MAGNESIUM HYDROXIDE 400 MG/5ML PO SUSP: 30.0000 mL | Freq: Every day | ORAL | Status: DC | PRN

## 2015-04-02 MED ORDER — ALUM & MAG HYDROXIDE-SIMETH 200-200-20 MG/5ML PO SUSP
30.0000 mL | ORAL | Status: DC | PRN
Start: 2015-04-02 — End: 2015-04-07
  Administered 2015-04-05 – 2015-04-07 (×5): 30 mL via ORAL
  Filled 2015-04-02 (×5): qty 30

## 2015-04-02 MED ORDER — ONDANSETRON HCL 4 MG PO TABS: 4.0000 mg | ORAL_TABLET | Freq: Three times a day (TID) | ORAL | Status: DC | PRN

## 2015-04-02 MED ORDER — ACETAMINOPHEN 325 MG PO TABS: 650.0000 mg | ORAL_TABLET | ORAL | Status: DC | PRN

## 2015-04-02 MED ORDER — QUETIAPINE FUMARATE 100 MG PO TABS
100.0000 mg | ORAL_TABLET | Freq: Every day | ORAL | Status: DC
  Administered 2015-04-02 – 2015-04-04 (×4): 100 mg via ORAL
  Filled 2015-04-02 (×2): qty 1
  Filled 2015-04-02: qty 2
  Filled 2015-04-02 (×2): qty 1

## 2015-04-02 MED ORDER — ALUM & MAG HYDROXIDE-SIMETH 200-200-20 MG/5ML PO SUSP: 30.0000 mL | ORAL | Status: DC | PRN

## 2015-04-02 MED ORDER — QUETIAPINE FUMARATE 100 MG PO TABS
ORAL_TABLET | ORAL | Status: AC
  Administered 2015-04-03: 100 mg via ORAL
  Filled 2015-04-02: qty 1

## 2015-04-02 NOTE — BH Assessment (Signed)
[facility] Assessment Progress Note  Per Steven MinsMojeed Akintayo, MD, this pt [facility].  Steven Heinrichina Tate, RN, [facility] has [facility] pt to Obs 4.  Pt has signed Voluntary Admission and Consent for Treatment, as well as Consent to Release Information to [facility] his outpatient provider, and a notification call has been placed.  [facility]  Pt's nurse, Steven [name], has been notified, and agrees to send original paperwork along with pt via Steven [place], and to call report to [phone] or [phone].  Steven Canninghomas Markala Sitts, MA Triage Specialist [phone]

## 2015-04-02 NOTE — Plan of Care (Signed)
[facility] Observation Crisis Plan  Reason for Crisis Plan:  Chronic Mental Illness/Medical Illness   Plan of Care:  Referral for Inpatient Hospitalization  Family Support:      Current Living Environment:  Living Arrangements: Other relatives  Insurance:   Hospital Account    Name Acct ID Class Status Primary Coverage   Steven [name], Steven [name] [id number] BEHAVIORAL HEALTH OBSERVATION [facility] [place] [address]        Guarantor Account (for Hospital Account [id number])    Name Relation to Pt Service Area Active? Acct Type   Steven [name], Steven [name] Self [facility] Yes [facility]   Address Phone       [address] [name], [place] [address] [phone])          Coverage Information (for Hospital Account [id number])    F/O Payor/Plan Precert #   [facility] [address]    Subscriber Subscriber #   Steven [name], Steven [name] [id number]   Address Phone   [address] [place], [place] [address] [phone]      Legal Guardian:     Primary Care Provider:  [name],[name], Steven [name]  Current Outpatient Providers:  unknown  Psychiatrist:     Counselor/Therapist:     Compliant with Medications:  Yes  Additional Information:   Steven [name], Steven [name] 11/18/20164:14 PM

## 2015-04-02 NOTE — Progress Notes (Signed)
[facility] INPATIENT:  Family/Significant Other Suicide Prevention Education  Suicide Prevention Education:  Patient Refusal for Family/Significant Other Suicide Prevention Education: The patient Steven [name] has refused to provide written consent for family/significant other to be provided Family/Significant Other Suicide Prevention Education during admission and/or prior to discharge.  Physician notified.  Loren RacerMaggio, Jocee Kissick J 04/02/2015, 3:43 PM

## 2015-04-02 NOTE — Progress Notes (Signed)
Pt in bed observed awake. Watching TV at start of shift. Flat affect. Appears depressed but cooperative. Denies pain, HI, AVH at this time. Requesting snacks and drinks.  pt also wanted extra dinner here. Wants his "sleep" meds. Pt offered support and encouragement.  Pt given soft drink, snacks, extra dinner and due Prn med given for sleep sleep.  Q 15 minute check for safety.   Continue to monitor pt for safety and stability. Pt receptive to nursing intervention. Sleeping.

## 2015-04-02 NOTE — Progress Notes (Signed)
Patient admitted to observation after expressing thoughts of suicide with plan to OD on Heroin. Patient stated that he had been clean and sober for 18 months and relapsed Monday on 6 beers and 2 lines of cocaine.Patient has flat depressed affect. Speech is slow. Patient slow to respond to questions. Patient stated that he is suicidal currently but "I can't do nothing in here." Contracts for safety. Oriented to unit.

## 2015-04-02 NOTE — ED Notes (Addendum)
Pt is resting comfortably . Respirations are regular and non -labored.pt requested 1mg  of ativan po for his nerves. Pt was given this at 9am. Report called to observation, Report given to Nadean CorwinKim Maggio. Pt to go to room 4 in observation.Phoned Celso AmyJamie Lord to write orders on the pt. (1:15pm) [facility] was phoned as well. (1:20pm)2;20pm _pPt given all his belongings and will be transported via [facility] across the street.

## 2015-04-02 NOTE — Consult Note (Signed)
[place] Psychiatry Consult   Reason for Consult:  Suicidal ideations Referring Physician:  EDP Patient Identification: Steven [name] MRN:  [id number] Principal Diagnosis: Bipolar I disorder, most recent episode depressed [facility]) Diagnosis:   Patient Active Problem List   Diagnosis Date Noted  . Alcohol use disorder, moderate, dependence ([place]) [F10.20] 04/02/2015    Priority: High  . Bipolar I disorder, most recent episode depressed ([place]) [F31.30] 03/09/2015    Priority: High  . Alcohol abuse [F10.10] 03/08/2015    Priority: High  . Cocaine abuse [F14.10] 03/08/2015    Priority: High  . Substance induced mood disorder ([place]) [F19.94] 03/08/2015  . Suicidal ideation [R45.851]   . MDD (major depressive disorder), recurrent episode, moderate ([place]) [F33.1] 02/11/2015  . COPD (chronic obstructive pulmonary disease) ([place]) [J44.9] 02/09/2015  . Tobacco use disorder [F17.200] 02/09/2015  . Stimulant use disorder (cocaine) [F15.90] 02/09/2015  . Alcohol use disorder, severe, in sustained remission ([place]) [F10.21] 02/09/2015  . Ureteral stone [N20.1] 12/31/2014  . Claudication of left lower extremity ([place]) [I73.9] 08/14/2012  . CAD s/p CABG 2012 [facility] [I25.810] 11/28/2011  . Hyperlipidemia LDL goal <100 [E78.5] 03/13/2007  . Essential hypertension [I10] 03/13/2007  . GERD [K21.9] 03/13/2007    Total Time spent with patient: 45 minutes  Subjective:   [name] is a 44 y.o. male patient admitted to [facility] Obs for substance abuse and depression.  HPI:  44 yo male who presented to the ED after abusing alcohol and cocaine with suicidal ideations, history of bipolar disorder.  He was recently discharged from [facility] and "ran out of his medications".  [place] started using cocaine and alcohol with an increase in his depression.  Today, he is not suicidal but would like help for his depression and substance abuse.  Past Psychiatric History:  Cocaine and alcohol abuse, bipolar  disorder  Risk to Self: Suicidal Ideation: No Suicidal Intent: No Is patient at risk for suicide?: No Suicidal Plan?: No Specify Current Suicidal Plan:  (suicidal plans ) Access to Means: No Specify Access to Suicidal Means:  (n/a) What has been your use of drugs/alcohol within the last 12 months?:  (patient report relapsing yesterday on alcohol and cocaine ) How many times?:  ("1x a couple of years ago") Other Self Harm Risks:  (none reported) Triggers for Past Attempts: Other (Comment) (patient sts that he is unable to ) Intentional Self Injurious Behavior: None Risk to Others: Homicidal Ideation: No Thoughts of Harm to Others: No Current Homicidal Intent: No Current Homicidal Plan: No Access to Homicidal Means: No Identified Victim:  (n/a) History of harm to others?: No Assessment of Violence: None Noted Violent Behavior Description:  (patient is calm and cooperative ) Does patient have access to weapons?: No Criminal Charges Pending?: No Does patient have a court date: No Prior Inpatient Therapy: Prior Inpatient Therapy: Yes Prior Therapy Dates: 2010, 2016 Prior Therapy Facilty/Provider(s): [place], [place]  Reason for Treatment: SA and SI Prior Outpatient Therapy: Prior Outpatient Therapy: Yes Prior Therapy Dates: Current Prior Therapy Facilty/Provider(s): [facility]  Reason for Treatment: Therapy/Med management  Does patient have an ACCT team?: No Does patient have Intensive In-House Services?  : No Does patient have [facility] services? : No Does patient have P4CC services?: No  Past Medical History:  Past Medical History  Diagnosis Date  . Coronary artery disease   . Hypertension   . Hypercholesteremia   . Kidney stone   . Tobacco use disorder   .  Hx of CABG   . COPD (chronic obstructive pulmonary disease) ([place])   . Bipolar disorder (manic depression) ([place])   . Asthma     Past Surgical History  Procedure Laterality Date  . Coronary artery  bypass graft    . Kidney stone surgery    . Appendectomy    . Lower extremity angiogram N/A 08/15/2012    Procedure: LOWER EXTREMITY ANGIOGRAM with possible PTA /stent;  Surgeon: Jettie Booze, MD;  Location: [facility] CATH LAB;  Service: Cardiovascular;  Laterality: N/A;  . Abdominal angiogram  08/15/2012    Procedure: ABDOMINAL ANGIOGRAM;  Surgeon: Jettie Booze, MD;  Location: [facility] CATH LAB;  Service: Cardiovascular;;  . Cardioversion N/A 08/28/2012    Procedure: Pseudo Compression;  Surgeon: Angelia Mould, MD;  Location: [facility] CATH LAB;  Service: Cardiovascular;  Laterality: N/A;  . Cystoscopy with retrograde pyelogram, ureteroscopy and stent placement Right 12/31/2014    Procedure: CYSTOSCOPY  RIGHT URETEROSCOPY WITH STONE RETREIVAL RIGHT RETROGRADE PYELOGRAM;  Surgeon: Cleon Gustin, MD;  Location: [facility] ORS;  Service: Urology;  Laterality: Right;   Family History:  Family History  Problem Relation Age of Onset  . Heart disease Father   . Hyperlipidemia Father   . Heart disease Maternal Grandmother    Family Psychiatric  History: Substance abuse Social History:  History  Alcohol Use  . Yes    Comment: last drink 03/07/2015      History  Drug Use  . Yes  . Special: Cocaine    Comment: 10/23//2016    Social History   Social History  . Marital Status: Divorced    Spouse Name: N/A  . Number of Children: N/A  . Years of Education: N/A   Social History Main Topics  . Smoking status: Current Every Day Smoker -- 1.00 packs/day for 31 years    Types: Cigarettes  . Smokeless tobacco: Former Systems developer    Quit date: 04/27/2013  . Alcohol Use: Yes     Comment: last drink 03/07/2015   . Drug Use: Yes    Special: Cocaine     Comment: 10/23//2016  . Sexual Activity: Yes    Birth Control/ Protection: Condom   Other Topics Concern  . None   Social History Narrative   Additional Social History:    Pain Medications: SEE MAR Prescriptions: SEE dMAR Over the Counter: SEE  MAR History of alcohol / drug use?: Yes Name of Substance 1: Alcohol  1 - Age of First Use: 44 yrs old  1 - Amount (size/oz): 6 pack of beer 1 - Frequency: 1x use in 18 months (relapse) 1 - Duration: 1x use in 18 months 1 - Last Use / Amount: yesterday Name of Substance 2: Cocaine  2 - Age of First Use: 44 yrs old  2 - Amount (size/oz): "couple of lines" 2 - Frequency: 1x use in 18 months  2 - Duration: 1x use in the past 18 months  2 - Last Use / Amount: yesterday                 Allergies:  No Known Allergies  Labs:  Results for orders placed or performed during the hospital encounter of 04/01/15 (from the past 48 hour(s))  Comprehensive metabolic panel     Status: Abnormal   Collection Time: 04/01/15  8:51 AM  Result Value Ref Range   Sodium 140 135 - 145 mmol/L   Potassium 3.8 3.5 - 5.1 mmol/L   Chloride 104 101 -  111 mmol/L   CO2 23 22 - 32 mmol/L   Glucose, Bld 103 (H) 65 - 99 mg/dL   BUN 15 6 - 20 mg/dL   Creatinine, Ser 1.03 0.61 - 1.24 mg/dL   Calcium 9.6 8.9 - 10.3 mg/dL   Total Protein 7.9 6.5 - 8.1 g/dL   Albumin 4.4 3.5 - 5.0 g/dL   AST 28 15 - 41 U/L   ALT 40 17 - 63 U/L   Alkaline Phosphatase 75 38 - 126 U/L   Total Bilirubin 0.7 0.3 - 1.2 mg/dL   GFR calc non Af Amer >60 >60 mL/min   GFR calc Af Amer >60 >60 mL/min    Comment: (NOTE) The eGFR has been calculated using the CKD EPI equation. This calculation has not been validated in all clinical situations. eGFR's persistently <60 mL/min signify possible Chronic Kidney Disease.    Anion gap 13 5 - 15  Ethanol (ETOH)     Status: Abnormal   Collection Time: 04/01/15  8:51 AM  Result Value Ref Range   Alcohol, Ethyl (B) 102 (H) <5 mg/dL    Comment:        LOWEST DETECTABLE LIMIT FOR SERUM ALCOHOL IS 5 mg/dL FOR MEDICAL PURPOSES ONLY   CBC     Status: None   Collection Time: 04/01/15  8:51 AM  Result Value Ref Range   WBC 7.2 4.0 - 10.5 K/uL   RBC 4.80 4.22 - 5.81 MIL/uL   Hemoglobin 15.9  13.0 - 17.0 g/dL   HCT 45.9 39.0 - 52.0 %   MCV 95.6 78.0 - 100.0 fL   MCH 33.1 26.0 - 34.0 pg   MCHC 34.6 30.0 - 36.0 g/dL   RDW 12.9 11.5 - 15.5 %   Platelets 301 150 - 400 K/uL  Acetaminophen level     Status: Abnormal   Collection Time: 04/01/15  8:51 AM  Result Value Ref Range   Acetaminophen (Tylenol), Serum <10 (L) 10 - 30 ug/mL    Comment:        THERAPEUTIC CONCENTRATIONS VARY SIGNIFICANTLY. A RANGE OF 10-30 ug/mL MAY BE AN EFFECTIVE CONCENTRATION FOR MANY PATIENTS. HOWEVER, SOME ARE BEST TREATED AT CONCENTRATIONS OUTSIDE THIS RANGE. ACETAMINOPHEN CONCENTRATIONS >150 ug/mL AT 4 HOURS AFTER INGESTION AND >50 ug/mL AT 12 HOURS AFTER INGESTION ARE OFTEN ASSOCIATED WITH TOXIC REACTIONS.   Salicylate level     Status: None   Collection Time: 04/01/15  8:51 AM  Result Value Ref Range   Salicylate Lvl <6.7 2.8 - 30.0 mg/dL  Urine rapid drug screen (hosp performed) (Not at [facility])     Status: Abnormal   Collection Time: 04/01/15  1:33 PM  Result Value Ref Range   Opiates NONE DETECTED NONE DETECTED   Cocaine POSITIVE (A) NONE DETECTED   Benzodiazepines NONE DETECTED NONE DETECTED   Amphetamines NONE DETECTED NONE DETECTED   Tetrahydrocannabinol NONE DETECTED NONE DETECTED   Barbiturates NONE DETECTED NONE DETECTED    Comment:        DRUG SCREEN FOR MEDICAL PURPOSES ONLY.  IF CONFIRMATION IS NEEDED FOR ANY PURPOSE, NOTIFY LAB WITHIN 5 DAYS.        LOWEST DETECTABLE LIMITS FOR URINE DRUG SCREEN Drug Class       Cutoff (ng/mL) Amphetamine      1000 Barbiturate      200 Benzodiazepine   591 Tricyclics       638 Opiates          300 Cocaine  300 THC              50     Current Facility-Administered Medications  Medication Dose Route Frequency Provider Last Rate Last Dose  . alum & mag hydroxide-simeth (MAALOX/MYLANTA) 200-200-20 MG/5ML suspension 30 mL  30 mL Oral PRN Merrily Pew, MD      . aspirin EC tablet 325 mg  325 mg Oral Daily Merrily Pew, MD    325 mg at 04/02/15 0859  . [START ON 04/03/2015] FLUoxetine (PROZAC) capsule 20 mg  20 mg Oral Daily Madeeha Costantino      . ibuprofen (ADVIL,MOTRIN) tablet 600 mg  600 mg Oral Q8H PRN Merrily Pew, MD   600 mg at 04/02/15 4765  . LORazepam (ATIVAN) tablet 1 mg  1 mg Oral Q8H PRN Merrily Pew, MD      . metoprolol tartrate (LOPRESSOR) tablet 50 mg  50 mg Oral BID Merrily Pew, MD   50 mg at 04/02/15 0923  . nicotine (NICODERM CQ - dosed in mg/24 hours) patch 21 mg  21 mg Transdermal Daily Merrily Pew, MD   21 mg at 04/01/15 1506  . ondansetron (ZOFRAN) tablet 4 mg  4 mg Oral Q8H PRN Merrily Pew, MD      . traZODone (DESYREL) tablet 100 mg  100 mg Oral QHS PRN Merrily Pew, MD       Current Outpatient Prescriptions  Medication Sig Dispense Refill  . aspirin 325 MG EC tablet Take 325 mg by mouth daily.    Marland Kitchen FLUoxetine (PROZAC) 10 MG capsule Take 1 capsule (10 mg total) by mouth daily. 30 capsule 3  . metoprolol (LOPRESSOR) 50 MG tablet Take 50 mg by mouth 2 (two) times daily.     . traZODone (DESYREL) 100 MG tablet Take 1 tablet (100 mg total) by mouth at bedtime as needed for sleep. 7 tablet 0  . mometasone-formoterol (DULERA) 100-5 MCG/ACT AERO Inhale 2 puffs into the lungs 2 (two) times daily. (Patient not taking: Reported on 04/01/2015) 1 Inhaler 0    Musculoskeletal: Strength & Muscle Tone: within normal limits Gait & Station: normal Patient leans: N/A  Psychiatric Specialty Exam: Review of Systems  Constitutional: Negative.   HENT: Negative.   Eyes: Negative.   Respiratory: Negative.   Cardiovascular: Negative.   Gastrointestinal: Negative.   Genitourinary: Negative.   Musculoskeletal: Negative.   Skin: Negative.   Neurological: Negative.   Endo/Heme/Allergies: Negative.   Psychiatric/Behavioral: Positive for depression and substance abuse.    Blood pressure 135/74, pulse 63, temperature 97.9 F (36.6 C), temperature source Oral, resp. rate 18, SpO2 92 %.There is no weight  on file to calculate BMI.  General Appearance: Casual  Eye Contact::  Fair  Speech:  Normal Rate  Volume:  Normal  Mood:  Depressed  Affect:  Congruent  Thought Process:  Coherent  Orientation:  Full (Time, Place, and Person)  Thought Content:  Rumination  Suicidal Thoughts:  No  Homicidal Thoughts:  No  Memory:  Immediate;   Fair Recent;   Fair Remote;   Fair  Judgement:  Fair  Insight:  Fair  Psychomotor Activity:  Decreased  Concentration:  Fair  Recall:  [employer] of Knowledge:Fair  Language: Fair  Akathisia:  No  Handed:  Right  AIMS (if indicated):     Assets:  Housing Leisure Time Physical Health Resilience Social Support  ADL's:  Intact  Cognition: WNL  Sleep:      Treatment Plan Summary: Daily contact with patient  to assess and evaluate symptoms and progress in treatment, Medication management and Plan bipolar disorder, depressed, moderate: -Crisis stabilization -Medication management:  Prozac increased to 20 mg daily for depression, Ativan 1 mg PRN withdrawal symptoms -Individual and substance abuse counseling  Disposition: Supportive therapy provided about ongoing stressors.  Waylan Boga, [place] 04/02/2015 1:12 PM Patient seen face-to-face for psychiatric evaluation, chart reviewed and case discussed with the physician extender and developed treatment plan. Reviewed the information documented and agree with the treatment plan. Corena Pilgrim, MD

## 2015-04-02 NOTE — Plan of Care (Signed)
[facility] Observation Crisis Plan  Reason for Crisis Plan:  Chronic Mental Illness/Medical Illness and Crisis Stabilization   Plan of Care:  Referral for Inpatient Hospitalization  Family Support:      Current Living Environment:  Living Arrangements: Other relatives  Insurance:   Hospital Account    Name Acct ID Class Status Primary Coverage   Steven [name], Steven [name] [id number] BEHAVIORAL HEALTH OBSERVATION [facility] [place] [address]        Guarantor Account (for Hospital Account [id number])    Name Relation to Pt Service Area Active? Acct Type   Steven [name], Steven [name] Self [facility] Yes [facility]   Address Phone       [address] [name], [place] [address] [phone])          Coverage Information (for Hospital Account [id number])    F/O Payor/Plan Precert #   [facility] [address]    Subscriber Subscriber #   Steven [name], Steven [name] [id number]   Address Phone   [address] [place], [place] [address] [phone]      Legal Guardian:     Primary Care Provider:  [name],Steven [name], Steven [name]  Current Outpatient Providers:  unknown  Psychiatrist:     Counselor/Therapist:     Compliant with Medications:  Yes  Additional Information:   Steven [name], Steven [name] 11/18/20163:43 PM

## 2015-04-03 DIAGNOSIS — F319 Bipolar disorder, unspecified: Secondary | ICD-10-CM | POA: Diagnosis not present

## 2015-04-03 DIAGNOSIS — R45851 Suicidal ideations: Secondary | ICD-10-CM | POA: Diagnosis not present

## 2015-04-03 DIAGNOSIS — F3130 Bipolar disorder, current episode depressed, mild or moderate severity, unspecified: Secondary | ICD-10-CM | POA: Diagnosis not present

## 2015-04-03 MED ORDER — HYDROXYZINE HCL 25 MG PO TABS
25.0000 mg | ORAL_TABLET | Freq: Four times a day (QID) | ORAL | Status: DC | PRN
  Administered 2015-04-03 – 2015-04-10 (×14): 25 mg via ORAL
  Filled 2015-04-03 (×15): qty 1

## 2015-04-03 MED ORDER — LITHIUM CARBONATE 300 MG PO CAPS
300.0000 mg | ORAL_CAPSULE | Freq: Two times a day (BID) | ORAL | Status: DC
  Administered 2015-04-03 – 2015-04-07 (×8): 300 mg via ORAL
  Filled 2015-04-03 (×13): qty 1

## 2015-04-03 NOTE — BH Assessment (Signed)
[facility] Assessment Progress Note Patient was seen this date by Earlene Plateravis NP and Alonie Gazzola LCAS to evaluate for SI and continued stay criteria. Patient stated they were still having thoughts of self harm with a plan to either overdose on drugs or walk out in front of traffic if released. Patient rated his depression at a 9 this date and feels the [facility]. Patient will continue to be monitored in the observational unit and re-evaluated in the a.m. Patient is also homeless at this time and [facility] to explore [facility] options.

## 2015-04-03 NOTE — [facility] Counselor (Signed)
Pt continues to have a depressed affect and affect, but is cooperative. [facility] Pt currently denies SI/HI, and attends AA meetings and currently has a sponsor. Pt needs to follow up with his mental health providers upon d/c from the observation unit. Pt will be evaluated in the AM to determine possible discharge.  Ardelle ParkLatoya McNeil, MA OBS Counselor

## 2015-04-03 NOTE — [facility] Counselor (Signed)
Pt  continues to have SI with no particular plan in mind, but states that there are a list of ways that he has come up with to commit suicide. [facility] due to his current medications  of Latuda, Prozac, and Depakote not being effective with deterring his racing thoughts.  Pt shared that he is agitated and that he is not worried about obtaining housing because once he is discharged he is going to kill himself. Pt verbalized that he just wants to get some help and that he prays to "God" daily to assist him with decreasing his suicidal thoughts and with drinking. Pt would benefit from inpatient hospitalization on the adult unit in an overall attempt to get his medications adjusted. Counselor [facility] and night extender to further determine disposition. Counselor also provided Pt with a [place] in [place].   Ardelle ParkLatoya McNeil, MA OBS Counselor
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
## 2015-04-03 NOTE — Progress Notes (Signed)
[facility]  Progress Note  04/03/2015 11:12 AM Steven [name]  MRN:  [id number] Subjective:    Patient states "I'm just suicidal. I have never done heroine but I know how to get it. My medications are not working. I don't like my life at all. I don't want to live another minute of it. I cut my wrist several years ago so deep the blood was spurting out of it. I need some kind of medication adjustment. I was depressed even before I became homeless a few years ago."  Objective:   Patient is seen and chart is reviewed. Steven [name] is reporting severe depressive symptoms that include anhedonia, depressed mood, low energy and suicidal thoughts with a plan. He was recently discharged from [facility] on 03/08/2015 on 10 mg of Prozac for depressive symptoms. Steven [name] was discharged from [facility] on 02/09/2015 where it is documented from notes that the patient also expressed suicidal plan to overdose on heroine due to numerous stressors such as financial problems and issues with homelessness. At that time the patient was refusing to go to a homeless shelter. Six months ago he was started on Steven [name] but reports "That made me stuck with my suicidal thoughts."Per review of past records the patient has not been tried on Lithium which could help his depressive symptoms and treat his chronic suicidal ideation. The patient denies any recent suicide attempts with the last of cutting his wrist being a few years ago. Review of past lab-work does not indicate that the patient has any history of thyroid disease or renal impairment.   Principal Problem: Bipolar 1 disorder, depressed (HCC) Diagnosis:   Patient Active Problem List   Diagnosis Date Noted  . Alcohol use disorder, moderate, dependence (HCC) [F10.20] 04/02/2015  . Bipolar 1 disorder, depressed (HCC) [F31.9] 04/02/2015  . Bipolar I disorder, most recent episode depressed (HCC) [F31.30] 03/09/2015  . Alcohol abuse [F10.10] 03/08/2015  . Cocaine abuse [F14.10] 03/08/2015   . Substance induced mood disorder (HCC) [F19.94] 03/08/2015  . Suicidal ideation [R45.851]   . MDD (major depressive disorder), recurrent episode, moderate (HCC) [F33.1] 02/11/2015  . COPD (chronic obstructive pulmonary disease) (HCC) [J44.9] 02/09/2015  . Tobacco use disorder [F17.200] 02/09/2015  . Stimulant use disorder (cocaine) [F15.90] 02/09/2015  . Alcohol use disorder, severe, in sustained remission (HCC) [F10.21] 02/09/2015  . Ureteral stone [N20.1] 12/31/2014  . 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 CAD s/p CABG 2012 [facility] [I25.810] 11/28/2011  . Hyperlipidemia LDL goal <100 [E78.5] 03/13/2007  . Essential hypertension [I10] 03/13/2007  . GERD [K21.9] 03/13/2007   Total Time spent with patient: 30 minutes  Past Medical History:  Past Medical History  Diagnosis Date  . Coronary artery disease   . Hypertension   . Hypercholesteremia   . Kidney stone   . Tobacco use disorder   . Hx of CABG   . COPD (chronic obstructive pulmonary disease) (HCC)   . Bipolar disorder (manic depression) (HCC)   . Asthma     Past Surgical History  Procedure Laterality Date  . Coronary artery bypass graft    . Kidney stone surgery    . Appendectomy    . Lower extremity angiogram N/A 08/15/2012    Procedure: LOWER EXTREMITY ANGIOGRAM with possible PTA /stent;  Surgeon: Corky Crafts, MD;  Location: [facility] CATH LAB;  Service: Cardiovascular;  Laterality: N/A;  . Abdominal angiogram  08/15/2012    Procedure: ABDOMINAL ANGIOGRAM;  Surgeon: Corky Crafts, MD;  Location:  [facility] CATH LAB;  Service: Cardiovascular;;  . Cardioversion N/A 08/28/2012    Procedure: Pseudo Compression;  Surgeon: Chuck Hint, MD;  Location: [facility] CATH LAB;  Service: Cardiovascular;  Laterality: N/A;  . Cystoscopy with retrograde pyelogram, ureteroscopy and stent placement Right 12/31/2014    Procedure: CYSTOSCOPY  RIGHT URETEROSCOPY WITH STONE RETREIVAL RIGHT RETROGRADE  PYELOGRAM;  Surgeon: Malen Gauze, MD;  Location: [facility] ORS;  Service: Urology;  Laterality: Right;   Family History:  Family History  Problem Relation Age of Onset  . Heart disease Father   . Hyperlipidemia Father   . Heart disease Maternal Grandmother    Family Psychiatric  History: Reports a cousin completed suicide Social History:  History  Alcohol Use  . Yes    Comment: last drink 03/07/2015      History  Drug Use  . Yes  . Special: Cocaine    Comment: 10/23//2016    Social History   Social History  . Marital Status: Divorced    Spouse Name: N/A  . Number of Children: N/A  . Years of Education: N/A   Social History Main Topics  . Smoking status: Current Every Day Smoker -- 1.00 packs/day for 31 years    Types: Cigarettes  . Smokeless tobacco: Former Neurosurgeon    Quit date: 04/27/2013  . Alcohol Use: Yes     Comment: last drink 03/07/2015   . Drug Use: Yes    Special: Cocaine     Comment: 10/23//2016  . Sexual Activity: Yes    Birth Control/ Protection: Condom   Other Topics Concern  . None   Social History Narrative   Additional Social History:    Pain Medications: SEE MAR Prescriptions: SEE dMAR Over the Counter: SEE MAR Longest period of sobriety (when/how long): 18 months Apr 14 2013 to Mar 07 2015 Negative Consequences of Use: Financial, Legal, Personal relationships Withdrawal Symptoms: Other (Comment) (none currently) Name of Substance 1: Alcohol  1 - Age of First Use: 44 yrs old  1 - Amount (size/oz): 6 pack of beer 1 - Frequency: 1x use in 18 months (relapse) 1 - Duration: 1x use in 18 months 1 - Last Use / Amount: yesterday Name of Substance 2: Cocaine  2 - Age of First Use: 44 yrs old  2 - Amount (size/oz): "couple of lines" 2 - Frequency: 1x use in 18 months  2 - Duration: 1x use in the past 18 months  2 - Last Use / Amount: yesterday                Sleep: Fair  Appetite:  Fair  Current Medications: Current  Facility-Administered Medications  Medication Dose Route Frequency Provider Last Rate Last Dose  . acetaminophen (TYLENOL) tablet 650 mg  650 mg Oral Q6H PRN Thermon Leyland, NP      . acetaminophen (TYLENOL) tablet 650 mg  650 mg Oral Q4H PRN Charm Rings, NP      . alum & mag hydroxide-simeth (MAALOX/MYLANTA) 200-200-20 MG/5ML suspension 30 mL  30 mL Oral Q4H PRN Thermon Leyland, NP      . alum & mag hydroxide-simeth (MAALOX/MYLANTA) 200-200-20 MG/5ML suspension 30 mL  30 mL Oral PRN Charm Rings, NP      . FLUoxetine (PROZAC) capsule 20 mg  20 mg Oral Daily Thermon Leyland, NP   20 mg at 04/03/15 0906  . ibuprofen (ADVIL,MOTRIN) tablet 600 mg  600 mg Oral Q8H PRN Charm Rings, NP      .  lithium carbonate capsule 300 mg  300 mg Oral BID WC Thermon LeylandLaura A Davis, NP      . LORazepam (ATIVAN) tablet 1 mg  1 mg Oral Q6H PRN Thermon LeylandLaura A Davis, NP   1 mg at 04/03/15 0908  . magnesium hydroxide (MILK OF MAGNESIA) suspension 30 mL  30 mL Oral Daily PRN Thermon LeylandLaura A Davis, NP      . nicotine (NICODERM CQ - dosed in mg/24 hours) patch 21 mg  21 mg Transdermal Daily PRN Charm RingsJamison Y Lord, NP   21 mg at 04/03/15 0907  . ondansetron (ZOFRAN) tablet 4 mg  4 mg Oral Q8H PRN Charm RingsJamison Y Lord, NP      . QUEtiapine (SEROQUEL) tablet 100 mg  100 mg Oral QHS Worthy FlankIjeoma E Nwaeze, NP   100 mg at 04/03/15 0026  . traZODone (DESYREL) tablet 50 mg  50 mg Oral QHS PRN Thermon LeylandLaura A Davis, NP   50 mg at 04/02/15 2109    Lab Results:  Results for orders placed or performed during the hospital encounter of 04/01/15 (from the past 48 hour(s))  Urine rapid drug screen (hosp performed) (Not at [facility])     Status: Abnormal   Collection Time: 04/01/15  1:33 PM  Result Value Ref Range   Opiates NONE DETECTED NONE DETECTED   Cocaine POSITIVE (A) NONE DETECTED   Benzodiazepines NONE DETECTED NONE DETECTED   Amphetamines NONE DETECTED NONE DETECTED   Tetrahydrocannabinol NONE DETECTED NONE DETECTED   Barbiturates NONE DETECTED NONE DETECTED    Comment:         DRUG SCREEN FOR MEDICAL PURPOSES ONLY.  IF CONFIRMATION IS NEEDED FOR ANY PURPOSE, NOTIFY LAB WITHIN 5 DAYS.        LOWEST DETECTABLE LIMITS FOR URINE DRUG SCREEN Drug Class       Cutoff (ng/mL) Amphetamine      1000 Barbiturate      200 Benzodiazepine   200 Tricyclics       300 Opiates          300 Cocaine          300 THC              50     Physical Findings: AIMS: Facial and Oral Movements Muscles of Facial Expression: None, normal Lips and Perioral Area: None, normal Jaw: None, normal Tongue: None, normal,Extremity Movements Upper (arms, wrists, hands, fingers): None, normal Lower (legs, knees, ankles, toes): None, normal, Trunk Movements Neck, shoulders, hips: None, normal, Overall Severity Severity of abnormal movements (highest score from questions above): None, normal Incapacitation due to abnormal movements: None, normal Patient's awareness of abnormal movements (rate only patient's report): No Awareness, Dental Status Current problems with teeth and/or dentures?: Yes (pt sts he needs new dentures.  "they are worn out") Does patient usually wear dentures?: No  CIWA:  CIWA-Ar Total: 1 COWS:  COWS Total Score: 1  Musculoskeletal: Strength & Muscle Tone: within normal limits Gait & Station: normal Patient leans: N/A  Psychiatric Specialty Exam: Review of Systems  Constitutional: Negative.   HENT: Negative.   Eyes: Negative.   Respiratory: Negative.   Cardiovascular: Negative.   Gastrointestinal: Negative.   Genitourinary: Negative.   Musculoskeletal: Negative.   Skin: Negative.   Neurological: Negative.   Endo/Heme/Allergies: Negative.   Psychiatric/Behavioral: Positive for depression, suicidal ideas and substance abuse. Negative for hallucinations and memory loss. The patient is nervous/anxious. The patient does not have insomnia.     Blood pressure 136/78, pulse 97, temperature  98.4 F (36.9 C), temperature source Oral, resp. rate 18, height 5'  7.5" (1.715 m), weight 94.802 kg (209 lb), SpO2 97 %.Body mass index is 32.23 kg/(m^2).  General Appearance: Disheveled  Eye Solicitor::  Fair  Speech:  Clear and Coherent  Volume:  Normal  Mood:  Dysphoric and Hopeless  Affect:  Flat  Thought Process:  Goal Directed and Linear  Orientation:  Full (Time, Place, and Person)  Thought Content:  Rumination  Suicidal Thoughts:  Yes.  with intent/plan  Homicidal Thoughts:  No  Memory:  Immediate;   Fair Recent;   Fair Remote;   Fair  Judgement:  Impaired  Insight:  Shallow  Psychomotor Activity:  Decreased  Concentration:  Fair  Recall:  Fair  Fund of Knowledge:Good  Language: Good  Akathisia:  No  Handed:  Right  AIMS (if indicated):     Assets:  Communication Skills Desire for Improvement Leisure Time Resilience  ADL's:  Intact  Cognition: WNL  Sleep:      Treatment Plan Summary: Daily contact with patient to assess and evaluate symptoms and progress in treatment and Medication management  -Continue Prozac 20 mg daily for depression -Continue Seroquel 100 mg hs for mood control/insomnia -Start Lithium 300 mg bid for depressive symptoms/chronic suicidal ideation -Continue to monitor in the [facility] to assess for continued active suicidal ideation -Will explore housing resources to assist with services prior to discharge as this is a primary stressors for the patient   Fransisca Kaufmann, NP-C 04/03/2015, 11:12 AM Agree with NP Progress Note, as above  Nehemiah Massed, MD

## 2015-04-04 DIAGNOSIS — I2510 Atherosclerotic heart disease of native coronary artery without angina pectoris: Secondary | ICD-10-CM | POA: Diagnosis present

## 2015-04-04 DIAGNOSIS — I10 Essential (primary) hypertension: Secondary | ICD-10-CM | POA: Diagnosis present

## 2015-04-04 DIAGNOSIS — F319 Bipolar disorder, unspecified: Secondary | ICD-10-CM | POA: Diagnosis not present

## 2015-04-04 DIAGNOSIS — E781 Pure hyperglyceridemia: Secondary | ICD-10-CM | POA: Diagnosis present

## 2015-04-04 DIAGNOSIS — E119 Type 2 diabetes mellitus without complications: Secondary | ICD-10-CM | POA: Diagnosis present

## 2015-04-04 DIAGNOSIS — R45851 Suicidal ideations: Secondary | ICD-10-CM | POA: Diagnosis not present

## 2015-04-04 DIAGNOSIS — F3130 Bipolar disorder, current episode depressed, mild or moderate severity, unspecified: Secondary | ICD-10-CM | POA: Diagnosis not present

## 2015-04-04 DIAGNOSIS — F1420 Cocaine dependence, uncomplicated: Secondary | ICD-10-CM | POA: Diagnosis not present

## 2015-04-04 DIAGNOSIS — Z951 Presence of aortocoronary bypass graft: Secondary | ICD-10-CM | POA: Diagnosis not present

## 2015-04-04 DIAGNOSIS — Z8249 Family history of ischemic heart disease and other diseases of the circulatory system: Secondary | ICD-10-CM | POA: Diagnosis not present

## 2015-04-04 DIAGNOSIS — F17210 Nicotine dependence, cigarettes, uncomplicated: Secondary | ICD-10-CM | POA: Diagnosis present

## 2015-04-04 DIAGNOSIS — F1020 Alcohol dependence, uncomplicated: Secondary | ICD-10-CM | POA: Diagnosis not present

## 2015-04-04 DIAGNOSIS — G4710 Hypersomnia, unspecified: Secondary | ICD-10-CM | POA: Diagnosis present

## 2015-04-04 DIAGNOSIS — E7800 Pure hypercholesterolemia, unspecified: Secondary | ICD-10-CM | POA: Diagnosis present

## 2015-04-04 DIAGNOSIS — G4700 Insomnia, unspecified: Secondary | ICD-10-CM | POA: Diagnosis present

## 2015-04-04 DIAGNOSIS — Z590 Homelessness: Secondary | ICD-10-CM | POA: Diagnosis not present

## 2015-04-04 DIAGNOSIS — F410 Panic disorder [episodic paroxysmal anxiety] without agoraphobia: Secondary | ICD-10-CM | POA: Diagnosis present

## 2015-04-04 DIAGNOSIS — J449 Chronic obstructive pulmonary disease, unspecified: Secondary | ICD-10-CM | POA: Diagnosis present

## 2015-04-04 MED ORDER — TRAZODONE HCL 100 MG PO TABS
100.0000 mg | ORAL_TABLET | Freq: Every evening | ORAL | Status: DC | PRN
Start: 2015-04-04 — End: 2015-04-05
  Administered 2015-04-04: 50 mg via ORAL

## 2015-04-04 MED ORDER — LORAZEPAM 1 MG PO TABS
1.0000 mg | ORAL_TABLET | Freq: Three times a day (TID) | ORAL | Status: DC | PRN
Start: 2015-04-04 — End: 2015-04-05
  Administered 2015-04-04 – 2015-04-05 (×2): 1 mg via ORAL
  Filled 2015-04-04 (×2): qty 1

## 2015-04-04 MED ORDER — TRAZODONE HCL 50 MG PO TABS
ORAL_TABLET | ORAL | Status: AC
  Filled 2015-04-04: qty 1

## 2015-04-04 NOTE — Progress Notes (Signed)
Admission note. Pt admitted from Obs. Pt irritable on approach. Pt minimal during admission process and forwarded little information. Pt endorses suicidal thoughts at this time. Pt verbally contracts for safety and denies a SI plan at this time. Pt reported that he have nothing to live for and feels hopeless. Pt would not specify any stressors on admit. Pt assessed and taken onto the unit. Writer reviewed policy with pt and pt signed required documents.

## 2015-04-04 NOTE — Progress Notes (Signed)
Patient c/o of anxiety on waking up. Offered and accepted Ativan 1 mg po prn for anxiety. Eating breakfast at this time. Will continue to monitor patient.

## 2015-04-04 NOTE — Progress Notes (Signed)
D: Pt endorses feeling anxious in mood this evening. Pt reports "thirstiness" that may be medication related. Pt is currently taking Seroquel. A pitcher of Gatorade was provided to patient. Pt reports being passive SI with no plan.  A: Writer administered scheduled and prn medications to pt, per MD orders. Continued support and availability as needed was extended to this pt. Pt verbally contracts for safety. Staff continues to monitor pt with q1615min checks.  R: No adverse drug reactions noted. Pt receptive to treatment. Pt remains safe at this time.

## 2015-04-04 NOTE — Progress Notes (Signed)
Adult Psychoeducational Group Note  Date:  04/04/2015 Time:  8:25 PM  Group Topic/Focus:  Wrap-Up Group:   The focus of this group is to help patients review their daily goal of treatment and discuss progress on daily workbooks.  Participation Level:  Did Not Attend    Cleotilde NeerJasmine S Eutimio Gharibian 04/04/2015, 8:39 PM

## 2015-04-04 NOTE — Progress Notes (Signed)
Nursing Shift Assessment:  Patient is moderately difficult to awaken but is cooperative in taking medications but stil admits to having active SI; denies any HI or AVH. Q15 minute checks for safety continuous, emotional support provided and therapeutic communication. Patient remains safe on unit and verbally contracts for safety.

## 2015-04-04 NOTE — Progress Notes (Signed)
Patient ID: Steven ChangRoger L [name], male   DOB: 09/16/1970, 44 y.o.   MRN: [id number] [facility]  Progress Note  04/04/2015 1:42 PM Steven ChangRoger L Hatlestad  MRN:  [id number] Subjective:   Patient states "I get angry every time I wake up. Another day that I do not want to live.  I feel more hopeless than ever. There is nothing to live for and I no longer have any hope. I still feel suicidal. The medications have not worked yet. I know you made an adjustment but I can't tell any difference. If you discharge me I will just leave and go kill myself. I have had time to think of multiple ways to go about it."   Objective:   Patient is seen and chart is reviewed. Steven [name] is reporting severe depressive symptoms that include anhedonia, depressed mood, low energy and suicidal thoughts with a plan. [facility]2015 on 10 mg of Prozac for depressive symptoms. Steven [name] [facility]/09/2015 where it is documented from notes that the patient also expressed suicidal plan to overdose on heroine due to numerous stressors such as financial problems and issues with homelessness. Patient appears from past notes to have chronic suicidal ideation. At this time he is reporting a specific plan with intent. He appears depressed during the assessment. Discussed the case with Dr. Jama Flavorsobos who recommends inpatient admission at this time. Patient has been unable to contract for his safety outside the hospital during his stay in the Observation Unit. He was started on lithium yesterday to address depressive symptoms and suicidal thoughts.   Principal Problem: Bipolar 1 disorder, depressed (HCC) Diagnosis:   Patient Active Problem List   Diagnosis Date Noted  . Alcohol use disorder, moderate, dependence (HCC) [F10.20] 04/02/2015  . Bipolar 1 disorder, depressed (HCC) [F31.9] 04/02/2015  . Bipolar I disorder, most recent episode depressed (HCC) [F31.30] 03/09/2015  . Alcohol abuse [F10.10]  03/08/2015  . Cocaine abuse [F14.10] 03/08/2015  . Substance induced mood disorder (HCC) [F19.94] 03/08/2015  . Suicidal ideation [R45.851]   . MDD (major depressive disorder), recurrent episode, moderate (HCC) [F33.1] 02/11/2015  . COPD (chronic obstructive pulmonary disease) (HCC) [J44.9] 02/09/2015  . Tobacco use disorder [F17.200] 02/09/2015  . Stimulant use disorder (cocaine) [F15.90] 02/09/2015  . Alcohol use disorder, severe, in sustained remission (HCC) [F10.21] 02/09/2015  . Ureteral stone [N20.1] 12/31/2014  . 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 CAD s/p CABG 2012 [facility] [I25.810] 11/28/2011  . Hyperlipidemia LDL goal <100 [E78.5] 03/13/2007  . Essential hypertension [I10] 03/13/2007  . GERD [K21.9] 03/13/2007   Total Time spent with patient: 20 minutes  Past Medical History:  Past Medical History  Diagnosis Date  . Coronary artery disease   . Hypertension   . Hypercholesteremia   . Kidney stone   . Tobacco use disorder   . Hx of CABG   . COPD (chronic obstructive pulmonary disease) (HCC)   . Bipolar disorder (manic depression) (HCC)   . Asthma     Past Surgical History  Procedure Laterality Date  . Coronary artery bypass graft    . Kidney stone surgery    . Appendectomy    . Lower extremity angiogram N/A 08/15/2012    Procedure: LOWER EXTREMITY ANGIOGRAM with possible PTA /stent;  Surgeon: Corky CraftsJayadeep S Varanasi, MD;  [facility];  Service: Cardiovascular;  Laterality: N/A;  . Abdominal angiogram  08/15/2012    Procedure: ABDOMINAL ANGIOGRAM;  Surgeon: Corky Crafts, MD;  Location: [facility] CATH LAB;  Service: Cardiovascular;;  . Cardioversion N/A 08/28/2012    Procedure: Pseudo Compression;  Surgeon: Chuck Hint, MD;  Location: [facility] CATH LAB;  Service: Cardiovascular;  Laterality: N/A;  . Cystoscopy with retrograde pyelogram, ureteroscopy and stent placement Right 12/31/2014    Procedure: CYSTOSCOPY  RIGHT  URETEROSCOPY WITH STONE RETREIVAL RIGHT RETROGRADE PYELOGRAM;  Surgeon: Malen Gauze, MD;  Location: [facility] ORS;  Service: Urology;  Laterality: Right;   Family History:  Family History  Problem Relation Age of Onset  . Heart disease Father   . Hyperlipidemia Father   . Heart disease Maternal Grandmother    Family Psychiatric  History: Reports a cousin completed suicide Social History:  History  Alcohol Use  . Yes    Comment: last drink 03/07/2015      History  Drug Use  . Yes  . Special: Cocaine    Comment: 10/23//2016    Social History   Social History  . Marital Status: Divorced    Spouse Name: N/A  . Number of Children: N/A  . Years of Education: N/A   Social History Main Topics  . Smoking status: Current Every Day Smoker -- 1.00 packs/day for 31 years    Types: Cigarettes  . Smokeless tobacco: Former Neurosurgeon    Quit date: 04/27/2013  . Alcohol Use: Yes     Comment: last drink 03/07/2015   . Drug Use: Yes    Special: Cocaine     Comment: 10/23//2016  . Sexual Activity: Yes    Birth Control/ Protection: Condom   Other Topics Concern  . None   Social History Narrative   Additional Social History:    Pain Medications: SEE MAR Prescriptions: SEE dMAR Over the Counter: SEE MAR Longest period of sobriety (when/how long): 18 months Apr 14 2013 to Mar 07 2015 Negative Consequences of Use: Financial, Legal, Personal relationships Withdrawal Symptoms: Other (Comment) (none currently) Name of Substance 1: Alcohol  1 - Age of First Use: 44 yrs old  1 - Amount (size/oz): 6 pack of beer 1 - Frequency: 1x use in 18 months (relapse) 1 - Duration: 1x use in 18 months 1 - Last Use / Amount: yesterday Name of Substance 2: Cocaine  2 - Age of First Use: 44 yrs old  2 - Amount (size/oz): "couple of lines" 2 - Frequency: 1x use in 18 months  2 - Duration: 1x use in the past 18 months  2 - Last Use / Amount: yesterday                Sleep: Fair  Appetite:   Good  Current Medications: Current Facility-Administered Medications  Medication Dose Route Frequency Provider Last Rate Last Dose  . acetaminophen (TYLENOL) tablet 650 mg  650 mg Oral Q6H PRN Thermon Leyland, NP      . acetaminophen (TYLENOL) tablet 650 mg  650 mg Oral Q4H PRN Charm Rings, NP      . alum & mag hydroxide-simeth (MAALOX/MYLANTA) 200-200-20 MG/5ML suspension 30 mL  30 mL Oral Q4H PRN Thermon Leyland, NP      . alum & mag hydroxide-simeth (MAALOX/MYLANTA) 200-200-20 MG/5ML suspension 30 mL  30 mL Oral PRN Charm Rings, NP      . FLUoxetine (PROZAC) capsule 20 mg  20 mg Oral Daily Thermon Leyland, NP   20 mg at 04/04/15 0745  . hydrOXYzine (ATARAX/VISTARIL) tablet 25 mg  25 mg Oral  Q6H PRN Thermon Leyland, NP   25 mg at 04/04/15 1021  . ibuprofen (ADVIL,MOTRIN) tablet 600 mg  600 mg Oral Q8H PRN Charm Rings, NP      . lithium carbonate capsule 300 mg  300 mg Oral BID WC Thermon Leyland, NP   300 mg at 04/04/15 0745  . LORazepam (ATIVAN) tablet 1 mg  1 mg Oral Q6H PRN Thermon Leyland, NP   1 mg at 04/04/15 1331  . magnesium hydroxide (MILK OF MAGNESIA) suspension 30 mL  30 mL Oral Daily PRN Thermon Leyland, NP      . nicotine (NICODERM CQ - dosed in mg/24 hours) patch 21 mg  21 mg Transdermal Daily PRN Charm Rings, NP   21 mg at 04/04/15 1020  . ondansetron (ZOFRAN) tablet 4 mg  4 mg Oral Q8H PRN Charm Rings, NP      . QUEtiapine (SEROQUEL) tablet 100 mg  100 mg Oral QHS Worthy Flank, NP   100 mg at 04/03/15 2112  . traZODone (DESYREL) tablet 50 mg  50 mg Oral QHS PRN Thermon Leyland, NP   50 mg at 04/03/15 2112    Lab Results:  No results found for this or any previous visit (from the past 48 hour(s)).  Physical Findings: AIMS: Facial and Oral Movements Muscles of Facial Expression: None, normal Lips and Perioral Area: None, normal Jaw: None, normal Tongue: None, normal,Extremity Movements Upper (arms, wrists, hands, fingers): None, normal Lower (legs, knees, ankles,  toes): None, normal, Trunk Movements Neck, shoulders, hips: None, normal, Overall Severity Severity of abnormal movements (highest score from questions above): None, normal Incapacitation due to abnormal movements: None, normal Patient's awareness of abnormal movements (rate only patient's report): No Awareness, Dental Status Current problems with teeth and/or dentures?: Yes (pt sts he needs new dentures.  "they are worn out") Does patient usually wear dentures?: No  CIWA:  CIWA-Ar Total: 0 COWS:  COWS Total Score: 1  Musculoskeletal: Strength & Muscle Tone: within normal limits Gait & Station: normal Patient leans: N/A  Psychiatric Specialty Exam: Review of Systems  Constitutional: Negative.   HENT: Negative.   Eyes: Negative.   Respiratory: Negative.   Cardiovascular: Negative.   Gastrointestinal: Negative.   Genitourinary: Negative.   Musculoskeletal: Negative.   Skin: Negative.   Neurological: Negative.   Endo/Heme/Allergies: Negative.   Psychiatric/Behavioral: Positive for depression, suicidal ideas and substance abuse (UDS positive for cocaine). Negative for hallucinations and memory loss. The patient is nervous/anxious. The patient does not have insomnia.     Blood pressure 113/71, pulse 81, temperature 97.9 F (36.6 C), temperature source Oral, resp. rate 18, height 5' 7.5" (1.715 m), weight 94.802 kg (209 lb), SpO2 97 %.Body mass index is 32.23 kg/(m^2).  General Appearance: Disheveled  Eye Solicitor::  Fair  Speech:  Clear and Coherent  Volume:  Normal  Mood:  Dysphoric and Hopeless  Affect:  Flat  Thought Process:  Goal Directed and Linear  Orientation:  Full (Time, Place, and Person)  Thought Content:  Symptoms, worries, concerns  Suicidal Thoughts:  Yes.  with intent/plan  Homicidal Thoughts:  No  Memory:  Immediate;   Fair Recent;   Fair Remote;   Fair  Judgement:  Impaired  Insight:  Shallow  Psychomotor Activity:  Decreased  Concentration:  Fair   Recall:  Fair  Fund of Knowledge:Good  Language: Good  Akathisia:  No  Handed:  Right  AIMS (if indicated):  Assets:  Communication Skills Desire for Improvement Leisure Time Resilience  ADL's:  Intact  Cognition: WNL  Sleep:  Number of Hours: 7   Treatment Plan Summary: Daily contact with patient to assess and evaluate symptoms and progress in treatment and Medication management  -Continue Prozac 20 mg daily for depression -Continue Seroquel 100 mg hs for mood control/insomnia -Continue Lithium 300 mg bid for depressive symptoms/chronic suicidal ideation -Continue to monitor in the [facility] to assess for continued active suicidal ideation -Admit to the Adult unit when a bed is available per discussion with Dr. Rober Minion, NP-C 04/04/2015, 1:42 PM Agree with NP Progress Note, as above  Nehemiah Massed, MD

## 2015-04-04 NOTE — Progress Notes (Signed)
D: up watching movie at beginning of shift.  Seeking snacks and drinks.  Communicating with other pts briefly.  Appears depressed and somewhat anxious.  Denies pain SI / HI.  Denies AH / VH. A: offered snacks and drinks.  Encouragement as needed.  PRN medications given as ordered.  Continue to monitor for safety and stability. R: Pt was sleeping.  Got up and stated "too hot in here and now cant sleep".  AC adjusted. Pt went back to sleep

## 2015-04-05 ENCOUNTER — Encounter (HOSPITAL_COMMUNITY): Payer: Self-pay | Admitting: Psychiatry

## 2015-04-05 DIAGNOSIS — F1020 Alcohol dependence, uncomplicated: Secondary | ICD-10-CM

## 2015-04-05 DIAGNOSIS — F1420 Cocaine dependence, uncomplicated: Secondary | ICD-10-CM

## 2015-04-05 DIAGNOSIS — F3130 Bipolar disorder, current episode depressed, mild or moderate severity, unspecified: Secondary | ICD-10-CM

## 2015-04-05 DIAGNOSIS — R45851 Suicidal ideations: Secondary | ICD-10-CM

## 2015-04-05 MED ORDER — CLONAZEPAM 0.5 MG PO TABS
0.5000 mg | ORAL_TABLET | Freq: Two times a day (BID) | ORAL | Status: DC | PRN
  Administered 2015-04-05 – 2015-04-06 (×4): 0.5 mg via ORAL
  Filled 2015-04-05 (×4): qty 1

## 2015-04-05 MED ORDER — QUETIAPINE FUMARATE 25 MG PO TABS
25.0000 mg | ORAL_TABLET | Freq: Three times a day (TID) | ORAL | Status: DC
  Administered 2015-04-05 – 2015-04-10 (×15): 25 mg via ORAL
  Filled 2015-04-05 (×21): qty 1

## 2015-04-05 MED ORDER — QUETIAPINE FUMARATE 300 MG PO TABS
300.0000 mg | ORAL_TABLET | Freq: Every day | ORAL | Status: DC
  Administered 2015-04-05 – 2015-04-09 (×5): 300 mg via ORAL
  Filled 2015-04-05 (×7): qty 1

## 2015-04-05 NOTE — [facility] Group Notes (Signed)
[facility] LCS[facility] Discharge Planning Group Note  04/05/2015 8:45 AM  Pt did not attend, declined invitation.   Chad CordialLauren Carter, LCSWA 04/05/2015 9:32 AM

## 2015-04-05 NOTE — H&P (Signed)
Psychiatric Admission Assessment Adult  Patient Identification: Steven ChangRoger L [name]  MRN:  [id number]  Date of Evaluation:  04/05/2015  Chief Complaint:  MDD  Principal Diagnosis: Bipolar 1 disorder, depressed (HCC)    Patient Active Problem List   Diagnosis Date Noted  . Alcohol use disorder, moderate, dependence (HCC) [F10.20] 04/02/2015  . Bipolar 1 disorder, depressed (HCC) [F31.9] 04/02/2015  . Bipolar I disorder, most recent episode depressed (HCC) [F31.30] 03/09/2015  . Alcohol abuse [F10.10] 03/08/2015  . Cocaine abuse [F14.10] 03/08/2015  . Substance induced mood disorder (HCC) [F19.94] 03/08/2015  . Suicidal ideation [R45.851]   . MDD (major depressive disorder), recurrent episode, moderate (HCC) [F33.1] 02/11/2015  . COPD (chronic obstructive pulmonary disease) (HCC) [J44.9] 02/09/2015  . Tobacco use disorder [F17.200] 02/09/2015  . Stimulant use disorder (cocaine) [F15.90] 02/09/2015  . Alcohol use disorder, severe, in sustained remission (HCC) [F10.21] 02/09/2015  . Ureteral stone [N20.1] 12/31/2014  . 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 CAD s/p CABG 2012 [facility] [I25.810] 11/28/2011  . Hyperlipidemia LDL goal <100 [E78.5] 03/13/2007  . Essential hypertension [I10] 03/13/2007  . GERD [K21.9] 03/13/2007   History of Present Illness: Steven [name] is a 44 year old Caucasian male. Was recently discharged from this behavioral health hospital on 03-11-15 & [facility]. Steven [name] reports today that he came to the hospital because he was about to commit suicide on Thursday morning. He says he was just working himself up to either overdose on heroin or ran in front of a moving trailer. He says he has been going through same shit for several months. Steven [name] says he did not attempt suicide, rather, drank a 6-pack beer & used 2-3 grams of cocaine. He says he is not feeling any better today, however, feels safe here. He reports history of  suicide attempt by cutting his wrists several years ago. He reports poor sleep, high anxiety levels. Says Seroquel helps him to sleep & his mood as well. Brant sites; financial, familia & housing issues as the reason for his worsening depression.  He sees Dartha LodgeAnthony steele on outpatient basis.  Associated Signs/Symptoms: Depression Symptoms:  depressed mood, anhedonia, insomnia, fatigue, difficulty concentrating, suicidal thoughts with specific plan, anxiety, panic attacks, loss of energy/fatigue, disturbed sleep,  (Hypo) Manic Symptoms:  Irritable Mood, Labiality of Mood,  Anxiety Symptoms:  Excessive Worry, Panic Symptoms, Social Anxiety,  Psychotic Symptoms:  when he gets high gets paranoid  PTSD Symptoms: Negative Total Time spent with patient: 1 hour  Past Psychiatric History:   Risk to Self: Is patient at risk for suicide?: Yes Risk to Others: No Prior Inpatient Therapy: [facility], [facility]  [facility] Out[facility]  Alcohol Screening: 1. How often do you have a drink containing alcohol?: Monthly or less 2. How many drinks containing alcohol do you have on a typical day when you are drinking?: 5 or 6 3. How often do you have six or more drinks on one occasion?: Never Preliminary Score: 2 4. How often during the last year have you found that you were not able to stop drinking once you had started?: Never 5. How often during the last year have you failed to do what was normally expected from you becasue of drinking?: Never 6. How often during the last year have you needed a first drink in the morning to get yourself going after a heavy drinking session?: Never 7. How often during the last year have you had a  feeling of guilt of remorse after drinking?: Never 8. How often during the last year have you been unable to remember what happened the night before because you had been drinking?: Never 9. Have you or someone else been injured as a result of your  drinking?: No 10. Has a relative or friend or a doctor or another health worker been concerned about your drinking or suggested you cut down?: Yes, but not in the last year Alcohol Use Disorder Identification Test Final Score (AUDIT): 5 Brief Intervention: AUDIT score less than 7 or less-screening does not suggest unhealthy drinking-brief intervention not indicated  Substance Abuse History in the last 12 months:  Yes.    Consequences of Substance Abuse: Legal Consequences:  7 DWI Withdrawal Symptoms:   had seizures when commig off  Previous Psychotropic Medications: Yes Had been on Latuda Haldol Depakote Prozac Trazodone  Psychological Evaluations: No   Past Medical History:  Past Medical History  Diagnosis Date  . Coronary artery disease   . Hypertension   . Hypercholesteremia   . Kidney stone   . Tobacco use disorder   . Hx of CABG   . COPD (chronic obstructive pulmonary disease) (HCC)   . Bipolar disorder (manic depression) (HCC)   . Asthma     Past Surgical History  Procedure Laterality Date  . Coronary artery bypass graft    . Kidney stone surgery    . Appendectomy    . Lower extremity angiogram N/A 08/15/2012    Procedure: LOWER EXTREMITY ANGIOGRAM with possible PTA /stent;  Surgeon: Corky Crafts, MD;  Location: [facility] CATH LAB;  Service: Cardiovascular;  Laterality: N/A;  . Abdominal angiogram  08/15/2012    Procedure: ABDOMINAL ANGIOGRAM;  Surgeon: Corky Crafts, MD;  Location: [facility] CATH LAB;  Service: Cardiovascular;;  . Cardioversion N/A 08/28/2012    Procedure: Pseudo Compression;  Surgeon: Chuck Hint, MD;  Location: [facility] CATH LAB;  Service: Cardiovascular;  Laterality: N/A;  . Cystoscopy with retrograde pyelogram, ureteroscopy and stent placement Right 12/31/2014    Procedure: CYSTOSCOPY  RIGHT URETEROSCOPY WITH STONE RETREIVAL RIGHT RETROGRADE PYELOGRAM;  Surgeon: Malen Gauze, MD;  Location: [facility] ORS;  Service: Urology;  Laterality: Right;    Family History:  Family History  Problem Relation Age of Onset  . Heart disease Father   . Hyperlipidemia Father   . Heart disease Maternal Grandmother    Family Psychiatric  History: Cousin committed suicide she was depressed, also addicted to opioids. Denies any other psychiatric history  Social History:  History  Alcohol Use  . Yes    Comment: last drink 03/07/2015      History  Drug Use  . Yes  . Special: Cocaine    Comment: 10/23//2016    Social History   Social History  . Marital Status: Divorced    Spouse Name: N/A  . Number of Children: N/A  . Years of Education: N/A   Social History Main Topics  . Smoking status: Current Every Day Smoker -- 1.00 packs/day for 31 years    Types: Cigarettes  . Smokeless tobacco: Former Neurosurgeon    Quit date: 04/27/2013  . Alcohol Use: Yes     Comment: last drink 03/07/2015   . Drug Use: Yes    Special: Cocaine     Comment: 10/23//2016  . Sexual Activity: Yes    Birth Control/ Protection: Condom   Other Topics Concern  . None   Social History Narrative  Mehtaab states: living with his  uncle, struggling to get his own apartment. Just got his GED. Divorced no children. 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 Had a heart attack, bypasses COPD plus Bipolar Disorder he is applying for disability  Additional Social History: Pain Medications: SEE MAR Prescriptions: SEE dMAR Over the Counter: SEE MAR Longest period of sobriety (when/how long): 18 months Apr 14 2013 to Mar 07 2015 Negative Consequences of Use: Financial, Legal, Personal relationships Withdrawal Symptoms: Other (Comment) (none currently) Name of Substance 1: Alcohol  1 - Age of First Use: 44 yrs old  1 - Amount (size/oz): 6 pack of beer 1 - Frequency: 1x use in 18 months (relapse) 1 - Duration: 1x use in 18 months 1 - Last Use / Amount: yesterday Name of Substance 2: Cocaine  2 - Age of First Use: 44 yrs old  2 -  Amount (size/oz): "couple of lines" 2 - Frequency: 1x use in 18 months  2 - Duration: 1x use in the past 18 months  2 - Last Use / Amount: yesterday  Allergies:  No Known Allergies Lab Results:  No results found for this or any previous visit (from the past 48 hour(s)).  Metabolic Disorder Labs:  Lab Results  Component Value Date   HGBA1C 5.2 02/10/2015   MPG 97 04/27/2013   MPG 123* 08/14/2012   No results found for: PROLACTIN Lab Results  Component Value Date   CHOL 220* 02/10/2015   TRIG 307* 02/10/2015   HDL 45 02/10/2015   CHOLHDL 4.9 02/10/2015   VLDL 61* 02/10/2015   LDLCALC 114* 02/10/2015   LDLCALC 120* 04/28/2013   Current Medications: Current Facility-Administered Medications  Medication Dose Route Frequency Provider Last Rate Last Dose  . acetaminophen (TYLENOL) tablet 650 mg  650 mg Oral Q6H PRN Thermon Leyland, NP      . acetaminophen (TYLENOL) tablet 650 mg  650 mg Oral Q4H PRN Charm Rings, NP      . alum & mag hydroxide-simeth (MAALOX/MYLANTA) 200-200-20 MG/5ML suspension 30 mL  30 mL Oral Q4H PRN Thermon Leyland, NP      . alum & mag hydroxide-simeth (MAALOX/MYLANTA) 200-200-20 MG/5ML suspension 30 mL  30 mL Oral PRN Charm Rings, NP      . FLUoxetine (PROZAC) capsule 20 mg  20 mg Oral Daily Thermon Leyland, NP   20 mg at 04/05/15 0981  . hydrOXYzine (ATARAX/VISTARIL) tablet 25 mg  25 mg Oral Q6H PRN Thermon Leyland, NP   25 mg at 04/04/15 2338  . ibuprofen (ADVIL,MOTRIN) tablet 600 mg  600 mg Oral Q8H PRN Charm Rings, NP      . lithium carbonate capsule 300 mg  300 mg Oral BID WC Thermon Leyland, NP   300 mg at 04/05/15 1914  . LORazepam (ATIVAN) tablet 1 mg  1 mg Oral Q8H PRN Thermon Leyland, NP   1 mg at 04/05/15 7829  . magnesium hydroxide (MILK OF MAGNESIA) suspension 30 mL  30 mL Oral Daily PRN Thermon Leyland, NP      . nicotine (NICODERM CQ - dosed in mg/24 hours) patch 21 mg  21 mg Transdermal Daily PRN Charm Rings, NP   21 mg at 04/04/15 1020  .  ondansetron (ZOFRAN) tablet 4 mg  4 mg Oral Q8H PRN Charm Rings, NP      . QUEtiapine (SEROQUEL) tablet 100 mg  100 mg Oral QHS Worthy Flank, NP  100 mg at 04/04/15 2106  . traZODone (DESYREL) tablet 100 mg  100 mg Oral QHS PRN Worthy Flank, NP   50 mg at 04/04/15 2340   PTA Medications: Prescriptions prior to admission  Medication Sig Dispense Refill Last Dose  . aspirin 325 MG EC tablet Take 325 mg by mouth daily.   October  . FLUoxetine (PROZAC) 10 MG capsule Take 1 capsule (10 mg total) by mouth daily. 30 capsule 3 October  . metoprolol (LOPRESSOR) 50 MG tablet Take 50 mg by mouth 2 (two) times daily.    03/08/2015 at unknown   . mometasone-formoterol (DULERA) 100-5 MCG/ACT AERO Inhale 2 puffs into the lungs 2 (two) times daily. (Patient not taking: Reported on 04/01/2015) 1 Inhaler 0 Not Taking  . traZODone (DESYREL) 100 MG tablet Take 1 tablet (100 mg total) by mouth at bedtime as needed for sleep. 7 tablet 0 October   Musculoskeletal: Strength & Muscle Tone: within normal limits Gait & Station: normal Patient leans: normal  Psychiatric Specialty Exam: Physical Exam  Constitutional: He is oriented to person, place, and time. He appears well-developed.  Obese  HENT:  Head: Normocephalic.  Eyes: Pupils are equal, round, and reactive to light.  Neck: Normal range of motion.  Cardiovascular: Normal rate.   Respiratory: Effort normal.  GI: Soft.  Genitourinary:  Denies any issues in this area  Musculoskeletal: Normal range of motion.  Neurological: He is alert and oriented to person, place, and time.  Skin: Skin is warm and dry.  Psychiatric: His speech is normal. Thought content normal. His mood appears anxious. His affect is not angry, not blunt, not labile and not inappropriate. He is withdrawn. Cognition and memory are normal. He expresses impulsivity. He exhibits a depressed mood.    Review of Systems  Constitutional: Positive for malaise/fatigue.  HENT:  Negative.   Eyes: Positive for blurred vision.  Respiratory: Negative.        Hx: COPD smoking again last couple of months  Cardiovascular: Negative.   Gastrointestinal: Positive for heartburn.  Genitourinary: Negative.   Musculoskeletal: Positive for joint pain.       Has broken a lot of bones  Skin: Negative.   Neurological: Positive for dizziness.  Endo/Heme/Allergies: Negative.   Psychiatric/Behavioral: Positive for depression, suicidal ideas and substance abuse. Negative for hallucinations and memory loss. The patient is nervous/anxious and has insomnia.     Blood pressure 104/57, pulse 99, temperature 97.9 F (36.6 C), temperature source Oral, resp. rate 16, height 5\' 10"  (1.778 m), weight 94.802 kg (209 lb), SpO2 97 %.Body mass index is 29.99 kg/(m^2).  General Appearance: Fairly Groomed  Patent attorney::  Fair  Speech:  Clear and Coherent  Volume:  fluctuates  Mood:  Anxious and Depressed  Affect:  Restricted  Thought Process:  Circumstantial  Orientation:  Full (Time, Place, and Person)  Thought Content:  symptoms events worries concerns  Suicidal Thoughts:  Yes.  without intent/plan  Homicidal Thoughts:  No  Memory:  Immediate;   Fair Recent;   Fair Remote;   Fair  Judgement:  Fair  Insight:  Present and Shallow  Psychomotor Activity:  Restlessness, High anxiety  Concentration:  Fair  Recall:  [employer] of Knowledge:Fair  Language: Fair  Akathisia:  No  Handed:  Right  AIMS (if indicated):     Assets:  Desire for Improvement  ADL's:  Intact  Cognition: WNL  Sleep:  Number of Hours: 4    Treatment Plan Summary: Daily  contact with patient to assess and evaluate symptoms and progress in treatment and Medication management: 1. Admit for crisis management and stabilization, estimated length of stay 3-5 days.  2. Medication management to reduce current symptoms to base line and improve the patient's overall level of functioning; Prozac 20 for depression, Hydroxyzine  25 mg for anxiety, Lithium 300 mf for mood stabilization, Lorazepam 1 mg for anxiety, Seroquel 100 mg for mood control, Trazodone 100 mg for insomnia.  3. Treat health problems as indicated.  4. Develop treatment plan to decrease risk of relapse upon discharge and the need for readmission.  5. Psycho-social education regarding relapse prevention and self care.  6. Health care follow up as needed for medical problems.  7. Review, reconcile, and reinstate any pertinent home medications for other health issues where appropriate. 8. Call for consults with hospitalist for any additional specialty patient care services as needed.   Observation Level/Precautions:  15 minute checks  Laboratory:  As per the ED  Psychotherapy:  Individual/group  Medications:  Resume his psychotropics, reassess and optimize response  Consultations: As needed   Discharge Concerns: Maintaining mood stabilization  Estimated LOS: 3-5 days  Other:     I certify that inpatient services furnished can reasonably be expected to improve the patient's condition.   Sanjuana Kava, PMHNP, [name]-BC 11/21/201610:20 AM

## 2015-04-05 NOTE — [facility] Suicide Risk Assessment (Signed)
[facility] Admission Suicide Risk Assessment   [facility] Demographic factors:  Male, Caucasian, Low socioeconomic status, Unemployed Current Mental Status:  Suicidal ideation indicated by patient, Suicide plan, Plan includes specific time, place, or method, Belief that plan would result in death Loss Factors:  Decline in physical health, Financial problems / change in socioeconomic status Historical Factors:  Prior suicide attempts, Family history of suicide, Family history of mental illness or substance abuse Risk Reduction Factors:  Sense of responsibility to family, Living with another person, especially a relative Total Time spent with patient: 30 minutes Principal Problem: Bipolar 1 disorder, depressed (HCC) Diagnosis:   Patient Active Problem List   Diagnosis Date Noted  . Alcohol use disorder, moderate, dependence (HCC) [F10.20] 04/02/2015  . Bipolar 1 disorder, depressed (HCC) [F31.9] 04/02/2015  . Alcohol abuse [F10.10] 03/08/2015  . Cocaine abuse [F14.10] 03/08/2015  . Substance induced mood disorder (HCC) [F19.94] 03/08/2015  . Suicidal ideation [R45.851]   . COPD (chronic obstructive pulmonary disease) (HCC) [J44.9] 02/09/2015  . Tobacco use disorder [F17.200] 02/09/2015  . Stimulant use disorder (cocaine) [F15.90] 02/09/2015  . Alcohol use disorder, severe, in sustained remission (HCC) [F10.21] 02/09/2015  . Ureteral stone [N20.1] 12/31/2014  . 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 CAD s/p CABG 2012 [facility] [I25.810] 11/28/2011  . Hyperlipidemia LDL goal <100 [E78.5] 03/13/2007  . Essential hypertension [I10] 03/13/2007  . GERD [K21.9] 03/13/2007     Continued Clinical Symptoms:  Alcohol Use Disorder Identification Test Final Score (AUDIT): 5 The "Alcohol Use Disorders Identification Test", Guidelines for Use in Primary Care, Second Edition.  World Science writerHealth Organization [facility](WHO). Score between 0-7:  no or  low risk or alcohol related problems. Score between 8-15:  moderate risk of alcohol related problems. Score between 16-19:  high risk of alcohol related problems. Score 20 or above:  warrants further diagnostic evaluation for alcohol dependence and treatment.   CLINICAL FACTORS:   Alcohol/Substance Abuse/Dependencies More than one psychiatric diagnosis Unstable or Poor Therapeutic Relationship Previous Psychiatric Diagnoses and Treatments   Musculoskeletal: Strength & Muscle Tone: within normal limits Gait & Station: normal Patient leans: N/A  Psychiatric Specialty Exam: Physical Exam  Review of Systems  Psychiatric/Behavioral: Positive for depression, suicidal ideas and substance abuse. The patient is nervous/anxious and has insomnia.   All other systems reviewed and are negative.   Blood pressure 104/57, pulse 99, temperature 97.9 F (36.6 C), temperature source Oral, resp. rate 16, height 5\' 10"  (1.778 m), weight 94.802 kg (209 lb), SpO2 97 %.Body mass index is 29.99 kg/(m^2).  General Appearance: Fairly Groomed  Patent attorneyye Contact::  Minimal  Speech:  Normal Rate  Volume:  Normal  Mood:  Anxious, Depressed, Irritable and Worthless  Affect:  Labile  Thought Process:  Goal Directed  Orientation:  Full (Time, Place, and Person)  Thought Content:  Paranoid Ideation and Rumination  Suicidal Thoughts:  Yes.  without intent/plan  Homicidal Thoughts:  No  Memory:  Immediate;   Fair Recent;   Fair Remote;   Fair  Judgement:  Impaired  Insight:  Fair  Psychomotor Activity:  Normal  Concentration:  Fair  Recall:  [employer]Fair  Fund of Knowledge:Fair  Language: Fair  Akathisia:  No  Handed:  Right  AIMS (if indicated):     Assets:  Communication Skills Desire for Improvement  Sleep:  Number of Hours: 4  Cognition: WNL  ADL's:  Intact     COGNITIVE FEATURES THAT CONTRIBUTE TO  RISK:  Closed-mindedness, Polarized thinking and Thought constriction (tunnel vision)    SUICIDE RISK:    Severe:  Frequent, intense, and enduring suicidal ideation, specific plan, no subjective intent, but some objective markers of intent (i.e., choice of lethal method), the method is accessible, some limited preparatory behavior, evidence of impaired self-control, severe dysphoria/symptomatology, multiple risk factors present, and few if any protective factors, particularly a lack of social support.  PLAN OF CARE: Patient will benefit from inpatient treatment and stabilization.  Estimated length of stay is 5-7 days.  Reviewed past medical records,treatment plan.  Continue Lithium 300 mg po bid - for mood swings , Li level in 5 days. Continue Prozac as scheduled for anxiety sx/affetcive sx. Will restart Seroquel , increase to 300 mg po qhs for mood lability, sleep issues as well as 25 mg po tid. Will add Clonopin 0.5 mg po bid prn for anxiety sx. Will continue to monitor vitals ,medication compliance and treatment side effects while patient is here.  Will monitor for medical issues as well as call consult as needed.  Reviewed labs cbc, cmp - wnl , UDS- pos for cocaine, BAL >100, EKG- WNL  ,will order Li level, hba1c. Lipid panel, PL,TSH. CSW will start working on disposition.  Patient to participate in therapeutic milieu .       Medical Decision Making:  Review of Psycho-Social Stressors (1), Review or order clinical lab tests (1), Review and summation of old records (2), Established Problem, Worsening (2), Review of Last Therapy Session (1), Review of Medication Regimen & Side Effects (2) and Review of New Medication or Change in Dosage (2)  I certify that inpatient services furnished can reasonably be expected to improve the patient's condition.   Shawnice Tilmon md 04/05/2015, 1:53 PM

## 2015-04-05 NOTE — [facility] Counselor (Signed)
Per RN, Pt has been tired as he did not get in until very early in the morning. Pt continues to be irritable. CSW will attempt assessment tomorrow.  Chad CordialLauren Carter, LCSWA Clinical Social Work [phone]

## 2015-04-05 NOTE — Progress Notes (Signed)
D: Pt presents with flat affect and depressed mood. Pt rates depression 8/10. Pt reported that he's having financial issues because he's not able to work as much this season. 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 Pt  Reported poor sleep last night and stated that the sleep meds were not effective. Pt irritable and endorses suicidal thoughts w/o plan. Pt verbally contracts for safety not to harm self. A: Medications administered as ordered per MD. Verbal support given. Pt encouraged to attend groups. 15 minute checks performed for safety.  R: Pt receptive to tx.

## 2015-04-05 NOTE — Progress Notes (Signed)
D: Pt continues to present with a flat affect and depressed mood. Pt was informed of his qhs medication changes to aid him with his insomnia. Pt did not attend group this evening. Pt acknowledges that his preference is not to be around others. Pt verbalizes a plan to begin attending groups on Tuesday. Pt endorses SI. [facility]. A: Pt received scheduled and prn medications to pt, per MD orders. Continued support and availability as needed was extended to this pt. Staff continues to monitor pt with q8415min checks.  R: No adverse drug reactions noted. Pt receptive to treatment. Pt verbally contracts for safety. Pt remains safe at this time.

## 2015-04-05 NOTE — [facility] Group Notes (Signed)
[facility] LCSW Group Therapy  04/05/2015 1:15pm  Type of Therapy:  Group Therapy vercoming Obstacles  Pt did not attend, declined invitation.    Chad CordialLauren Carter, LCSWA 04/05/2015 3:59 PM

## 2015-04-05 NOTE — Progress Notes (Signed)
Adult Psychoeducational Group Note  Date:  04/05/2015 Time:  8:25 PM  Group Topic/Focus:  Wrap-Up Group:   The focus of this group is to help patients review their daily goal of treatment and discuss progress on daily workbooks.  Participation Level:  Did Not Attend  Cleotilde NeerJasmine S Amarrah Meinhart 04/05/2015, 8:57 PM

## 2015-04-06 LAB — LIPID PANEL
CHOL/HDL RATIO: 6.6 ratio
Cholesterol: 284 mg/dL — ABNORMAL HIGH (ref 0–200)
HDL: 43 mg/dL (ref >40)
LDL Cholesterol: 165 mg/dL — ABNORMAL HIGH (ref 0–99)
Triglycerides: 378 mg/dL — ABNORMAL HIGH (ref <150)
VLDL: 76 mg/dL — AB (ref 0–40)

## 2015-04-06 LAB — TSH: TSH: 2.4 u[IU]/mL (ref 0.350–4.500)

## 2015-04-06 MED ORDER — CLONAZEPAM 0.5 MG PO TABS
0.5000 mg | ORAL_TABLET | Freq: Three times a day (TID) | ORAL | Status: DC | PRN
  Administered 2015-04-06 – 2015-04-12 (×17): 0.5 mg via ORAL
  Filled 2015-04-06 (×17): qty 1

## 2015-04-06 NOTE — [facility] Suicide Risk Assessment (Signed)
[facility] INPATIENT:  Family/Significant Other Suicide Prevention Education  Suicide Prevention Education:  Patient Refusal for Family/Significant Other Suicide Prevention Education: The patient Steven [name] has refused to provide written consent for family/significant other to be provided Family/Significant Other Suicide Prevention Education during admission and/or prior to discharge.  Physician notified.  Elaina HoopsCarter, Rimsha Trembley M 04/06/2015, 1:19 PM

## 2015-04-06 NOTE — [facility] Group Notes (Signed)
[facility] LCSW Group Therapy 04/06/2015 1:15 PM  Type of Therapy: Group Therapy- Feelings about Diagnosis  Pt did not attend, declined invitation.   Chad CordialLauren Carter, LCSWA 04/06/2015 4:34 PM

## 2015-04-06 NOTE — Progress Notes (Signed)
Recreation Therapy Notes  Animal-Assisted Activity (AAA) Program Checklist/Progress Notes Patient Eligibility Criteria Checklist & Daily Group note for Rec Tx Intervention  Date: 11.22.2016 Time: 2:45pm Location: [address] Dayroom   AAA/T Program Assumption of Risk Form signed by Patient/ or Parent Legal Guardian yes  Patient is free of allergies or sever asthma yes  Patient reports no fear of animals yes  Patient reports no history of cruelty to animals yes  Patient understands his/her participation is voluntary yes  Patient washes hands before animal contact yes  Patient washes hands after animal contact yes  Behavioral Response: Did not attend.   Deric Bocock L Keiana Tavella, LRT/CTRS  Quanell Loughney L 04/06/2015 3:25 PM 

## 2015-04-06 NOTE — Progress Notes (Signed)
D:Per patient self inventory form pt reports he slept fair last night with the use of sleep medication. He reports a good appetite, low energy level, poor concentration. He rates depression 9/10, hopelessness 10/10, anxiety 10/10- all on 0-10 scale, 10 being the worse. Pt denies HI. Denies AVH. Pt reports active suicidal thoughts. Pt denies plan to hurt self, reports he feels safe in the hospital and comfortable talking to staff; verbally contracts for safety. Pt not attending groups on the unit. Noted in bed throughout the shift.   A:Special checks q 15 mins in place for safety. Medication administered per MD order (See eMAR). Encouragement and support provided. PRN medication given for c/o anxiety.  R:Safety maintained. Complaint with medication regimen. Will continue to monitor.

## 2015-04-06 NOTE — Progress Notes (Addendum)
[facility] MD Progress Note  04/06/2015 5:02 PM Steven [name]  MRN:  [id number] Subjective:   Patient states " I guess I am feeling a little bit better " States this morning had a panic attack, while attending a group. Denies any medication side effects at this time Objective : I have discussed case with treatment team and have met with patient. Patient is a 44 year old male, known to [place] from prior psychiatric admissions, most recently in October /16.  History of Alcohol and Cocaine Abuse. Admitted due to worsening depression , suicidal ideations, and recent relapse. States he relapsed in part to " to try to get the courage of committing suicide ", but states he decided against it and decided to " come back to the hospital and give myself another chance ". States he had stopped taking psychiatric medications a few days prior to admission At this time continues to report depression, but states he feels better and safer in hospital setting. States " medications do seem to be helping a little bit". At this time denies medication side effects. No disruptive or agitated symptoms on unit Little milieu/ group participation thus far . No symptoms of withdrawal- no tremors, no diaphoresis, no agitation or restlessness, vitals stable TSH WNL- Lipid panel- (+) hypertriglyceridemia, hypercholesterolemia . Principal Problem: Bipolar 1 disorder, depressed ([place]) Diagnosis:   Patient Active Problem List   Diagnosis Date Noted  . Cocaine use disorder, moderate, dependence ([place]) [F14.20]   . Alcohol use disorder, moderate, dependence ([place]) [F10.20] 04/02/2015  . Bipolar 1 disorder, depressed ([place]) [F31.9] 04/02/2015  . Alcohol abuse [F10.10] 03/08/2015  . Cocaine abuse [F14.10] 03/08/2015  . Substance induced mood disorder ([place]) [F19.94] 03/08/2015  . Suicidal ideation [R45.851]   . COPD (chronic obstructive pulmonary disease) ([place]) [J44.9] 02/09/2015  . Tobacco use disorder [F17.200] 02/09/2015  . Stimulant  use disorder (cocaine) [F15.90] 02/09/2015  . Alcohol use disorder, severe, in sustained remission ([place]) [F10.21] 02/09/2015  . Ureteral stone [N20.1] 12/31/2014  . Claudication of left lower extremity ([place]) [I73.9] 08/14/2012  . CAD s/p CABG 2012 [facility] [I25.810] 11/28/2011  . Hyperlipidemia LDL goal <100 [E78.5] 03/13/2007  . Essential hypertension [I10] 03/13/2007  . GERD [K21.9] 03/13/2007   Total Time spent with patient: 20 minutes   Past Medical History:  Past Medical History  Diagnosis Date  . Coronary artery disease   . Hypertension   . Hypercholesteremia   . Kidney stone   . Tobacco use disorder   . Hx of CABG   . COPD (chronic obstructive pulmonary disease) ([place])   . Bipolar disorder (manic depression) ([place])   . Asthma     Past Surgical History  Procedure Laterality Date  . Coronary artery bypass graft    . Kidney stone surgery    . Appendectomy    . Lower extremity angiogram N/A 08/15/2012    Procedure: LOWER EXTREMITY ANGIOGRAM with possible PTA /stent;  Surgeon: Jettie Booze, MD;  Location: [facility] CATH LAB;  Service: Cardiovascular;  Laterality: N/A;  . Abdominal angiogram  08/15/2012    Procedure: ABDOMINAL ANGIOGRAM;  Surgeon: Jettie Booze, MD;  Location: [facility] CATH LAB;  Service: Cardiovascular;;  . Cardioversion N/A 08/28/2012    Procedure: Pseudo Compression;  Surgeon: Angelia Mould, MD;  Location: [facility] CATH LAB;  Service: Cardiovascular;  Laterality: N/A;  . Cystoscopy with retrograde pyelogram, ureteroscopy and stent placement Right 12/31/2014    Procedure: CYSTOSCOPY  RIGHT URETEROSCOPY WITH STONE RETREIVAL RIGHT RETROGRADE PYELOGRAM;  Surgeon: Cleon Gustin, MD;  Location: [facility] ORS;  Service: Urology;  Laterality: Right;   Family History:  Family History  Problem Relation Age of Onset  . Heart disease Father   . Hyperlipidemia Father   . Heart disease Maternal Grandmother     Social History:  History  Alcohol Use  . Yes     Comment: last drink 03/07/2015      History  Drug Use  . Yes  . Special: Cocaine    Comment: 10/23//2016    Social History   Social History  . Marital Status: Divorced    Spouse Name: N/A  . Number of Children: N/A  . Years of Education: N/A   Social History Main Topics  . Smoking status: Current Every Day Smoker -- 1.00 packs/day for 31 years    Types: Cigarettes  . Smokeless tobacco: Former Systems developer    Quit date: 04/27/2013  . Alcohol Use: Yes     Comment: last drink 03/07/2015   . Drug Use: Yes    Special: Cocaine     Comment: 10/23//2016  . Sexual Activity: Yes    Birth Control/ Protection: Condom   Other Topics Concern  . None   Social History Narrative   Additional Social History:    Pain Medications: SEE MAR Prescriptions: SEE dMAR Over the Counter: SEE MAR Longest period of sobriety (when/how long): 18 months Apr 14 2013 to Mar 07 2015 Negative Consequences of Use: Financial, Legal, Personal relationships Withdrawal Symptoms: Other (Comment) (none currently) Name of Substance 1: Alcohol  1 - Age of First Use: 44 yrs old  1 - Amount (size/oz): 6 pack of beer 1 - Frequency: 1x use in 18 months (relapse) 1 - Duration: 1x use in 18 months 1 - Last Use / Amount: yesterday Name of Substance 2: Cocaine  2 - Age of First Use: 44 yrs old  2 - Amount (size/oz): "couple of lines" 2 - Frequency: 1x use in 18 months  2 - Duration: 1x use in the past 18 months  2 - Last Use / Amount: yesterday  Sleep: fair   Appetite: "OK"   Current Medications: Current Facility-Administered Medications  Medication Dose Route Frequency Provider Last Rate Last Dose  . acetaminophen (TYLENOL) tablet 650 mg  650 mg Oral Q6H PRN Niel Hummer, NP      . acetaminophen (TYLENOL) tablet 650 mg  650 mg Oral Q4H PRN Patrecia Pour, NP      . alum & mag hydroxide-simeth (MAALOX/MYLANTA) 200-200-20 MG/5ML suspension 30 mL  30 mL Oral Q4H PRN Niel Hummer, NP      . alum & mag  hydroxide-simeth (MAALOX/MYLANTA) 200-200-20 MG/5ML suspension 30 mL  30 mL Oral PRN Patrecia Pour, NP   30 mL at 04/06/15 0250  . clonazePAM (KLONOPIN) tablet 0.5 mg  0.5 mg Oral BID PRN Encarnacion Slates, NP   0.5 mg at 04/06/15 1459  . FLUoxetine (PROZAC) capsule 20 mg  20 mg Oral Daily Niel Hummer, NP   20 mg at 04/06/15 0753  . hydrOXYzine (ATARAX/VISTARIL) tablet 25 mg  25 mg Oral Q6H PRN Niel Hummer, NP   25 mg at 04/06/15 0756  . ibuprofen (ADVIL,MOTRIN) tablet 600 mg  600 mg Oral Q8H PRN Patrecia Pour, NP   600 mg at 04/05/15 2113  . lithium carbonate capsule 300 mg  300 mg Oral BID WC Niel Hummer, NP   300 mg at 04/06/15 1618  .  magnesium hydroxide (MILK OF MAGNESIA) suspension 30 mL  30 mL Oral Daily PRN Niel Hummer, NP      . nicotine (NICODERM CQ - dosed in mg/24 hours) patch 21 mg  21 mg Transdermal Daily PRN Patrecia Pour, NP   21 mg at 04/06/15 0631  . ondansetron (ZOFRAN) tablet 4 mg  4 mg Oral Q8H PRN Patrecia Pour, NP      . QUEtiapine (SEROQUEL) tablet 25 mg  25 mg Oral TID Encarnacion Slates, NP   25 mg at 04/06/15 1618  . QUEtiapine (SEROQUEL) tablet 300 mg  300 mg Oral QHS Encarnacion Slates, NP   300 mg at 04/05/15 2113    Lab Results:  Results for orders placed or performed during the hospital encounter of 04/02/15 (from the past 48 hour(s))  Lipid panel     Status: Abnormal   Collection Time: 04/06/15  6:40 AM  Result Value Ref Range   Cholesterol 284 (H) 0 - 200 mg/dL   Triglycerides 378 (H) <150 mg/dL   HDL 43 >40 mg/dL   Total CHOL/HDL Ratio 6.6 RATIO   VLDL 76 (H) 0 - 40 mg/dL   LDL Cholesterol 165 (H) 0 - 99 mg/dL    Comment:        Total Cholesterol/HDL:CHD Risk Coronary Heart Disease Risk Table                     Men   Women  1/2 Average Risk   3.4   3.3  Average Risk       5.0   4.4  2 X Average Risk   9.6   7.1  3 X Average Risk  23.4   11.0        Use the calculated Patient Ratio above and the CHD Risk Table to determine the patient's CHD Risk.         ATP III CLASSIFICATION (LDL):  <100     mg/dL   Optimal  100-129  mg/dL   Near or Above                    Optimal  130-159  mg/dL   Borderline  160-189  mg/dL   High  >190     mg/dL   Very High Performed at [facility]   TSH     Status: None   Collection Time: 04/06/15  6:40 AM  Result Value Ref Range   TSH 2.400 0.350 - 4.500 uIU/mL    Comment: Performed at [facility]    Physical Findings: AIMS: Facial and Oral Movements Muscles of Facial Expression: None, normal Lips and Perioral Area: None, normal Jaw: None, normal Tongue: None, normal,Extremity Movements Upper (arms, wrists, hands, fingers): None, normal Lower (legs, knees, ankles, toes): None, normal, Trunk Movements Neck, shoulders, hips: None, normal, Overall Severity Severity of abnormal movements (highest score from questions above): None, normal Incapacitation due to abnormal movements: None, normal Patient's awareness of abnormal movements (rate only patient's report): No Awareness, Dental Status Current problems with teeth and/or dentures?: Yes (pt sts he needs new dentures.  "they are worn out") Does patient usually wear dentures?: No  CIWA:  CIWA-Ar Total: 0 COWS:  COWS Total Score: 1  Musculoskeletal: Strength & Muscle Tone: within normal limits Gait & Station: normal Patient leans: N/A  Psychiatric Specialty Exam: ROS  Denies headache, denies chest pain, denies shortness of breath, denies vomiting   Blood pressure  141/68, pulse 84, temperature 97.2 F (36.2 C), temperature source Oral, resp. rate 16, height 5' 10" (1.778 m), weight 209 lb (94.802 kg), SpO2 97 %.Body mass index is 29.99 kg/(m^2).  General Appearance: Fairly Groomed  Engineer, water::  Fair  Speech:  Normal Rate  Volume:  Normal  Mood:  Depressed, but states slightly better than before admission  Affect:  constricted , but reactive and smiles at times appropriately  Thought Process:  Goal Directed and Linear   Orientation:  Other:  fully alert and attentive   Thought Content:  denies hallucinations, no delusions expressed, does not appear internally preoccupied   Suicidal Thoughts:  No denies any current suicidal ideations, denies any self injurious plans or intentions, and at this time contracts for safety on unit  Homicidal Thoughts:  No  Memory:  recent and remote grossly intact   Judgement:  Fair  Insight:  Present  Psychomotor Activity:  Normal  Concentration:  Good  Recall:  Good  Fund of Knowledge:Good  Language: Good  Akathisia:  Negative  Handed:  Right  AIMS (if indicated):     Assets:  Communication Skills Desire for Improvement Resilience  ADL's:  Intact  Cognition: WNL  Sleep:  Number of Hours: 5  Assessment- patient is a 44 year old man, with recent psychiatric admissions for depression. Presented with worsening depression, suicidal ideations, and recently relapse on Alcohol , cocaine.  Has been diagnosed with Bipolar Disorder in the past . At this time presents depressed, but states he is feeling " a little better", and at present denies any suicidal ideations and contracts for safety on unit . He is not endorsing medication side effects at this time Treatment Plan Summary: Daily contact with patient to assess and evaluate symptoms and progress in treatment, Medication management, Plan inpatient admission and medications as below  Encourage milieu , group participation to work on symptom reduction and coping skills Continue to work on relapse prevention and early recovery efforts . Continue Lithium Carbonate  300 mgrs BID for management of mood disorder  Continue Seroquel 25 mgrs TID and 300 mgrs QHS  for management of mood disorder  Continue  Prozac 20 mgrs QDAY for depression Continue Klonopin 0.5 mgrs Q 8 hours PRN for severe anxiety, as needed  Continue Vistaril 25 mgrs Q 6 hours PRN for anxiety, as needed  Check Lithium serum level  ,  04/06/2015, 5:02  PM
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
## 2015-04-06 NOTE — [facility] Group Notes (Signed)
[facility] Group Notes:  (Nursing/MHT/Case Management/Adjunct)  Date:  04/06/2015  Time:  0930  Type of Therapy:  Nurse Education  Participation Level:  Did Not Attend  Dara Hoyershley N Jodean Valade 04/06/2015, 5:43 PM

## 2015-04-06 NOTE — Progress Notes (Signed)
Adult Psychoeducational Group Note  Date:  04/06/2015 Time:  9:37 PM  Group Topic/Focus:  Wrap-Up Group:   The focus of this group is to help patients review their daily goal of treatment and discuss progress on daily workbooks.  Participation Level:  Did Not Attend  Participation Quality:  Did not attend  Affect:  Did not attend  Cognitive:  Did not attend  Insight: None  Engagement in Group:  Did not attend  Modes of Intervention:  Did not attend  Additional Comments:  Patient did not attend wrap up group tonight.   Marquest Gunkel L Gloristine Turrubiates 04/06/2015, 9:37 PM

## 2015-04-07 DIAGNOSIS — F319 Bipolar disorder, unspecified: Principal | ICD-10-CM

## 2015-04-07 LAB — HEMOGLOBIN A1C
Hgb A1c MFr Bld: 6.5 % — ABNORMAL HIGH (ref 4.8–5.6)
MEAN PLASMA GLUCOSE: 140 mg/dL

## 2015-04-07 LAB — PROLACTIN: PROLACTIN: 18.2 ng/mL — AB (ref 4.0–15.2)

## 2015-04-07 MED ORDER — PANTOPRAZOLE SODIUM 40 MG PO TBEC
40.0000 mg | DELAYED_RELEASE_TABLET | Freq: Every day | ORAL | Status: DC
  Administered 2015-04-07 – 2015-04-13 (×7): 40 mg via ORAL
  Filled 2015-04-07 (×4): qty 1
  Filled 2015-04-07: qty 7
  Filled 2015-04-07 (×5): qty 1

## 2015-04-07 MED ORDER — PANTOPRAZOLE SODIUM 40 MG PO TBEC: 40.0000 mg | DELAYED_RELEASE_TABLET | Freq: Every day | ORAL | Status: DC

## 2015-04-07 MED ORDER — ALBUTEROL SULFATE HFA 108 (90 BASE) MCG/ACT IN AERS: 2.0000 | INHALATION_SPRAY | Freq: Four times a day (QID) | RESPIRATORY_TRACT | Status: DC | PRN

## 2015-04-07 MED ORDER — HALOPERIDOL 0.5 MG PO TABS
0.5000 mg | ORAL_TABLET | Freq: Three times a day (TID) | ORAL | Status: DC
  Administered 2015-04-07 – 2015-04-12 (×15): 0.5 mg via ORAL
  Filled 2015-04-07 (×25): qty 1
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
## 2015-04-07 NOTE — Tx Team (Signed)
Interdisciplinary Treatment Plan Update (Adult) Date: 04/07/2015   Date: 04/07/2015 10:10 AM  Progress in Treatment:  Attending groups: No Participating in groups: No Taking medication as prescribed: Yes  Tolerating medication: Yes  Family/Significant othe contact made: No, Steven [name] declines Patient understands diagnosis: Yes Discussing patient identified problems/goals with staff: Yes  Medical problems stabilized or resolved: Yes  Denies suicidal/homicidal ideation: No, endorses passive SI Patient has not harmed self or Others: Yes   New problem(s) identified: None identified at this time.   Discharge Plan or Barriers: CSW will assess for appropriate discharge plan and relevant barriers.   Additional comments: n/a   Reason for Continuation of Hospitalization:  Anxiety Depression Medication stabilization Suicidal ideation  Estimated length of stay: 3-5 days  Review of initial/current patient goals per problem list:   1.  Goal(s): Patient will participate in aftercare plan  Met:  No  Target date: 3-5 days from date of admission   As evidenced by: Patient will participate within aftercare plan AEB aftercare provider and housing plan at discharge being identified.  04/07/15: CSW to work with Steven [name] to assess for appropriate discharge plan and faciliate appointments and referrals as needed prior to d/c.  2.  Goal (s): Patient will exhibit decreased depressive symptoms and suicidal ideations.  Met:  No  Target date: 3-5 days from date of admission   As evidenced by: Patient will utilize self rating of depression at 3 or below and demonstrate decreased signs of depression or be deemed stable for discharge by MD. 04/07/15: Steven [name] was admitted with symptoms of depression, rating 10/10. Steven [name] continues to present with flat affect and depressive symptoms.  Steven [name] will demonstrate decreased symptoms of depression and rate depression at 3/10 or lower prior to discharge.  3.  Goal(s): Patient will  demonstrate decreased signs and symptoms of anxiety.  Met:  No  Target date: 3-5 days from date of admission   As evidenced by: Patient will utilize self rating of anxiety at 3 or below and demonstrated decreased signs of anxiety, or be deemed stable for discharge by MD 04/07/15: Steven [name] was admitted with increased levels of anxiety and is currently rating those symptoms highly. Steven [name] will demonstrated decreased symptoms of anxiety and rate it at 3/10 prior to d/c.  Attendees:  Patient:    Family:    Physician: Dr. Parke Poisson, MD  04/07/2015 10:10 AM  Nursing: Lars Pinks, RN Case manager  04/07/2015 10:10 AM  Clinical Social Worker Peri Maris, [place] 04/07/2015 10:10 AM  Other: Tilden Fossa, [place] 04/07/2015 10:10 AM  Clinical: Festus Aloe, RN 04/07/2015 10:10 AM  Other: , RN Charge Nurse 04/07/2015 10:10 AM  Other:     Peri Maris, [place] Social Work [phone]

## 2015-04-07 NOTE — Progress Notes (Signed)
Pt spent the first part of the evening in his room in bed.  He came out into the hall for snack time.  He reports that he has suicidal thoughts off and on all the time every day, but that he can contract for safety with Clinical research associatewriter.  He denies HI/AVH.  He said he does not do well around people, so he does not go to groups.  He feels he is getting better with the meds he is taking.  He plans to return home at discharge.  Pt was encouraged to make his needs known to staff.  Conversation was minimal with pt.  Support and encouragement offered.  Safety maintained with q15 minute checks.

## 2015-04-07 NOTE — Progress Notes (Signed)
Recreation Therapy Notes   Date: 11.23.2016 Time: 9:30am Location: [address] Hall Group Room   Group Topic: Stress Management  Goal Area(s) Addresses:  Patient will actively participate in stress management techniques presented during session.   Behavioral Response: Did not attend.   Marykay Lexenise L Yvonnia Tango, LRT/CTRS  Ellard Nan L 04/07/2015 9:58 AM

## 2015-04-07 NOTE — Progress Notes (Signed)
D:Per patient self inventory form pt reports he slept fair last night with the use of sleep medication. Pt reprots a good appetite, low energy level, poor concentration. Pt rates depression 9/10, hopelesness 9/10, anxiety 10/10- all on 0-10 scale, 10 being the worse. Pt reports passive SI. Reports he feels safe in the hospital and that he has no plan to hurt himself. Pt also reports he feels comfortable talking to staff. Pt denies HI. Denies AVH. Pt reports his goal today is "to make a few calls" Pt c/o anxiety, presents with flat affect.  A:Special checks q 15 mins in place for safety. Medication administered per MD order(see eMAR). PRN medication given per pt c/o anxiety. Encourgement and support provided.  R: Safety maintained. Compliant with medication regimen. Will continue to monitor.

## 2015-04-07 NOTE — [facility] Group Notes (Signed)
[facility] Note  04/07/2015 8:45 AM  Pt did not attend, declined invitation.   Steven CordialLauren [name], LCSWA 04/07/2015 10:09 AM

## 2015-04-07 NOTE — [facility] Group Notes (Signed)
[facility] LCSW Group Therapy 04/07/2015 1:15 PM  Type of Therapy: Group Therapy- Emotion Regulation  Participation Level: Minimal  Participation Quality:  N/A  Affect: Flat  Cognitive: Alert and Oriented   Insight:  Continuing to assess  Engagement in Therapy: Limited  Modes of Intervention: Clarification, Confrontation, Discussion, Education, Exploration, Limit-setting, Orientation, Problem-solving, Rapport Building, Dance movement psychotherapisteality Testing, Socialization and Support  Summary of Progress/Problems: The topic for group today was emotional regulation. This group focused on both positive and negative emotion identification and allowed group members to process ways to identify feelings, regulate negative emotions, and find healthy ways to manage internal/external emotions. Group members were asked to reflect on a time when their reaction to an emotion led to a negative outcome and explored how alternative responses using emotion regulation would have benefited them. Group members were also asked to discuss a time when emotion regulation was utilized when a negative emotion was experienced. Pt came to group late and did not participate verbally. He is observed to be withdrawn.   Steven CordialLauren [name], LCSWA 04/07/2015 2:33 PM

## 2015-04-08 LAB — LITHIUM LEVEL: Lithium Lvl: 0.19 mmol/L — ABNORMAL LOW (ref 0.60–1.20)

## 2015-04-08 NOTE — Progress Notes (Signed)
Pt reports his day has been ok.  He does not seem as irritable as last evening.  He still isolates somewhat, and is not going to groups.  He denies SI/HI/AVH at this time.  He still c/o high anxiety and asks for prns often.  He is unsure of his discharge date, or where he will go at discharge.  Pt makes his needs known to staff.  Support and encouragement offered.  Discharge plans are in process.  Safety maintained with q15 minute checks.

## 2015-04-08 NOTE — Progress Notes (Signed)
Patient ID: Steven ChangRoger L Bhavsar, male   DOB: 12/02/1970, 44 y.o.   MRN: [id number]   D: Pt has been very flat and depressed on the unit today. Pt has also reported lots of anxiety, and has requested medication all day. Pt reported that his depression was a 8, his hopelessness was a 9, and his anxiety was a 9. Pt reported that his goal for today was to make it through the day without hurting self. Pt reported that he was positive SI, but did contract for safety. Pt reported being negative HI, no AH/VH noted. A: 15 min checks continued for patient safety. R: Pt safety maintained.
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
## 2015-04-09 NOTE — Progress Notes (Signed)
Recreation Therapy Notes  Date: 11.25.2016 Time: 9:30am Location: [address] Group Room   Group Topic: Stress Management  Goal Area(s) Addresses:  Patient will actively participate in stress management techniques presented during session.   Behavioral Response: Did not attend.   Marykay Lexenise L Oliana Gowens, LRT/CTRS        Shelia Magallon L 04/09/2015 10:23 AM

## 2015-04-09 NOTE — Progress Notes (Signed)
D. Pt had been up and visible in milieu, however minimal interaction or participation. Pt endorsed depression but able to contract for safety on the unit. Pt requested and received all medications without incident this evening and retired to his room shortly after 9pm stating that he was tired. A. Support and encouragement provided. R. Safety maintained, will continue to monitor.

## 2015-04-09 NOTE — [facility] Group Notes (Signed)
[facility] LCSW Aftercare Discharge Planning Group Note   04/09/2015 9:40 AM  Participation Quality:  Patient invited but chose to remain in room.    PICKETT JR, Callahan Wild C   

## 2015-04-09 NOTE — Progress Notes (Signed)
D: Pt presents with flat affect and anxious mood. Pt med seeking and continues to ask for anxiety meds every hour. Writer explained to pt, prn  Order times. Writer encouraged pt to allow scheduled meds to become effective before requesting prn meds. Pt endorses suicidal thoughts w/o plan. Pt contracts for safety. Pt rates depression 9/10. Anxiety 9/10. Hopeless 9/10.  A:Medications administered as ordered per MD. Verbal support given. Pt encouraged to attend groups. 15 minute checks performed for safety. Pt encouraged to use coping skills for anxiety. R: Pt receptive to tx.
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
## 2015-04-10 MED ORDER — QUETIAPINE FUMARATE 50 MG PO TABS
50.0000 mg | ORAL_TABLET | Freq: Three times a day (TID) | ORAL | Status: DC
  Administered 2015-04-10 – 2015-04-12 (×6): 50 mg via ORAL
  Filled 2015-04-10 (×15): qty 1

## 2015-04-10 MED ORDER — HYDROXYZINE HCL 50 MG PO TABS
50.0000 mg | ORAL_TABLET | Freq: Four times a day (QID) | ORAL | Status: DC | PRN
  Administered 2015-04-11 – 2015-04-12 (×7): 50 mg via ORAL
  Filled 2015-04-10: qty 1
  Filled 2015-04-10: qty 7
  Filled 2015-04-10 (×6): qty 1

## 2015-04-10 MED ORDER — QUETIAPINE FUMARATE 400 MG PO TABS
400.0000 mg | ORAL_TABLET | Freq: Every day | ORAL | Status: DC
  Administered 2015-04-10 – 2015-04-11 (×2): 400 mg via ORAL
  Filled 2015-04-10 (×2): qty 1
  Filled 2015-04-10: qty 2
  Filled 2015-04-10 (×3): qty 1

## 2015-04-10 NOTE — [facility] Group Notes (Signed)
[facility] Group Notes:  (Nursing/MHT/Case Management/Adjunct)  Date:  04/10/2015  Time:  11:03 AM  Type of Therapy:  Psychoeducational Skills  Participation Level:  Did Not Attend  Participation Quality:  Did Not Attend  Affect:  Did Not Attend  Cognitive:  Did Not Attend  Insight:  None  Engagement in Group:  Did Not Attend  Modes of Intervention:  Did Not Attend  Summary of Progress/Problems: Pt did not attend patient self inventory group.   Steven [name] 04/10/2015, 11:03 AM 

## 2015-04-10 NOTE — Progress Notes (Signed)
Patient ID: Steven [name], male   DOB: 11-11-1970, 44 y.o.   MRN: [id number] Patient ID: Steven [name], male   DOB: 04/06/1971, 44 y.o.   MRN: [id number] [name] MD Progress Note  04/10/2015 3:56 PM [name]  MRN:  [id number] Subjective:  Steven [name] states that he still feels down, anxious & unable to sleep at night".  Objective; Alert, tolerating medications, increased Seroquel to 400 mg q hs & 50 mg tid for anxiety with addition of haldol as it helped before. Slight improvement. No delusions. Mood not agitated. Denies hallucinations but remains evasive of history and drug use. Would like to sleep better at night.  Principal Problem: Bipolar 1 disorder, depressed (HCC) Diagnosis:   Patient Active Problem List   Diagnosis Date Noted  . Cocaine use disorder, moderate, dependence (HCC) [F14.20]   . Alcohol use disorder, moderate, dependence (HCC) [F10.20] 04/02/2015  . Bipolar 1 disorder, depressed (HCC) [F31.9] 04/02/2015  . Alcohol abuse [F10.10] 03/08/2015  . Cocaine abuse [F14.10] 03/08/2015  . Substance induced mood disorder (HCC) [F19.94] 03/08/2015  . Suicidal ideation [R45.851]   . COPD (chronic obstructive pulmonary disease) (HCC) [J44.9] 02/09/2015  . Tobacco use disorder [F17.200] 02/09/2015  . Stimulant use disorder (cocaine) [F15.90] 02/09/2015  . Alcohol use disorder, severe, in sustained remission (HCC) [F10.21] 02/09/2015  . Ureteral stone [N20.1] 12/31/2014  . 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 CAD s/p CABG 2012 [facility] [I25.810] 11/28/2011  . Hyperlipidemia LDL goal <100 [E78.5] 03/13/2007  . Essential hypertension [I10] 03/13/2007  . GERD [K21.9] 03/13/2007   Total Time spent with patient: 25 minutes  Past Psychiatric History: see admission H and P  Past Medical History:  Past Medical History  Diagnosis Date  . Coronary artery disease   . Hypertension   . Hypercholesteremia   . Kidney stone   . Tobacco use disorder   . Hx  of CABG   . COPD (chronic obstructive pulmonary disease) (HCC)   . Bipolar disorder (manic depression) (HCC)   . Asthma     Past Surgical History  Procedure Laterality Date  . Coronary artery bypass graft    . Kidney stone surgery    . Appendectomy    . Lower extremity angiogram N/A 08/15/2012    Procedure: LOWER EXTREMITY ANGIOGRAM with possible PTA /stent;  Surgeon: Corky Crafts, MD;  Location: [facility] CATH LAB;  Service: Cardiovascular;  Laterality: N/A;  . Abdominal angiogram  08/15/2012    Procedure: ABDOMINAL ANGIOGRAM;  Surgeon: Corky Crafts, MD;  Location: [facility] CATH LAB;  Service: Cardiovascular;;  . Cardioversion N/A 08/28/2012    Procedure: Pseudo Compression;  Surgeon: Chuck Hint, MD;  Location: [facility] CATH LAB;  Service: Cardiovascular;  Laterality: N/A;  . Cystoscopy with retrograde pyelogram, ureteroscopy and stent placement Right 12/31/2014    Procedure: CYSTOSCOPY  RIGHT URETEROSCOPY WITH STONE RETREIVAL RIGHT RETROGRADE PYELOGRAM;  Surgeon: Malen Gauze, MD;  Location: [facility] ORS;  Service: Urology;  Laterality: Right;   Family History:  Family History  Problem Relation Age of Onset  . Heart disease Father   . Hyperlipidemia Father   . Heart disease Maternal Grandmother    Family Psychiatric  History: see admission H and P  Social History:  History  Alcohol Use  . Yes    Comment: last drink 03/07/2015      History  Drug Use  . Yes  . Special: Cocaine    Comment: 10/23//2016    Social  History   Social History  . Marital Status: Divorced    Spouse Name: N/A  . Number of Children: N/A  . Years of Education: N/A   Social History Main Topics  . Smoking status: Current Every Day Smoker -- 1.00 packs/day for 31 years    Types: Cigarettes  . Smokeless tobacco: Former NeurosurgeonUser    Quit date: 04/27/2013  . Alcohol Use: Yes     Comment: last drink 03/07/2015   . Drug Use: Yes    Special: Cocaine     Comment: 10/23//2016  . Sexual Activity: Yes     Birth Control/ Protection: Condom   Other Topics Concern  . None   Social History Narrative   Additional Social History:    Pain Medications: SEE MAR Prescriptions: SEE dMAR Over the Counter: SEE MAR Longest period of sobriety (when/how long): 18 months Apr 14 2013 to Mar 07 2015 Negative Consequences of Use: Financial, Legal, Personal relationships Withdrawal Symptoms: Other (Comment) (none currently) Name of Substance 1: Alcohol  1 - Age of First Use: 44 yrs old  1 - Amount (size/oz): 6 pack of beer 1 - Frequency: 1x use in 18 months (relapse) 1 - Duration: 1x use in 18 months 1 - Last Use / Amount: yesterday Name of Substance 2: Cocaine  2 - Age of First Use: 44 yrs old  2 - Amount (size/oz): "couple of lines" 2 - Frequency: 1x use in 18 months  2 - Duration: 1x use in the past 18 months  2 - Last Use / Amount: yesterday  Sleep: Fair  Appetite:  Fair  Current Medications: Current Facility-Administered Medications  Medication Dose Route Frequency Provider Last Rate Last Dose  . acetaminophen (TYLENOL) tablet 650 mg  650 mg Oral Q6H PRN Thermon LeylandLaura A Davis, NP      . albuterol (PROVENTIL HFA;VENTOLIN HFA) 108 (90 BASE) MCG/ACT inhaler 2 puff  2 puff Inhalation Q6H PRN Rachael FeeIrving A Jenessa Gillingham, MD      . alum & mag hydroxide-simeth (MAALOX/MYLANTA) 200-200-20 MG/5ML suspension 30 mL  30 mL Oral Q4H PRN Thermon LeylandLaura A Davis, NP      . clonazePAM Scarlette Calico(KLONOPIN) tablet 0.5 mg  0.5 mg Oral TID PRN Craige CottaFernando A Cobos, MD   0.5 mg at 04/10/15 0755  . FLUoxetine (PROZAC) capsule 20 mg  20 mg Oral Daily Thermon LeylandLaura A Davis, NP   20 mg at 04/10/15 [id number]  . haloperidol (HALDOL) tablet 0.5 mg  0.5 mg Oral TID Rachael FeeIrving A Anothony Bursch, MD   0.5 mg at 04/10/15 1201  . hydrOXYzine (ATARAX/VISTARIL) tablet 50 mg  50 mg Oral Q6H PRN Sanjuana KavaAgnes I Nwoko, NP      . ibuprofen (ADVIL,MOTRIN) tablet 600 mg  600 mg Oral Q8H PRN Charm RingsJamison Y Lord, NP   600 mg at 04/10/15 1202  . magnesium hydroxide (MILK OF MAGNESIA) suspension 30 mL  30 mL Oral Daily  PRN Thermon LeylandLaura A Davis, NP      . nicotine (NICODERM CQ - dosed in mg/24 hours) patch 21 mg  21 mg Transdermal Daily PRN Charm RingsJamison Y Lord, NP   21 mg at 04/10/15 0754  . ondansetron (ZOFRAN) tablet 4 mg  4 mg Oral Q8H PRN Charm RingsJamison Y Lord, NP      . pantoprazole (PROTONIX) EC tablet 40 mg  40 mg Oral Daily Kerry HoughSpencer E Simon, PA-C   40 mg at 04/10/15 [id number]  . QUEtiapine (SEROQUEL) tablet 400 mg  400 mg Oral QHS Sanjuana KavaAgnes I Nwoko, NP      .  QUEtiapine (SEROQUEL) tablet 50 mg  50 mg Oral TID Sanjuana Kava, NP        Lab Results:  No results found for this or any previous visit (from the past 48 hour(s)).  Physical Findings: AIMS: Facial and Oral Movements Muscles of Facial Expression: None, normal Lips and Perioral Area: None, normal Jaw: None, normal Tongue: None, normal,Extremity Movements Upper (arms, wrists, hands, fingers): None, normal Lower (legs, knees, ankles, toes): None, normal, Trunk Movements Neck, shoulders, hips: None, normal, Overall Severity Severity of abnormal movements (highest score from questions above): None, normal Incapacitation due to abnormal movements: None, normal Patient's awareness of abnormal movements (rate only patient's report): No Awareness, Dental Status Current problems with teeth and/or dentures?: Yes (pt sts he needs new dentures.  "they are worn out") Does patient usually wear dentures?: No  CIWA:  CIWA-Ar Total: 0 COWS:  COWS Total Score: 1  Musculoskeletal: Strength & Muscle Tone: within normal limits Gait & Station: normal Patient leans: normal  Psychiatric Specialty Exam: Review of Systems  Constitutional: Positive for malaise/fatigue.  Respiratory:       Pack a day  Cardiovascular: Negative for chest pain.  Gastrointestinal: Positive for heartburn.  Musculoskeletal: Positive for joint pain.  Skin: Negative for rash.  Neurological: Negative for tremors.  Psychiatric/Behavioral: Positive for substance abuse. The patient has insomnia.     Blood  pressure 73/55, pulse 70, temperature 97.9 F (36.6 C), temperature source Oral, resp. rate 17, height  (1.778 m), weight 94.802 kg (209 lb), SpO2 97 %.Body mass index is 29.99 kg/(m^2).  General Appearance: Disheveled  Eye Solicitor::  Fair  Speech:  Clear and Coherent  Volume:  fluctuates  Mood:  dysphoric  Affect:  Restricted  Thought Process:  Coherent and Goal Directed  Orientation:  Full (Time, Place, and Person)  Thought Content:  symptoms events worries concerns  Suicidal Thoughts:  thoughts "they are always there"  Homicidal Thoughts:  No  Memory:  Immediate;   Fair Recent;   Fair Remote;   Fair  Judgement:  Fair  Insight:  Present and Shallow  Psychomotor Activity:  Normal  Concentration:  Fair  Recall:  [employer] of Knowledge:Fair  Language: Fair  Akathisia:  No  Handed:  Right  AIMS (if indicated):     Assets:  Desire for Improvement  ADL's:  Intact  Cognition: WNL  Sleep:  Number of Hours: 3.25   Treatment Plan Summary: Daily contact with patient to assess and evaluate symptoms and progress in treatment and Medication management Supportive approach/coping skills Substance abuse-dependence; work a relapse prevention plan Depression; continue the Prozac 20 mg Mood instability/insomnia; increased Seroquel to 400 mg Q hs Anxiety-agitation; increased Seroquel Seroquel to 50 mg tid, he says it is helping some, Haldol was started 3 days ago, small dose tid. Some improvement in agitation. Hydroxyzine increased to 50 mg tid prn..  Explore placement options and possible discharge plan within the next day or two. Social services to look into placement options.  Drug referrals for substance abuse.   Sanjuana Kava, PMHNP, [name]-BC 04/10/2015, 3:56 PM I agree with assessment and plan Madie Reno A. Dub Mikes, M.D.

## 2015-04-10 NOTE — [facility] Group Notes (Signed)
   03/13/2015     1:15-2:15PM  Summary of Progress/Problems:   In today's process group a decisional balance exercise was used to explore in depth the perceived benefits and costs of using the unhealthy coping techniques identified by each patient as one they often use, as well as the  benefits and costs of replacing these with healthy coping skills.  Four patients were very disruptive throughout group and very distracting both to the group process and to staying on task to talk about any meaningful feelings.  CSW addressed this repeatedly and redirected these individuals, but ultimately ended group early with an apology that it was not more productive.  The patient expressed that drinking is an unhealthy coping mechanism he uses.  He did not really pay attention during group, and even when we tried one time to return to the group topic after one of the many disruptions, he started another disruptive interlude by talking about a patient's odor and laughing, which caused others to start again.   Type of Therapy:  Group Therapy - Process   Participation Level:  Minimal  Participation Quality: Inattentivce  Affect:  Blunted   Cognitive:  Alert  Insight:  Limited  Engagement in Therapy:  Limited  Modes of Intervention:  Education, Motivational Interviewing  Ambrose MantleMareida Grossman-Orr, LCSW 04/10/2015, 2:40 PM

## 2015-04-10 NOTE — Progress Notes (Signed)
D: Pt is alert and oriented x4. Pt with flat affect reports moderate anxiety and depression. Pt also endorses mild L. elbow pain. He states, "My elbow always hurt, but it's nothing I can't handle." Pt however, denies SI/HI, AVH. Pt remained calm and cooperative through the shift assessment.  A: Medications offered as prescribed.  Support, encouragement, and safe environment provided.  15-minute safety checks continue.  R: Pt was med compliant. Safety checks continue.

## 2015-04-10 NOTE — Progress Notes (Signed)
D Fredrik CoveRoger has had an " okay" day.Marland Kitchen.He is very depressed. This is evidenced by how slow he moves. How his tone of voice is so flat. How he shows no emotion in his facial expression and / or when he expresses himself. He takes his medicaitons as planned. He completed his daily assessment and on it he wrote he has had  SI  Today ( he verbally contracts for safety with this nurse() and he rated his  Depression, hopelessness and anxiety " 02/20/09", respectively.   A He asked this nurse to awaken him for his Life SKills group and he attended when Clinical research associatewriter did as such. He was engaged in the group discussion as evidenced by him staying awake and attentive through the course of the class. He has asked for multiple prns for his high anxiety level and has stated result received.   R Safety is in place,..Marland Kitchen

## 2015-04-11 MED ORDER — HYDROCORTISONE 0.5 % EX CREA
TOPICAL_CREAM | Freq: Two times a day (BID) | CUTANEOUS | Status: AC
Start: 2015-04-11 — End: 2015-04-13
  Administered 2015-04-11: 17:00:00 via TOPICAL
  Administered 2015-04-12: 1 via TOPICAL
  Administered 2015-04-12 – 2015-04-13 (×2): via TOPICAL
  Filled 2015-04-11 (×2): qty 28.35

## 2015-04-11 NOTE — Progress Notes (Signed)
Patient ID: Steven [name], male   DOB: 06/24/1970, 44 y.o.   MRN: [id number] [facility] MD Progress Note  04/11/2015 2:48 PM Steven [name]  MRN:  [id number] Subjective:    Patient states "I am doing some better. Not as depressed. My depression is down to a seven. Still have some passive SI but not as intense. I just notice that my mood is still shifting. Like I was so depressed this morning but now I am kind of happy. I don't have time to wait for Disability. I have got to get a job when I leave here soon."  Objective: Charleton is alert, attending groups and  tolerating medications, Seroquel was increased yesterday to 400 mg q hs & 50 mg tid for anxiety with addition of haldol as it helped before. Slight improvement. No delusions. Mood not agitated.Reports that he is sleeping better at night. Still worried about where he will stay after discharge. Steven [name] appears to be making good progress in his treatment. Compared to his presentation while in the [facility]Observ[facility] the patient appears much less depressed, is well groomed and talking about his future plans rather than ruminating about committing suicide.   Principal Problem: Bipolar 1 disorder, depressed (HCC) Diagnosis:   Patient Active Problem List   Diagnosis Date Noted  . Cocaine use disorder, moderate, dependence (HCC) [F14.20]   . Alcohol use disorder, moderate, dependence (HCC) [F10.20] 04/02/2015  . Bipolar 1 disorder, depressed (HCC) [F31.9] 04/02/2015  . Alcohol abuse [F10.10] 03/08/2015  . Cocaine abuse [F14.10] 03/08/2015  . Substance induced mood disorder (HCC) [F19.94] 03/08/2015  . Suicidal ideation [R45.851]   . COPD (chronic obstructive pulmonary disease) (HCC) [J44.9] 02/09/2015  . Tobacco use disorder [F17.200] 02/09/2015  . Stimulant use disorder (cocaine) [F15.90] 02/09/2015  . Alcohol use disorder, severe, in sustained remission (HCC) [F10.21] 02/09/2015  . Ureteral stone [N20.1] 12/31/2014  . Claudication of left lower extremity  (HCC) [I73.9] 08/14/2012  . CAD s/p CABG 2012 [facility] [I25.810] 11/28/2011  . Hyperlipidemia LDL goal <100 [E78.5] 03/13/2007  . Essential hypertension [I10] 03/13/2007  . GERD [K21.9] 03/13/2007   Total Time spent with patient: 20 minutes  Past Psychiatric History: see admission H and P  Past Medical History:  Past Medical History  Diagnosis Date  . Coronary artery disease   . Hypertension   . Hypercholesteremia   . Kidney stone   . Tobacco use disorder   . Hx of CABG   . COPD (chronic obstructive pulmonary disease) (HCC)   . Bipolar disorder (manic depression) (HCC)   . Asthma     Past Surgical History  Procedure Laterality Date  . Coronary artery bypass graft    . Kidney stone surgery    . Appendectomy    . Lower extremity angiogram N/A 08/15/2012    Procedure: LOWER EXTREMITY ANGIOGRAM with possible PTA /stent;  Surgeon: Corky Crafts, MD;  Location: [facility] CATH LAB;  Service: Cardiovascular;  Laterality: N/A;  . Abdominal angiogram  08/15/2012    Procedure: ABDOMINAL ANGIOGRAM;  Surgeon: Corky Crafts, MD;  Location: [facility] CATH LAB;  Service: Cardiovascular;;  . Cardioversion N/A 08/28/2012    Procedure: Pseudo Compression;  Surgeon: Chuck Hint, MD;  Location: [facility] CATH LAB;  Service: Cardiovascular;  Laterality: N/A;  . Cystoscopy with retrograde pyelogram, ureteroscopy and stent placement Right 12/31/2014    Procedure: CYSTOSCOPY  RIGHT URETEROSCOPY WITH STONE RETREIVAL RIGHT RETROGRADE PYELOGRAM;  Surgeon: Malen Gauze, MD;  Location: [facility] ORS;  Service:  Urology;  Laterality: Right;   Family History:  Family History  Problem Relation Age of Onset  . Heart disease Father   . Hyperlipidemia Father   . Heart disease Maternal Grandmother    Family Psychiatric  History: see admission H and P  Social History:  History  Alcohol Use  . Yes    Comment: last drink 03/07/2015      History  Drug Use  . Yes  . Special: Cocaine    Comment:  10/23//2016    Social History   Social History  . Marital Status: Divorced    Spouse Name: N/A  . Number of Children: N/A  . Years of Education: N/A   Social History Main Topics  . Smoking status: Current Every Day Smoker -- 1.00 packs/day for 31 years    Types: Cigarettes  . Smokeless tobacco: Former NeurosurgeonUser    Quit date: 04/27/2013  . Alcohol Use: Yes     Comment: last drink 03/07/2015   . Drug Use: Yes    Special: Cocaine     Comment: 10/23//2016  . Sexual Activity: Yes    Birth Control/ Protection: Condom   Other Topics Concern  . None   Social History Narrative   Additional Social History:    Pain Medications: SEE MAR Prescriptions: SEE dMAR Over the Counter: SEE MAR Longest period of sobriety (when/how long): 18 months Apr 14 2013 to Mar 07 2015 Negative Consequences of Use: Financial, Legal, Personal relationships Withdrawal Symptoms: Other (Comment) (none currently) Name of Substance 1: Alcohol  1 - Age of First Use: 44 yrs old  1 - Amount (size/oz): 6 pack of beer 1 - Frequency: 1x use in 18 months (relapse) 1 - Duration: 1x use in 18 months 1 - Last Use / Amount: yesterday Name of Substance 2: Cocaine  2 - Age of First Use: 44 yrs old  2 - Amount (size/oz): "couple of lines" 2 - Frequency: 1x use in 18 months  2 - Duration: 1x use in the past 18 months  2 - Last Use / Amount: yesterday  Sleep: Fair  Appetite:  Good  Current Medications: Current Facility-Administered Medications  Medication Dose Route Frequency Provider Last Rate Last Dose  . acetaminophen (TYLENOL) tablet 650 mg  650 mg Oral Q6H PRN Thermon LeylandLaura A Davis, NP      . albuterol (PROVENTIL HFA;VENTOLIN HFA) 108 (90 BASE) MCG/ACT inhaler 2 puff  2 puff Inhalation Q6H PRN Rachael FeeIrving A Londyn Wotton, MD      . alum & mag hydroxide-simeth (MAALOX/MYLANTA) 200-200-20 MG/5ML suspension 30 mL  30 mL Oral Q4H PRN Thermon LeylandLaura A Davis, NP      . clonazePAM Scarlette Calico(KLONOPIN) tablet 0.5 mg  0.5 mg Oral TID PRN Craige CottaFernando A Cobos, MD    0.5 mg at 04/11/15 1421  . FLUoxetine (PROZAC) capsule 20 mg  20 mg Oral Daily Thermon LeylandLaura A Davis, NP   20 mg at 04/11/15 0745  . haloperidol (HALDOL) tablet 0.5 mg  0.5 mg Oral TID Rachael FeeIrving A Serrena Linderman, MD   0.5 mg at 04/11/15 1209  . hydrocortisone cream 0.5 %   Topical BID Thermon LeylandLaura A Davis, NP      . hydrOXYzine (ATARAX/VISTARIL) tablet 50 mg  50 mg Oral Q6H PRN Sanjuana KavaAgnes I Nwoko, NP   50 mg at 04/11/15 0748  . ibuprofen (ADVIL,MOTRIN) tablet 600 mg  600 mg Oral Q8H PRN Charm RingsJamison Y Lord, NP   600 mg at 04/10/15 2120  . magnesium hydroxide (MILK OF MAGNESIA) suspension 30  mL  30 mL Oral Daily PRN Thermon Leyland, NP      . nicotine (NICODERM CQ - dosed in mg/24 hours) patch 21 mg  21 mg Transdermal Daily PRN Charm Rings, NP   21 mg at 04/10/15 0754  . ondansetron (ZOFRAN) tablet 4 mg  4 mg Oral Q8H PRN Charm Rings, NP      . pantoprazole (PROTONIX) EC tablet 40 mg  40 mg Oral Daily Kerry Hough, PA-C   40 mg at 04/11/15 0745  . QUEtiapine (SEROQUEL) tablet 400 mg  400 mg Oral QHS Sanjuana Kava, NP   400 mg at 04/10/15 2119  . QUEtiapine (SEROQUEL) tablet 50 mg  50 mg Oral TID Sanjuana Kava, NP   50 mg at 04/11/15 1209    Lab Results:  No results found for this or any previous visit (from the past 48 hour(s)).  Physical Findings: AIMS: Facial and Oral Movements Muscles of Facial Expression: None, normal Lips and Perioral Area: None, normal Jaw: None, normal Tongue: None, normal,Extremity Movements Upper (arms, wrists, hands, fingers): None, normal Lower (legs, knees, ankles, toes): None, normal, Trunk Movements Neck, shoulders, hips: None, normal, Overall Severity Severity of abnormal movements (highest score from questions above): None, normal Incapacitation due to abnormal movements: None, normal Patient's awareness of abnormal movements (rate only patient's report): No Awareness, Dental Status Current problems with teeth and/or dentures?: Yes (pt sts he needs new dentures.  "they are worn  out") Does patient usually wear dentures?: No  CIWA:  CIWA-Ar Total: 0 COWS:  COWS Total Score: 1  Musculoskeletal: Strength & Muscle Tone: within normal limits Gait & Station: normal Patient leans: normal  Psychiatric Specialty Exam: Review of Systems  Constitutional: Negative.   HENT: Negative.   Eyes: Negative.   Respiratory: Negative.        Pack a day  Cardiovascular: Negative.   Gastrointestinal: Negative.   Genitourinary: Negative.   Musculoskeletal: Positive for joint pain.  Skin:       Patient has new reddened area around his nose that appears to be contact dermatitis as he denies any chronic inflammatory skin disorders.   Neurological: Negative.   Endo/Heme/Allergies: Negative.   Psychiatric/Behavioral: Positive for depression, suicidal ideas and substance abuse (Positive for cocaine on admission ). Negative for hallucinations and memory loss. The patient is nervous/anxious and has insomnia.     Blood pressure 117/67, pulse 93, temperature 97.9 F (36.6 C), temperature source Oral, resp. rate 18, height 5\' 10"  (1.778 m), weight 94.802 kg (209 lb), SpO2 97 %.Body mass index is 29.99 kg/(m^2).  General Appearance: Neat and Well Groomed  Patent attorney::  Good  Speech:  Clear and Coherent  Volume:  fluctuates  Mood:  Anxious  Affect:  Full Range  Thought Process:  Coherent and Goal Directed  Orientation:  Full (Time, Place, and Person)  Thought Content:  symptoms events worries concerns  Suicidal Thoughts:  Yes.  with intent/plan  Homicidal Thoughts:  No  Memory:  Immediate;   Fair Recent;   Fair Remote;   Fair  Judgement:  Fair  Insight:  Present and Shallow  Psychomotor Activity:  Normal  Concentration:  Fair  Recall:  [employer] of Knowledge:Fair  Language: Fair  Akathisia:  No  Handed:  Right  AIMS (if indicated):     Assets:  Communication Skills Desire for Improvement Leisure Time Resilience  ADL's:  Intact  Cognition: WNL  Sleep:  Number of Hours:  5  Treatment Plan Summary: Daily contact with patient to assess and evaluate symptoms and progress in treatment and Medication management Supportive approach/coping skills Substance abuse-dependence; work a relapse prevention plan Depression; continue the Prozac 20 mg Mood instability/insomnia; continue Seroquel to 400 mg Q hs Anxiety-agitation; Continue Seroquel to 50 mg tid, he says it is helping some, Haldol was started 3 days ago, small dose tid. Some improvement in agitation. Hydroxyzine continued to 50 mg tid prn..  Explore placement options and possible discharge plan within the next day or two. Social services to look into placement options.  Drug referrals for substance abuse.  Start low dose hydrocortisone cream 0.5 % for facial irritation likely related to exposure to irritant from soaps  DAVIS, LAURA, NP-C 04/11/2015, 2:48 PM I agree with assessment and plan Madie Reno A. Dub Mikes, M.D.

## 2015-04-11 NOTE — Progress Notes (Signed)
Steven [name] is about the same today. He got up first thing this morning and took his morning meds. HE takes his scheduled meds as planned  . He completed his morning assessment and on it he wrote  He has had SI today ( he easily contracted for safety with this Clinical research associatewriter) and he rated his depression, hopelessness and anxiety " 01/21/09" , respectively.   A He attended his groups as planned. He is encouraged to work on his unhealthy behaviors as well as developing hias supoport systems.   R Safety is in place and poc cont.

## 2015-04-11 NOTE — [facility] Group Notes (Signed)
[facility] Group Notes:  (Clinical Social Work)   04/11/2015 1:15-2:15PM  Summary of Progress/Problems:   The main focus of today's process group was to   1)  discuss the importance of adding supports  2)  define health supports versus unhealthy supports  3)  identify the patient's current unhealthy supports and plan how to handle them  4)  Identify the patient's current healthy supports and plan what to add.  An emphasis was placed on using counselor, doctor, therapy groups, 12-step groups, and problem-specific support groups to expand supports.    The patient expressed full comprehension of the concepts presented, and agreed that there is a need to add more supports.  The patient stated little during group.  He suggested early on that a group rule should be "no cursing" and a number of other patients became angry at CSW's agreement to this.  He was able to sit quietly and not get involved in the discussion additionally, however.    Type of Therapy:  Process Group with Motivational Interviewing  Participation Level:  Active  Participation Quality:  Attentive  Affect:  Blunted  Cognitive:  Appropriate  Insight:  Engaged  Engagement in Therapy:  Engaged  Modes of Intervention:   Education, Support and Processing, Activity  Ambrose MantleMareida Grossman-Orr, LCSW 04/11/2015   5:09 PM

## 2015-04-11 NOTE — Progress Notes (Signed)
Writer spoke with patient 1:1 and he reports having had an ok day. He has been isolative to his room, did not attend group and came out for snacks after group. He denies si/hi/a/v hallucinations. Support given, safety maintained on unit with 15 min checks.

## 2015-04-11 NOTE — [facility] Group Notes (Signed)
[facility] Group Notes:  (Nursing/MHT/Case Management/Adjunct)  Date:  04/11/2015  Time:  11:12 AM  Type of Therapy:  Psychoeducational Skills  Participation Level:  Active  Participation Quality:  Appropriate  Affect:  Appropriate  Cognitive:  Appropriate  Insight:  Appropriate  Engagement in Group:  Engaged  Modes of Intervention:  Discussion  Summary of Progress/Problems: Pt did attend self inventory group.  Steven [name] 04/11/2015, 11:12 AM 

## 2015-04-11 NOTE — Progress Notes (Signed)
Writer spoke with patient 1:1 and he had been in bed asleep and got up after group and requested his hs medications after eating a snack. Patient has been asleep for at least 2-3 hours. He reports that he felt a little better today but has still had suicidal thoughts some today.  He denies hi/a/v hallucinations. He has little to no interaction with peers on the unit and seems to be consumed with what medications he can receive. Encouraged him to try and not take naps during the day so that he will be able to sleep at night. Support given and safety maintained on unit with 15 min checks.

## 2015-04-12 MED ORDER — CLONAZEPAM 0.5 MG PO TABS
0.5000 mg | ORAL_TABLET | Freq: Two times a day (BID) | ORAL | Status: DC | PRN
Start: 2015-04-12 — End: 2015-04-13
  Administered 2015-04-12 – 2015-04-13 (×3): 0.5 mg via ORAL
  Filled 2015-04-12 (×3): qty 1

## 2015-04-12 MED ORDER — ZOLPIDEM TARTRATE 5 MG PO TABS
5.0000 mg | ORAL_TABLET | Freq: Every evening | ORAL | Status: DC | PRN
  Administered 2015-04-12: 5 mg via ORAL
  Filled 2015-04-12 (×2): qty 1

## 2015-04-12 MED ORDER — METFORMIN HCL 500 MG PO TABS
500.0000 mg | ORAL_TABLET | Freq: Every day | ORAL | Status: DC
  Administered 2015-04-13: 500 mg via ORAL
  Filled 2015-04-12: qty 7
  Filled 2015-04-12 (×2): qty 1

## 2015-04-12 MED ORDER — LURASIDONE HCL 40 MG PO TABS
40.0000 mg | ORAL_TABLET | Freq: Every day | ORAL | Status: DC
  Administered 2015-04-13: 40 mg via ORAL
  Filled 2015-04-12 (×2): qty 1
  Filled 2015-04-12: qty 7

## 2015-04-12 MED ORDER — ATORVASTATIN CALCIUM 20 MG PO TABS
20.0000 mg | ORAL_TABLET | Freq: Every day | ORAL | Status: DC
  Administered 2015-04-12: 20 mg via ORAL
  Filled 2015-04-12: qty 1
  Filled 2015-04-12: qty 7
  Filled 2015-04-12: qty 1

## 2015-04-12 MED ORDER — ZOLPIDEM TARTRATE 5 MG PO TABS
5.0000 mg | ORAL_TABLET | Freq: Once | ORAL | Status: AC
  Administered 2015-04-12: 5 mg via ORAL

## 2015-04-12 NOTE — Progress Notes (Signed)
Patient ID: PERNELL LENOIR, male   DOB: 09/10/1970, 44 y.o.   MRN: [id number] [facility] MD Progress Note  04/12/2015 1:45 PM TRYSTEN BERNARD  MRN:  [id number] Subjective:   Patient reports he is feeling better overall, still somewhat depressed, but better than on admission . He is denying any current suicidal ideations, and at this time he is more future oriented. Denies medication side effects, but does express concern about Seroquel causing increased appetite / possible weight gain. States he had been on Latuda in the past, and feels it worked " all right" and was not associated with weight gain.   Objective:  I have discussed case with treatment team and met with patient. Report is that patient has improved partially but is still reporting depression. Chart  Notes indicate that patient has reported ongoing suicidal ruminations- at this time denies any plan or intention of SI and contracts for safety, does state he had been having some passive thoughts of SI, but these have improved as he starts feeling better.  He is less dysphoric and less irritable than on admission- he acknowledges partial improvement, and as noted , at this time is more future oriented, and focusing on disposition options. Labs reviewed - hypercholesterolemia, hypertriglyceridemia, elevated HgbA1C   No disruptive  Behaviors on unit, some group participation.   Principal Problem: Bipolar 1 disorder, depressed ([place]) Diagnosis:   Patient Active Problem List   Diagnosis Date Noted  . Cocaine use disorder, moderate, dependence ([place]) [F14.20]   . Alcohol use disorder, moderate, dependence ([place]) [F10.20] 04/02/2015  . Bipolar 1 disorder, depressed ([place]) [F31.9] 04/02/2015  . Alcohol abuse [F10.10] 03/08/2015  . Cocaine abuse [F14.10] 03/08/2015  . Substance induced mood disorder ([place]) [F19.94] 03/08/2015  . Suicidal ideation [R45.851]   . COPD (chronic obstructive pulmonary disease) ([place]) [J44.9] 02/09/2015  . Tobacco use disorder  [F17.200] 02/09/2015  . Stimulant use disorder (cocaine) [F15.90] 02/09/2015  . Alcohol use disorder, severe, in sustained remission ([place]) [F10.21] 02/09/2015  . Ureteral stone [N20.1] 12/31/2014  . Claudication of left lower extremity ([place]) [I73.9] 08/14/2012  . CAD s/p CABG 2012 [facility] [I25.810] 11/28/2011  . Hyperlipidemia LDL goal <100 [E78.5] 03/13/2007  . Essential hypertension [I10] 03/13/2007  . GERD [K21.9] 03/13/2007   Total Time spent with patient: 20 minutes  Past Psychiatric History: see admission H and P  Past Medical History:  Past Medical History  Diagnosis Date  . Coronary artery disease   . Hypertension   . Hypercholesteremia   . Kidney stone   . Tobacco use disorder   . Hx of CABG   . COPD (chronic obstructive pulmonary disease) ([place])   . Bipolar disorder (manic depression) ([place])   . Asthma     Past Surgical History  Procedure Laterality Date  . Coronary artery bypass graft    . Kidney stone surgery    . Appendectomy    . Lower extremity angiogram N/A 08/15/2012    Procedure: LOWER EXTREMITY ANGIOGRAM with possible PTA /stent;  Surgeon: Jettie Booze, MD;  Location: [facility] CATH LAB;  Service: Cardiovascular;  Laterality: N/A;  . Abdominal angiogram  08/15/2012    Procedure: ABDOMINAL ANGIOGRAM;  Surgeon: Jettie Booze, MD;  Location: [facility] CATH LAB;  Service: Cardiovascular;;  . Cardioversion N/A 08/28/2012    Procedure: Pseudo Compression;  Surgeon: Angelia Mould, MD;  Location: [facility] CATH LAB;  Service: Cardiovascular;  Laterality: N/A;  . Cystoscopy with retrograde pyelogram, ureteroscopy and stent placement Right 12/31/2014  Procedure: CYSTOSCOPY  RIGHT URETEROSCOPY WITH STONE RETREIVAL RIGHT RETROGRADE PYELOGRAM;  Surgeon: Cleon Gustin, MD;  Location: [facility] ORS;  Service: Urology;  Laterality: Right;   Family History:  Family History  Problem Relation Age of Onset  . Heart disease Father   . Hyperlipidemia Father   . Heart  disease Maternal Grandmother    Family Psychiatric  History: see admission H and P  Social History:  History  Alcohol Use  . Yes    Comment: last drink 03/07/2015      History  Drug Use  . Yes  . Special: Cocaine    Comment: 10/23//2016    Social History   Social History  . Marital Status: Divorced    Spouse Name: N/A  . Number of Children: N/A  . Years of Education: N/A   Social History Main Topics  . Smoking status: Current Every Day Smoker -- 1.00 packs/day for 31 years    Types: Cigarettes  . Smokeless tobacco: Former Systems developer    Quit date: 04/27/2013  . Alcohol Use: Yes     Comment: last drink 03/07/2015   . Drug Use: Yes    Special: Cocaine     Comment: 10/23//2016  . Sexual Activity: Yes    Birth Control/ Protection: Condom   Other Topics Concern  . None   Social History Narrative   Additional Social History:    Pain Medications: SEE MAR Prescriptions: SEE dMAR Over the Counter: SEE MAR Longest period of sobriety (when/how long): 18 months Apr 14 2013 to Mar 07 2015 Negative Consequences of Use: Financial, Legal, Personal relationships Withdrawal Symptoms: Other (Comment) (none currently) Name of Substance 1: Alcohol  1 - Age of First Use: 44 yrs old  1 - Amount (size/oz): 6 pack of beer 1 - Frequency: 1x use in 18 months (relapse) 1 - Duration: 1x use in 18 months 1 - Last Use / Amount: yesterday Name of Substance 2: Cocaine  2 - Age of First Use: 44 yrs old  2 - Amount (size/oz): "couple of lines" 2 - Frequency: 1x use in 18 months  2 - Duration: 1x use in the past 18 months  2 - Last Use / Amount: yesterday  Sleep: Fair  Appetite:  Good  Current Medications: Current Facility-Administered Medications  Medication Dose Route Frequency Provider Last Rate Last Dose  . acetaminophen (TYLENOL) tablet 650 mg  650 mg Oral Q6H PRN Niel Hummer, NP      . albuterol (PROVENTIL HFA;VENTOLIN HFA) 108 (90 BASE) MCG/ACT inhaler 2 puff  2 puff Inhalation  Q6H PRN Nicholaus Bloom, MD      . alum & mag hydroxide-simeth (MAALOX/MYLANTA) 200-200-20 MG/5ML suspension 30 mL  30 mL Oral Q4H PRN Niel Hummer, NP      . clonazePAM Bobbye [place]) tablet 0.5 mg  0.5 mg Oral TID PRN Jenne Campus, MD   0.5 mg at 04/12/15 2778  . FLUoxetine (PROZAC) capsule 20 mg  20 mg Oral Daily Niel Hummer, NP   20 mg at 04/12/15 0730  . haloperidol (HALDOL) tablet 0.5 mg  0.5 mg Oral TID Nicholaus Bloom, MD   0.5 mg at 04/12/15 1208  . hydrocortisone cream 0.5 %   Topical BID Niel Hummer, NP   1 application at 24/23/53 6144  . hydrOXYzine (ATARAX/VISTARIL) tablet 50 mg  50 mg Oral Q6H PRN Encarnacion Slates, NP   50 mg at 04/12/15 0855  . ibuprofen (ADVIL,MOTRIN) tablet 600 mg  600 mg Oral Q8H PRN Patrecia Pour, NP   600 mg at 04/10/15 2120  . magnesium hydroxide (MILK OF MAGNESIA) suspension 30 mL  30 mL Oral Daily PRN Niel Hummer, NP      . nicotine (NICODERM CQ - dosed in mg/24 hours) patch 21 mg  21 mg Transdermal Daily PRN Patrecia Pour, NP   21 mg at 04/12/15 0741  . ondansetron (ZOFRAN) tablet 4 mg  4 mg Oral Q8H PRN Patrecia Pour, NP      . pantoprazole (PROTONIX) EC tablet 40 mg  40 mg Oral Daily Laverle Hobby, PA-C   40 mg at 04/12/15 0730  . QUEtiapine (SEROQUEL) tablet 400 mg  400 mg Oral QHS Encarnacion Slates, NP   400 mg at 04/11/15 2056  . QUEtiapine (SEROQUEL) tablet 50 mg  50 mg Oral TID Encarnacion Slates, NP   50 mg at 04/12/15 1208    Lab Results:  No results found for this or any previous visit (from the past 48 hour(s)).  Physical Findings: AIMS: Facial and Oral Movements Muscles of Facial Expression: None, normal Lips and Perioral Area: None, normal Jaw: None, normal Tongue: None, normal,Extremity Movements Upper (arms, wrists, hands, fingers): None, normal Lower (legs, knees, ankles, toes): None, normal, Trunk Movements Neck, shoulders, hips: None, normal, Overall Severity Severity of abnormal movements (highest score from questions above): None,  normal Incapacitation due to abnormal movements: None, normal Patient's awareness of abnormal movements (rate only patient's report): No Awareness, Dental Status Current problems with teeth and/or dentures?: No Does patient usually wear dentures?: No  CIWA:  CIWA-Ar Total: 0 COWS:  COWS Total Score: 1  Musculoskeletal: Strength & Muscle Tone: within normal limits Gait & Station: normal Patient leans: normal  Psychiatric Specialty Exam: Review of Systems  Constitutional: Negative.   HENT: Negative.   Eyes: Negative.   Respiratory: Negative.        Pack a day  Cardiovascular: Negative.   Gastrointestinal: Negative.   Genitourinary: Negative.   Musculoskeletal: Positive for joint pain.  Skin:       Patient has new reddened area around his nose that appears to be contact dermatitis as he denies any chronic inflammatory skin disorders.   Neurological: Negative.   Endo/Heme/Allergies: Negative.   Psychiatric/Behavioral: Positive for depression, suicidal ideas and substance abuse (Positive for cocaine on admission ). Negative for hallucinations and memory loss. The patient is nervous/anxious and has insomnia.     Blood pressure 77/51, pulse 99, temperature 97.4 F (36.3 C), temperature source Oral, resp. rate 18, height $RemoveBe'5\' 10"'tygAvttlX$  (1.778 m), weight 209 lb (94.802 kg), SpO2 97 %.Body mass index is 29.99 kg/(m^2).  General Appearance: improved grooming  Eye Contact::  Improved   Speech:  Clear and Coherent  Volume:   normal  Mood:   Partially improved mood, less depressed   Affect:   Anxious, but reactive   Thought Process:  Coherent and Goal Directed  Orientation:  Full (Time, Place, and Person)  Thought Content:  Denies hallucinations, no delusions   Suicidal Thoughts: at this time denies suicidal ideations, plan or intention and contracts for safety on the unit   Homicidal Thoughts:  No  Memory:  Immediate;   Fair Recent;   Fair Remote;   Fair  Judgement:  Fair  Insight:  Present  and Shallow  Psychomotor Activity:  Normal  Concentration:  Good  Recall:  Good  Fund of Knowledge:Good  Language: Good  Akathisia:  No  Handed:  Right  AIMS (if indicated):     Assets:  Communication Skills Desire for Improvement Leisure Time Resilience  ADL's:  Intact  Cognition: WNL  Sleep:  Number of Hours: 4.5  Assessment - partial improvement of mood, affect . Still reporting anxiety, depression but acknowledging improvement. At present denying any SI, and is clearly more future oriented, discussing disposition options. Of note, labs indicate dyslipidemia, and possibly DM. Hgb A1C levels have increased compared to prior, and BMI is 30. As per above, we reviewed Seroquel trial, which patient does think is resulting in increased appetite/food conumption. He states he has responded to [place] in the past and is agreeing to restarting this medication .  Treatment Plan Summary: Daily contact with patient to assess and evaluate symptoms and progress in treatment and Medication management Supportive approach/coping skills Substance abuse-dependence;  Encourage ongoing efforts to maintain sobriety, relapse prevention Depression; continue the Prozac 20 mg Mood instability/insomnia; start Latuda 40 mgrs QDAY  D/C Seroquel - due to concerns it is contributing to metabolic/ glycemic abnormalities  D/C  Haldol- not currently psychotic, no clear indication at this time.  Anxiety; continue Hydroxyzine continued to 50 mg TID PRN  Treatment Team working on disposition planning. Requested Hospitalist consult to review need for hypoglycemic agent , if indicated Requested Dietary Consult    Neita Garnet,  MD  04/12/2015, 1:45 PM

## 2015-04-12 NOTE — [facility] Group Notes (Signed)
[facility] LCSW Group Therapy  04/12/2015 1:15pm  Type of Therapy:  Group Therapy vercoming Obstacles  Participation Level:  Minimal  Participation Quality:  Reserved  Affect:  Flat  Cognitive:  Appropriate and Oriented  Insight:  Developing/Improving and Improving  Engagement in Therapy:  Limited  Modes of Intervention:  Discussion, Exploration, Problem-solving and Support  Description of Group:   In this group patients will be encouraged to explore what they see as obstacles to their own wellness and recovery. They will be guided to discuss their thoughts, feelings, and behaviors related to these obstacles. The group will process together ways to cope with barriers, with attention given to specific choices patients can make. Each patient will be challenged to identify changes they are motivated to make in order to overcome their obstacles. This group will be process-oriented, with patients participating in exploration of their own experiences as well as giving and receiving support and challenge from other group members.  Summary of Patient Progress: Pt attended group but did not participate verbally.   Therapeutic Modalities:   Cognitive Behavioral Therapy Solution Focused Therapy Motivational Interviewing Relapse Prevention Therapy   Chad CordialLauren Carter, LCSWA 04/12/2015 3:35 PM

## 2015-04-12 NOTE — Consult Note (Addendum)
[facility] Medical Consultation  Steven ChangRoger L [name] WUJ:[id number] DOB: 05/07/1971 DOA: 04/02/2015 PCP: Dartha LodgeSTEELE,[name], [name]   Requesting physician: Dr. Jama Flavorsobos  Date of consultation: 11/28 Reason for consultation: new onset DM and HLD  Impression/Recommendations Principal Problem:   Bipolar 1 disorder, depressed (H[facility]) - per primary team  Active Problems:   Alcohol use disorder, moderate, dependence (H[facility])   Cocaine use disorder, moderate, dependence ([facility]) - cessation consultation provided    New onset mild DM - add Metformin 500 mg PO QD - no need for daily monitoring upon discharge but needs repeat A1C in 3 months    New onset HLD - place on Atorvastatin   I will followup again tomorrow. Please contact me if I can be of assistance in the meanwhile. Thank you for this consultation.  Chief Complaint: DM and HLD management   HPI:  Pt is 44 yo male, very pleasant[facility], now with HLD and early stage DM. TRH asked to recommend medical regimen. Pt denies chest pain or shortness of breath, no abd or urinary concerns.   Review of Systems:  Constitutional: Negative for fever, chills, diaphoresis, activity change, appetite change and fatigue.  HENT: Negative for ear pain, nosebleeds, congestion, facial swelling, rhinorrhea, neck pain, neck stiffness and ear discharge.   Eyes: Negative for pain, discharge, redness, itching and visual disturbance.  Respiratory: Negative for cough, choking, chest tightness, shortness of breath, wheezing and stridor.   Cardiovascular: Negative for chest pain, palpitations and leg swelling.  Gastrointestinal: Negative for abdominal distention.  Genitourinary: Negative for dysuria, urgency, frequency, hematuria, flank pain, decreased urine volume, difficulty urinating and dyspareunia.  Musculoskeletal: Negative for back pain, joint swelling, arthralgias and gait problem.  Neurological: Negative for  dizziness, tremors, seizures, syncope, facial asymmetry, speech difficulty, weakness, light-headedness, numbness and headaches.  Hematological: Negative for adenopathy. Does not bruise/bleed easily.  Psychiatric/Behavioral: Negative for hallucinations, behavioral problems, confusion, dysphoric mood, decreased concentration and agitation.     Past Medical History  Diagnosis Date  . Coronary artery disease   . Hypertension   . Hypercholesteremia   . Kidney stone   . Tobacco use disorder   . Hx of CABG   . COPD (chronic obstructive pulmonary disease) ([facility])   . Bipolar disorder (manic depression) ([facility])   . Asthma    Past Surgical History  Procedure Laterality Date  . Coronary artery bypass graft    . Kidney stone surgery    . Appendectomy    . Lower extremity angiogram N/A 08/15/2012    Procedure: LOWER EXTREMITY ANGIOGRAM with possible PTA /stent;  Surgeon: Corky CraftsJayadeep S Varanasi, MD;  [facility];  Service: Cardiovascular;  Laterality: N/A;  . Abdominal angiogram  08/15/2012    Procedure: ABDOMINAL ANGIOGRAM;  Surgeon: Corky CraftsJayadeep S Varanasi, MD;  [facility];  Service: Cardiovascular;;  . Cardioversion N/A 08/28/2012    Procedure: Pseudo Compression;  Surgeon: Chuck Hinthristopher S Dickson, MD;  [facility];  Service: Cardiovascular;  Laterality: N/A;  . Cystoscopy with retrograde pyelogram, ureteroscopy and stent placement Right 12/31/2014    Procedure: CYSTOSCOPY  RIGHT URETEROSCOPY WITH STONE RETREIVAL RIGHT RETROGRADE PYELOGRAM;  Surgeon: Malen GauzePatrick L McKenzie, MD;  Location: [facility] ORS;  Service: Urology;  Laterality: Right;   Social History:  reports that he has been smoking Cigarettes.  He has a 31 pack-year smoking history. He quit smokeless tobacco use about 1 years ago. He reports that he drinks alcohol. He reports that he  uses illicit drugs (Cocaine).  No Known Allergies Family History  Problem Relation Age of Onset  . Heart disease Father   . Hyperlipidemia Father    . Heart disease Maternal Grandmother     Prior to Admission medications   Medication Sig Start Date End Date Taking? Authorizing Provider  aspirin 325 MG EC tablet Take 325 mg by mouth daily.    Historical Provider, MD  FLUoxetine (PROZAC) 10 MG capsule Take 1 capsule (10 mg total) by mouth daily. 03/11/15   Oneta Rack, NP  metoprolol (LOPRESSOR) 50 MG tablet Take 50 mg by mouth 2 (two) times daily.     Historical Provider, MD  mometasone-formoterol (DULERA) 100-5 MCG/ACT AERO Inhale 2 puffs into the lungs 2 (two) times daily. Patient not taking: Reported on 04/01/2015 02/11/15   Jimmy Footman, MD  traZODone (DESYREL) 100 MG tablet Take 1 tablet (100 mg total) by mouth at bedtime as needed for sleep. 02/11/15   Jimmy Footman, MD   Physical Exam: Blood pressure 77/51, pulse 99, temperature 97.4 F (36.3 C), temperature source Oral, resp. rate 18, height  (1.778 m), weight 94.802 kg (209 lb), SpO2 97 %. Filed Vitals:   04/12/15 0630 04/12/15 0631  BP: 111/83 77/51  Pulse: 97 99  Temp: 97.4 F (36.3 C)   Resp: 18     Physical Exam  Constitutional: Appears well-developed and well-nourished. No distress.  HENT: Normocephalic. External right and left ear normal. Oropharynx is clear and moist.  Eyes: Conjunctivae and EOM are normal. PERRLA, no scleral icterus.  Neck: Normal ROM. Neck supple. No JVD. No tracheal deviation. No thyromegaly.  CVS: RRR, S1/S2 +, no murmurs, no gallops, no carotid bruit.  Pulmonary: Effort and breath sounds normal, no stridor, rhonchi, wheezes, rales.  Abdominal: Soft. BS +,  no distension, tenderness, rebound or guarding.  Musculoskeletal: Normal range of motion. No edema and no tenderness.  Lymphadenopathy: No lymphadenopathy noted, cervical, inguinal. Neuro: Alert. Normal reflexes, muscle tone coordination. No cranial nerve deficit. Skin: Skin is warm and dry. No rash noted. Not diaphoretic. No erythema. No pallor.   Psychiatric: Normal mood and affect. Behavior, judgment, thought content normal.     Labs on Admission:  Basic Metabolic Panel: No results for input(s): NA, K, CL, CO2, GLUCOSE, BUN, CREATININE, CALCIUM, MG, PHOS in the last 168 hours. Liver Function Tests: No results for input(s): AST, ALT, ALKPHOS, BILITOT, PROT, ALBUMIN in the last 168 hours. No results for input(s): LIPASE, AMYLASE in the last 168 hours. No results for input(s): AMMONIA in the last 168 hours. CBC: No results for input(s): WBC, NEUTROABS, HGB, HCT, MCV, PLT in the last 168 hours. Cardiac Enzymes: No results for input(s): CKTOTAL, CKMB, CKMBINDEX, TROPONINI in the last 168 hours. BNP: Invalid input(s): POCBNP CBG: No results for input(s): GLUCAP in the last 168 hours.  Radiological Exams on Admission: No results found.  EKG: not available   Time spent: 30 minutes   MAGICK-Lateasha Breuer [facility] Pager [phone]  If 7PM-7AM, please contact night-coverage [email or web address] Password [email or web address] 04/12/2015, 4:11 PM

## 2015-04-12 NOTE — Progress Notes (Signed)
Adult Psychoeducational Group Note  Date:  04/12/2015 Time:  9:41 PM  Group Topic/Focus:  Wrap-Up Group:   The focus of this group is to help patients review their daily goal of treatment and discuss progress on daily workbooks.  Participation Level:  Active  Participation Quality:  Appropriate and Attentive  Affect:  Appropriate  Cognitive:  Appropriate  Insight: Appropriate and Good  Engagement in Group:  Engaged  Modes of Intervention:  Education  Additional Comments:  Pt goal had a good day today and pt is excited about discharging tomorrow.   Merlinda FrederickKeshia S Nicole Hafley 04/12/2015, 9:41 PM

## 2015-04-12 NOTE — Tx Team (Signed)
Interdisciplinary Treatment Plan Update (Adult) Date: 04/12/2015   Date: 04/12/2015 4:06 PM  Progress in Treatment:  Attending groups: Yes Participating in groups: No Taking medication as prescribed: Yes  Tolerating medication: Yes  Family/Significant othe contact made: No, Pt declines Patient understands diagnosis: Yes Discussing patient identified problems/goals with staff: Yes  Medical problems stabilized or resolved: Yes  Denies suicidal/homicidal ideation: No, endorses passive SI Patient has not harmed self or Others: Yes   New problem(s) identified: None identified at this time.   Discharge Plan or Barriers: CSW will assess for appropriate discharge plan and relevant barriers.   Additional comments: n/a   Reason for Continuation of Hospitalization:  Anxiety Depression Medication stabilization Suicidal ideation  Estimated length of stay: 1-2 days  Review of initial/current patient goals per problem list:   1.  Goal(s): Patient will participate in aftercare plan  Met:  No  Target date: 3-5 days from date of admission   As evidenced by: Patient will participate within aftercare plan AEB aftercare provider and housing plan at discharge being identified.  04/07/15: CSW to work with Pt to assess for appropriate discharge plan and faciliate appointments and referrals as needed prior to d/c. 04/12/15: CSW continuing to assess; Pt will [facility] with [facility]  2.  Goal (s): Patient will exhibit decreased depressive symptoms and suicidal ideations.  Met:  No  Target date: 3-5 days from date of admission   As evidenced by: Patient will utilize self rating of depression at 3 or below and demonstrate decreased signs of depression or be deemed stable for discharge by MD. 04/07/15: Pt was admitted with symptoms of depression, rating 10/10. Pt continues to present with flat affect and depressive symptoms.  Pt will demonstrate decreased symptoms of  depression and rate depression at 3/10 or lower prior to discharge. 04/12/15: Pt rates depression at 7/10; endorses SI, reports he has multiple plans  3.  Goal(s): Patient will demonstrate decreased signs and symptoms of anxiety.  Met:  No  Target date: 3-5 days from date of admission   As evidenced by: Patient will utilize self rating of anxiety at 3 or below and demonstrated decreased signs of anxiety, or be deemed stable for discharge by MD 04/07/15: Pt was admitted with increased levels of anxiety and is currently rating those symptoms highly. Pt will demonstrated decreased symptoms of anxiety and rate it at 3/10 prior to d/c. 04/12/15: Pt rates anxiety at 8/10  Attendees:  Patient:    Family:    Physician: Dr. Parke Poisson, MD  04/12/2015 4:06 PM  Nursing: Lars Pinks, RN Case manager  04/12/2015 4:06 PM  Clinical Social Worker Peri Maris, [place] 04/12/2015 4:06 PM  Other: Tilden Fossa, [place] 04/12/2015 4:06 PM  Clinical: Corky Mull, RN 04/12/2015 4:06 PM  Other: , RN Charge Nurse 04/12/2015 4:06 PM  Other:     Peri Maris, [place] Work [phone]

## 2015-04-12 NOTE — [facility] Group Notes (Signed)
[facility]  04/12/2015 8:45 AM  Participation Quality: Alert, Appropriate and Oriented  Mood/Affect: Flat  Depression Rating: 7  Anxiety Rating: 8  Thoughts of Suicide: Pt endorses SI; states he has time in the hospital to "develop several plans"  Will you contract for safety? Yes  Current AVH: Pt denies  Plan for Discharge/Comments: Pt attended discharge planning group and actively participated in group. CSW discussed suicide prevention education with the group and encouraged them to discuss discharge planning and any relevant barriers. Pt talked at length about his physical pain and depression. He is still uncertain about his discharge plan.  Transportation Means: Pt reports access to transportation  Supports: No supports mentioned at this time  Chad CordialLauren Carter, LCSWA 04/12/2015 10:16 AM

## 2015-04-12 NOTE — Progress Notes (Signed)
D: Patient has been med seeking.  Patient did not understand his order for 0.5 klonopin X2.  Explained to patient he got his last dose this afternoon.  He also did not comprehend that his haldol was discontinued.  He stated, "what am I supposed to do tonight.  How much ambien do I have?"  Patient rates his depression as an 8; hopelessness and anxiety as a 9.  He states, "I have a big day tomorrow.  I'm being discharged and I've got lots to do.  I need a good night's sleep."  Patient's goal today is to "find a place to stay."  He endorses passive SI and contracts for safety on the unit.  He denies HI/AVH.   A: Continue to monitor medication management and MD orders.  Safety checks completed every 15 minutes per protocol.  Offer support and encouragement as needed. R: Patient's behavior is appropriate; he is pleasant upon approach.

## 2015-04-13 MED ORDER — METFORMIN HCL 500 MG PO TABS: 500.0000 mg | ORAL_TABLET | Freq: Every day | ORAL | Status: DC

## 2015-04-13 MED ORDER — PANTOPRAZOLE SODIUM 40 MG PO TBEC: 40.0000 mg | DELAYED_RELEASE_TABLET | Freq: Every day | ORAL | Status: DC

## 2015-04-13 MED ORDER — LURASIDONE HCL 40 MG PO TABS: 40.0000 mg | ORAL_TABLET | Freq: Every day | ORAL | Status: DC

## 2015-04-13 MED ORDER — TRAZODONE HCL 100 MG PO TABS: 100.0000 mg | ORAL_TABLET | Freq: Every evening | ORAL | Status: DC | PRN

## 2015-04-13 MED ORDER — HYDROXYZINE HCL 50 MG PO TABS: 50.0000 mg | ORAL_TABLET | Freq: Four times a day (QID) | ORAL | Status: DC | PRN

## 2015-04-13 MED ORDER — ALBUTEROL SULFATE HFA 108 (90 BASE) MCG/ACT IN AERS: 2.0000 | INHALATION_SPRAY | Freq: Four times a day (QID) | RESPIRATORY_TRACT | Status: DC | PRN

## 2015-04-13 MED ORDER — TRAZODONE HCL 100 MG PO TABS
100.0000 mg | ORAL_TABLET | Freq: Every evening | ORAL | Status: DC | PRN
  Filled 2015-04-13: qty 7

## 2015-04-13 MED ORDER — ATORVASTATIN CALCIUM 20 MG PO TABS: 20.0000 mg | ORAL_TABLET | Freq: Every day | ORAL | Status: DC

## 2015-04-13 MED ORDER — FLUOXETINE HCL 20 MG PO CAPS: 20.0000 mg | ORAL_CAPSULE | Freq: Every day | ORAL | Status: DC

## 2015-04-13 NOTE — Progress Notes (Signed)
  [facility] Discharge [facility] :  Will you be returning to the same living situation after discharge:  No. Pt plans to go with father to find boarding house At discharge, do you have transportation home?: Yes,  father to provide transportation Do you have the ability to pay for your medications: Yes,  Pt provided with prescriptions and supply of medication  Release of information consent forms completed and in the chart;  Patient's signature needed at discharge.  Patient to Follow up at: Follow-up Information    [facility].   Specialty:  Professional Counselor   Why:  Due to confidentiality, they require that you office to schedule follow-up for medication management and counseling.    Contact information:   [facility] [address] [place] [address] [phone]       Next level of care provider [facility] Link:no  Patient denies SI/HI: Yes,  Pt denies    Safety Planning and Suicide Prevention discussed: Yes,  with Pt; declines family contact  Have you used any form of tobacco in the last 30 days? (Cigarettes, Smokeless Tobacco, Cigars, and/or Pipes): Yes  Has patient been referred to the Quitline?: Patient refused referral  Elaina HoopsCarter, [place] M 04/13/2015, 10:21 AM

## 2015-04-13 NOTE — Tx Team (Signed)
Interdisciplinary Treatment Plan Update (Adult) Date: 04/13/2015   Date: 04/13/2015 10:19 AM  Progress in Treatment:  Attending groups: Yes Participating in groups: No Taking medication as prescribed: Yes  Tolerating medication: Yes  Family/Significant othe contact made: No, Pt declines Patient understands diagnosis: Yes Discussing patient identified problems/goals with staff: Yes  Medical problems stabilized or resolved: Yes  Denies suicidal/homicidal ideation: Yes Patient has not harmed self or Others: Yes   New problem(s) identified: None identified at this time.   Discharge Plan or Barriers: CSW will assess for appropriate discharge plan and relevant barriers.   Additional comments: n/a   Reason for Continuation of Hospitalization:  Anxiety Depression Medication stabilization Suicidal ideation  Estimated length of stay: 0 days; Pt stable for DC  Review of initial/current patient goals per problem list:   1.  Goal(s): Patient will participate in aftercare plan  Met:  Yes  Target date: 3-5 days from date of admission   As evidenced by: Patient will participate within aftercare plan AEB aftercare provider and housing plan at discharge being identified.  04/07/15: CSW to work with Pt to assess for appropriate discharge plan and faciliate appointments and referrals as needed prior to d/c. 04/12/15: CSW continuing to assess; [facility] 04/13/15: Pt will discharge with his father; will follow-up with [facility]  2.  Goal (s): Patient will exhibit decreased depressive symptoms and suicidal ideations.  Met:  Yes  Target date: 3-5 days from date of admission   As evidenced by: Patient will utilize self rating of depression at 3 or below and demonstrate decreased signs of depression or be deemed stable for discharge by MD. 04/07/15: Pt was admitted with symptoms of depression, rating 10/10. Pt continues to present with flat affect and  depressive symptoms.  Pt will demonstrate decreased symptoms of depression and rate depression at 3/10 or lower prior to discharge. 04/12/15: Pt rates depression at 7/10; endorses SI, reports he has multiple plans 04/13/15: Pt rates depression at 2/10; denies SI  3.  Goal(s): Patient will demonstrate decreased signs and symptoms of anxiety.  Met:  Yes  Target date: 3-5 days from date of admission   As evidenced by: Patient will utilize self rating of anxiety at 3 or below and demonstrated decreased signs of anxiety, or be deemed stable for discharge by MD 04/07/15: Pt was admitted with increased levels of anxiety and is currently rating those symptoms highly. Pt will demonstrated decreased symptoms of anxiety and rate it at 3/10 prior to d/c. 04/12/15: Pt rates anxiety at 8/10 04/13/15: Pt rates anxiety at 2/10  Attendees:  Patient:    Family:    Physician: Dr. Cobos, MD  04/13/2015 10:19 AM  Nursing: Jennifer Clark, RN Case manager  04/13/2015 10:19 AM  Clinical Social Worker  Carter, LCSWA 04/13/2015 10:19 AM  Other: Kristin Drinkard, LCSWA 04/13/2015 10:19 AM  Clinical: Steve Kellam, RN 04/13/2015 10:19 AM  Other: , RN Charge Nurse 04/13/2015 10:19 AM  Other:      Carter, LCSWA Clinical Social Work [phone]      

## 2015-04-13 NOTE — BHH Suicide Risk Assessment (Signed)
[facility] Discharge Suicide Risk Assessment   Demographic Factors:  44 year old male , was living with family member.  Total Time spent with patient: 30 minutes  Musculoskeletal: Strength & Muscle Tone: within normal limits Gait & Station: normal Patient leans: N/A  Psychiatric Specialty Exam: Physical Exam  ROS  Blood pressure 77/51, pulse 99, temperature 97.4 F (36.3 C), temperature source Oral, resp. rate 18, height  (1.778 m), weight 209 lb (94.802 kg), SpO2 97 %.Body mass index is 29.99 kg/(m^2).  General Appearance: improved grooming   Eye Contact::  Good  Speech:  Normal Rate409  Volume:  Normal  Mood:  improved, and states today feeling much better than on admission  Affect:  more reactive, smiles at times appropriately  Thought Process:  Linear  Orientation:  Full (Time, Place, and Person)  Thought Content:  no hallucinations, no delusions  Suicidal Thoughts:  No- at this time denies any suicidal ideations and denies any self injurious ideations  Homicidal Thoughts:  No  Memory:  recent and remote grossly intact   Judgement:  Other:  improved   Insight:  improved   Psychomotor Activity:  Normal  Concentration:  Good  Recall:  Good  Fund of Knowledge:Good  Language: Good  Akathisia:  Negative  Handed:  Right  AIMS (if indicated):     Assets:  Desire for Improvement Resilience  Sleep:  Number of Hours: 3.25  Cognition: WNL  ADL's:  Intact   Have you used any form of tobacco in the last 30 days? (Cigarettes, Smokeless Tobacco, Cigars, and/or Pipes): Yes  Has this patient used any form of tobacco in the last 30 days? (Cigarettes, Smokeless Tobacco, Cigars, and/or Pipes) Yes, A prescription for an FDA-approved tobacco cessation medication was offered at discharge and the patient refused  Mental Status Per Nursing Assessment::   On Admission:  Suicidal ideation indicated by patient, Suicide plan, Plan includes specific time, place, or method, Belief that plan  would result in death  Current Mental Status by Physician: At this time patient is improved compared to admission- today he presents with improved grooming, improved eye contact, normal speech, mood improved, affect more reactive, and smiles often/appropriately today. No thought disorder, no suicidal ideations, no homicidal ideations, no psychotic symptoms, future oriented   Loss Factors:  legal issues, relationship tension/ stressors  Historical Factors: History of mood disorder, has been diagnosed with Bipolar Disorder in the past, history of alcohol dependence  Risk Reduction Factors:   Sense of responsibility to family and Positive coping skills or problem solving skills  Continued Clinical Symptoms:  As noted, patient currently significantly improved compared to admission. Patient is now on latuda, which thus far he has tolerated well. It replaced Seroquel, which may have been contributing to hyperglycemia and hyperlipidemia, weight gain.  Patient aware of lab findings- elevated HgbA1C, and has been started on Glucophage by Hospitalist.   Cognitive Features That Contribute To Risk:  No gross cognitive deficits noted upon discharge. Is alert , attentive, and oriented x 3    Suicide Risk:  Mild:  Suicidal ideation of limited frequency, intensity, duration, and specificity.  There are no identifiable plans, no associated intent, mild dysphoria and related symptoms, good self-control (both objective and subjective assessment), few other risk factors, and identifiable protective factors, including available and accessible social support.  Principal Problem: Bipolar 1 disorder, depressed [facility]) Discharge Diagnoses:  Patient Active Problem List   Diagnosis Date Noted  . Cocaine use disorder, moderate, dependence (HCC) [F14.20]   .  Alcohol use disorder, moderate, dependence (HCC) [F10.20] 04/02/2015  . Bipolar 1 disorder, depressed (HCC) [F31.9] 04/02/2015  . Alcohol abuse [F10.10]  03/08/2015  . Cocaine abuse [F14.10] 03/08/2015  . Substance induced mood disorder (HCC) [F19.94] 03/08/2015  . Suicidal ideation [R45.851]   . COPD (chronic obstructive pulmonary disease) (HCC) [J44.9] 02/09/2015  . Tobacco use disorder [F17.200] 02/09/2015  . Stimulant use disorder (cocaine) [F15.90] 02/09/2015  . Alcohol use disorder, severe, in sustained remission (HCC) [F10.21] 02/09/2015  . Ureteral stone [N20.1] 12/31/2014  . 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 CAD s/p CABG 2012 [facility] [I25.810] 11/28/2011  . Hyperlipidemia LDL goal <100 [E78.5] 03/13/2007  . Essential hypertension [I10] 03/13/2007  . GERD [K21.9] 03/13/2007    Follow-up Information    [facility].   Specialty:  Professional Counselor   Why:  Due to confidentiality, they require that you office to schedule follow-up for medication management and counseling.    Contact information:   [facility] [address] [place] [address] [phone]       Plan Of Care/Follow-up recommendations:  Activity:  as tolerated  Diet:  heart healthy, diabetic diet  Tests:  NA Other:  see below  Is patient on multiple antipsychotic therapies at discharge:  No   Has Patient had three or more failed trials of antipsychotic monotherapy by history:  No  Recommended Plan for Multiple Antipsychotic Therapies: NA  Patient is leaving unit in good spirits. Plans to follow up as above. Encouraged him to follow up with PCP soon in order to continue glucose monitoring and treatment as indicated . [facility] Dr[facility] Manson PasseyBrown.  Encouraged patient to continue focusing on recovery / relapse prevention efforts- states he plans to go to [employer] meetings regularly.   COBOS, FERNANDO 04/13/2015, 10:53 AM

## 2015-04-13 NOTE — Progress Notes (Signed)
Patient ID: Billey ChangRoger L Lortie, male   DOB: 01/27/1971, 44 y.o.   MRN: [id number] NSG D/C Note: Pt denies si/hi at this time. States that he will comply with outpt services and take his meds as prescribed. D/C to lobby. Pt states that [facility]

## 2015-04-13 NOTE — Progress Notes (Signed)
D:  Patient has been attention seeking and anxious throughout the evening.  Patient indicated that his medications were changed and he felt skeptical that Ambien would work for him.  He took initial dose then asked for a repeat dose one hour later which he was given.  He then continued to state that he could not sleep and was given Vistaril to assist.  He got up around 0030 and reported that he had given up on sleep and was going to take a shower.  He was given Klonopin to help him relax.  A:  Medications administered as ordered.  Order for additional dose of Ambien obtained, but was not effective.  Safety checks q 15 minutes.  Emotional support provided.  R:  Safety maintained on unit.
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
## 2015-04-23 ENCOUNTER — Observation Stay (HOSPITAL_COMMUNITY)
Admission: AD | Admit: 2015-04-23 | Discharge: 2015-04-25 | Disposition: A | Discharge status: Home / Self Care | Payer: Federal, State, Local not specified - Other | Source: Intra-hospital | Attending: Nurse Practitioner | Admitting: Nurse Practitioner

## 2015-04-23 ENCOUNTER — Emergency Department (HOSPITAL_COMMUNITY)
Admission: EM | Admit: 2015-04-23 | Discharge: 2015-04-23 | Disposition: A | Discharge status: Other Acute Inpatient Hospital | Payer: Self-pay | Attending: Emergency Medicine | Admitting: Emergency Medicine

## 2015-04-23 ENCOUNTER — Encounter (HOSPITAL_COMMUNITY): Payer: Self-pay | Admitting: *Deleted

## 2015-04-23 DIAGNOSIS — I2510 Atherosclerotic heart disease of native coronary artery without angina pectoris: Secondary | ICD-10-CM | POA: Insufficient documentation

## 2015-04-23 DIAGNOSIS — F1424 Cocaine dependence with cocaine-induced mood disorder: Principal | ICD-10-CM | POA: Insufficient documentation

## 2015-04-23 DIAGNOSIS — F17210 Nicotine dependence, cigarettes, uncomplicated: Secondary | ICD-10-CM | POA: Insufficient documentation

## 2015-04-23 DIAGNOSIS — F319 Bipolar disorder, unspecified: Secondary | ICD-10-CM | POA: Insufficient documentation

## 2015-04-23 DIAGNOSIS — E7800 Pure hypercholesterolemia, unspecified: Secondary | ICD-10-CM | POA: Insufficient documentation

## 2015-04-23 DIAGNOSIS — I739 Peripheral vascular disease, unspecified: Secondary | ICD-10-CM | POA: Insufficient documentation

## 2015-04-23 DIAGNOSIS — I252 Old myocardial infarction: Secondary | ICD-10-CM | POA: Insufficient documentation

## 2015-04-23 DIAGNOSIS — J45909 Unspecified asthma, uncomplicated: Secondary | ICD-10-CM | POA: Insufficient documentation

## 2015-04-23 DIAGNOSIS — Z590 Homelessness: Secondary | ICD-10-CM | POA: Insufficient documentation

## 2015-04-23 DIAGNOSIS — F1420 Cocaine dependence, uncomplicated: Secondary | ICD-10-CM | POA: Diagnosis present

## 2015-04-23 DIAGNOSIS — J449 Chronic obstructive pulmonary disease, unspecified: Secondary | ICD-10-CM | POA: Insufficient documentation

## 2015-04-23 DIAGNOSIS — I10 Essential (primary) hypertension: Secondary | ICD-10-CM | POA: Insufficient documentation

## 2015-04-23 DIAGNOSIS — R45851 Suicidal ideations: Secondary | ICD-10-CM

## 2015-04-23 DIAGNOSIS — Z7984 Long term (current) use of oral hypoglycemic drugs: Secondary | ICD-10-CM | POA: Insufficient documentation

## 2015-04-23 DIAGNOSIS — Z87442 Personal history of urinary calculi: Secondary | ICD-10-CM | POA: Insufficient documentation

## 2015-04-23 DIAGNOSIS — Z951 Presence of aortocoronary bypass graft: Secondary | ICD-10-CM | POA: Insufficient documentation

## 2015-04-23 DIAGNOSIS — Z87891 Personal history of nicotine dependence: Secondary | ICD-10-CM | POA: Insufficient documentation

## 2015-04-23 DIAGNOSIS — F32A Depression, unspecified: Secondary | ICD-10-CM

## 2015-04-23 DIAGNOSIS — F1994 Other psychoactive substance use, unspecified with psychoactive substance-induced mood disorder: Secondary | ICD-10-CM | POA: Diagnosis present

## 2015-04-23 DIAGNOSIS — F1020 Alcohol dependence, uncomplicated: Secondary | ICD-10-CM | POA: Insufficient documentation

## 2015-04-23 DIAGNOSIS — F329 Major depressive disorder, single episode, unspecified: Secondary | ICD-10-CM

## 2015-04-23 DIAGNOSIS — K219 Gastro-esophageal reflux disease without esophagitis: Secondary | ICD-10-CM | POA: Insufficient documentation

## 2015-04-23 DIAGNOSIS — Z79899 Other long term (current) drug therapy: Secondary | ICD-10-CM | POA: Insufficient documentation

## 2015-04-23 DIAGNOSIS — F1410 Cocaine abuse, uncomplicated: Secondary | ICD-10-CM | POA: Insufficient documentation

## 2015-04-23 DIAGNOSIS — E119 Type 2 diabetes mellitus without complications: Secondary | ICD-10-CM | POA: Insufficient documentation

## 2015-04-23 LAB — CBC WITH DIFFERENTIAL/PLATELET
Basophils Absolute: 0 10*3/uL (ref 0.0–0.1)
Basophils Relative: 0 %
EOS PCT: 6 %
Eosinophils Absolute: 0.5 10*3/uL (ref 0.0–0.7)
HCT: 44.8 % (ref 39.0–52.0)
Hemoglobin: 15.7 g/dL (ref 13.0–17.0)
LYMPHS ABS: 1.4 10*3/uL (ref 0.7–4.0)
LYMPHS PCT: 19 %
MCH: 33.2 pg (ref 26.0–34.0)
MCHC: 35 g/dL (ref 30.0–36.0)
MCV: 94.7 fL (ref 78.0–100.0)
MONO ABS: 0.5 10*3/uL (ref 0.1–1.0)
MONOS PCT: 7 %
Neutro Abs: 5.2 10*3/uL (ref 1.7–7.7)
Neutrophils Relative %: 68 %
PLATELETS: 367 10*3/uL (ref 150–400)
RBC: 4.73 MIL/uL (ref 4.22–5.81)
RDW: 13 % (ref 11.5–15.5)
WBC: 7.6 10*3/uL (ref 4.0–10.5)

## 2015-04-23 LAB — COMPREHENSIVE METABOLIC PANEL
ALT: 35 U/L (ref 17–63)
ANION GAP: 10 (ref 5–15)
AST: 26 U/L (ref 15–41)
Albumin: 4.5 g/dL (ref 3.5–5.0)
Alkaline Phosphatase: 88 U/L (ref 38–126)
BUN: 19 mg/dL (ref 6–20)
CHLORIDE: 103 mmol/L (ref 101–111)
CO2: 26 mmol/L (ref 22–32)
CREATININE: 1.08 mg/dL (ref 0.61–1.24)
Calcium: 9.6 mg/dL (ref 8.9–10.3)
GFR calc non Af Amer: >60 mL/min (ref >60)
Glucose, Bld: 131 mg/dL — ABNORMAL HIGH (ref 65–99)
POTASSIUM: 3.5 mmol/L (ref 3.5–5.1)
SODIUM: 139 mmol/L (ref 135–145)
Total Bilirubin: 0.9 mg/dL (ref 0.3–1.2)
Total Protein: 8.1 g/dL (ref 6.5–8.1)

## 2015-04-23 LAB — RAPID URINE DRUG SCREEN, HOSP PERFORMED
AMPHETAMINES: NOT DETECTED
BENZODIAZEPINES: NOT DETECTED
Barbiturates: NOT DETECTED
COCAINE: POSITIVE — AB
Opiates: NOT DETECTED
Tetrahydrocannabinol: NOT DETECTED

## 2015-04-23 LAB — ETHANOL: Alcohol, Ethyl (B): <5 mg/dL (ref <5)

## 2015-04-23 LAB — SALICYLATE LEVEL

## 2015-04-23 LAB — ACETAMINOPHEN LEVEL

## 2015-04-23 MED ORDER — LORAZEPAM 1 MG PO TABS: 1.0000 mg | ORAL_TABLET | Freq: Three times a day (TID) | ORAL | Status: DC | PRN

## 2015-04-23 MED ORDER — ONDANSETRON HCL 4 MG PO TABS: 4.0000 mg | ORAL_TABLET | Freq: Three times a day (TID) | ORAL | Status: DC | PRN

## 2015-04-23 MED ORDER — FLUOXETINE HCL 20 MG PO CAPS
20.0000 mg | ORAL_CAPSULE | Freq: Every day | ORAL | Status: DC
  Administered 2015-04-23: 20 mg via ORAL
  Filled 2015-04-23: qty 1

## 2015-04-23 MED ORDER — ATORVASTATIN CALCIUM 10 MG PO TABS
20.0000 mg | ORAL_TABLET | Freq: Every day | ORAL | Status: DC
  Administered 2015-04-23 – 2015-04-24 (×2): 20 mg via ORAL
  Filled 2015-04-23 (×2): qty 2

## 2015-04-23 MED ORDER — PANTOPRAZOLE SODIUM 40 MG PO TBEC
40.0000 mg | DELAYED_RELEASE_TABLET | Freq: Every day | ORAL | Status: DC
  Administered 2015-04-24 – 2015-04-25 (×2): 40 mg via ORAL
  Filled 2015-04-23 (×2): qty 1

## 2015-04-23 MED ORDER — PANTOPRAZOLE SODIUM 40 MG PO TBEC
40.0000 mg | DELAYED_RELEASE_TABLET | Freq: Every day | ORAL | Status: DC
  Administered 2015-04-23: 40 mg via ORAL
  Filled 2015-04-23: qty 1

## 2015-04-23 MED ORDER — CLONIDINE HCL 0.1 MG PO TABS
0.1000 mg | ORAL_TABLET | Freq: Once | ORAL | Status: AC
  Administered 2015-04-23: 0.1 mg via ORAL
  Filled 2015-04-23: qty 1

## 2015-04-23 MED ORDER — ALBUTEROL SULFATE HFA 108 (90 BASE) MCG/ACT IN AERS: 2.0000 | INHALATION_SPRAY | Freq: Four times a day (QID) | RESPIRATORY_TRACT | Status: DC | PRN

## 2015-04-23 MED ORDER — MAGNESIUM HYDROXIDE 400 MG/5ML PO SUSP: 30.0000 mL | Freq: Every day | ORAL | Status: DC | PRN

## 2015-04-23 MED ORDER — TRAZODONE HCL 100 MG PO TABS
100.0000 mg | ORAL_TABLET | Freq: Every evening | ORAL | Status: DC | PRN
Start: 2015-04-23 — End: 2015-04-23

## 2015-04-23 MED ORDER — ZOLPIDEM TARTRATE 5 MG PO TABS: 5.0000 mg | ORAL_TABLET | Freq: Every evening | ORAL | Status: DC | PRN

## 2015-04-23 MED ORDER — ACETAMINOPHEN 325 MG PO TABS: 650.0000 mg | ORAL_TABLET | ORAL | Status: DC | PRN

## 2015-04-23 MED ORDER — LURASIDONE HCL 40 MG PO TABS
40.0000 mg | ORAL_TABLET | Freq: Every day | ORAL | Status: DC
  Administered 2015-04-24 – 2015-04-25 (×2): 40 mg via ORAL
  Filled 2015-04-23 (×2): qty 1

## 2015-04-23 MED ORDER — ALUM & MAG HYDROXIDE-SIMETH 200-200-20 MG/5ML PO SUSP
30.0000 mL | ORAL | Status: DC | PRN
  Administered 2015-04-25: 30 mL via ORAL
  Filled 2015-04-23: qty 30

## 2015-04-23 MED ORDER — HYDROXYZINE HCL 25 MG PO TABS
25.0000 mg | ORAL_TABLET | Freq: Four times a day (QID) | ORAL | Status: DC | PRN
  Administered 2015-04-23: 25 mg via ORAL
  Filled 2015-04-23: qty 1

## 2015-04-23 MED ORDER — TRAZODONE HCL 100 MG PO TABS
100.0000 mg | ORAL_TABLET | Freq: Every evening | ORAL | Status: DC | PRN
  Administered 2015-04-23 – 2015-04-24 (×2): 100 mg via ORAL
  Filled 2015-04-23 (×2): qty 1

## 2015-04-23 MED ORDER — METFORMIN HCL 500 MG PO TABS
500.0000 mg | ORAL_TABLET | Freq: Every day | ORAL | Status: DC
  Filled 2015-04-23: qty 1

## 2015-04-23 MED ORDER — HYDROXYZINE HCL 50 MG PO TABS
50.0000 mg | ORAL_TABLET | Freq: Four times a day (QID) | ORAL | Status: DC | PRN
  Administered 2015-04-24 – 2015-04-25 (×5): 50 mg via ORAL
  Filled 2015-04-23 (×5): qty 1

## 2015-04-23 MED ORDER — ALUM & MAG HYDROXIDE-SIMETH 200-200-20 MG/5ML PO SUSP: 30.0000 mL | ORAL | Status: DC | PRN

## 2015-04-23 MED ORDER — LURASIDONE HCL 40 MG PO TABS
40.0000 mg | ORAL_TABLET | Freq: Every day | ORAL | Status: DC
  Filled 2015-04-23: qty 1

## 2015-04-23 MED ORDER — LORAZEPAM 1 MG PO TABS
1.0000 mg | ORAL_TABLET | Freq: Three times a day (TID) | ORAL | Status: DC | PRN
  Administered 2015-04-24 – 2015-04-25 (×4): 1 mg via ORAL
  Filled 2015-04-23 (×4): qty 1

## 2015-04-23 MED ORDER — ATORVASTATIN CALCIUM 20 MG PO TABS
20.0000 mg | ORAL_TABLET | Freq: Every day | ORAL | Status: DC
  Filled 2015-04-23: qty 1

## 2015-04-23 MED ORDER — ACETAMINOPHEN 325 MG PO TABS
650.0000 mg | ORAL_TABLET | Freq: Four times a day (QID) | ORAL | Status: DC | PRN
  Administered 2015-04-24: 650 mg via ORAL
  Filled 2015-04-23: qty 2

## 2015-04-23 MED ORDER — HYDROXYZINE HCL 25 MG PO TABS: 50.0000 mg | ORAL_TABLET | Freq: Four times a day (QID) | ORAL | Status: DC | PRN

## 2015-04-23 MED ORDER — FLUOXETINE HCL 20 MG PO CAPS
20.0000 mg | ORAL_CAPSULE | Freq: Every day | ORAL | Status: DC
  Administered 2015-04-24 – 2015-04-25 (×2): 20 mg via ORAL
  Filled 2015-04-23 (×2): qty 1

## 2015-04-23 MED ORDER — METFORMIN HCL 500 MG PO TABS
500.0000 mg | ORAL_TABLET | Freq: Every day | ORAL | Status: DC
  Administered 2015-04-24 – 2015-04-25 (×2): 500 mg via ORAL
  Filled 2015-04-23 (×2): qty 1

## 2015-04-23 MED ORDER — LORAZEPAM 1 MG PO TABS
1.0000 mg | ORAL_TABLET | Freq: Once | ORAL | Status: AC
  Administered 2015-04-23: 1 mg via ORAL
  Filled 2015-04-23: qty 1
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
## 2015-04-23 NOTE — BH Assessment (Addendum)
BHH Assessment Progress Note   Per Thedore MinsMojeed Akintayo, MD, this pt would benefit [facility] at [facility]. Berneice Heinrichina Tate, RN, [facility] pt to Obs 6. Pt has signed Voluntary Admission and Consent for Treatment, as well as Consent to Release Information to [facility], and a notification call has been placed.  Signed [facility]. Pt's nurse, Rayfield CitizenCaroline, has been notified, and agrees to send original paperwork along with pt via [facility] [place], and to call report to [phone] or [phone].  Doylene Canninghomas Jeannette Maddy, MA Triage Specialist [phone]  Addendum:  When notification call was placed to [facility], they requested that pt's record be faxed to them when he is being prepared for discharge.  Their fax # is [phone].  Please send record to the attention of Dr Christin FudgeSteele.  Doylene Canninghomas Shana Zavaleta, MA Triage Specialist [phone]

## 2015-04-23 NOTE — Progress Notes (Signed)
Nursing Admission Note:  [facility] with admitting Diagnosis of MDD, recurrent mild. Patient was recently discharged from Adult Inpatient Unit with MDD. Patient states he is "no better", "I still feel the same way; when i wake up in the morning I'm pissed off that I woke up again". Patient is cooperative and pleasant, and even smiling and chuckling a little bit during conversation with Nurse. Patient states he is having SI with a plan to overdose on heroin. Patient agrees to contract for safety, agreeing to alert staff if he feels he is going to carry out any act of self harm. Patient denies any HI or AVH. Staff providing constant observation for safety except when patient in the bathroom.

## 2015-04-23 NOTE — ED Notes (Signed)
Patient received all personal belongings.  [facility]  Reviewed admission with patient.  Patient given 0.1 mg clonidine for elevated blood pressure.  Report called to Nira Retortina T., RN, [facility].  Patient left without incident.

## 2015-04-23 NOTE — ED Provider Notes (Signed)
CSN: [id number]     Arrival date & time 04/23/15  [id number] History   First MD Initiated Contact with Steven [name] 04/23/15 0901     Chief Complaint  Steven [name] presents with  . Suicidal     (Consider location/radiation/quality/duration/timing/severity/associated sxs/prior Treatment) HPI 44 year old male who presents with suicidal thoughts. History of CAD status post CABG and stenting, bipolar disorder, hypertension, and hyperlipidemia. Was recently discharged at the end of November for psychiatric admission in the setting of increasing suicidal ideation. States that he has been compliant with medications, initially feeling well. Over the past 4-5 days has had increasing suicidal thoughts. States that he had interviewed for a new job, and does not think that he got this new job, which prompted his increasing depression. 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 Has not had any homicidal thoughts, hallucinations. Has recently started smoking tobacco again, and last used cocaine 3 days ago. Denies current alcohol or other illicit drug use. Past Medical History  Diagnosis Date  . Coronary artery disease   . Hypertension   . Hypercholesteremia   . Kidney stone   . Tobacco use disorder   . Hx of CABG   . COPD (chronic obstructive pulmonary disease) (HCC)   . Bipolar disorder (manic depression) (HCC)   . Asthma    Past Surgical History  Procedure Laterality Date  . Coronary artery bypass graft    . Kidney stone surgery    . Appendectomy    . Lower extremity angiogram N/A 08/15/2012    Procedure: LOWER EXTREMITY ANGIOGRAM with possible PTA /stent;  Surgeon: Corky CraftsJayadeep S Varanasi, MD;  [facility];  Service: Cardiovascular;  Laterality: N/A;  . Abdominal angiogram  08/15/2012    Procedure: ABDOMINAL ANGIOGRAM;  Surgeon: Corky CraftsJayadeep S Varanasi, MD;  [facility];  Service: Cardiovascular;;  .  Cardioversion N/A 08/28/2012    Procedure: Pseudo Compression;  Surgeon: Chuck Hinthristopher S Dickson, MD;  [facility];  Service: Cardiovascular;  Laterality: N/A;  . Cystoscopy with retrograde pyelogram, ureteroscopy and stent placement Right 12/31/2014    Procedure: CYSTOSCOPY  RIGHT URETEROSCOPY WITH STONE RETREIVAL RIGHT RETROGRADE PYELOGRAM;  Surgeon: Malen GauzePatrick L McKenzie, MD;  Location: [facility] ORS;  Service: Urology;  Laterality: Right;   Family History  Problem Relation Age of Onset  . Heart disease Father   . Hyperlipidemia Father   . Heart disease Maternal Grandmother    Social History  Substance Use Topics  . Smoking status: Current Every Day Smoker -- 1.00 packs/day for 31 years    Types: Cigarettes  . Smokeless tobacco: Former NeurosurgeonUser    Quit date: 04/27/2013  . Alcohol Use: Yes     Comment: last drink 03/07/2015     Review of Systems 10/14 systems reviewed and are negative other than those stated in the HPI    Allergies  Review of Steven [name]'s allergies indicates no known allergies.  Home Medications   Prior to Admission medications   Medication Sig Start Date End Date Taking? Authorizing Provider  albuterol (PROVENTIL HFA;VENTOLIN HFA) 108 (90 BASE) MCG/ACT inhaler Inhale 2 puffs into the lungs every 6 (six) hours as needed for wheezing or shortness of breath. 04/13/15  Yes Oneta Rackanika N Lewis, NP  atorvastatin (LIPITOR) 20 MG tablet Take 1 tablet (20 mg total) by mouth daily at 6 PM. 04/13/15  Yes Oneta Rackanika N Lewis, NP  FLUoxetine (PROZAC) 20 MG capsule Take 1 capsule (  20 mg total) by mouth daily. 04/13/15  Yes Oneta Rack, NP  hydrOXYzine (ATARAX/VISTARIL) 50 MG tablet Take 1 tablet (50 mg total) by mouth every 6 (six) hours as needed for anxiety. 04/13/15  Yes Oneta Rack, NP  lurasidone (LATUDA) 40 MG TABS tablet Take 1 tablet (40 mg total) by mouth daily with breakfast. 04/13/15  Yes Oneta Rack, NP  metFORMIN (GLUCOPHAGE) 500 MG tablet Take 1 tablet (500 mg total) by  mouth daily with breakfast. 04/13/15  Yes Oneta Rack, NP  pantoprazole (PROTONIX) 40 MG tablet Take 1 tablet (40 mg total) by mouth daily. 04/13/15  Yes Oneta Rack, NP  traZODone (DESYREL) 100 MG tablet Take 1 tablet (100 mg total) by mouth at bedtime as needed for sleep. 04/13/15  Yes Oneta Rack, NP  mometasone-formoterol (DULERA) 100-5 MCG/ACT AERO Inhale 2 puffs into the lungs 2 (two) times daily. Steven [name] not taking: Reported on 04/01/2015 02/11/15   Jimmy Footman, MD   BP 164/90 mmHg  Pulse 84  Temp(Src) 98.2 F (36.8 C) (Oral)  Resp 16  SpO2 97% Physical Exam Physical Exam  Nursing note and vitals reviewed. Constitutional: Well developed, well nourished, non-toxic, and in no acute distress Head: Normocephalic and atraumatic.  Mouth/Throat: Oropharynx is clear and moist.  Neck: Normal range of motion. Neck supple.  Cardiovascular: Normal rate and regular rhythm.   Pulmonary/Chest: Effort normal and breath sounds normal.  Abdominal: Soft. There is no tenderness. There is no rebound and no guarding.  Musculoskeletal: Normal range of motion.  Neurological: Alert, no facial droop, fluent speech, moves all extremities symmetrically Skin: Skin is warm and dry.  Psychiatric: Cooperative, flat affect, no delusions or paranoia  ED Course  Procedures (including critical care time) Labs Review Labs Reviewed  COMPREHENSIVE METABOLIC PANEL - Abnormal; Notable for the following:    Glucose, Bld 131 (*)    All other components within normal limits  ACETAMINOPHEN LEVEL - Abnormal; Notable for the following:    Acetaminophen (Tylenol), Serum <10 (*)    All other components within normal limits  URINE RAPID DRUG SCREEN, HOSP PERFORMED - Abnormal; Notable for the following:    Cocaine POSITIVE (*)    All other components within normal limits  ETHANOL  SALICYLATE LEVEL  CBC WITH DIFFERENTIAL/PLATELET   I have personally reviewed and evaluated these images and lab  results as part of my medical decision-making.   MDM   Final diagnoses:  Depression  Suicidal thoughts    44 year old male with history of bipolar disorder who presents to emergency department with increasing depression, suicidal thoughts with plan. Is well-appearing and in no acute distress. Vital signs are non-concerning. Physical exam is unremarkable. Tox screen positive for cocaine which she admits using 3 days ago, but no other toxicological derangements. Blood work otherwise unremarkable. He is medically cleared for psychiatric evaluation. TTS consulted and recommended inpatient admission. Pending placement at this time.  Lavera Guise, MD 04/23/15 [phone]

## 2015-04-23 NOTE — BH Assessment (Signed)
BHH Assessment Progress Note   Patient will be seen in the a.m. to determine disposition.

## 2015-04-23 NOTE — BH Assessment (Signed)
Assessment completed. Consulted with Dr. Jannifer FranklinAkintayo who recommends inpatient on [address] at this time. Informed Berneice Heinrichina Tate, [facility] or recommendation.   Davina PokeJoVea Uvaldo Rybacki, LCSW Therapeutic Triage Specialist  Health 04/23/2015 10:47 AM
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
## 2015-04-24 DIAGNOSIS — F1994 Other psychoactive substance use, unspecified with psychoactive substance-induced mood disorder: Secondary | ICD-10-CM

## 2015-04-24 DIAGNOSIS — F1420 Cocaine dependence, uncomplicated: Secondary | ICD-10-CM

## 2015-04-24 DIAGNOSIS — R45851 Suicidal ideations: Secondary | ICD-10-CM

## 2015-04-24 MED ORDER — NICOTINE 21 MG/24HR TD PT24
21.0000 mg | MEDICATED_PATCH | Freq: Every day | TRANSDERMAL | Status: DC
  Administered 2015-04-24 – 2015-04-25 (×2): 21 mg via TRANSDERMAL
  Filled 2015-04-24 (×2): qty 1

## 2015-04-24 NOTE — Progress Notes (Signed)
Patient continues to endorse SI without plans,  positive for depression stated "I am not feeling any better. Still the same only that I felt safe here". Support offered. Encourages to verbalize needs to staff. Due/prn medications given as ordered. Constant monitoring and observation for safety and stability maintained.

## 2015-04-24 NOTE — Progress Notes (Signed)
Nursing Shift Assessment:  Patient has been compliant with medications and has received anti-anxiety medications when able to, per orders. Patient states he feels "the same as yesterday" with active SI, no HI or AVH. Patient does contract for safety and agrees to alert staff if he feels he is about to self harm himself, Patient is pleasant and cooperative and compliant with medications. Staff providding emotional support, therapeutic communication and continuous observation for safety except when patient in bathroom> Patient remains safe on Unit and is in no distress; sleeping most of the time.

## 2015-04-24 NOTE — Progress Notes (Signed)
[facility] OBS Progress Note  04/24/2015 12:21 PM CORA STETSON  MRN:  [id number] Subjective:  "I don't care what happens to me.  I don't even know where I'll be a week from now.  I don't care.  I'm done." Principal Problem: Substance induced mood disorder ([place]) Diagnosis:   Patient Active Problem List   Diagnosis Date Noted  . Substance induced mood disorder ([place]) [F19.94] 03/08/2015    Priority: High  . Cocaine use disorder, moderate, dependence ([place]) [F14.20]   . Alcohol use disorder, moderate, dependence ([place]) [F10.20] 04/02/2015  . Bipolar 1 disorder, depressed ([place]) [F31.9] 04/02/2015  . Alcohol abuse [F10.10] 03/08/2015  . Cocaine abuse [F14.10] 03/08/2015  . Suicidal ideation [R45.851]   . COPD (chronic obstructive pulmonary disease) ([place]) [J44.9] 02/09/2015  . Tobacco use disorder [F17.200] 02/09/2015  . Stimulant use disorder (cocaine) [F15.90] 02/09/2015  . Alcohol use disorder, severe, in sustained remission ([place]) [F10.21] 02/09/2015  . Ureteral stone [N20.1] 12/31/2014  . Claudication of left lower extremity ([place]) [I73.9] 08/14/2012  . CAD s/p CABG 2012 [facility] [I25.810] 11/28/2011  . Hyperlipidemia LDL goal <100 [E78.5] 03/13/2007  . Essential hypertension [I10] 03/13/2007  . GERD [K21.9] 03/13/2007   Total Time spent with patient: 30 minutes  Past Psychiatric History:  Bipolar 1 mood disorder  Past Medical History:  Past Medical History  Diagnosis Date  . Coronary artery disease   . Hypertension   . Hypercholesteremia   . Kidney stone   . Tobacco use disorder   . Hx of CABG   . COPD (chronic obstructive pulmonary disease) ([place])   . Bipolar disorder (manic depression) ([place])   . Asthma     Past Surgical History  Procedure Laterality Date  . Coronary artery bypass graft    . Kidney stone surgery    . Appendectomy    . Lower extremity angiogram N/A 08/15/2012    Procedure: LOWER EXTREMITY ANGIOGRAM with possible PTA /stent;  Surgeon: Jettie Booze, MD;  Location: [facility] CATH LAB;  Service: Cardiovascular;  Laterality: N/A;  . Abdominal angiogram  08/15/2012    Procedure: ABDOMINAL ANGIOGRAM;  Surgeon: Jettie Booze, MD;  Location[facility] CATH LAB;  Service: Cardiovascular;;  . Cardioversion N/A 08/28/2012    Procedure: Pseudo Compression;  Surgeon: Angelia Mould, MD;  Location: [facility] CATH LAB;  Service: Cardiovascular;  Laterality: N/A;  . Cystoscopy with retrograde pyelogram, ureteroscopy and stent placement Right 12/31/2014    Procedure: CYSTOSCOPY  RIGHT URETEROSCOPY WITH STONE RETREIVAL RIGHT RETROGRADE PYELOGRAM;  Surgeon: Cleon Gustin, MD;  Location: [facility] ORS;  Service: Urology;  Laterality: Right;   Family History:  Family History  Problem Relation Age of Onset  . Heart disease Father   . Hyperlipidemia Father   . Heart disease Maternal Grandmother    Family Psychiatric  History:  denies Social History:  History  Alcohol Use  . Yes    Comment: last drink 03/07/2015      History  Drug Use  . Yes  . Special: Cocaine    Comment: 10/23//2016    Social History   Social History  . Marital Status: Divorced    Spouse Name: N/A  . Number of Children: N/A  . Years of Education: N/A   Social History Main Topics  . Smoking status: Current Every Day Smoker -- 1.00 packs/day for 31 years    Types: Cigarettes  . Smokeless tobacco: Former Systems developer    Quit date: 04/27/2013  . Alcohol Use: Yes  Comment: last drink 03/07/2015   . Drug Use: Yes    Special: Cocaine     Comment: 10/23//2016  . Sexual Activity: Yes    Birth Control/ Protection: Condom   Other Topics Concern  . Not on file   Social History Narrative   Additional Social History:   Sleep: Fair  Appetite:  Fair  Current Medications: Current Facility-Administered Medications  Medication Dose Route Frequency Provider Last Rate Last Dose  . acetaminophen (TYLENOL) tablet 650 mg  650 mg Oral Q6H PRN Benjamine Mola, [name]   650 mg at 04/24/15  9169  . albuterol (PROVENTIL HFA;VENTOLIN HFA) 108 (90 BASE) MCG/ACT inhaler 2 puff  2 puff Inhalation Q6H PRN Benjamine Mola, [name]      . alum & mag hydroxide-simeth (MAALOX/MYLANTA) 200-200-20 MG/5ML suspension 30 mL  30 mL Oral Q4H PRN Benjamine Mola, [name]      . atorvastatin (LIPITOR) tablet 20 mg  20 mg Oral q1800 Benjamine Mola, [name]   20 mg at 04/23/15 1816  . FLUoxetine (PROZAC) capsule 20 mg  20 mg Oral Daily Benjamine Mola, [name]   20 mg at 04/24/15 0813  . hydrOXYzine (ATARAX/VISTARIL) tablet 50 mg  50 mg Oral Q6H PRN Benjamine Mola, [name]   50 mg at 04/24/15 4503  . LORazepam (ATIVAN) tablet 1 mg  1 mg Oral Q8H PRN Benjamine Mola, [name]   1 mg at 04/24/15 8882  . lurasidone (LATUDA) tablet 40 mg  40 mg Oral Q breakfast Benjamine Mola, [name]   40 mg at 04/24/15 0813  . magnesium hydroxide (MILK OF MAGNESIA) suspension 30 mL  30 mL Oral Daily PRN Benjamine Mola, [name]      . metFORMIN (GLUCOPHAGE) tablet 500 mg  500 mg Oral Q breakfast Benjamine Mola, [name]   500 mg at 04/24/15 8003  . nicotine (NICODERM CQ - dosed in mg/24 hours) patch 21 mg  21 mg Transdermal Daily Kerrie Buffalo, NP   21 mg at 04/24/15 0647  . ondansetron (ZOFRAN) tablet 4 mg  4 mg Oral Q8H PRN Benjamine Mola, [name]      . pantoprazole (PROTONIX) EC tablet 40 mg  40 mg Oral Daily Benjamine Mola, [name]   40 mg at 04/24/15 0813  . traZODone (DESYREL) tablet 100 mg  100 mg Oral QHS PRN Benjamine Mola, [name]   100 mg at 04/23/15 2211    Lab Results:  Results for orders placed or performed during the hospital encounter of 04/23/15 (from the past 48 hour(s))  Comprehensive metabolic panel     Status: Abnormal   Collection Time: 04/23/15  9:20 AM  Result Value Ref Range   Sodium 139 135 - 145 mmol/L   Potassium 3.5 3.5 - 5.1 mmol/L   Chloride 103 101 - 111 mmol/L   CO2 26 22 - 32 mmol/L   Glucose, Bld 131 (H) 65 - 99 mg/dL   BUN 19 6 - 20 mg/dL   Creatinine, Ser 1.08 0.61 - 1.24 mg/dL   Calcium 9.6 8.9 - 10.3 mg/dL   Total Protein 8.1  6.5 - 8.1 g/dL   Albumin 4.5 3.5 - 5.0 g/dL   AST 26 15 - 41 U/L   ALT 35 17 - 63 U/L   Alkaline Phosphatase 88 38 - 126 U/L   Total Bilirubin 0.9 0.3 - 1.2 mg/dL   GFR calc non Af Amer >60 >60 mL/min   GFR calc Af Amer >60 >  60 mL/min    Comment: (NOTE) The eGFR has been calculated using the CKD EPI equation. This calculation has not been validated in all clinical situations. eGFR's persistently <60 mL/min signify possible Chronic Kidney Disease.    Anion gap 10 5 - 15  Ethanol (ETOH)     Status: None   Collection Time: 04/23/15  9:20 AM  Result Value Ref Range   Alcohol, Ethyl (B) <5 <5 mg/dL    Comment:        LOWEST DETECTABLE LIMIT FOR SERUM ALCOHOL IS 5 mg/dL FOR MEDICAL PURPOSES ONLY   Salicylate level     Status: None   Collection Time: 04/23/15  9:20 AM  Result Value Ref Range   Salicylate Lvl <0.4 2.8 - 30.0 mg/dL  Acetaminophen level     Status: Abnormal   Collection Time: 04/23/15  9:20 AM  Result Value Ref Range   Acetaminophen (Tylenol), Serum <10 (L) 10 - 30 ug/mL    Comment:        THERAPEUTIC CONCENTRATIONS VARY SIGNIFICANTLY. A RANGE OF 10-30 ug/mL MAY BE AN EFFECTIVE CONCENTRATION FOR MANY PATIENTS. HOWEVER, SOME ARE BEST TREATED AT CONCENTRATIONS OUTSIDE THIS RANGE. ACETAMINOPHEN CONCENTRATIONS >150 ug/mL AT 4 HOURS AFTER INGESTION AND >50 ug/mL AT 12 HOURS AFTER INGESTION ARE OFTEN ASSOCIATED WITH TOXIC REACTIONS.   CBC with Differential     Status: None   Collection Time: 04/23/15  9:20 AM  Result Value Ref Range   WBC 7.6 4.0 - 10.5 K/uL   RBC 4.73 4.22 - 5.81 MIL/uL   Hemoglobin 15.7 13.0 - 17.0 g/dL   HCT 44.8 39.0 - 52.0 %   MCV 94.7 78.0 - 100.0 fL   MCH 33.2 26.0 - 34.0 pg   MCHC 35.0 30.0 - 36.0 g/dL   RDW 13.0 11.5 - 15.5 %   Platelets 367 150 - 400 K/uL   Neutrophils Relative % 68 %   Neutro Abs 5.2 1.7 - 7.7 K/uL   Lymphocytes Relative 19 %   Lymphs Abs 1.4 0.7 - 4.0 K/uL   Monocytes Relative 7 %   Monocytes Absolute 0.5  0.1 - 1.0 K/uL   Eosinophils Relative 6 %   Eosinophils Absolute 0.5 0.0 - 0.7 K/uL   Basophils Relative 0 %   Basophils Absolute 0.0 0.0 - 0.1 K/uL  Urine rapid drug screen (hosp performed) (Not at [facility])     Status: Abnormal   Collection Time: 04/23/15  9:47 AM  Result Value Ref Range   Opiates NONE DETECTED NONE DETECTED   Cocaine POSITIVE (A) NONE DETECTED   Benzodiazepines NONE DETECTED NONE DETECTED   Amphetamines NONE DETECTED NONE DETECTED   Tetrahydrocannabinol NONE DETECTED NONE DETECTED   Barbiturates NONE DETECTED NONE DETECTED    Comment:        DRUG SCREEN FOR MEDICAL PURPOSES ONLY.  IF CONFIRMATION IS NEEDED FOR ANY PURPOSE, NOTIFY LAB WITHIN 5 DAYS.        LOWEST DETECTABLE LIMITS FOR URINE DRUG SCREEN Drug Class       Cutoff (ng/mL) Amphetamine      1000 Barbiturate      200 Benzodiazepine   599 Tricyclics       774 Opiates          300 Cocaine          300 THC              50     Physical Findings: AIMS: Facial and Oral Movements Muscles of Facial  Expression: None, normal Lips and Perioral Area: None, normal Jaw: None, normal Tongue: None, normal,Extremity Movements Upper (arms, wrists, hands, fingers): None, normal Lower (legs, knees, ankles, toes): None, normal, Trunk Movements Neck, shoulders, hips: None, normal, Overall Severity Severity of abnormal movements (highest score from questions above): None, normal Incapacitation due to abnormal movements: None, normal Patient's awareness of abnormal movements (rate only patient's report): No Awareness, Dental Status Current problems with teeth and/or dentures?: No Does patient usually wear dentures?: No  CIWA:    COWS:     Musculoskeletal: Strength & Muscle Tone: within normal limits Gait & Station: normal Patient leans: N/A  Psychiatric Specialty Exam: Review of Systems  All other systems reviewed and are negative.   Blood pressure 130/85, pulse 85, temperature 97.5 F (36.4 C), temperature  source Oral, resp. rate 18, height $RemoveBe'5\' 10"'bjXdvItdW$  (1.778 m), weight 97.523 kg (215 lb), SpO2 97 %.Body mass index is 30.85 kg/(m^2).  General Appearance: Neat  Eye Contact::  Fair  Speech:  Clear and Coherent  Volume:  Decreased  Mood:  Depressed  Affect:  Depressed and Flat  Thought Process:  Circumstantial  Orientation:  Full (Time, Place, and Person)  Thought Content:  Rumination  Suicidal Thoughts:  No  Homicidal Thoughts:  No  Memory:  Immediate;   Fair Recent;   Fair Remote;   Fair  Judgement:  Fair  Insight:  Fair  Psychomotor Activity:  Normal  Concentration:  Fair  Recall:  [employer] of Knowledge:Fair  Language: Fair  Akathisia:  Negative  Handed:  Right  AIMS (if indicated):     Assets:  Resilience  ADL's:  Intact  Cognition: WNL  Sleep:  fair   Treatment Plan Summary: Daily contact with patient to assess and evaluate symptoms and progress in treatment, Medication management and Plan seek placement for long term care for substance abuse  Freda Munro May Hermen Mario AGNP-BC 04/24/2015, 12:21 PM

## 2015-04-24 NOTE — Progress Notes (Signed)
Patient seen resting at the time of shift change. Appear sad and depressed. Denies pain, SI, AH/VH at this time. Requested for a sleeping pill. Minimal conversation. No new complaint. Due/PRN medications given as ordered. Continuous observation.

## 2015-04-24 NOTE — H&P (Signed)
Observation Admission Assessment Adult  Patient Identification: Steven [name] MRN:  [id number] Date of Evaluation:  04/24/2015 Chief Complaint:  MDD Principal Diagnosis: <principal problem not specified> Diagnosis:   Patient Active Problem List   Diagnosis Date Noted  . Cocaine use disorder, moderate, dependence ([place]) [F14.20]   . Alcohol use disorder, moderate, dependence ([place]) [F10.20] 04/02/2015  . Bipolar 1 disorder, depressed ([place]) [F31.9] 04/02/2015  . Alcohol abuse [F10.10] 03/08/2015  . Cocaine abuse [F14.10] 03/08/2015  . Substance induced mood disorder ([place]) [F19.94] 03/08/2015  . Suicidal ideation [R45.851]   . COPD (chronic obstructive pulmonary disease) ([place]) [J44.9] 02/09/2015  . Tobacco use disorder [F17.200] 02/09/2015  . Stimulant use disorder (cocaine) [F15.90] 02/09/2015  . Alcohol use disorder, severe, in sustained remission ([place]) [F10.21] 02/09/2015  . Ureteral stone [N20.1] 12/31/2014  . Claudication of left lower extremity ([place]) [I73.9] 08/14/2012  . CAD s/p CABG 2012 [facility] [I25.810] 11/28/2011  . Hyperlipidemia LDL goal <100 [E78.5] 03/13/2007  . Essential hypertension [I10] 03/13/2007  . GERD [K21.9] 03/13/2007   Subjective:  Steven [name] is an 44 y.o. Male who presents voluntarily for suicidal ideations and admitted to OBS unit for observation and stabilization.  History of Present Illness::Steven [name] is an 44 y.o. male who presents voluntarily for suicidal ideations stating "I just don't want to live another day." Patient states that he drove himself to the ED due to increasing SI with a plan to wreck his motorcycle or overdose on heroin. Patient states that he has outpatient treatment with [facility] and his next therapy appointment is 12/12 and his next psychiatric appointment is 12/16. Patient states that he is increasingly depressed due to finishing his GED is September of 2015 and still not being able to find work.  Patient states that "each day that goes by that I don't get a call back is worse and worse." Patient states that he has one previous suicide attempt by cutting his wrist "a few years ago" when going through a divorce" Patient states that he was admitted to [facility] on 04/02/2015 and 04/13/2015. Patient states that he [facility] up with [facility]. Patient denies other hospitalizations. Patient denies self injurious behaviors at this time. Patient denies HI or history of being violent towards others. Patient states that he is currently on probation for shoplifting. Patient denies pending charges or upcoming court dates. Patient does not appear to be responding to internal stimuli. Patient states that his sleeping has decreased and his appetite is okay. Patient endorses symptoms of depression as; Insomnia;Tearfulness;Isolating;Fatigue;Loss of interest in usual pleasures;Feeling worthless/self pity, and hopelessness. Patient cannot contract for safety at this time. Patient states that he has been "clean for 18 months" after attending AA, however, tested positive for cocaine. Patient states that he may have did "a little line" when he was upset a few days ago. Patient denies other substance abuse at this time. Patient ETOH <5 at this time.   Patient states that he came into the ED "to keep me from hurting myself." Patient states that he was previously on Jenkins "and I was doing fine so maybe I just need to try a different medication. "  OBS Unit: Marcy Panning interviewed and records reviewed.  Patient continues to verbalize suicidal ideations stating "I rather kill my self than waking up this way every morning in pain.  I am tired of living this way.  I want to die".  He reports that he  did not go forward with suicidal plans because he promised  a lady at [facility] that he will go to the hospital if he has a strong SI.  He reports  this recent SI is because he could not be seen  for counseling at [facility] till 04/26/15 and a later appointment for his medications.  He reports he ran out of some of his medications and does not have any means of getting them.   He reports  [employer] worked for a while but is no longer working for him; his depression, agitation and mood swings are worse now than they were in May.   He reports he told himself that if he gets to the point where he is in and out of sobriety , he will kill himself.  He states he was sober and clean for 18 months before he relapsed and hospitalized in November, 2016.  After discharge, he tried to stay sober again till 3 or 4 days ago when he did a little cocaine.  He reports he does not know why he did the cocaine because alcohol or drugs no longer make him feel good.     Patient is alert, fully oriented x4.  He denies HI/AVH.  He does not appear to be responding to internal stimuli.  He states he is divorced after 92 years of marriage and is now homeless.  He states he is unemployed and looking for employment in a different line of work.  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  He reports frustration with inability to find employment even after getting his GED.  He reports unemployment/money is an added stressor.  He states he is on probation for stealing $11.00 of food items from [place].  He reports his parents live in [place] and because he is on probation can not move to [place]. He continues to state that there is no reason for him to continue to live.  He states it is better for him to leave this world and would be happier if that could happen sooner than later.  He desires to be admitted inpatient to stop him from feeling this way.  Patient meets criteria for psychiatric inpatient.     Associated Signs/Symptoms: Depression Symptoms:  depressed  mood, insomnia, psychomotor agitation, fatigue, feelings of worthlessness/guilt, difficulty concentrating, hopelessness, suicidal thoughts without plan, (Hypo) Manic Symptoms:  Irritable Mood, Anxiety Symptoms:  None reported Psychotic Symptoms:  None reported PTSD Symptoms: None reported Total Time spent with patient: 30 minutes  Past Psychiatric History: See HPI  Risk to Self:   Risk to Others:   Prior Inpatient Therapy:   Prior Outpatient Therapy:    Alcohol Screening:   Substance Abuse History in the last 12 months:  Yes.   Consequences of Substance Abuse: Medical Consequences:  None reported  Legal Consequences:  Probation Family Consequences:  Reports none Previous Psychotropic Medications: Yes  Psychological Evaluations: No  Past Medical History:  Past Medical History  Diagnosis Date  . Coronary artery disease   . Hypertension   . Hypercholesteremia   . Kidney stone   . Tobacco use disorder   . Hx of CABG   . COPD (chronic obstructive pulmonary disease) ([place])   . Bipolar disorder (manic depression) ([place])   . Asthma     Past Surgical History  Procedure Laterality Date  . Coronary artery bypass graft    . Kidney stone surgery    . Appendectomy    . Lower extremity angiogram N/A 08/15/2012    Procedure: LOWER EXTREMITY ANGIOGRAM with possible PTA /stent;  Surgeon: Jettie Booze, MD;  Location: [facility] CATH LAB;  Service: Cardiovascular;  Laterality: N/A;  . Abdominal angiogram  08/15/2012    Procedure: ABDOMINAL ANGIOGRAM;  Surgeon: Jettie Booze, MD;  Location: [facility] CATH LAB;  Service: Cardiovascular;;  . Cardioversion N/A 08/28/2012    Procedure: Pseudo Compression;  Surgeon: Angelia Mould, MD;  Location: [facility] CATH LAB;  Service: Cardiovascular;  Laterality: N/A;  . Cystoscopy with retrograde pyelogram, ureteroscopy and stent placement Right 12/31/2014    Procedure: CYSTOSCOPY  RIGHT URETEROSCOPY WITH STONE RETREIVAL RIGHT RETROGRADE PYELOGRAM;   Surgeon: Cleon Gustin, MD;  Location: [facility] ORS;  Service: Urology;  Laterality: Right;   Family History:  Family History  Problem Relation Age of Onset  . Heart disease Father   . Hyperlipidemia Father   . Heart disease Maternal Grandmother    Family Psychiatric  History: See HPI Social History: HPI History  Alcohol Use  . Yes    Comment: last drink 03/07/2015      History  Drug Use  . Yes  . Special: Cocaine    Comment: 10/23//2016    Social History   Social History  . Marital Status: Divorced    Spouse Name: N/A  . Number of Children: N/A  . Years of Education: N/A   Social History Main Topics  . Smoking status: Current Every Day Smoker -- 1.00 packs/day for 31 years    Types: Cigarettes  . Smokeless tobacco: Former Systems developer    Quit date: 04/27/2013  . Alcohol Use: Yes     Comment: last drink 03/07/2015   . Drug Use: Yes    Special: Cocaine     Comment: 10/23//2016  . Sexual Activity: Yes    Birth Control/ Protection: Condom   Other Topics Concern  . Not on file   Social History Narrative   Additional Social History:                         Allergies:  No Known Allergies Lab Results:  Results for orders placed or performed during the hospital encounter of 04/23/15 (from the past 48 hour(s))  Comprehensive metabolic panel     Status: Abnormal   Collection Time: 04/23/15  9:20 AM  Result Value Ref Range   Sodium 139 135 - 145 mmol/L   Potassium 3.5 3.5 - 5.1 mmol/L   Chloride 103 101 - 111 mmol/L   CO2 26 22 - 32 mmol/L   Glucose, Bld 131 (H) 65 - 99 mg/dL   BUN 19 6 - 20 mg/dL   Creatinine, Ser 1.08 0.61 - 1.24 mg/dL   Calcium 9.6 8.9 - 10.3 mg/dL   Total Protein 8.1 6.5 - 8.1 g/dL   Albumin 4.5 3.5 - 5.0 g/dL   AST 26 15 - 41 U/L   ALT 35 17 - 63 U/L   Alkaline Phosphatase 88 38 - 126 U/L   Total Bilirubin 0.9 0.3 - 1.2 mg/dL   GFR calc non Af Amer >60 >60 mL/min   GFR calc Af Amer >60 >60 mL/min    Comment: (NOTE) The eGFR has  been calculated using the CKD EPI equation. This calculation has not been validated in all clinical situations. eGFR's persistently <  60 mL/min signify possible Chronic Kidney Disease.    Anion gap 10 5 - 15  Ethanol (ETOH)     Status: None   Collection Time: 04/23/15  9:20 AM  Result Value Ref Range   Alcohol, Ethyl (B) <5 <5 mg/dL    Comment:        LOWEST DETECTABLE LIMIT FOR SERUM ALCOHOL IS 5 mg/dL FOR MEDICAL PURPOSES ONLY   Salicylate level     Status: None   Collection Time: 04/23/15  9:20 AM  Result Value Ref Range   Salicylate Lvl <4.0 2.8 - 30.0 mg/dL  Acetaminophen level     Status: Abnormal   Collection Time: 04/23/15  9:20 AM  Result Value Ref Range   Acetaminophen (Tylenol), Serum <10 (L) 10 - 30 ug/mL    Comment:        THERAPEUTIC CONCENTRATIONS VARY SIGNIFICANTLY. A RANGE OF 10-30 ug/mL MAY BE AN EFFECTIVE CONCENTRATION FOR MANY PATIENTS. HOWEVER, SOME ARE BEST TREATED AT CONCENTRATIONS OUTSIDE THIS RANGE. ACETAMINOPHEN CONCENTRATIONS >150 ug/mL AT 4 HOURS AFTER INGESTION AND >50 ug/mL AT 12 HOURS AFTER INGESTION ARE OFTEN ASSOCIATED WITH TOXIC REACTIONS.   CBC with Differential     Status: None   Collection Time: 04/23/15  9:20 AM  Result Value Ref Range   WBC 7.6 4.0 - 10.5 K/uL   RBC 4.73 4.22 - 5.81 MIL/uL   Hemoglobin 15.7 13.0 - 17.0 g/dL   HCT 79.3 59.6 - 02.5 %   MCV 94.7 78.0 - 100.0 fL   MCH 33.2 26.0 - 34.0 pg   MCHC 35.0 30.0 - 36.0 g/dL   RDW 26.6 32.7 - 71.8 %   Platelets 367 150 - 400 K/uL   Neutrophils Relative % 68 %   Neutro Abs 5.2 1.7 - 7.7 K/uL   Lymphocytes Relative 19 %   Lymphs Abs 1.4 0.7 - 4.0 K/uL   Monocytes Relative 7 %   Monocytes Absolute 0.5 0.1 - 1.0 K/uL   Eosinophils Relative 6 %   Eosinophils Absolute 0.5 0.0 - 0.7 K/uL   Basophils Relative 0 %   Basophils Absolute 0.0 0.0 - 0.1 K/uL  Urine rapid drug screen (hosp performed) (Not at [facility])     Status: Abnormal   Collection Time: 04/23/15  9:47 AM  Result  Value Ref Range   Opiates NONE DETECTED NONE DETECTED   Cocaine POSITIVE (A) NONE DETECTED   Benzodiazepines NONE DETECTED NONE DETECTED   Amphetamines NONE DETECTED NONE DETECTED   Tetrahydrocannabinol NONE DETECTED NONE DETECTED   Barbiturates NONE DETECTED NONE DETECTED    Comment:        DRUG SCREEN FOR MEDICAL PURPOSES ONLY.  IF CONFIRMATION IS NEEDED FOR ANY PURPOSE, NOTIFY LAB WITHIN 5 DAYS.        LOWEST DETECTABLE LIMITS FOR URINE DRUG SCREEN Drug Class       Cutoff (ng/mL) Amphetamine      1000 Barbiturate      200 Benzodiazepine   200 Tricyclics       300 Opiates          300 Cocaine          300 THC              50     Metabolic Disorder Labs:  Lab Results  Component Value Date   HGBA1C 6.5* 04/06/2015   MPG 140 04/06/2015   MPG 97 04/27/2013   Lab Results  Component Value Date   PROLACTIN 18.2* 04/06/2015  Lab Results  Component Value Date   CHOL 284* 04/06/2015   TRIG 378* 04/06/2015   HDL 43 04/06/2015   CHOLHDL 6.6 04/06/2015   VLDL 76* 04/06/2015   LDLCALC 165* 04/06/2015   LDLCALC 114* 02/10/2015    Current Medications: Current Facility-Administered Medications  Medication Dose Route Frequency Provider Last Rate Last Dose  . acetaminophen (TYLENOL) tablet 650 mg  650 mg Oral Q6H PRN Benjamine Mola, [name]      . albuterol (PROVENTIL HFA;VENTOLIN HFA) 108 (90 BASE) MCG/ACT inhaler 2 puff  2 puff Inhalation Q6H PRN Benjamine Mola, [name]      . alum & mag hydroxide-simeth (MAALOX/MYLANTA) 200-200-20 MG/5ML suspension 30 mL  30 mL Oral Q4H PRN Benjamine Mola, [name]      . atorvastatin (LIPITOR) tablet 20 mg  20 mg Oral q1800 Benjamine Mola, [name]   20 mg at 04/23/15 1816  . FLUoxetine (PROZAC) capsule 20 mg  20 mg Oral Daily Benjamine Mola, [name]      . hydrOXYzine (ATARAX/VISTARIL) tablet 50 mg  50 mg Oral Q6H PRN Benjamine Mola, [name]      . LORazepam (ATIVAN) tablet 1 mg  1 mg Oral Q8H PRN Benjamine Mola, [name]      . lurasidone (LATUDA) tablet 40 mg  40  mg Oral Q breakfast Benjamine Mola, [name]      . magnesium hydroxide (MILK OF MAGNESIA) suspension 30 mL  30 mL Oral Daily PRN Benjamine Mola, [name]      . metFORMIN (GLUCOPHAGE) tablet 500 mg  500 mg Oral Q breakfast Benjamine Mola, [name]      . ondansetron (ZOFRAN) tablet 4 mg  4 mg Oral Q8H PRN Benjamine Mola, [name]      . pantoprazole (PROTONIX) EC tablet 40 mg  40 mg Oral Daily Benjamine Mola, [name]      . traZODone (DESYREL) tablet 100 mg  100 mg Oral QHS PRN Benjamine Mola, [name]   100 mg at 04/23/15 2211   PTA Medications: Prescriptions prior to admission  Medication Sig Dispense Refill Last Dose  . albuterol (PROVENTIL HFA;VENTOLIN HFA) 108 (90 BASE) MCG/ACT inhaler Inhale 2 puffs into the lungs every 6 (six) hours as needed for wheezing or shortness of breath. 1 Inhaler 0 Past Month at Unknown time  . atorvastatin (LIPITOR) 20 MG tablet Take 1 tablet (20 mg total) by mouth daily at 6 PM. 30 tablet 0 04/22/2015 at Unknown time  . FLUoxetine (PROZAC) 20 MG capsule Take 1 capsule (20 mg total) by mouth daily. 30 capsule 0 04/22/2015 at Unknown time  . hydrOXYzine (ATARAX/VISTARIL) 50 MG tablet Take 1 tablet (50 mg total) by mouth every 6 (six) hours as needed for anxiety. 30 tablet 0 Past Week at Unknown time  . lurasidone (LATUDA) 40 MG TABS tablet Take 1 tablet (40 mg total) by mouth daily with breakfast. 30 tablet 0 04/22/2015 at Unknown time  . metFORMIN (GLUCOPHAGE) 500 MG tablet Take 1 tablet (500 mg total) by mouth daily with breakfast. 30 tablet 0 04/22/2015 at Unknown time  . mometasone-formoterol (DULERA) 100-5 MCG/ACT AERO Inhale 2 puffs into the lungs 2 (two) times daily. (Patient not taking: Reported on 04/01/2015) 1 Inhaler 0 Not Taking at Unknown time  . pantoprazole (PROTONIX) 40 MG tablet Take 1 tablet (40 mg total) by mouth daily. 30 tablet 0 04/22/2015 at Unknown time  . traZODone (DESYREL) 100 MG tablet Take 1 tablet (100 mg  total) by mouth at bedtime as needed for sleep. 20 tablet 0 Past  Month at Unknown time    Musculoskeletal: Strength & Muscle Tone: within normal limits Gait & Station: normal Patient leans: Right  Psychiatric Specialty Exam: Physical Exam  ROS  Pulse 93, temperature 98.5 F (36.9 C), temperature source Oral, resp. rate 20, height $RemoveBe'5\' 10"'iNTWIikqa$  (1.778 m), weight 97.523 kg (215 lb), SpO2 97 %.Body mass index is 30.85 kg/(m^2).  General Appearance: Fairly Groomed  Engineer, water::  Good  Speech:  Clear and Coherent and Normal Rate  Volume:  Normal  Mood:  Euthymic  Affect:  Appropriate and Congruent  Thought Process:  Coherent, Goal Directed and Logical  Orientation:  Full (Time, Place, and Person)  Thought Content:  WDL  Suicidal Thoughts:  Yes.  with intent/plan  Homicidal Thoughts:  No  Memory:  Immediate;   Good Recent;   Good Remote;   Good  Judgement:  Fair  Insight:  Fair  Psychomotor Activity:  Normal  Concentration:  Good  Recall:  Good  Fund of Knowledge:Good  Language: Good  Akathisia:  Negative  Handed:  Right  AIMS (if indicated):     Assets:  Communication Skills Desire for Improvement Resilience  ADL's:  Intact  Cognition: WNL  Sleep:   4-6 hours     Observation Level/Precautions:  Continuous Observation  Laboratory:  Per EDP  Psychotherapy:  As needed  Medications:  Per Med list  Consultations:  As necessary  Discharge Concerns:  Safety  Estimated LOS:  OBS  Other:     Treatment Plan Summary: Daily contact with patient to assess and evaluate symptoms and progress in treatment, Medication management  Meets criteria for admission to inpatient psychiatry Medication adjustment; states [employer] no longer working Psycho education  Disposition: Admit to Psychiatric inpatient    Samantha Crimes, [place] [facility] 12/10/20166:19 AM

## 2015-04-25 MED ORDER — LURASIDONE HCL 40 MG PO TABS: 40.0000 mg | ORAL_TABLET | Freq: Every day | ORAL | Status: DC

## 2015-04-25 MED ORDER — HYDROXYZINE HCL 50 MG PO TABS: 50.0000 mg | ORAL_TABLET | Freq: Four times a day (QID) | ORAL | Status: DC | PRN

## 2015-04-25 MED ORDER — ATORVASTATIN CALCIUM 20 MG PO TABS: 20.0000 mg | ORAL_TABLET | Freq: Every day | ORAL | Status: DC

## 2015-04-25 MED ORDER — FLUOXETINE HCL 20 MG PO CAPS: 20.0000 mg | ORAL_CAPSULE | Freq: Every day | ORAL | Status: DC

## 2015-04-25 MED ORDER — METFORMIN HCL 500 MG PO TABS: 500.0000 mg | ORAL_TABLET | Freq: Every day | ORAL | Status: DC

## 2015-04-25 MED ORDER — TRAZODONE HCL 100 MG PO TABS: 100.0000 mg | ORAL_TABLET | Freq: Every evening | ORAL | Status: DC | PRN

## 2015-04-25 NOTE — Clinical Social Work Note (Signed)
CSW met with pt individually. Pt agitated, stating, "if you all kick me out today I am gonna kill myself. I am pissed and don't care about my life anymore." Pt states that he has appt with [facility] (therapist) on Tuesday and reports that he does not know for sure when he will be seeing his psychiatrist. CSW asked pt what he thinks will help him by staying at [facility]; "I feel safe here and can get my meds." Pt is not able/willing to problem solve with CSW about options for safe temporary housing (IRC/shelters, etc) "I'm not living like that anymore. I'd rather die."    Smart, MSW, LCSW Clinical Social Worker 04/25/2015 9:06 AM   

## 2015-04-25 NOTE — Progress Notes (Signed)
Patient ID: Steven ChangRoger L [name], male   DOB: 11/08/1970, 44 y.o.   MRN: [id number] Requested Ativan this am with am med pass stating he was anxious and felt terrible. Hour and half later requested prn Vistaril for continued anxiety. [facility] because he is not wanting to be discharged today. States he has no where to live, will be violated by his parole officer if they find out he is homeless and cant go to his fathers house in [place] because he would then be out of state from his probation and that is not allowed. He is endorsing feelings of giving up and is making passive threats to kill self. Chief problem seems to be homelessness. Counselor discussing options with him. He then called his father who agree to wire him money for housing. Looking into boarding homes. Also, AC is trying to get him into [facility] for further stabilization. He states he is now ready to leave.

## 2015-04-25 NOTE — Progress Notes (Signed)
Patient ID: Steven ChangRoger L [name], male   DOB: 11/15/1970, 44 y.o.   MRN: [id number] Discharge Note-Seen by May NP this am and she has determined he can be discharged today. He is unhappy about it initially stating he needs help, but after speaking with his father and his father wiring him money he is fine and ready to go. [facility] and he will be looking for a boarding house with the money he is getting from his father. Reviewed discharge instructions with him and he verbalized his understanding. He denies now any thoughts to hurt self or others. All his property was returned to him. He was given prescriptions for his medications to fill once discharged. Escorted to lobby for discharge.

## 2015-04-25 NOTE — Discharge Summary (Signed)
Physician Discharge Summary Note  Patient:  Steven [name] is an 44 y.o., male MRN:  [id number] DOB:  02/19/1971 Patient phone:  [phone] (home)  Patient address:   [address] [place] [address],  Total Time spent with patient: 45 minutes  Date of Admission:  04/23/2015 Date of Discharge: 04/25/2015  Reason for Admission:  ETOH abuse  Principal Problem: Substance induced mood disorder [facility]) Discharge Diagnoses: Patient Active Problem List   Diagnosis Date Noted  . Substance induced mood disorder (HCC) [F19.94] 03/08/2015    Priority: High  . Cocaine use disorder, moderate, dependence (HCC) [F14.20]   . Alcohol use disorder, moderate, dependence (HCC) [F10.20] 04/02/2015  . Bipolar 1 disorder, depressed (HCC) [F31.9] 04/02/2015  . Alcohol abuse [F10.10] 03/08/2015  . Cocaine abuse [F14.10] 03/08/2015  . Suicidal ideation [R45.851]   . COPD (chronic obstructive pulmonary disease) (HCC) [J44.9] 02/09/2015  . Tobacco use disorder [F17.200] 02/09/2015  . Stimulant use disorder (cocaine) [F15.90] 02/09/2015  . Alcohol use disorder, severe, in sustained remission (HCC) [F10.21] 02/09/2015  . Ureteral stone [N20.1] 12/31/2014  . 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 CAD s/p CABG 2012 [facility] [I25.810] 11/28/2011  . Hyperlipidemia LDL goal <100 [E78.5] 03/13/2007  . Essential hypertension [I10] 03/13/2007  . GERD [K21.9] 03/13/2007    Past Psychiatric History: Denies  Past Medical History:  Past Medical History  Diagnosis Date  . Coronary artery disease   . Hypertension   . Hypercholesteremia   . Kidney stone   . Tobacco use disorder   . Hx of CABG   . COPD (chronic obstructive pulmonary disease) (HCC)   . Bipolar disorder (manic depression) (HCC)   . Asthma     Past Surgical History  Procedure Laterality Date  . Coronary artery bypass graft    . Kidney stone surgery    . Appendectomy    . Lower extremity  angiogram N/A 08/15/2012    Procedure: LOWER EXTREMITY ANGIOGRAM with possible PTA /stent;  Surgeon: Corky Crafts, MD;  Location: [facility] CATH LAB;  Service: Cardiovascular;  Laterality: N/A;  . Abdominal angiogram  08/15/2012    Procedure: ABDOMINAL ANGIOGRAM;  Surgeon: Corky Crafts, MD;  Location: [facility] CATH LAB;  Service: Cardiovascular;;  . Cardioversion N/A 08/28/2012    Procedure: Pseudo Compression;  Surgeon: Chuck Hint, MD;  Location: [facility] CATH LAB;  Service: Cardiovascular;  Laterality: N/A;  . Cystoscopy with retrograde pyelogram, ureteroscopy and stent placement Right 12/31/2014    Procedure: CYSTOSCOPY  RIGHT URETEROSCOPY WITH STONE RETREIVAL RIGHT RETROGRADE PYELOGRAM;  Surgeon: Malen Gauze, MD;  Location: [facility] ORS;  Service: Urology;  Laterality: Right;   Family History:  Family History  Problem Relation Age of Onset  . Heart disease Father   . Hyperlipidemia Father   . Heart disease Maternal Grandmother    Family Psychiatric  History:  Denies Social History:  History  Alcohol Use  . Yes    Comment: last drink 03/07/2015      History  Drug Use  . Yes  . Special: Cocaine    Comment: 10/23//2016    Social History   Social History  . Marital Status: Divorced    Spouse Name: N/A  . Number of Children: N/A  . Years of Education: N/A   Social History Main Topics  . Smoking status: Current Every Day Smoker -- 1.00 packs/day for 31 years    Types: Cigarettes  . Smokeless tobacco: Former Neurosurgeon  Quit date: 04/27/2013  . Alcohol Use: Yes     Comment: last drink 03/07/2015   . Drug Use: Yes    Special: Cocaine     Comment: 10/23//2016  . Sexual Activity: Yes    Birth Control/ Protection: Condom   Other Topics Concern  . Not on file   Social History Narrative    Hospital Course:  Steven [name] is an 44 y.o. male who presents voluntarily for suicidal ideations stating "I just don't want to live another day." Patient states that he drove himself  to the ED due to increasing SI with a plan to wreck his motorcycle or overdose on heroin. Patient states that he has outpatient treatment with [facility] of the [place] and his next therapy appointment is 12/12 and his next psychiatric appointment is 12/16 which is too long of a wait.  Patient states that he is increasingly depressed due to finishing his GED is September of 2015 and still not being able to find work.  He also reported that he is on probation for a felony.  Patient denies other hospitalizations, yet he has several admissions at [facility], [facility] and [facility].   Steven [name] was admitted for Substance induced mood disorder [facility]) and crisis management. He was treated with the following medications listed below.  Steven [name] was discharged with current medication and was instructed on how to take medications as prescribed; (details listed below under Medication List).  Medical problems were identified and treated as needed.  Home medications were restarted as appropriate.  Improvement was monitored by observation and Steven [name] daily report of symptom reduction.  Emotional and mental status was monitored by daily self-inventory reports completed by Steven [name] and clinical staff.         Steven [name] was evaluated by the treatment team for stability and plans for continued recovery upon discharge.  Steven [name] motivation was an integral factor for scheduling further treatment.  Employment, transportation, bed availability, health status, family support, and any pending legal issues were also considered during his hospital stay.  He was offered further treatment options upon discharge including but not limited to Residential, Intensive Outpatient, and Outpatient treatment.  Steven [name] will follow up with the services as listed below under Follow Up Information.     Upon completion of this admission the Steven [name] was both mentally and medically stable for discharge denying  suicidal/homicidal ideation, auditory/visual/tactile hallucinations, delusional thoughts and paranoia.     Physical Findings: AIMS: Facial and Oral Movements Muscles of Facial Expression: None, normal Lips and Perioral Area: None, normal Jaw: None, normal Tongue: None, normal,Extremity Movements Upper (arms, wrists, hands, fingers): None, normal Lower (legs, knees, ankles, toes): None, normal, Trunk Movements Neck, shoulders, hips: None, normal, Overall Severity Severity of abnormal movements (highest score from questions above): None, normal Incapacitation due to abnormal movements: None, normal Patient's awareness of abnormal movements (rate only patient's report): No Awareness, Dental Status Current problems with teeth and/or dentures?: No Does patient usually wear dentures?: No  CIWA:    COWS:     Musculoskeletal: Strength & Muscle Tone: within normal limits Gait & Station: normal Patient leans: N/A  Psychiatric Specialty Exam: Review of Systems  All other systems reviewed and are negative.   Blood pressure 134/86, pulse 97, temperature 98.2 F (36.8 C), temperature source Oral, resp. rate 16, height 5\' 10"  (1.778 m), weight 97.523 kg (215 lb), SpO2 96 %.Body mass index is 30.85  kg/(m^2).   General Appearance: Fairly Groomed  Patent attorneyye Contact:: Good  Speech: Clear and Coherent and Normal Rate  Volume: Normal  Mood: Euthymic  Affect: Appropriate and Congruent  Thought Process: Coherent, Goal Directed and Logical  Orientation: Full (Time, Place, and Person)  Thought Content: WDL  Suicidal Thoughts: Yes. with intent/plan  Homicidal Thoughts: No  Memory: Immediate; Good Recent; Good Remote; Good  Judgement: Fair  Insight: Fair  Psychomotor Activity: Normal  Concentration: Good  Recall: Good  Fund of Knowledge:Good  Language: Good  Akathisia: Negative  Handed: Right  AIMS (if indicated):    Assets: Communication  Skills Desire for Improvement Resilience  ADL's: Intact  Cognition: WNL  Sleep:  4-6 hours       Has this patient used any form of tobacco in the last 30 days? (Cigarettes, Smokeless Tobacco, Cigars, and/or Pipes) Yes, N/A  Metabolic Disorder Labs:  Lab Results  Component Value Date   HGBA1C 6.5* 04/06/2015   MPG 140 04/06/2015   MPG 97 04/27/2013   Lab Results  Component Value Date   PROLACTIN 18.2* 04/06/2015   Lab Results  Component Value Date   CHOL 284* 04/06/2015   TRIG 378* 04/06/2015   HDL 43 04/06/2015   CHOLHDL 6.6 04/06/2015   VLDL 76* 04/06/2015   LDLCALC 165* 04/06/2015   LDLCALC 114* 02/10/2015    See Psychiatric Specialty Exam and Suicide Risk Assessment completed by Attending Physician prior to discharge.  Discharge destination:  Home  Is patient on multiple antipsychotic therapies at discharge:  No   Has Patient had three or more failed trials of antipsychotic monotherapy by history:  No  Recommended Plan for Multiple Antipsychotic Therapies: NA    Medication List    STOP taking these medications        albuterol 108 (90 BASE) MCG/ACT inhaler  Commonly known as:  PROVENTIL HFA;VENTOLIN HFA     mometasone-formoterol 100-5 MCG/ACT Aero  Commonly known as:  DULERA     pantoprazole 40 MG tablet  Commonly known as:  PROTONIX      TAKE these medications      Indication   atorvastatin 20 MG tablet  Commonly known as:  LIPITOR  Take 1 tablet (20 mg total) by mouth daily at 6 PM.   Indication:  hyperlipidemia     FLUoxetine 20 MG capsule  Commonly known as:  PROZAC  Take 1 capsule (20 mg total) by mouth daily.   Indication:  Depression     hydrOXYzine 50 MG tablet  Commonly known as:  ATARAX/VISTARIL  Take 1 tablet (50 mg total) by mouth every 6 (six) hours as needed for anxiety.   Indication:  Anxiety Neurosis, Sedation, Tension     lurasidone 40 MG Tabs tablet  Commonly known as:  LATUDA  Take 1 tablet (40 mg total) by mouth  daily with breakfast.   Indication:  mood stabilization     metFORMIN 500 MG tablet  Commonly known as:  GLUCOPHAGE  Take 1 tablet (500 mg total) by mouth daily with breakfast.   Indication:  Type 2 Diabetes     traZODone 100 MG tablet  Commonly known as:  DESYREL  Take 1 tablet (100 mg total) by mouth at bedtime as needed for sleep.   Indication:  Trouble Sleeping        Follow-up recommendations:  Activity:  as tol Diet:  as tol  Comments:  1.  Take all your medications as prescribed.  2.  Report any adverse side effects to outpatient provider.                       3.  Patient instructed to not use alcohol or illegal drugs while on prescription medicines.            4.  In the event of worsening symptoms, instructed patient to call 911, the crisis hotline or go to nearest emergency room for evaluation of symptoms.  Signed: Velna Hatchet May Agustin AGNP-BC 04/25/2015, 2:24 PM   I agreed with the findings, treatment and disposition plan of this patient.  Kathryne Sharper, MD

## 2015-04-25 NOTE — Clinical Social Work Note (Signed)
Pt met with CSW, Steven [name] AC, and Steven Agustin NP to discuss aftercare and discharge plan. Pt originally stated that he was SI and had no family assistance. He stated that he could not leave [place] due to "probation" and refused to allow CSW to contact his PO in order to advocate for him to live with a supportive relative out of state. Pt presented with likelihood of having to be IVCed and sent back to [facility] [place] ED to await placement. Pt made call to his father who agreed to transfer him money via [employer] "to stay at a boarding house or extended stay hotel for awhile." Pt was given Boarding house and Extended stay hotel lists, IRC information, [facility] pamphlet, and [facility] pamphlet/Mobile Crisis information. Pt stated that his car is "in the parking lot." He made some calls to boarding homes and plans "to drive to a few at d/c to see if there are openings. Pt plans to go to extended stay hotel "as a last resort because they are more expensive." Pt is no longer endorsing SI, stating "my dad is helping me out now and I feel better about my situation." Pt has appts already scheduled at [facility] (1st appt on Tuesday). CSW encouraged pt to walk in between 8am-12pm on Monday if he feels that he needs to be seen sooner. Pt verbalized understanding of above. Pt mood is pleasant and calm at this time. He is agreeable to discharging today.   Smart, MSW, LCSW Clinical Social Worker 04/25/2015 11:14 AM      

## 2015-05-06 ENCOUNTER — Emergency Department (HOSPITAL_COMMUNITY)
Admission: EM | Admit: 2015-05-06 | Discharge: 2015-05-06 | Disposition: A | Discharge status: Home / Self Care | Payer: Self-pay | Attending: Emergency Medicine | Admitting: Emergency Medicine

## 2015-05-06 ENCOUNTER — Encounter (HOSPITAL_COMMUNITY): Payer: Self-pay | Admitting: *Deleted

## 2015-05-06 ENCOUNTER — Emergency Department (HOSPITAL_COMMUNITY): Payer: Self-pay

## 2015-05-06 DIAGNOSIS — I10 Essential (primary) hypertension: Secondary | ICD-10-CM | POA: Insufficient documentation

## 2015-05-06 DIAGNOSIS — R109 Unspecified abdominal pain: Secondary | ICD-10-CM | POA: Insufficient documentation

## 2015-05-06 DIAGNOSIS — J449 Chronic obstructive pulmonary disease, unspecified: Secondary | ICD-10-CM | POA: Insufficient documentation

## 2015-05-06 DIAGNOSIS — Z79899 Other long term (current) drug therapy: Secondary | ICD-10-CM | POA: Insufficient documentation

## 2015-05-06 DIAGNOSIS — R110 Nausea: Secondary | ICD-10-CM | POA: Insufficient documentation

## 2015-05-06 DIAGNOSIS — I2510 Atherosclerotic heart disease of native coronary artery without angina pectoris: Secondary | ICD-10-CM | POA: Insufficient documentation

## 2015-05-06 DIAGNOSIS — F17210 Nicotine dependence, cigarettes, uncomplicated: Secondary | ICD-10-CM | POA: Insufficient documentation

## 2015-05-06 DIAGNOSIS — Z9889 Other specified postprocedural states: Secondary | ICD-10-CM | POA: Insufficient documentation

## 2015-05-06 DIAGNOSIS — Z87442 Personal history of urinary calculi: Secondary | ICD-10-CM | POA: Insufficient documentation

## 2015-05-06 DIAGNOSIS — F319 Bipolar disorder, unspecified: Secondary | ICD-10-CM | POA: Insufficient documentation

## 2015-05-06 DIAGNOSIS — Z951 Presence of aortocoronary bypass graft: Secondary | ICD-10-CM | POA: Insufficient documentation

## 2015-05-06 DIAGNOSIS — E7800 Pure hypercholesterolemia, unspecified: Secondary | ICD-10-CM | POA: Insufficient documentation

## 2015-05-06 LAB — BASIC METABOLIC PANEL
ANION GAP: 11 (ref 5–15)
BUN: 23 mg/dL — AB (ref 6–20)
CHLORIDE: 102 mmol/L (ref 101–111)
CO2: 25 mmol/L (ref 22–32)
Calcium: 9.4 mg/dL (ref 8.9–10.3)
Creatinine, Ser: 1.03 mg/dL (ref 0.61–1.24)
GFR calc Af Amer: >60 mL/min (ref >60)
GLUCOSE: 124 mg/dL — AB (ref 65–99)
POTASSIUM: 3.9 mmol/L (ref 3.5–5.1)
Sodium: 138 mmol/L (ref 135–145)

## 2015-05-06 LAB — CBC WITH DIFFERENTIAL/PLATELET
BASOS ABS: 0 10*3/uL (ref 0.0–0.1)
Basophils Relative: 0 %
EOS PCT: 3 %
Eosinophils Absolute: 0.2 10*3/uL (ref 0.0–0.7)
HEMATOCRIT: 44.8 % (ref 39.0–52.0)
Hemoglobin: 15.4 g/dL (ref 13.0–17.0)
LYMPHS ABS: 1.3 10*3/uL (ref 0.7–4.0)
LYMPHS PCT: 19 %
MCH: 33.1 pg (ref 26.0–34.0)
MCHC: 34.4 g/dL (ref 30.0–36.0)
MCV: 96.3 fL (ref 78.0–100.0)
MONO ABS: 0.8 10*3/uL (ref 0.1–1.0)
MONOS PCT: 11 %
NEUTROS ABS: 4.7 10*3/uL (ref 1.7–7.7)
Neutrophils Relative %: 67 %
Platelets: 252 10*3/uL (ref 150–400)
RBC: 4.65 MIL/uL (ref 4.22–5.81)
RDW: 12.9 % (ref 11.5–15.5)
WBC: 7 10*3/uL (ref 4.0–10.5)

## 2015-05-06 LAB — URINALYSIS, ROUTINE W REFLEX MICROSCOPIC
Bilirubin Urine: NEGATIVE
GLUCOSE, UA: NEGATIVE mg/dL
HGB URINE DIPSTICK: NEGATIVE
KETONES UR: NEGATIVE mg/dL
LEUKOCYTES UA: NEGATIVE
Nitrite: NEGATIVE
PH: 6 (ref 5.0–8.0)
Protein, ur: NEGATIVE mg/dL
Specific Gravity, Urine: 1.024 (ref 1.005–1.030)

## 2015-05-06 MED ORDER — FENTANYL CITRATE (PF) 100 MCG/2ML IJ SOLN
50.0000 ug | INTRAMUSCULAR | Status: DC | PRN
  Administered 2015-05-06: 50 ug via INTRAVENOUS
  Filled 2015-05-06: qty 2

## 2015-05-06 MED ORDER — ONDANSETRON 8 MG PO TBDP: ORAL_TABLET | ORAL | Status: DC

## 2015-05-06 MED ORDER — METHOCARBAMOL 500 MG PO TABS: 500.0000 mg | ORAL_TABLET | Freq: Two times a day (BID) | ORAL | Status: DC

## 2015-05-06 MED ORDER — HYDROCODONE-ACETAMINOPHEN 5-325 MG PO TABS: 1.0000 | ORAL_TABLET | Freq: Four times a day (QID) | ORAL | Status: DC | PRN

## 2015-05-06 MED ORDER — ONDANSETRON HCL 4 MG/2ML IJ SOLN
4.0000 mg | Freq: Once | INTRAMUSCULAR | Status: AC
  Administered 2015-05-06: 4 mg via INTRAVENOUS
  Filled 2015-05-06: qty 2

## 2015-05-06 MED ORDER — KETOROLAC TROMETHAMINE 30 MG/ML IJ SOLN
30.0000 mg | Freq: Once | INTRAMUSCULAR | Status: AC
  Administered 2015-05-06: 30 mg via INTRAVENOUS
  Filled 2015-05-06: qty 1

## 2015-05-06 NOTE — ED Provider Notes (Signed)
CSN: [id number]     Arrival date & time 05/06/15  7829 History   First MD Initiated Contact with Patient 05/06/15 0848     Chief Complaint  Patient presents with  . Flank Pain  . Groin Pain     (Consider location/radiation/quality/duration/timing/severity/associated sxs/prior Treatment) The history is provided by the patient and medical records. No language interpreter was used.     Steven [name] is a 44 y.o. male  with a hx of CAD status post CABG and stenting, bipolar disorder, hypertension, hyperlipidemia, and suicidal ideation presents to the Emergency Department complaining of gradual, persistent, progressively worsening left flank pain and left groin pain onset 3 days ago.  Patient reports recurrent history of nephrolithiasis and renal colic multiple surgical interventions.  He reports that he is usually able to deal with his pain at home however this time the pain is increased and become persistent. He reports dark urine yesterday but no gross hematuria. Associated symptoms include nausea with one episode of NBNB emesis this morning. Nothing makes the symptoms better or worse. He denies fever, chills, headache, neck pain chest pain or shortness of breath, abdominal pain.   Past Medical History  Diagnosis Date  . Coronary artery disease   . Hypertension   . Hypercholesteremia   . Kidney stone   . Tobacco use disorder   . Hx of CABG   . COPD (chronic obstructive pulmonary disease) (HCC)   . Bipolar disorder (manic depression) (HCC)   . Asthma    Past Surgical History  Procedure Laterality Date  . Coronary artery bypass graft    . Kidney stone surgery    . Appendectomy    . Lower extremity angiogram N/A 08/15/2012    Procedure: LOWER EXTREMITY ANGIOGRAM with possible PTA /stent;  Surgeon: Corky Crafts, MD;  [facility] C[facility] LAB;  Service: Cardiovascular;  Laterality: N/A;  . Abdominal angiogram  08/15/2012    Procedure: ABDOMINAL ANGIOGRAM;  Surgeon: Corky Crafts,  MD;  Location: [facility] CATH LAB;  Service: Cardiovascular;;  . Cardioversion N/A 08/28/2012    Procedure: Pseudo Compression;  Surgeon: Chuck Hint, MD;  Location[facility] CATH LAB;  Service: Cardiovascular;  Laterality: N/A;  . Cystoscopy with retrograde pyelogram, ureteroscopy and stent placement Right 12/31/2014    Procedure: CYSTOSCOPY  RIGHT URETEROSCOPY WITH STONE RETREIVAL RIGHT RETROGRADE PYELOGRAM;  Surgeon: Malen Gauze, MD;  Location: [facility] ORS;  Service: Urology;  Laterality: Right;   Family History  Problem Relation Age of Onset  . Heart disease Father   . Hyperlipidemia Father   . Heart disease Maternal Grandmother    Social History  Substance Use Topics  . Smoking status: Current Every Day Smoker -- 1.00 packs/day for 31 years    Types: Cigarettes  . Smokeless tobacco: Former Neurosurgeon    Quit date: 04/27/2013  . Alcohol Use: Yes     Comment: last drink 03/07/2015     Review of Systems  Constitutional: Negative for fever, diaphoresis, appetite change, fatigue and unexpected weight change.  HENT: Negative for mouth sores.   Eyes: Negative for visual disturbance.  Respiratory: Negative for cough, chest tightness, shortness of breath and wheezing.   Cardiovascular: Negative for chest pain.  Gastrointestinal: Negative for nausea, vomiting, abdominal pain, diarrhea and constipation.  Endocrine: Negative for polydipsia, polyphagia and polyuria.  Genitourinary: Positive for flank pain. Negative for dysuria, urgency, frequency, hematuria, penile swelling and scrotal swelling.  Musculoskeletal: Negative for back pain and neck stiffness.  Skin: Negative for  rash.  Allergic/Immunologic: Negative for immunocompromised state.  Neurological: Negative for syncope, light-headedness and headaches.  Hematological: Does not bruise/bleed easily.  Psychiatric/Behavioral: Negative for sleep disturbance. The patient is not nervous/anxious.       Allergies  Review of patient's allergies  indicates no known allergies.  Home Medications   Prior to Admission medications   Medication Sig Start Date End Date Taking? Authorizing Provider  atorvastatin (LIPITOR) 20 MG tablet Take 1 tablet (20 mg total) by mouth daily at 6 PM. 04/25/15  Yes Adonis Brook, NP  FLUoxetine (PROZAC) 20 MG capsule Take 1 capsule (20 mg total) by mouth daily. 04/25/15  Yes Adonis Brook, NP  hydrOXYzine (ATARAX/VISTARIL) 50 MG tablet Take 1 tablet (50 mg total) by mouth every 6 (six) hours as needed for anxiety. 04/25/15  Yes Adonis Brook, NP  lurasidone (LATUDA) 40 MG TABS tablet Take 1 tablet (40 mg total) by mouth daily with breakfast. 04/25/15  Yes Adonis Brook, NP  metFORMIN (GLUCOPHAGE) 500 MG tablet Take 1 tablet (500 mg total) by mouth daily with breakfast. 04/25/15  Yes Adonis Brook, NP  traZODone (DESYREL) 100 MG tablet Take 1 tablet (100 mg total) by mouth at bedtime as needed for sleep. 04/25/15  Yes Adonis Brook, NP  HYDROcodone-acetaminophen (NORCO/VICODIN) 5-325 MG tablet Take 1-2 tablets by mouth every 6 (six) hours as needed for moderate pain or severe pain. 05/06/15   Garek Schuneman, PA-C  methocarbamol (ROBAXIN) 500 MG tablet Take 1 tablet (500 mg total) by mouth 2 (two) times daily. 05/06/15   Keondria Siever, PA-C  ondansetron (ZOFRAN ODT) 8 MG disintegrating tablet  ODT q4 hours prn nausea 05/06/15   Landra Howze, PA-C   BP 178/91 mmHg  Pulse 96  Temp(Src) 97.5 F (36.4 C) (Oral)  Resp 18  SpO2 96% Physical Exam  Constitutional: He appears well-developed and well-nourished. No distress.  HENT:  Head: Normocephalic and atraumatic.  Mouth/Throat: Oropharynx is clear and moist. No oropharyngeal exudate.  Cardiovascular: Normal rate, regular rhythm, normal heart sounds and intact distal pulses.   Pulmonary/Chest: Effort normal and breath sounds normal. No respiratory distress. He has no wheezes.  Abdominal: Soft. Bowel sounds are normal. He exhibits no  distension and no mass. There is no tenderness. There is CVA tenderness (left). There is no rebound and no guarding. Hernia confirmed negative in the right inguinal area and confirmed negative in the left inguinal area.  Genitourinary: Testes normal and penis normal. Right testis shows no mass, no swelling and no tenderness. Left testis shows no mass, no swelling and no tenderness. Circumcised.  Musculoskeletal: Normal range of motion. He exhibits no edema.  Lymphadenopathy:       Right: No inguinal adenopathy present.       Left: No inguinal adenopathy present.  Neurological: He is alert.  Skin: Skin is warm and dry. No rash noted. He is not diaphoretic.  Psychiatric: He has a normal mood and affect.  Nursing note and vitals reviewed.   ED Course  Procedures (including critical care time) Labs Review Labs Reviewed  BASIC METABOLIC PANEL - Abnormal; Notable for the following:    Glucose, Bld 124 (*)    BUN 23 (*)    All other components within normal limits  CBC WITH DIFFERENTIAL/PLATELET  URINALYSIS, ROUTINE W REFLEX MICROSCOPIC (NOT AT [facility])    Imaging Review Ct Renal Stone Study  05/06/2015  CLINICAL DATA:  Evaluate for kidney stone EXAM: CT ABDOMEN AND PELVIS WITHOUT CONTRAST TECHNIQUE: Multidetector CT imaging of the  abdomen and pelvis was performed following the standard protocol without IV contrast. COMPARISON:  12/29/2014 FINDINGS: Lower chest: No pleural or pericardial effusion. Lung bases are clear. Hepatobiliary: No suspicious liver abnormalities identified. The gallbladder appears normal. There is no biliary dilatation. Pancreas: Negative Spleen: The spleen appears normal. Adrenals/Urinary Tract: Normal adrenal glands. There are several cyst within the right kidney. These are incompletely characterized without IV contrast material. No right-sided nephrolithiasis or hydronephrosis. No hydroureter. Parenchymal calcification within the upper pole cortex of the left kidney measures  4 mm. There is left sided renal vascular calcifications noted. No kidney stone noted. No hydronephrosis. Urinary bladder appears normal. Stomach/Bowel: The stomach is normal. The small bowel loops have a normal course and caliber. There is no pathologic dilatation of the large bowel loops. Vascular/Lymphatic: Calcified atherosclerotic disease involves the abdominal aorta. No aneurysm. No enlarged retroperitoneal or mesenteric adenopathy. No enlarged pelvic or inguinal lymph nodes. Reproductive: The prostate gland and seminal vesicles are within normal limits. Other: There is no ascites or focal fluid collections within the abdomen or pelvis. Musculoskeletal: Unremarkable. IMPRESSION: 1. No evidence for nephrolithiasis or obstructive uropathy. 2. Calcifications within the left kidney are likely vascular in origin. 3. Right renal cysts are incompletely characterized without IV contrast. 4. Aortic atherosclerosis. Electronically Signed   By: Signa Kellaylor  Stroud M.D.   On: 05/06/2015 10:13   I have personally reviewed and evaluated these images and lab results as part of my medical decision-making.   EKG Interpretation None      MDM   Final diagnoses:  Left flank pain   Steven [name] presents with history of kidney stones and left flank pain for the last several days. He reports this pain is the same as previous kidney stones. His a history of surgical removal of his nephrolithiasis.  Labs are reassuring, urine is without hemoglobin, no elevation in serum creatinine. CT renal stone study shows no evidence of nephrolithiasis.  Patient's genital exam reveals no tenderness to palpation of his testicles, no lesions, no swelling or abnormalities. Doubt testicular torsion.  Patient denies new activities, heavy lifting however lumbar/lower thoracic strain is still a possibility.  We'll discharge with pain control, nausea control and muscle relaxer. Encouraged close follow-up with primary care and urology. Patient is  return to the emergency department if symptoms persist, he has intractable vomiting or develops fever.  Patient is well-appearing and tolerating by mouth here in the emergency department.  BP 178/91 mmHg  Pulse 96  Temp(Src) 97.5 F (36.4 C) (Oral)  Resp 18  SpO2 96%   Dierdre ForthHannah Meyli Boice, PA-C 05/06/15 1123  Gerhard Munchobert Lockwood, MD 05/06/15 1551

## 2015-05-06 NOTE — ED Notes (Signed)
Patient transported to CT 

## 2015-05-06 NOTE — Discharge Instructions (Signed)
1. Medications: Vicodin, Robaxin, Zofran, usual home medications 2. Treatment: rest, drink plenty of fluids,  3. Follow Up: Please followup with your primary doctor and/or urology in 2-3 days for discussion of your diagnoses and further evaluation after today's visit; if you do not have a primary care doctor use the resource guide provided to find one; Please return to the ER for worsening pain, high fevers or persistent vomiting.

## 2015-05-06 NOTE — ED Notes (Signed)
Patient has a history of kidney stones and states that for the past 2 days he has had left flank and groin pain consistent with kidney stones. He is usually able to pass them at home without seeking medical attention, but the pain has gotten progressively worse with no relief this time.

## 2015-05-10 ENCOUNTER — Emergency Department (HOSPITAL_COMMUNITY)
Admission: EM | Admit: 2015-05-10 | Discharge: 2015-05-10 | Disposition: A | Discharge status: Home / Self Care | Payer: Self-pay | Attending: Emergency Medicine | Admitting: Emergency Medicine

## 2015-05-10 ENCOUNTER — Encounter (HOSPITAL_COMMUNITY): Payer: Self-pay | Admitting: *Deleted

## 2015-05-10 ENCOUNTER — Emergency Department (HOSPITAL_COMMUNITY): Payer: Self-pay

## 2015-05-10 DIAGNOSIS — M545 Low back pain, unspecified: Secondary | ICD-10-CM

## 2015-05-10 DIAGNOSIS — Z7984 Long term (current) use of oral hypoglycemic drugs: Secondary | ICD-10-CM | POA: Insufficient documentation

## 2015-05-10 DIAGNOSIS — I2510 Atherosclerotic heart disease of native coronary artery without angina pectoris: Secondary | ICD-10-CM | POA: Insufficient documentation

## 2015-05-10 DIAGNOSIS — Y998 Other external cause status: Secondary | ICD-10-CM | POA: Insufficient documentation

## 2015-05-10 DIAGNOSIS — I10 Essential (primary) hypertension: Secondary | ICD-10-CM | POA: Insufficient documentation

## 2015-05-10 DIAGNOSIS — J449 Chronic obstructive pulmonary disease, unspecified: Secondary | ICD-10-CM | POA: Insufficient documentation

## 2015-05-10 DIAGNOSIS — E7800 Pure hypercholesterolemia, unspecified: Secondary | ICD-10-CM | POA: Insufficient documentation

## 2015-05-10 DIAGNOSIS — S3992XA Unspecified injury of lower back, initial encounter: Secondary | ICD-10-CM | POA: Insufficient documentation

## 2015-05-10 DIAGNOSIS — Y9389 Activity, other specified: Secondary | ICD-10-CM | POA: Insufficient documentation

## 2015-05-10 DIAGNOSIS — F319 Bipolar disorder, unspecified: Secondary | ICD-10-CM | POA: Insufficient documentation

## 2015-05-10 DIAGNOSIS — Z79899 Other long term (current) drug therapy: Secondary | ICD-10-CM | POA: Insufficient documentation

## 2015-05-10 DIAGNOSIS — Y92410 Unspecified street and highway as the place of occurrence of the external cause: Secondary | ICD-10-CM | POA: Insufficient documentation

## 2015-05-10 DIAGNOSIS — Z87442 Personal history of urinary calculi: Secondary | ICD-10-CM | POA: Insufficient documentation

## 2015-05-10 DIAGNOSIS — S199XXA Unspecified injury of neck, initial encounter: Secondary | ICD-10-CM | POA: Insufficient documentation

## 2015-05-10 DIAGNOSIS — Z951 Presence of aortocoronary bypass graft: Secondary | ICD-10-CM | POA: Insufficient documentation

## 2015-05-10 DIAGNOSIS — F17210 Nicotine dependence, cigarettes, uncomplicated: Secondary | ICD-10-CM | POA: Insufficient documentation

## 2015-05-10 MED ORDER — LIDOCAINE 5 % EX PTCH: 1.0000 | MEDICATED_PATCH | CUTANEOUS | Status: DC

## 2015-05-10 MED ORDER — IBUPROFEN 800 MG PO TABS: 800.0000 mg | ORAL_TABLET | Freq: Three times a day (TID) | ORAL | Status: DC

## 2015-05-10 MED ORDER — LIDOCAINE 5 % EX PTCH
1.0000 | MEDICATED_PATCH | CUTANEOUS | Status: DC
  Administered 2015-05-10: 1 via TRANSDERMAL
  Filled 2015-05-10: qty 1

## 2015-05-10 MED ORDER — METHOCARBAMOL 500 MG PO TABS
1000.0000 mg | ORAL_TABLET | Freq: Once | ORAL | Status: AC
  Administered 2015-05-10: 1000 mg via ORAL
  Filled 2015-05-10: qty 2

## 2015-05-10 MED ORDER — OXYCODONE-ACETAMINOPHEN 5-325 MG PO TABS: 1.0000 | ORAL_TABLET | ORAL | Status: DC | PRN

## 2015-05-10 MED ORDER — KETOROLAC TROMETHAMINE 60 MG/2ML IM SOLN
60.0000 mg | Freq: Once | INTRAMUSCULAR | Status: AC
  Administered 2015-05-10: 60 mg via INTRAMUSCULAR
  Filled 2015-05-10: qty 2

## 2015-05-10 MED ORDER — METHOCARBAMOL 500 MG PO TABS: 500.0000 mg | ORAL_TABLET | Freq: Two times a day (BID) | ORAL | Status: DC

## 2015-05-10 MED ORDER — OXYCODONE-ACETAMINOPHEN 5-325 MG PO TABS
2.0000 | ORAL_TABLET | Freq: Once | ORAL | Status: AC
  Administered 2015-05-10: 2 via ORAL
  Filled 2015-05-10: qty 2
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
## 2015-06-17 ENCOUNTER — Emergency Department (HOSPITAL_COMMUNITY)
Admission: EM | Admit: 2015-06-17 | Discharge: 2015-06-18 | Disposition: A | Discharge status: Other Acute Inpatient Hospital | Payer: Federal, State, Local not specified - Other

## 2015-06-17 ENCOUNTER — Encounter (HOSPITAL_COMMUNITY): Payer: Self-pay | Admitting: Emergency Medicine

## 2015-06-17 DIAGNOSIS — F1910 Other psychoactive substance abuse, uncomplicated: Secondary | ICD-10-CM

## 2015-06-17 DIAGNOSIS — R45851 Suicidal ideations: Secondary | ICD-10-CM

## 2015-06-17 DIAGNOSIS — K029 Dental caries, unspecified: Secondary | ICD-10-CM | POA: Insufficient documentation

## 2015-06-17 DIAGNOSIS — E7800 Pure hypercholesterolemia, unspecified: Secondary | ICD-10-CM | POA: Insufficient documentation

## 2015-06-17 DIAGNOSIS — F1410 Cocaine abuse, uncomplicated: Secondary | ICD-10-CM | POA: Insufficient documentation

## 2015-06-17 DIAGNOSIS — K0889 Other specified disorders of teeth and supporting structures: Secondary | ICD-10-CM | POA: Insufficient documentation

## 2015-06-17 DIAGNOSIS — F17210 Nicotine dependence, cigarettes, uncomplicated: Secondary | ICD-10-CM | POA: Insufficient documentation

## 2015-06-17 DIAGNOSIS — I10 Essential (primary) hypertension: Secondary | ICD-10-CM | POA: Insufficient documentation

## 2015-06-17 DIAGNOSIS — F1210 Cannabis abuse, uncomplicated: Secondary | ICD-10-CM | POA: Insufficient documentation

## 2015-06-17 DIAGNOSIS — Z7982 Long term (current) use of aspirin: Secondary | ICD-10-CM | POA: Insufficient documentation

## 2015-06-17 DIAGNOSIS — I2510 Atherosclerotic heart disease of native coronary artery without angina pectoris: Secondary | ICD-10-CM | POA: Insufficient documentation

## 2015-06-17 DIAGNOSIS — F1420 Cocaine dependence, uncomplicated: Secondary | ICD-10-CM | POA: Diagnosis present

## 2015-06-17 DIAGNOSIS — Z87442 Personal history of urinary calculi: Secondary | ICD-10-CM | POA: Insufficient documentation

## 2015-06-17 DIAGNOSIS — Z791 Long term (current) use of non-steroidal anti-inflammatories (NSAID): Secondary | ICD-10-CM | POA: Insufficient documentation

## 2015-06-17 DIAGNOSIS — F1010 Alcohol abuse, uncomplicated: Secondary | ICD-10-CM | POA: Diagnosis present

## 2015-06-17 DIAGNOSIS — Z951 Presence of aortocoronary bypass graft: Secondary | ICD-10-CM | POA: Insufficient documentation

## 2015-06-17 DIAGNOSIS — J449 Chronic obstructive pulmonary disease, unspecified: Secondary | ICD-10-CM | POA: Insufficient documentation

## 2015-06-17 DIAGNOSIS — Z7984 Long term (current) use of oral hypoglycemic drugs: Secondary | ICD-10-CM | POA: Insufficient documentation

## 2015-06-17 DIAGNOSIS — F3130 Bipolar disorder, current episode depressed, mild or moderate severity, unspecified: Secondary | ICD-10-CM | POA: Insufficient documentation

## 2015-06-17 DIAGNOSIS — F319 Bipolar disorder, unspecified: Secondary | ICD-10-CM | POA: Diagnosis present

## 2015-06-17 DIAGNOSIS — K0381 Cracked tooth: Secondary | ICD-10-CM | POA: Insufficient documentation

## 2015-06-17 DIAGNOSIS — Z79899 Other long term (current) drug therapy: Secondary | ICD-10-CM | POA: Insufficient documentation

## 2015-06-17 LAB — CBC
HEMATOCRIT: 46.1 % (ref 39.0–52.0)
HEMOGLOBIN: 15.9 g/dL (ref 13.0–17.0)
MCH: 33.5 pg (ref 26.0–34.0)
MCHC: 34.5 g/dL (ref 30.0–36.0)
MCV: 97.1 fL (ref 78.0–100.0)
Platelets: 314 10*3/uL (ref 150–400)
RBC: 4.75 MIL/uL (ref 4.22–5.81)
RDW: 13.7 % (ref 11.5–15.5)
WBC: 8.1 10*3/uL (ref 4.0–10.5)

## 2015-06-17 LAB — COMPREHENSIVE METABOLIC PANEL
ALBUMIN: 4.7 g/dL (ref 3.5–5.0)
ALK PHOS: 91 U/L (ref 38–126)
ALT: 26 U/L (ref 17–63)
ANION GAP: 16 — AB (ref 5–15)
AST: 34 U/L (ref 15–41)
BILIRUBIN TOTAL: 1.3 mg/dL — AB (ref 0.3–1.2)
BUN: 14 mg/dL (ref 6–20)
CALCIUM: 9.4 mg/dL (ref 8.9–10.3)
CO2: 21 mmol/L — ABNORMAL LOW (ref 22–32)
Chloride: 98 mmol/L — ABNORMAL LOW (ref 101–111)
Creatinine, Ser: 1.11 mg/dL (ref 0.61–1.24)
GFR calc Af Amer: >60 mL/min (ref >60)
GLUCOSE: 115 mg/dL — AB (ref 65–99)
Potassium: 3.9 mmol/L (ref 3.5–5.1)
Sodium: 135 mmol/L (ref 135–145)
Total Protein: 8.1 g/dL (ref 6.5–8.1)

## 2015-06-17 LAB — RAPID URINE DRUG SCREEN, HOSP PERFORMED
AMPHETAMINES: NOT DETECTED
BARBITURATES: NOT DETECTED
BENZODIAZEPINES: NOT DETECTED
COCAINE: POSITIVE — AB
Opiates: NOT DETECTED
TETRAHYDROCANNABINOL: POSITIVE — AB

## 2015-06-17 LAB — ETHANOL: Alcohol, Ethyl (B): <5 mg/dL (ref <5)

## 2015-06-17 LAB — SALICYLATE LEVEL: Salicylate Lvl: <4 mg/dL (ref 2.8–30.0)

## 2015-06-17 LAB — ACETAMINOPHEN LEVEL

## 2015-06-17 MED ORDER — TRAZODONE HCL 100 MG PO TABS
100.0000 mg | ORAL_TABLET | Freq: Every evening | ORAL | Status: DC | PRN
  Administered 2015-06-17: 100 mg via ORAL
  Filled 2015-06-17: qty 1

## 2015-06-17 MED ORDER — NICOTINE 21 MG/24HR TD PT24
21.0000 mg | MEDICATED_PATCH | Freq: Every day | TRANSDERMAL | Status: DC
  Administered 2015-06-17 – 2015-06-18 (×2): 21 mg via TRANSDERMAL
  Filled 2015-06-17 (×2): qty 1

## 2015-06-17 MED ORDER — THIAMINE HCL 100 MG/ML IJ SOLN: 100.0000 mg | Freq: Every day | INTRAMUSCULAR | Status: DC

## 2015-06-17 MED ORDER — METHOCARBAMOL 500 MG PO TABS
500.0000 mg | ORAL_TABLET | Freq: Two times a day (BID) | ORAL | Status: DC
  Administered 2015-06-17 – 2015-06-18 (×2): 500 mg via ORAL
  Filled 2015-06-17 (×2): qty 1

## 2015-06-17 MED ORDER — VITAMIN B-1 100 MG PO TABS
100.0000 mg | ORAL_TABLET | Freq: Every day | ORAL | Status: DC
  Administered 2015-06-17 – 2015-06-18 (×2): 100 mg via ORAL
  Filled 2015-06-17 (×2): qty 1

## 2015-06-17 MED ORDER — ATORVASTATIN CALCIUM 20 MG PO TABS
20.0000 mg | ORAL_TABLET | Freq: Every day | ORAL | Status: DC
  Filled 2015-06-17: qty 1

## 2015-06-17 MED ORDER — IBUPROFEN 200 MG PO TABS
600.0000 mg | ORAL_TABLET | Freq: Three times a day (TID) | ORAL | Status: DC | PRN
  Administered 2015-06-17: 600 mg via ORAL
  Filled 2015-06-17: qty 3

## 2015-06-17 MED ORDER — LORAZEPAM 1 MG PO TABS: 0.0000 mg | ORAL_TABLET | Freq: Two times a day (BID) | ORAL | Status: DC

## 2015-06-17 MED ORDER — HYDROXYZINE HCL 25 MG PO TABS
50.0000 mg | ORAL_TABLET | Freq: Four times a day (QID) | ORAL | Status: DC | PRN
Start: 2015-06-17 — End: 2015-06-18

## 2015-06-17 MED ORDER — LORAZEPAM 1 MG PO TABS
1.0000 mg | ORAL_TABLET | Freq: Once | ORAL | Status: AC
  Administered 2015-06-17: 1 mg via ORAL
  Filled 2015-06-17: qty 1

## 2015-06-17 MED ORDER — ACETAMINOPHEN 325 MG PO TABS
650.0000 mg | ORAL_TABLET | ORAL | Status: DC | PRN
  Administered 2015-06-18: 650 mg via ORAL
  Filled 2015-06-17: qty 2

## 2015-06-17 MED ORDER — METFORMIN HCL 500 MG PO TABS
500.0000 mg | ORAL_TABLET | Freq: Every day | ORAL | Status: DC
  Administered 2015-06-18: 500 mg via ORAL
  Filled 2015-06-17 (×2): qty 1

## 2015-06-17 MED ORDER — FLUOXETINE HCL 20 MG PO CAPS
20.0000 mg | ORAL_CAPSULE | Freq: Every day | ORAL | Status: DC
  Administered 2015-06-17 – 2015-06-18 (×2): 20 mg via ORAL
  Filled 2015-06-17 (×2): qty 1

## 2015-06-17 MED ORDER — LURASIDONE HCL 40 MG PO TABS
40.0000 mg | ORAL_TABLET | Freq: Every day | ORAL | Status: DC
  Administered 2015-06-18: 40 mg via ORAL
  Filled 2015-06-17 (×2): qty 1

## 2015-06-17 MED ORDER — PENICILLIN V POTASSIUM 500 MG PO TABS
500.0000 mg | ORAL_TABLET | Freq: Four times a day (QID) | ORAL | Status: DC
  Administered 2015-06-17 – 2015-06-18 (×4): 500 mg via ORAL
  Filled 2015-06-17 (×4): qty 1

## 2015-06-17 MED ORDER — LORAZEPAM 2 MG/ML IJ SOLN: 1.0000 mg | Freq: Four times a day (QID) | INTRAMUSCULAR | Status: DC | PRN

## 2015-06-17 MED ORDER — LORAZEPAM 1 MG PO TABS
0.0000 mg | ORAL_TABLET | Freq: Four times a day (QID) | ORAL | Status: DC
  Administered 2015-06-17: 1 mg via ORAL
  Filled 2015-06-17: qty 1

## 2015-06-17 MED ORDER — ADULT MULTIVITAMIN W/MINERALS CH
1.0000 | ORAL_TABLET | Freq: Every day | ORAL | Status: DC
  Administered 2015-06-17 – 2015-06-18 (×2): 1 via ORAL
  Filled 2015-06-17 (×2): qty 1

## 2015-06-17 MED ORDER — FOLIC ACID 1 MG PO TABS
1.0000 mg | ORAL_TABLET | Freq: Every day | ORAL | Status: DC
  Administered 2015-06-17 – 2015-06-18 (×2): 1 mg via ORAL
  Filled 2015-06-17 (×2): qty 1

## 2015-06-17 MED ORDER — LORAZEPAM 1 MG PO TABS
0.0000 mg | ORAL_TABLET | Freq: Four times a day (QID) | ORAL | Status: DC
  Administered 2015-06-18 (×2): 1 mg via ORAL
  Filled 2015-06-17 (×2): qty 1

## 2015-06-17 MED ORDER — LORAZEPAM 1 MG PO TABS: 1.0000 mg | ORAL_TABLET | Freq: Four times a day (QID) | ORAL | Status: DC | PRN

## 2015-06-17 MED ORDER — NICOTINE 21 MG/24HR TD PT24: 21.0000 mg | MEDICATED_PATCH | Freq: Every day | TRANSDERMAL | Status: DC

## 2015-06-17 MED ORDER — IBUPROFEN 200 MG PO TABS
600.0000 mg | ORAL_TABLET | Freq: Once | ORAL | Status: AC
  Administered 2015-06-17: 600 mg via ORAL
  Filled 2015-06-17: qty 3

## 2015-06-17 MED ORDER — ONDANSETRON HCL 4 MG PO TABS: 4.0000 mg | ORAL_TABLET | Freq: Three times a day (TID) | ORAL | Status: DC | PRN

## 2015-06-17 NOTE — ED Notes (Signed)
Pt oriented to room and unit.  Pt states he is depressed and S/I.  Pt contracts for safety.  Ibuprofen given for tooth ache.  Pt denies H/I and AVH.  15 minute checks and video monitoring continue.
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
## 2015-06-17 NOTE — ED Notes (Signed)
Pt sleeping at present, no distress noted, remains SI, monitoring for safety, Q 15 min checks in effect.
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
## 2015-06-17 NOTE — B[facility] Counselor (Signed)
This Clinical research associate submitted supporting documentation to the following facilities for patient placement: [place] [facility] [place], [place] OBS Counselor
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
## 2015-06-17 NOTE — ED Notes (Signed)
Bed: [facility] Expected date:  Expected time:  Means of arrival:  Comments: TR 5

## 2015-06-18 ENCOUNTER — Inpatient Hospital Stay (HOSPITAL_COMMUNITY)
Admission: AD | Admit: 2015-06-18 | Discharge: 2015-06-28 | DRG: 885 | Disposition: A | Discharge status: Home / Self Care | Payer: Federal, State, Local not specified - Other | Source: Intra-hospital | Attending: Psychiatry | Admitting: Psychiatry

## 2015-06-18 ENCOUNTER — Encounter (HOSPITAL_COMMUNITY): Payer: Self-pay | Admitting: *Deleted

## 2015-06-18 DIAGNOSIS — F1020 Alcohol dependence, uncomplicated: Secondary | ICD-10-CM | POA: Diagnosis present

## 2015-06-18 DIAGNOSIS — E7800 Pure hypercholesterolemia, unspecified: Secondary | ICD-10-CM | POA: Diagnosis present

## 2015-06-18 DIAGNOSIS — F410 Panic disorder [episodic paroxysmal anxiety] without agoraphobia: Secondary | ICD-10-CM | POA: Diagnosis present

## 2015-06-18 DIAGNOSIS — F332 Major depressive disorder, recurrent severe without psychotic features: Principal | ICD-10-CM | POA: Diagnosis present

## 2015-06-18 DIAGNOSIS — F319 Bipolar disorder, unspecified: Secondary | ICD-10-CM | POA: Diagnosis present

## 2015-06-18 DIAGNOSIS — J45909 Unspecified asthma, uncomplicated: Secondary | ICD-10-CM | POA: Diagnosis present

## 2015-06-18 DIAGNOSIS — E119 Type 2 diabetes mellitus without complications: Secondary | ICD-10-CM | POA: Diagnosis present

## 2015-06-18 DIAGNOSIS — I10 Essential (primary) hypertension: Secondary | ICD-10-CM | POA: Diagnosis present

## 2015-06-18 DIAGNOSIS — F1010 Alcohol abuse, uncomplicated: Secondary | ICD-10-CM

## 2015-06-18 DIAGNOSIS — J449 Chronic obstructive pulmonary disease, unspecified: Secondary | ICD-10-CM | POA: Diagnosis present

## 2015-06-18 DIAGNOSIS — F17210 Nicotine dependence, cigarettes, uncomplicated: Secondary | ICD-10-CM | POA: Diagnosis present

## 2015-06-18 DIAGNOSIS — I2510 Atherosclerotic heart disease of native coronary artery without angina pectoris: Secondary | ICD-10-CM | POA: Diagnosis present

## 2015-06-18 DIAGNOSIS — G4700 Insomnia, unspecified: Secondary | ICD-10-CM | POA: Diagnosis present

## 2015-06-18 DIAGNOSIS — F1410 Cocaine abuse, uncomplicated: Secondary | ICD-10-CM | POA: Diagnosis not present

## 2015-06-18 DIAGNOSIS — F1420 Cocaine dependence, uncomplicated: Secondary | ICD-10-CM | POA: Diagnosis present

## 2015-06-18 DIAGNOSIS — R45851 Suicidal ideations: Secondary | ICD-10-CM | POA: Diagnosis present

## 2015-06-18 DIAGNOSIS — Z951 Presence of aortocoronary bypass graft: Secondary | ICD-10-CM | POA: Diagnosis not present

## 2015-06-18 DIAGNOSIS — Z8249 Family history of ischemic heart disease and other diseases of the circulatory system: Secondary | ICD-10-CM

## 2015-06-18 DIAGNOSIS — E785 Hyperlipidemia, unspecified: Secondary | ICD-10-CM | POA: Diagnosis present

## 2015-06-18 MED ORDER — ATORVASTATIN CALCIUM 20 MG PO TABS
20.0000 mg | ORAL_TABLET | Freq: Every day | ORAL | Status: DC
  Administered 2015-06-18 – 2015-06-28 (×11): 20 mg via ORAL
  Filled 2015-06-18 (×5): qty 1
  Filled 2015-06-18: qty 2
  Filled 2015-06-18 (×6): qty 1
  Filled 2015-06-18: qty 2
  Filled 2015-06-18: qty 1

## 2015-06-18 MED ORDER — MAGNESIUM HYDROXIDE 400 MG/5ML PO SUSP
30.0000 mL | Freq: Every day | ORAL | Status: DC | PRN
  Administered 2015-06-22 – 2015-06-25 (×2): 30 mL via ORAL
  Filled 2015-06-18 (×2): qty 30

## 2015-06-18 MED ORDER — LORAZEPAM 1 MG PO TABS
0.0000 mg | ORAL_TABLET | Freq: Four times a day (QID) | ORAL | Status: DC
  Administered 2015-06-18: 1 mg via ORAL
  Administered 2015-06-19: 2 mg via ORAL
  Administered 2015-06-19: 1 mg via ORAL
  Filled 2015-06-18: qty 1
  Filled 2015-06-18: qty 2
  Filled 2015-06-18: qty 1

## 2015-06-18 MED ORDER — FOLIC ACID 1 MG PO TABS
1.0000 mg | ORAL_TABLET | Freq: Every day | ORAL | Status: DC
  Administered 2015-06-19 – 2015-06-28 (×10): 1 mg via ORAL
  Filled 2015-06-18 (×12): qty 1

## 2015-06-18 MED ORDER — ADULT MULTIVITAMIN W/MINERALS CH
1.0000 | ORAL_TABLET | Freq: Every day | ORAL | Status: DC
  Filled 2015-06-18 (×2): qty 1

## 2015-06-18 MED ORDER — FLUOXETINE HCL 20 MG PO CAPS
20.0000 mg | ORAL_CAPSULE | Freq: Every day | ORAL | Status: DC
  Administered 2015-06-19 – 2015-06-22 (×4): 20 mg via ORAL
  Filled 2015-06-18 (×5): qty 1

## 2015-06-18 MED ORDER — HYDROXYZINE HCL 50 MG PO TABS
50.0000 mg | ORAL_TABLET | Freq: Four times a day (QID) | ORAL | Status: DC | PRN
  Administered 2015-06-18 – 2015-06-28 (×13): 50 mg via ORAL
  Filled 2015-06-18 (×14): qty 1

## 2015-06-18 MED ORDER — LORAZEPAM 2 MG/ML IJ SOLN: 1.0000 mg | Freq: Four times a day (QID) | INTRAMUSCULAR | Status: DC | PRN

## 2015-06-18 MED ORDER — VITAMIN B-1 100 MG PO TABS
100.0000 mg | ORAL_TABLET | Freq: Every day | ORAL | Status: DC
  Filled 2015-06-18 (×2): qty 1

## 2015-06-18 MED ORDER — NICOTINE 21 MG/24HR TD PT24
21.0000 mg | MEDICATED_PATCH | Freq: Every day | TRANSDERMAL | Status: DC
  Administered 2015-06-19 – 2015-06-28 (×10): 21 mg via TRANSDERMAL
  Filled 2015-06-18 (×12): qty 1

## 2015-06-18 MED ORDER — METHOCARBAMOL 500 MG PO TABS
500.0000 mg | ORAL_TABLET | Freq: Two times a day (BID) | ORAL | Status: DC
  Administered 2015-06-18 – 2015-06-28 (×21): 500 mg via ORAL
  Filled 2015-06-18 (×29): qty 1

## 2015-06-18 MED ORDER — ONDANSETRON HCL 4 MG PO TABS: 4.0000 mg | ORAL_TABLET | Freq: Three times a day (TID) | ORAL | Status: DC | PRN

## 2015-06-18 MED ORDER — TRAZODONE HCL 100 MG PO TABS
100.0000 mg | ORAL_TABLET | Freq: Every evening | ORAL | Status: DC | PRN
  Administered 2015-06-18 – 2015-06-27 (×10): 100 mg via ORAL
  Filled 2015-06-18 (×10): qty 1

## 2015-06-18 MED ORDER — LORAZEPAM 1 MG PO TABS
1.0000 mg | ORAL_TABLET | Freq: Four times a day (QID) | ORAL | Status: DC | PRN
  Administered 2015-06-18: 1 mg via ORAL
  Filled 2015-06-18: qty 1

## 2015-06-18 MED ORDER — PANTOPRAZOLE SODIUM 20 MG PO TBEC
20.0000 mg | DELAYED_RELEASE_TABLET | Freq: Every day | ORAL | Status: DC
  Filled 2015-06-18: qty 1

## 2015-06-18 MED ORDER — IBUPROFEN 600 MG PO TABS
600.0000 mg | ORAL_TABLET | Freq: Three times a day (TID) | ORAL | Status: DC | PRN
  Administered 2015-06-18 – 2015-06-27 (×7): 600 mg via ORAL
  Filled 2015-06-18 (×7): qty 1

## 2015-06-18 MED ORDER — ALUM & MAG HYDROXIDE-SIMETH 200-200-20 MG/5ML PO SUSP
30.0000 mL | ORAL | Status: DC | PRN
  Administered 2015-06-19 – 2015-06-28 (×12): 30 mL via ORAL
  Filled 2015-06-18 (×12): qty 30

## 2015-06-18 MED ORDER — METFORMIN HCL 500 MG PO TABS
500.0000 mg | ORAL_TABLET | Freq: Every day | ORAL | Status: DC
  Administered 2015-06-19 – 2015-06-28 (×10): 500 mg via ORAL
  Filled 2015-06-18 (×12): qty 1

## 2015-06-18 MED ORDER — ALUM & MAG HYDROXIDE-SIMETH 200-200-20 MG/5ML PO SUSP
15.0000 mL | Freq: Four times a day (QID) | ORAL | Status: DC | PRN
  Administered 2015-06-18: 15 mL via ORAL
  Filled 2015-06-18: qty 30

## 2015-06-18 MED ORDER — PENICILLIN V POTASSIUM 250 MG PO TABS
500.0000 mg | ORAL_TABLET | Freq: Four times a day (QID) | ORAL | Status: AC
  Administered 2015-06-18 – 2015-06-27 (×22): 500 mg via ORAL
  Filled 2015-06-18 (×40): qty 2

## 2015-06-18 MED ORDER — LORAZEPAM 1 MG PO TABS: 0.0000 mg | ORAL_TABLET | Freq: Two times a day (BID) | ORAL | Status: DC

## 2015-06-18 MED ORDER — LURASIDONE HCL 40 MG PO TABS
40.0000 mg | ORAL_TABLET | Freq: Every day | ORAL | Status: DC
  Administered 2015-06-19 – 2015-06-20 (×2): 40 mg via ORAL
  Filled 2015-06-18 (×4): qty 1

## 2015-06-18 MED ORDER — THIAMINE HCL 100 MG/ML IJ SOLN
100.0000 mg | Freq: Every day | INTRAMUSCULAR | Status: DC
  Filled 2015-06-18: qty 2

## 2015-06-18 MED ORDER — ACETAMINOPHEN 325 MG PO TABS
650.0000 mg | ORAL_TABLET | ORAL | Status: DC | PRN
  Administered 2015-06-18 – 2015-06-22 (×5): 650 mg via ORAL
  Filled 2015-06-18 (×5): qty 2

## 2015-06-18 NOTE — BH Assessment (Signed)
[facility] Assessment Progress Note  Per Thedore Mins, MD, this pt requires psychiatric hospitalization at this time.  Baird Kay, RN, [facility] has assigned pt to [facility] Rm 307-1.  Pt has signed Voluntary Admission and Consent for Treatment, as well as Consent to Release Information to no one, and signed forms have been faxed to [facility].  Pt's nurse, Karoline Caldwell, has been notified, and agrees to send original paperwork along with pt via Juel Burrow, and to call report to [phone].  Doylene Canning, MA Triage Specialist [phone]
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
## 2015-06-18 NOTE — Progress Notes (Signed)
D.  Pt pleasant on approach, minimal signs and symptoms of withdrawal.  Main complaint tooth ache.  Pt did go into group late, interacting appropriately with peers on the unit.  Denies SI/HI/hallucinations at this time.  A.  Support and encouragement offered, medication given as ordered for withdrawal anxiety.  R.  Pt remains safe on the unit, will continue to monitor.
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
## 2015-06-18 NOTE — Tx Team (Signed)
Initial Interdisciplinary Treatment Plan   PATIENT STRESSORS: Financial difficulties Substance abuse   PATIENT STRENGTHS: Ability for insight Average or above average intelligence Capable of independent living Communication skills Motivation for treatment/growth   PROBLEM LIST: Problem List/Patient Goals Date to be addressed Date deferred Reason deferred Estimated date of resolution  " Having suicidal thoughts" 06/18/15     " Drinking daily since Christmas" 06/18/15                                                DISCHARGE CRITERIA:  Ability to meet basic life and health needs Adequate post-discharge living arrangements Improved stabilization in mood, thinking, and/or behavior Withdrawal symptoms are absent or subacute and managed without 24-hour nursing intervention  PRELIMINARY DISCHARGE PLAN: Outpatient therapy Return to previous living arrangement  PATIENT/FAMIILY INVOLVEMENT: This treatment plan has been presented to and reviewed with the patient, Steven [name], and/or family member, .  The patient and family have been given the opportunity to ask questions and make suggestions.  Manuela Schwartz [facility] 06/18/2015, 5:46 PM

## 2015-06-18 NOTE — Progress Notes (Signed)
The patient was late in arriving for group but was appropriate.
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
## 2015-06-19 DIAGNOSIS — R45851 Suicidal ideations: Secondary | ICD-10-CM

## 2015-06-19 DIAGNOSIS — F332 Major depressive disorder, recurrent severe without psychotic features: Principal | ICD-10-CM

## 2015-06-19 MED ORDER — CHLORDIAZEPOXIDE HCL 25 MG PO CAPS
25.0000 mg | ORAL_CAPSULE | Freq: Four times a day (QID) | ORAL | Status: DC | PRN
  Administered 2015-06-19 – 2015-06-28 (×17): 25 mg via ORAL
  Filled 2015-06-19 (×18): qty 1

## 2015-06-19 MED ORDER — HYDROXYZINE HCL 25 MG PO TABS
25.0000 mg | ORAL_TABLET | Freq: Four times a day (QID) | ORAL | Status: AC | PRN
  Administered 2015-06-20: 25 mg via ORAL
  Filled 2015-06-19: qty 1

## 2015-06-19 MED ORDER — VITAMIN B-1 100 MG PO TABS
100.0000 mg | ORAL_TABLET | Freq: Every day | ORAL | Status: DC
  Administered 2015-06-19 – 2015-06-28 (×10): 100 mg via ORAL
  Filled 2015-06-19 (×12): qty 1

## 2015-06-19 MED ORDER — LORAZEPAM 1 MG PO TABS: 1.0000 mg | ORAL_TABLET | Freq: Every day | ORAL | Status: DC

## 2015-06-19 MED ORDER — LORAZEPAM 1 MG PO TABS: 1.0000 mg | ORAL_TABLET | Freq: Two times a day (BID) | ORAL | Status: DC

## 2015-06-19 MED ORDER — LORAZEPAM 1 MG PO TABS
1.0000 mg | ORAL_TABLET | Freq: Four times a day (QID) | ORAL | Status: DC
  Administered 2015-06-19 (×2): 1 mg via ORAL
  Filled 2015-06-19 (×2): qty 1

## 2015-06-19 MED ORDER — LORAZEPAM 1 MG PO TABS: 1.0000 mg | ORAL_TABLET | Freq: Three times a day (TID) | ORAL | Status: DC

## 2015-06-19 MED ORDER — LORAZEPAM 1 MG PO TABS: 1.0000 mg | ORAL_TABLET | Freq: Four times a day (QID) | ORAL | Status: DC | PRN

## 2015-06-19 MED ORDER — ADULT MULTIVITAMIN W/MINERALS CH
1.0000 | ORAL_TABLET | Freq: Every day | ORAL | Status: DC
  Administered 2015-06-19 – 2015-06-28 (×10): 1 via ORAL
  Filled 2015-06-19 (×12): qty 1

## 2015-06-19 MED ORDER — ONDANSETRON 4 MG PO TBDP
4.0000 mg | ORAL_TABLET | Freq: Four times a day (QID) | ORAL | Status: AC | PRN
  Administered 2015-06-19 – 2015-06-20 (×2): 4 mg via ORAL
  Filled 2015-06-19 (×2): qty 1

## 2015-06-19 MED ORDER — LOPERAMIDE HCL 2 MG PO CAPS
2.0000 mg | ORAL_CAPSULE | ORAL | Status: AC | PRN
  Administered 2015-06-19: 2 mg via ORAL
  Filled 2015-06-19: qty 2

## 2015-06-19 NOTE — [facility] Suicide Risk Assessment (Signed)
[facility] Admission Suicide Risk Assessment   Nursing information obtained from:  Patient Demographic factors:  Male, Divorced or widowed, Caucasian, Low socioeconomic status, Unemployed Current Mental Status:  Suicidal ideation indicated by patient, Suicide plan Loss Factors:  Decline in physical health, Financial problems / change in socioeconomic status Historical Factors:  Prior suicide attempts, Family history of mental illness or substance abuse Risk Reduction Factors:  Living with another person, especially a relative  Total Time spent with patient: 45 minutes Principal Problem: MDD (major depressive disorder), recurrent severe, without psychosis (HCC) Diagnosis:   Patient Active Problem List   Diagnosis Date Noted  . MDD (major depressive disorder), recurrent severe, without psychosis (HCC) [F33.2] 06/18/2015  . Cocaine use disorder, moderate, dependence (HCC) [F14.20]   . Alcohol use disorder, moderate, dependence (HCC) [F10.20] 04/02/2015  . Bipolar 1 disorder, depressed (HCC) [F31.9] 04/02/2015  . Alcohol abuse [F10.10] 03/08/2015  . Cocaine abuse [F14.10] 03/08/2015  . Substance induced mood disorder (HCC) [F19.94] 03/08/2015  . Suicidal ideation [R45.851]   . COPD (chronic obstructive pulmonary disease) (HCC) [J44.9] 02/09/2015  . Tobacco use disorder [F17.200] 02/09/2015  . Stimulant use disorder (cocaine) [F15.90] 02/09/2015  . Alcohol use disorder, severe, in sustained remission (HCC) [F10.21] 02/09/2015  . Ureteral stone [N20.1] 12/31/2014  . 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 CAD s/p CABG 2012 [facility] [I25.810] 11/28/2011  . Hyperlipidemia LDL goal <100 [E78.5] 03/13/2007  . Essential hypertension [I10] 03/13/2007  . GERD [K21.9] 03/13/2007   Subjective Data: Patient admitted due to severe depression, suicidal thoughts, using drugs and unable to function  Continued Clinical Symptoms:  Alcohol Use Disorder Identification Test  Final Score (AUDIT): 36 The "Alcohol Use Disorders Identification Test", Guidelines for Use in Primary Care, Second Edition.  World Science writer [facility]). Score between 0-7:  no or low risk or alcohol related problems. Score between 8-15:  moderate risk of alcohol related problems. Score between 16-19:  high risk of alcohol related problems. Score 20 or above:  warrants further diagnostic evaluation for alcohol dependence and treatment.   CLINICAL FACTORS:   Bipolar Disorder:   Mixed State Depression:   Comorbid alcohol abuse/dependence Hopelessness Impulsivity Insomnia Alcohol/Substance Abuse/Dependencies More than one psychiatric diagnosis Unstable or Poor Therapeutic Relationship Previous Psychiatric Diagnoses and Treatments Medical Diagnoses and Treatments/Surgeries   Musculoskeletal: Strength & Muscle Tone: decreased Gait & Station: normal Patient leans: N/A  Psychiatric Specialty Exam: ROS  Blood pressure 135/70, pulse 93, temperature 98.6 F (37 C), temperature source Oral, resp. rate 20, height  (1.753 m), weight 94.802 kg (209 lb), SpO2 96 %.Body mass index is 30.85 kg/(m^2).  General Appearance: Disheveled  Eye Solicitor::  Fair  Speech:  Slow  Volume:  Decreased  Mood:  Anxious, Depressed, Dysphoric, Hopeless and Irritable  Affect:  Constricted and Depressed  Thought Process:  Intact  Orientation:  Full (Time, Place, and Person)  Thought Content:  Rumination  Suicidal Thoughts:  Yes.  without intent/plan  Homicidal Thoughts:  No  Memory:  Immediate;   Fair Recent;   Fair Remote;   Fair  Judgement:  Fair  Insight:  Lacking  Psychomotor Activity:  Decreased  Concentration:  Fair  Recall:  [employer] of Knowledge:Fair  Language: Fair  Akathisia:  No  Handed:  Right  AIMS (if indicated):     Assets:  Communication Skills Desire for Improvement  Sleep:  Number of Hours: 6.25  Cognition: WNL  ADL's:  Intact    COGNITIVE  FEATURES THAT CONTRIBUTE  TO RISK:  Loss of executive function and Polarized thinking    SUICIDE RISK:   Mild:  Suicidal ideation of limited frequency, intensity, duration, and specificity.  There are no identifiable plans, no associated intent, mild dysphoria and related symptoms, good self-control (both objective and subjective assessment), few other risk factors, and identifiable protective factors, including available and accessible social support.  PLAN OF CARE: He is 45 year old Caucasian man who has history of drug use and depression and bipolar disorder.  He relapsed into cocaine use resulting severe depression and having suicidal thoughts.  Patient has multiple health issues.  Patient required psychiatric inpatient treatment and stabilization.  Please see history and physical and treatment plan for more details.  I certify that inpatient services furnished can reasonably be expected to improve the patient's condition.   Geneviene Tesch T., MD 06/19/2015, 2:30 PM

## 2015-06-19 NOTE — [facility] Group Notes (Signed)
[facility] Group Notes: (Clinical Social Work)   06/19/2015      Type of Therapy:  Group Therapy   Participation Level:  Did Not Attend despite MHT prompting   Larren Copes Grossman-Orr, LCSW 06/19/2015, 12:50 PM     
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
## 2015-06-19 NOTE — Progress Notes (Signed)
Patient did attend the evening speaker AA meeting.  

## 2015-06-19 NOTE — Progress Notes (Signed)
D:Pt presents in bed majority of the shift. Reports he was up all night. Pt reports passive SI, but can verbally contract for safety. Pt denies plan to hurt self and reports he feels safe. Pt denies HI. Denies AVH. Pt reports withdrawal symptom of nausea and upset stomach at start of shift, but denies withdrawal symptoms as shift progresses.  A:Special checks q 15 mins in place for safety.Medication administered per MD order (See eMAR) Encouragement and support provided.  R:safety maintained. Compliant with medication regimen. Will continue to monitor.

## 2015-06-19 NOTE — H&P (Signed)
Observation Admission Assessment Adult  Patient Identification: Steven [name]  MRN:  [id number]  Date of Evaluation:  06/19/2015  Chief Complaint:  BIPOLAR POLYSUBSTANCE ABUSE  Principal Diagnosis: MDD (major depressive disorder), recurrent severe, without psychosis ([place])  Diagnosis:   Patient Active Problem List   Diagnosis Date Noted  . MDD (major depressive disorder), recurrent severe, without psychosis ([place]) [F33.2] 06/18/2015  . Cocaine use disorder, moderate, dependence ([place]) [F14.20]   . Alcohol use disorder, moderate, dependence ([place]) [F10.20] 04/02/2015  . Bipolar 1 disorder, depressed ([place]) [F31.9] 04/02/2015  . Alcohol abuse [F10.10] 03/08/2015  . Cocaine abuse [F14.10] 03/08/2015  . Substance induced mood disorder ([place]) [F19.94] 03/08/2015  . Suicidal ideation [R45.851]   . COPD (chronic obstructive pulmonary disease) ([place]) [J44.9] 02/09/2015  . Tobacco use disorder [F17.200] 02/09/2015  . Stimulant use disorder (cocaine) [F15.90] 02/09/2015  . Alcohol use disorder, severe, in sustained remission ([place]) [F10.21] 02/09/2015  . Ureteral stone [N20.1] 12/31/2014  . Claudication of left lower extremity ([place]) [I73.9] 08/14/2012  . CAD s/p CABG 2012 [facility] [I25.810] 11/28/2011  . Hyperlipidemia LDL goal <100 [E78.5] 03/13/2007  . Essential hypertension [I10] 03/13/2007  . GERD [K21.9] 03/13/2007   History of Present Illness: Steven [name] is a 45 year old Caucasian male. Admitted to [facility] from the [facility] ED with complaints of suicidal ideations. He reports that this is his third admission to this hospital, all related to drugs & feeling suicidal. During this assessment, Steven [name] reports, "I rode the bus to the [facility] ED on Wednesday, 3 days ago. I just felt like I could not take any more shit. Every day, it is one thing after another. I got tired of it. It started 6 months ago when I lost my apartment, then my motor cycle, then my job because I did  not have my motor cycle to get to & from work. I did not attempt to hurt myself this time. I had slit my wrist in a suicide attempt 5 years ago. It required stitches to close the wound up. That time, my frustration & depressed were related to girlfriend trouble. My depression is ongoing for many years due to a combination of things (drugs, alcohol & life). I was sober from substances for about 18 months, relapsed last December, 2016 because I was bored. I last used cocaine a couple of days ago. Diagnosed with Bipolar disorder in the past, I'm not on any medications right now. Other than AA meetings, I have not really had a treatment for substance abuse issues. I get a lot of racing thoughts & panic attacks.  Associated Signs/Symptoms: Depression Symptoms:  depressed mood, insomnia, feelings of worthlessness/guilt, hopelessness, suicidal thoughts without plan,  (Hypo) Manic Symptoms:  Denies any hypomanic symptoms  Anxiety Symptoms:  Excessive Worry,  Psychotic Symptoms:  Denies any hallucinations  PTSD Symptoms: None reported  Total Time spent with patient: 1 hour  Past Psychiatric History: Bipolar 1 disorder   Risk to Self: Is patient at risk for suicide?: Yes Risk to Others: No Prior Inpatient Therapy: Yes Prior Outpatient Therapy: Yes  Alcohol Screening: 1. How often do you have a drink containing alcohol?: 4 or more times a week 2. How many drinks containing alcohol do you have on a typical day when you are drinking?: 10 or more 3. How often do you have six or more drinks on one occasion?: Daily or almost daily Preliminary Score: 8 4. How often during the last year have you found  that you were not able to stop drinking once you had started?: Daily or almost daily 5. How often during the last year have you failed to do what was normally expected from you becasue of drinking?: Daily or almost daily 6. How often during the last year have you needed a first drink in the morning to  get yourself going after a heavy drinking session?: Daily or almost daily 7. How often during the last year have you had a feeling of guilt of remorse after drinking?: Weekly 8. How often during the last year have you been unable to remember what happened the night before because you had been drinking?: Weekly 9. Have you or someone else been injured as a result of your drinking?: Yes, but not in the last year 10. Has a relative or friend or a doctor or another health worker been concerned about your drinking or suggested you cut down?: Yes, during the last year Alcohol Use Disorder Identification Test Final Score (AUDIT): 36 Brief Intervention: Yes  Substance Abuse History in the last 12 months:  Yes.    Consequences of Substance Abuse: Medical Consequences:  Liver damage, Possible death by overdose Legal Consequences:  Arrests, jail time, Loss of driving privilege. Family Consequences:  Family discord, divorce and or separation.  Previous Psychotropic Medications: Yes   Psychological Evaluations: No   Past Medical History:  Past Medical History  Diagnosis Date  . Coronary artery disease   . Hypertension   . Hypercholesteremia   . Kidney stone   . Tobacco use disorder   . Hx of CABG   . COPD (chronic obstructive pulmonary disease) ([place])   . Bipolar disorder (manic depression) ([place])   . Asthma     Past Surgical History  Procedure Laterality Date  . Coronary artery bypass graft    . Kidney stone surgery    . Appendectomy    . Lower extremity angiogram N/A 08/15/2012    Procedure: LOWER EXTREMITY ANGIOGRAM with possible PTA /stent;  Surgeon: Jettie Booze, MD;  Location: [facility] CATH LAB;  Service: Cardiovascular;  Laterality: N/A;  . Abdominal angiogram  08/15/2012    Procedure: ABDOMINAL ANGIOGRAM;  Surgeon: Jettie Booze, MD;  Location: [facility] CATH LAB;  Service: Cardiovascular;;  . Cardioversion N/A 08/28/2012    Procedure: Pseudo Compression;  Surgeon: Angelia Mould, MD;  Location: [facility] CATH LAB;  Service: Cardiovascular;  Laterality: N/A;  . Cystoscopy with retrograde pyelogram, ureteroscopy and stent placement Right 12/31/2014    Procedure: CYSTOSCOPY  RIGHT URETEROSCOPY WITH STONE RETREIVAL RIGHT RETROGRADE PYELOGRAM;  Surgeon: Cleon Gustin, MD;  Location: [facility] ORS;  Service: Urology;  Laterality: Right;   Family History:  Family History  Problem Relation Age of Onset  . Heart disease Father   . Hyperlipidemia Father   . Heart disease Maternal Grandmother    Family Psychiatric  History:   Social History:  History  Alcohol Use  . Yes    Comment: last drink 03/07/2015      History  Drug Use  . Yes  . Special: Cocaine, Marijuana    Comment: 10/23//2016    Social History   Social History  . Marital Status: Divorced    Spouse Name: N/A  . Number of Children: N/A  . Years of Education: N/A   Social History Main Topics  . Smoking status: Current Every Day Smoker -- 1.00 packs/day for 31 years    Types: Cigarettes  . Smokeless tobacco: Former Systems developer  Quit date: 04/27/2013  . Alcohol Use: Yes     Comment: last drink 03/07/2015   . Drug Use: Yes    Special: Cocaine, Marijuana     Comment: 10/23//2016  . Sexual Activity: Yes    Birth Control/ Protection: Condom   Other Topics Concern  . None   Social History Narrative   Additional Social History: History of alcohol / drug use?: Yes Longest period of sobriety (when/how long): 18 months within 5 yrs (18 months within 5 yrs) Negative Consequences of Use: Financial Withdrawal Symptoms: Tremors, Nausea / Vomiting, Fever / Chills  Allergies:  No Known Allergies  Lab Results:  Results for orders placed or performed during the hospital encounter of 06/17/15 (from the past 48 hour(s))  Urine rapid drug screen (hosp performed) (Not at [facility])     Status: Abnormal   Collection Time: 06/17/15  1:27 PM  Result Value Ref Range   Opiates NONE DETECTED NONE DETECTED   Cocaine  POSITIVE (A) NONE DETECTED   Benzodiazepines NONE DETECTED NONE DETECTED   Amphetamines NONE DETECTED NONE DETECTED   Tetrahydrocannabinol POSITIVE (A) NONE DETECTED   Barbiturates NONE DETECTED NONE DETECTED    Comment:        DRUG SCREEN FOR MEDICAL PURPOSES ONLY.  IF CONFIRMATION IS NEEDED FOR ANY PURPOSE, NOTIFY LAB WITHIN 5 DAYS.        LOWEST DETECTABLE LIMITS FOR URINE DRUG SCREEN Drug Class       Cutoff (ng/mL) Amphetamine      1000 Barbiturate      200 Benzodiazepine   563 Tricyclics       875 Opiates          300 Cocaine          300 THC              50   Comprehensive metabolic panel     Status: Abnormal   Collection Time: 06/17/15  1:35 PM  Result Value Ref Range   Sodium 135 135 - 145 mmol/L   Potassium 3.9 3.5 - 5.1 mmol/L   Chloride 98 (L) 101 - 111 mmol/L   CO2 21 (L) 22 - 32 mmol/L   Glucose, Bld 115 (H) 65 - 99 mg/dL   BUN 14 6 - 20 mg/dL   Creatinine, Ser 1.11 0.61 - 1.24 mg/dL   Calcium 9.4 8.9 - 10.3 mg/dL   Total Protein 8.1 6.5 - 8.1 g/dL   Albumin 4.7 3.5 - 5.0 g/dL   AST 34 15 - 41 U/L   ALT 26 17 - 63 U/L   Alkaline Phosphatase 91 38 - 126 U/L   Total Bilirubin 1.3 (H) 0.3 - 1.2 mg/dL   GFR calc non Af Amer >60 >60 mL/min   GFR calc Af Amer >60 >60 mL/min    Comment: (NOTE) The eGFR has been calculated using the CKD EPI equation. This calculation has not been validated in all clinical situations. eGFR's persistently <60 mL/min signify possible Chronic Kidney Disease.    Anion gap 16 (H) 5 - 15  Ethanol (ETOH)     Status: None   Collection Time: 06/17/15  1:35 PM  Result Value Ref Range   Alcohol, Ethyl (B) <5 <5 mg/dL    Comment:        LOWEST DETECTABLE LIMIT FOR SERUM ALCOHOL IS 5 mg/dL FOR MEDICAL PURPOSES ONLY   Salicylate level     Status: None   Collection Time: 06/17/15  1:35 PM  Result Value  Ref Range   Salicylate Lvl <0.9 2.8 - 30.0 mg/dL  Acetaminophen level     Status: Abnormal   Collection Time: 06/17/15  1:35 PM   Result Value Ref Range   Acetaminophen (Tylenol), Serum <10 (L) 10 - 30 ug/mL    Comment:        THERAPEUTIC CONCENTRATIONS VARY SIGNIFICANTLY. A RANGE OF 10-30 ug/mL MAY BE AN EFFECTIVE CONCENTRATION FOR MANY PATIENTS. HOWEVER, SOME ARE BEST TREATED AT CONCENTRATIONS OUTSIDE THIS RANGE. ACETAMINOPHEN CONCENTRATIONS >150 ug/mL AT 4 HOURS AFTER INGESTION AND >50 ug/mL AT 12 HOURS AFTER INGESTION ARE OFTEN ASSOCIATED WITH TOXIC REACTIONS.   CBC     Status: None   Collection Time: 06/17/15  1:35 PM  Result Value Ref Range   WBC 8.1 4.0 - 10.5 K/uL   RBC 4.75 4.22 - 5.81 MIL/uL   Hemoglobin 15.9 13.0 - 17.0 g/dL   HCT 46.1 39.0 - 52.0 %   MCV 97.1 78.0 - 100.0 fL   MCH 33.5 26.0 - 34.0 pg   MCHC 34.5 30.0 - 36.0 g/dL   RDW 13.7 11.5 - 15.5 %   Platelets 314 150 - 470 K/uL   Metabolic Disorder Labs:  Lab Results  Component Value Date   HGBA1C 6.5* 04/06/2015   MPG 140 04/06/2015   MPG 97 04/27/2013   Lab Results  Component Value Date   PROLACTIN 18.2* 04/06/2015   Lab Results  Component Value Date   CHOL 284* 04/06/2015   TRIG 378* 04/06/2015   HDL 43 04/06/2015   CHOLHDL 6.6 04/06/2015   VLDL 76* 04/06/2015   LDLCALC 165* 04/06/2015   LDLCALC 114* 02/10/2015   Current Medications: Current Facility-Administered Medications  Medication Dose Route Frequency Provider Last Rate Last Dose  . acetaminophen (TYLENOL) tablet 650 mg  650 mg Oral Q4H PRN Benjamine Mola, [name]   650 mg at 06/19/15 9628  . alum & mag hydroxide-simeth (MAALOX/MYLANTA) 200-200-20 MG/5ML suspension 30 mL  30 mL Oral Q4H PRN Benjamine Mola, [name]      . atorvastatin (LIPITOR) tablet 20 mg  20 mg Oral q1800 Benjamine Mola, [name]   20 mg at 06/18/15 1815  . FLUoxetine (PROZAC) capsule 20 mg  20 mg Oral Daily Benjamine Mola, [name]   20 mg at 06/19/15 3662  . folic acid (FOLVITE) tablet 1 mg  1 mg Oral Daily Benjamine Mola, [name]   1 mg at 06/19/15 0820  . hydrOXYzine (ATARAX/VISTARIL) tablet 25 mg  25 mg  Oral Q6H PRN Nicholaus Bloom, MD      . hydrOXYzine (ATARAX/VISTARIL) tablet 50 mg  50 mg Oral Q6H PRN Benjamine Mola, [name]   50 mg at 06/18/15 2113  . ibuprofen (ADVIL,MOTRIN) tablet 600 mg  600 mg Oral Q8H PRN Benjamine Mola, [name]   600 mg at 06/18/15 2045  . loperamide (IMODIUM) capsule 2-4 mg  2-4 mg Oral PRN Nicholaus Bloom, MD      . LORazepam (ATIVAN) tablet 1 mg  1 mg Oral Q6H PRN Nicholaus Bloom, MD      . LORazepam (ATIVAN) tablet 1 mg  1 mg Oral QID Nicholaus Bloom, MD   1 mg at 06/19/15 0820   Followed by  . [START ON 06/20/2015] LORazepam (ATIVAN) tablet 1 mg  1 mg Oral TID Nicholaus Bloom, MD       Followed by  . [START ON 06/21/2015] LORazepam (ATIVAN) tablet 1 mg  1 mg Oral BID  Nicholaus Bloom, MD       Followed by  . [START ON 06/22/2015] LORazepam (ATIVAN) tablet 1 mg  1 mg Oral Daily Nicholaus Bloom, MD      . lurasidone (LATUDA) tablet 40 mg  40 mg Oral Q breakfast Benjamine Mola, [name]   40 mg at 06/19/15 0820  . magnesium hydroxide (MILK OF MAGNESIA) suspension 30 mL  30 mL Oral Daily PRN Benjamine Mola, [name]      . metFORMIN (GLUCOPHAGE) tablet 500 mg  500 mg Oral Q breakfast Benjamine Mola, [name]   500 mg at 06/19/15 0820  . methocarbamol (ROBAXIN) tablet 500 mg  500 mg Oral BID Benjamine Mola, [name]   500 mg at 06/19/15 0820  . multivitamin with minerals tablet 1 tablet  1 tablet Oral Daily Nicholaus Bloom, MD   1 tablet at 06/19/15 [phone]  . nicotine (NICODERM CQ - dosed in mg/24 hours) patch 21 mg  21 mg Transdermal Daily Benjamine Mola, [name]   21 mg at 06/19/15 9024  . ondansetron (ZOFRAN-ODT) disintegrating tablet 4 mg  4 mg Oral Q6H PRN Nicholaus Bloom, MD      . penicillin v potassium (VEETID) tablet 500 mg  500 mg Oral 4 times per day Benjamine Mola, [name]   500 mg at 06/19/15 0602  . thiamine (B-1) injection 100 mg  100 mg Intravenous Daily Benjamine Mola, [name]   100 mg at 06/19/15 0820  . thiamine (VITAMIN B-1) tablet 100 mg  100 mg Oral Daily Nicholaus Bloom, MD   100 mg at 06/19/15 0820  . traZODone  (DESYREL) tablet 100 mg  100 mg Oral QHS PRN Benjamine Mola, [name]   100 mg at 06/18/15 2113   PTA Medications: Prescriptions prior to admission  Medication Sig Dispense Refill Last Dose  . acetaminophen (TYLENOL) 500 MG tablet Take 1,000 mg by mouth every 6 (six) hours as needed for mild pain or moderate pain.   Past Week at Unknown time  . aspirin 81 MG tablet Take 162 mg by mouth 2 (two) times daily as needed for pain.   Past Week at Unknown time  . atorvastatin (LIPITOR) 20 MG tablet Take 1 tablet (20 mg total) by mouth daily at 6 PM. 7 tablet 0 2 months ago  . FLUoxetine (PROZAC) 20 MG capsule Take 1 capsule (20 mg total) by mouth daily. 7 capsule 0 2 months ago  . HYDROcodone-acetaminophen (NORCO/VICODIN) 5-325 MG tablet Take 1-2 tablets by mouth every 6 (six) hours as needed for moderate pain or severe pain. 15 tablet 0 Past Month at Unknown time  . hydrOXYzine (ATARAX/VISTARIL) 50 MG tablet Take 1 tablet (50 mg total) by mouth every 6 (six) hours as needed for anxiety. 7 tablet 0 2 months ago  . ibuprofen (ADVIL,MOTRIN) 800 MG tablet Take 1 tablet (800 mg total) by mouth 3 (three) times daily. 21 tablet 0 2 months ago  . lidocaine (LIDODERM) 5 % Place 1 patch onto the skin daily. Remove & Discard patch within 12 hours or as directed by MD 30 patch 0 2 months ago  . lurasidone (LATUDA) 40 MG TABS tablet Take 1 tablet (40 mg total) by mouth daily with breakfast. 7 tablet 0 2 months ago  . metFORMIN (GLUCOPHAGE) 500 MG tablet Take 1 tablet (500 mg total) by mouth daily with breakfast. 7 tablet 0 2 months ago  . methocarbamol (ROBAXIN) 500 MG tablet Take 1 tablet (  500 mg total) by mouth 2 (two) times daily. 20 tablet 0 2 months ago  . methocarbamol (ROBAXIN) 500 MG tablet Take 1 tablet (500 mg total) by mouth 2 (two) times daily. 20 tablet 0 2 months ago  . ondansetron (ZOFRAN ODT) 8 MG disintegrating tablet '8mg'$  ODT q4 hours prn nausea 4 tablet 0 2 months ago  . oxyCODONE-acetaminophen  (PERCOCET/ROXICET) 5-325 MG tablet Take 1-2 tablets by mouth every 4 (four) hours as needed for severe pain. (Patient not taking: Reported on 06/17/2015) 6 tablet 0   . traZODone (DESYREL) 100 MG tablet Take 1 tablet (100 mg total) by mouth at bedtime as needed for sleep. 7 tablet 0 2 months ago   Musculoskeletal: Strength & Muscle Tone: within normal limits Gait & Station: normal Patient leans: Right  Psychiatric Specialty Exam: Physical Exam  Constitutional: He is oriented to person, place, and time. He appears well-developed.  HENT:  Head: Normocephalic.  Eyes: Pupils are equal, round, and reactive to light.  Neck: Normal range of motion.  Cardiovascular:  Elevated blood pressure  Respiratory: Effort normal.  GI: Soft.  Genitourinary:  Denies any issues in this area  Musculoskeletal: Normal range of motion.  Neurological: He is alert and oriented to person, place, and time.  Skin: Skin is warm and dry.  Psychiatric: His speech is normal and behavior is normal. Judgment and thought content normal. His mood appears anxious. His affect is not angry, not blunt, not labile and not inappropriate. Cognition and memory are normal. He exhibits a depressed mood.    Review of Systems  Constitutional: Positive for malaise/fatigue and diaphoresis.  HENT: Negative.   Eyes: Negative.   Respiratory: Negative.   Cardiovascular:       Elevated blood pressure  Gastrointestinal: Negative.   Genitourinary: Negative.   Musculoskeletal: Negative.   Skin: Negative.   Neurological: Positive for weakness.  Endo/Heme/Allergies: Negative.   Psychiatric/Behavioral: Positive for depression, suicidal ideas (Denies intent or plans) and substance abuse (Alcohol use disorder). Negative for hallucinations and memory loss. The patient is nervous/anxious and has insomnia.     Blood pressure 149/73, pulse 99, temperature 98.3 F (36.8 C), temperature source Oral, resp. rate 16, height '5\' 9"'$  (1.753 m), weight  94.802 kg (209 lb).Body mass index is 30.85 kg/(m^2).  General Appearance: Disheveled  Eye Sport and exercise psychologist::  Fair  Speech:  Clear and Coherent and Normal Rate  Volume:  Normal  Mood:  Anxious and Depressed  Affect:  Appropriate, Congruent and Flat  Thought Process:  Coherent, Goal Directed and Logical  Orientation:  Full (Time, Place, and Person)  Thought Content:  Rumination, denies any hallucinations.  Suicidal Thoughts:  Yes.  without intent/plan  Homicidal Thoughts:  No  Memory:  Immediate;   Good Recent;   Good Remote;   Good  Judgement:  Fair  Insight:  Fair  Psychomotor Activity:  Increased, rates anxiety #10  Concentration:  Fair  Recall:  [employer] of Knowledge:Fair  Language: Good  Akathisia:  No  Handed:  Right  AIMS (if indicated):     Assets:  Communication Skills Desire for Improvement Resilience  ADL's:  Intact  Cognition: WNL  Sleep: 4.6   Observation Level/Precautions:  Continuous Observation 15 minute checks  Laboratory:  Per ED  Psychotherapy: Group sessions, AA/NA meetings  Medications: Hydroxyzine 25 & 50 mg respectively anxiety, Latuda 40 mg for mood control & Trazodone 100 mg for insomnia.   Consultations: As needed  Discharge Concerns:  Safety, sobriety  Estimated  LOS: 2-4 days  Other: Admit to the 300 hall.   Treatment Plan/Recommendations: 1. Admit for crisis management and stabilization, estimated length of stay 3-5 days.  2. Medication management to reduce current symptoms to base line and improve the patient's overall level of functioning; Librium 25 mg prn for acute withdrawal syndrome, Fluoxetine 20 mg for depression, Hydroxyzine 25 & 50 mg respectively anxiety, Latuda 40 mg for mood control & Trazodone 100 mg for insomnia.  3. Treat health problems as indicated. 4. Develop treatment plan to decrease risk of relapse upon discharge and the need for readmission.  5. Psycho-social education regarding relapse prevention and self care.  6. Health  care follow up as needed for medical problems.  7. Review, reconcile, and reinstate any pertinent home medications for other health issues where appropriate; Lipitor 20 mg for high cholesterol, Metformin 500 mg for diabetes management 8. Call for consults with hospitalist for any additional specialty patient care services as needed.  Lindell Spar I, Ages 2/4/201711:34 AM  Patient seen face to face for psychiatric evaluation. Chart reviewed and finding discussed with Physician extender. Agreed with disposition and treatment plan.   Berniece Andreas, MD

## 2015-06-19 NOTE — [facility] Suicide Risk Assessment (Signed)
[facility] INPATIENT:  Family/Significant Other Suicide Prevention Education  Suicide Prevention Education:  Patient Refusal for Family/Significant Other Suicide Prevention Education: The patient Steven [name] has refused to provide written consent for family/significant other to be provided Family/Significant Other Suicide Prevention Education during admission and/or prior to discharge.  Physician notified.  Suicide Prevention Education brochure reviewed with patient, provided to him.  Sarina Ser 06/19/2015, 2:54 PM

## 2015-06-19 NOTE — [facility] Group Notes (Signed)
[facility] Group Notes:  (Nursing/MHT/Case Management/Adjunct)  Date:  06/19/2015  Time:  0845 am  Type of Therapy:  Psychoeducational Skills  Participation Level:  Did Not Attend  Patient invited; declined to attend.  [place] Mon 06/19/2015, 9:18 AM

## 2015-06-20 LAB — GLUCOSE, CAPILLARY: GLUCOSE-CAPILLARY: 104 mg/dL — AB (ref 65–99)

## 2015-06-20 MED ORDER — QUETIAPINE FUMARATE 100 MG PO TABS
100.0000 mg | ORAL_TABLET | Freq: Every day | ORAL | Status: DC
  Administered 2015-06-20 – 2015-06-27 (×8): 100 mg via ORAL
  Filled 2015-06-20 (×11): qty 1

## 2015-06-20 MED ORDER — QUETIAPINE FUMARATE 25 MG PO TABS
25.0000 mg | ORAL_TABLET | Freq: Three times a day (TID) | ORAL | Status: DC
  Administered 2015-06-20 – 2015-06-28 (×25): 25 mg via ORAL
  Filled 2015-06-20 (×33): qty 1

## 2015-06-20 NOTE — Progress Notes (Signed)
Pt reports he is still feeling about the same and having moderate withdrawal symptoms, but he does not have visible tremors.  He asks for prn medications frequently.  He wanted his Vistaril along with the Seroquel and the Trazodone at bedtime, but writer was able to convince the pt that those medications worked similar to [employer] and hold off taking the Vistaril unless he needed it later.  He denies HI/AVH at this time, but still has passive suicidal thoughts.  He contracts for [employer].  Pt has been observed in the dayroom at various times tonight watching the football game.  He makes his needs known to staff, and he has been appropriate on the unit tonight.  Support and encouragement offered.  Safety maintained with q15 minute checks.

## 2015-06-20 NOTE — Progress Notes (Signed)
[facility] MD Progress Note  06/20/2015 3:57 PM Steven [name]  MRN:  [id number]  Subjective:  Steven [name] reports, "I feel terrible. I did not sleep last night. I'm having the worst experience in this hospital this time around. I'm not getting the Ativan pills. I still feel suicidal. I have manic depression, the medicine that I'm on for it does not work. I feel really bad". Steven [name] denies any plans or intent to hurt himself or others.   Principal Problem: MDD (major depressive disorder), recurrent severe, without psychosis (HCC)  Diagnosis:   Patient Active Problem List   Diagnosis Date Noted  . MDD (major depressive disorder), recurrent severe, without psychosis (HCC) [F33.2] 06/18/2015  . Cocaine use disorder, moderate, dependence (HCC) [F14.20]   . Alcohol use disorder, moderate, dependence (HCC) [F10.20] 04/02/2015  . Bipolar 1 disorder, depressed (HCC) [F31.9] 04/02/2015  . Alcohol abuse [F10.10] 03/08/2015  . Cocaine abuse [F14.10] 03/08/2015  . Substance induced mood disorder (HCC) [F19.94] 03/08/2015  . Suicidal ideation [R45.851]   . COPD (chronic obstructive pulmonary disease) (HCC) [J44.9] 02/09/2015  . Tobacco use disorder [F17.200] 02/09/2015  . Stimulant use disorder (cocaine) [F15.90] 02/09/2015  . Alcohol use disorder, severe, in sustained remission (HCC) [F10.21] 02/09/2015  . Ureteral stone [N20.1] 12/31/2014  . 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 CAD s/p CABG 2012 [facility] [I25.810] 11/28/2011  . Hyperlipidemia LDL goal <100 [E78.5] 03/13/2007  . Essential hypertension [I10] 03/13/2007  . GERD [K21.9] 03/13/2007   Total Time spent with patient: 25 minutes  Past Psychiatric History: Bipolar 1 disorder, Hx. Alcohol use disorder  Past Medical History:  Past Medical History  Diagnosis Date  . Coronary artery disease   . Hypertension   . Hypercholesteremia   . Kidney stone   . Tobacco use disorder   . Hx of CABG   . COPD (chronic  obstructive pulmonary disease) (HCC)   . Bipolar disorder (manic depression) (HCC)   . Asthma     Past Surgical History  Procedure Laterality Date  . Coronary artery bypass graft    . Kidney stone surgery    . Appendectomy    . Lower extremity angiogram N/A 08/15/2012    Procedure: LOWER EXTREMITY ANGIOGRAM with possible PTA /stent;  Surgeon: Corky Crafts, MD;  Location: [facility] CATH LAB;  Service: Cardiovascular;  Laterality: N/A;  . Abdominal angiogram  08/15/2012    Procedure: ABDOMINAL ANGIOGRAM;  Surgeon: Corky Crafts, MD;  Location: [facility] CATH LAB;  Service: Cardiovascular;;  . Cardioversion N/A 08/28/2012    Procedure: Pseudo Compression;  Surgeon: Chuck Hint, MD;  Location: [facility] CATH LAB;  Service: Cardiovascular;  Laterality: N/A;  . Cystoscopy with retrograde pyelogram, ureteroscopy and stent placement Right 12/31/2014    Procedure: CYSTOSCOPY  RIGHT URETEROSCOPY WITH STONE RETREIVAL RIGHT RETROGRADE PYELOGRAM;  Surgeon: Malen Gauze, MD;  Location: [facility] ORS;  Service: Urology;  Laterality: Right;   Family History:  Family History  Problem Relation Age of Onset  . Heart disease Father   . Hyperlipidemia Father   . Heart disease Maternal Grandmother    Family Psychiatric  History: See H&P  Social History:  History  Alcohol Use  . Yes    Comment: last drink 03/07/2015      History  Drug Use  . Yes  . Special: Cocaine, Marijuana    Comment: 10/23//2016    Social History   Social History  . Marital Status: Divorced    Spouse Name:  N/A  . Number of Children: N/A  . Years of Education: N/A   Social History Main Topics  . Smoking status: Current Every Day Smoker -- 1.00 packs/day for 31 years    Types: Cigarettes  . Smokeless tobacco: Former Neurosurgeon    Quit date: 04/27/2013  . Alcohol Use: Yes     Comment: last drink 03/07/2015   . Drug Use: Yes    Special: Cocaine, Marijuana     Comment: 10/23//2016  . Sexual Activity: Yes    Birth Control/  Protection: Condom   Other Topics Concern  . None   Social History Narrative   Additional Social History:    History of alcohol / drug use?: Yes Longest period of sobriety (when/how long): 18 months within 5 yrs (18 months within 5 yrs) Negative Consequences of Use: Financial Withdrawal Symptoms: Tremors, Nausea / Vomiting, Fever / Chills  Sleep: "Poor, I did not sleep last night"  Appetite:  Fair  Current Medications: Current Facility-Administered Medications  Medication Dose Route Frequency Provider Last Rate Last Dose  . acetaminophen (TYLENOL) tablet 650 mg  650 mg Oral Q4H PRN Beau Fanny, [name]   650 mg at 06/20/15 1307  . alum & mag hydroxide-simeth (MAALOX/MYLANTA) 200-200-20 MG/5ML suspension 30 mL  30 mL Oral Q4H PRN Beau Fanny, [name]   30 mL at 06/20/15 [phone]  . atorvastatin (LIPITOR) tablet 20 mg  20 mg Oral q1800 Beau Fanny, [name]   20 mg at 06/19/15 1807  . chlordiazePOXIDE (LIBRIUM) capsule 25 mg  25 mg Oral QID PRN Sanjuana Kava, NP   25 mg at 06/20/15 0830  . FLUoxetine (PROZAC) capsule 20 mg  20 mg Oral Daily Beau Fanny, [name]   20 mg at 06/20/15 9604  . folic acid (FOLVITE) tablet 1 mg  1 mg Oral Daily Beau Fanny, [name]   1 mg at 06/20/15 5409  . hydrOXYzine (ATARAX/VISTARIL) tablet 25 mg  25 mg Oral Q6H PRN Rachael Fee, MD   25 mg at 06/20/15 1307  . hydrOXYzine (ATARAX/VISTARIL) tablet 50 mg  50 mg Oral Q6H PRN Beau Fanny, [name]   50 mg at 06/19/15 2339  . ibuprofen (ADVIL,MOTRIN) tablet 600 mg  600 mg Oral Q8H PRN Beau Fanny, [name]   600 mg at 06/20/15 0830  . loperamide (IMODIUM) capsule 2-4 mg  2-4 mg Oral PRN Rachael Fee, MD   2 mg at 06/19/15 1135  . lurasidone (LATUDA) tablet 40 mg  40 mg Oral Q breakfast Beau Fanny, [name]   40 mg at 06/20/15 8119  . magnesium hydroxide (MILK OF MAGNESIA) suspension 30 mL  30 mL Oral Daily PRN Beau Fanny, [name]      . metFORMIN (GLUCOPHAGE) tablet 500 mg  500 mg Oral Q breakfast Beau Fanny, [name]   500  mg at 06/20/15 1478  . methocarbamol (ROBAXIN) tablet 500 mg  500 mg Oral BID Beau Fanny, [name]   500 mg at 06/20/15 2956  . multivitamin with minerals tablet 1 tablet  1 tablet Oral Daily Rachael Fee, MD   1 tablet at 06/20/15 [phone]  . nicotine (NICODERM CQ - dosed in mg/24 hours) patch 21 mg  21 mg Transdermal Daily Beau Fanny, [name]   21 mg at 06/20/15 8657  . ondansetron (ZOFRAN-ODT) disintegrating tablet 4 mg  4 mg Oral Q6H PRN Rachael Fee, MD   4 mg at 06/19/15 1135  . penicillin  v potassium (VEETID) tablet 500 mg  500 mg Oral 4 times per day Beau Fanny, [name]   500 mg at 06/20/15 1304  . QUEtiapine (SEROQUEL) tablet 100 mg  100 mg Oral QHS Sanjuana Kava, NP      . QUEtiapine (SEROQUEL) tablet 25 mg  25 mg Oral TID Sanjuana Kava, NP      . thiamine (B-1) injection 100 mg  100 mg Intravenous Daily Beau Fanny, [name]   Stopped at 06/20/15 0800  . thiamine (VITAMIN B-1) tablet 100 mg  100 mg Oral Daily Rachael Fee, MD   100 mg at 06/20/15 1610  . traZODone (DESYREL) tablet 100 mg  100 mg Oral QHS PRN Beau Fanny, [name]   100 mg at 06/19/15 2146    Lab Results: No results found for this or any previous visit (from the past 48 hour(s)).  Physical Findings: AIMS: Facial and Oral Movements Muscles of Facial Expression: None, normal Lips and Perioral Area: None, normal Jaw: None, normal Tongue: None, normal,Extremity Movements Upper (arms, wrists, hands, fingers): None, normal Lower (legs, knees, ankles, toes): None, normal, Trunk Movements Neck, shoulders, hips: None, normal, Overall Severity Severity of abnormal movements (highest score from questions above): None, normal Incapacitation due to abnormal movements: None, normal Patient's awareness of abnormal movements (rate only patient's report): No Awareness, Dental Status Current problems with teeth and/or dentures?: Yes Does patient usually wear dentures?: No  CIWA:  CIWA-Ar Total: 8 COWS:      Musculoskeletal: Strength & Muscle Tone: within normal limits Gait & Station: normal Patient leans: N/A  Psychiatric Specialty Exam: Review of Systems  Eyes: Negative.   Psychiatric/Behavioral: Positive for depression, suicidal ideas (Denies any intent or plans) and substance abuse. Negative for hallucinations and memory loss. The patient is nervous/anxious and has insomnia.     Blood pressure 140/79, pulse 101, temperature 97.6 F (36.4 C), temperature source Oral, resp. rate 20, height 5\' 9"  (1.753 m), weight 94.802 kg (209 lb), SpO2 100 %.Body mass index is 30.85 kg/(m^2).  General Appearance: Disheveled  Eye Solicitor:: Fair  Speech: Clear and Coherent and Normal Rate  Volume: Normal  Mood: Anxious and Depressed  Affect: Appropriate, Congruent and Flat  Thought Process: Coherent, Goal Directed and Logical  Orientation: Full (Time, Place, and Person)  Thought Content: Rumination, denies any hallucinations.  Suicidal Thoughts: Yes. without intent/plan  Homicidal Thoughts: No  Memory: Immediate; Good Recent; Good Remote; Good  Judgement: Fair  Insight: Fair  Psychomotor Activity: Increased, rates anxiety #10  Concentration: Fair  Recall: [employer] of Knowledge:Fair  Language: Good  Akathisia: No  Handed: Right  AIMS (if indicated):    Assets: Communication Skills Desire for Improvement Resilience  ADL's: Intact  Cognition: WNL  Sleep: 4         Treatment Summary/Plan: 1. Continue crisis management, mood stabilization & relapse prevention.. 2. Continue current medication management to reduce current symptoms to base line and improve the  patient's overall level of functioning; Fluoxetine 20 mg for depression, continue Hydroxyzine 25 mg for anxiety, Librium 25 mg PRN for substance withdrawal symptoms, discontinue Latuda, patient says not helping with insomnia, continue Trazodone 100 mg for insomnia, initiate  Seroquel 100 mg Q bedtime for mood control & 25 mg tid for agitation. 3. Treat health problems as indicated; resumed Lipitor 40 mg for hyperlipidemia, Metformin 500 mg for DM. PCN V Potassium 500 mg for infection. 4. Develop treatment plan to enhance medication adeherance upon discharge  and the need for  readmission. 5. Psycho-social education regarding relapse prevention and self care.   Armandina Stammer I, NP, PMHNP-BC 06/20/2015, 3:57 PM I reviewed chart and agreed with the findings and treatment Plan.  Kathryne Sharper, MD

## 2015-06-20 NOTE — Progress Notes (Signed)
D: Patient observed in day room with peers. Patient states his goal for day was to " make it all day without hurting self." Patient did contract for safety and agree to find this Clinical research associate or other staff if he felt like harming himself. Patient taking medications as ordered. And attending group.  A: Support and encouragement offered. Q 15 minute checks in progress and maintained.  R: Patient remains safe on unit and monitoring continues.

## 2015-06-20 NOTE — [facility] Group Notes (Signed)
[facility] Group Notes:  Healthy coping skills  Date:  06/20/2015  Time:  1300  Type of Therapy:  Nurse Education  Participation Level:  Did Not Attend  Participation Quality:  Inattentive  Affect:  Flat  Cognitive:  Lacking  Insight:  None  Engagement in Group:  None  Modes of Intervention:  Discussion  Summary of Progress/Problems: Pt did not attend,. Rodman [facility] 06/20/2015, 2:21 PM

## 2015-06-20 NOTE — Progress Notes (Signed)
Nursing Shift Assessment:  Patient requiring Nurse to go to his room twice to tell him he had meds due before patient coming up to med window. Patient states he did not "get much sleep" last night due to "noise from roommate and staff" and states that is why he is so groggy now. Patient is cooperative, is c/o'g that "nothing they're doing for me here is helping". I still think about "taking myself out". Nurse asking pt if he has a plan currently and he states "not exactly". Patient agrees to contract for safety in that he agrees to alert Nurse first before acting to harm self if he should develop any such thoughts of doing so. Patient denies any HI or AVH, rates his depression at a "10" on Self Inventory Sheet and anxiety also at a "10". Nurse administering Motrin and Librium for the pain and anxiety. Patient states his goal for the day is "to make it through the day" and he will "try to maintain" in order to accomplish this goal. Patient stating he has not been involved in his discharge plan per his Self Inventory Sheet.

## 2015-06-20 NOTE — [facility] Group Notes (Signed)
[facility] Group Notes:  (Clinical Social Work)  06/20/2015  10:00-11:00AM  Summary of Progress/Problems:   The main focus of today's process group was to   1)  Talk about 4 definitions of support  2)  Discuss how sometimes the support we want from someone is not possible, but that does not mean those people do not support [place] in another way  3)  Define health supports versus unhealthy supports  4)  Discuss the importance of adding healthy supports and deciding how to deal with unhealthy supports  The patient expressed little during group, and kept entering/leaving the room.  While in the room, he kept his eyes closed.  Type of Therapy:  Process Group with Motivational Interviewing  Participation Level:  Minimal  Participation Quality:  Inattentive  Affect:  Blunted  Cognitive:  Unable to assess  Insight:  Limited  Engagement in Therapy:  Limited  Modes of Intervention:   Education, Support and Processing, Activity  Steven Mantle, LCSW 06/20/2015

## 2015-06-21 ENCOUNTER — Encounter (HOSPITAL_COMMUNITY): Payer: Self-pay | Admitting: Psychiatry

## 2015-06-21 MED ORDER — BUSPIRONE HCL 5 MG PO TABS: 5.0000 mg | ORAL_TABLET | Freq: Three times a day (TID) | ORAL | Status: DC

## 2015-06-21 MED ORDER — BUSPIRONE HCL 5 MG PO TABS
5.0000 mg | ORAL_TABLET | Freq: Three times a day (TID) | ORAL | Status: DC
Start: 2015-06-21 — End: 2015-06-22
  Administered 2015-06-21 – 2015-06-22 (×2): 5 mg via ORAL
  Filled 2015-06-21 (×7): qty 1

## 2015-06-21 NOTE — Progress Notes (Signed)
[facility] MD Progress Note  06/21/2015 2:10 PM Steven [name]  MRN:  [id number] Subjective:  Steven [name] endorses that he is having a hard time.  He left here last time (12/9-12/11) states he was not able to go the follow up appointment. Was on Latuda and Prozac what he thinks did not work. He continues to endorse and anxiety wanting "something for it." he states he got "so depressed" that he started "self medicating" He states he does not know how to get his life in the right track. States he "self medicates" with alcohol and cocaine. States he uses the alcohol to deal with "the pain". Principal Problem: MDD (major depressive disorder), recurrent severe, without psychosis (HCC) Diagnosis:   Patient Active Problem List   Diagnosis Date Noted  . MDD (major depressive disorder), recurrent severe, without psychosis (HCC) [F33.2] 06/18/2015  . Cocaine use disorder, moderate, dependence (HCC) [F14.20]   . Alcohol use disorder, moderate, dependence (HCC) [F10.20] 04/02/2015  . Bipolar 1 disorder, depressed (HCC) [F31.9] 04/02/2015  . Alcohol abuse [F10.10] 03/08/2015  . Cocaine abuse [F14.10] 03/08/2015  . Substance induced mood disorder (HCC) [F19.94] 03/08/2015  . Suicidal ideation [R45.851]   . COPD (chronic obstructive pulmonary disease) (HCC) [J44.9] 02/09/2015  . Tobacco use disorder [F17.200] 02/09/2015  . Stimulant use disorder (cocaine) [F15.90] 02/09/2015  . Alcohol use disorder, severe, in sustained remission (HCC) [F10.21] 02/09/2015  . Ureteral stone [N20.1] 12/31/2014  . 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 CAD s/p CABG 2012 [facility] [I25.810] 11/28/2011  . Hyperlipidemia LDL goal <100 [E78.5] 03/13/2007  . Essential hypertension [I10] 03/13/2007  . GERD [K21.9] 03/13/2007   Total Time spent with patient: 20 minutes  Past Psychiatric History: see admission H and P  Past Medical History:  Past Medical History  Diagnosis Date  . Coronary artery disease    . Hypertension   . Hypercholesteremia   . Kidney stone   . Tobacco use disorder   . Hx of CABG   . COPD (chronic obstructive pulmonary disease) (HCC)   . Bipolar disorder (manic depression) (HCC)   . Asthma     Past Surgical History  Procedure Laterality Date  . Coronary artery bypass graft    . Kidney stone surgery    . Appendectomy    . Lower extremity angiogram N/A 08/15/2012    Procedure: LOWER EXTREMITY ANGIOGRAM with possible PTA /stent;  Surgeon: Corky Crafts, MD;  Location[facility] CATH LAB;  Service: Cardiovascular;  Laterality: N/A;  . Abdominal angiogram  08/15/2012    Procedure: ABDOMINAL ANGIOGRAM;  Surgeon: Corky Crafts, MD;  Location: [facility] CATH LAB;  Service: Cardiovascular;;  . Cardioversion N/A 08/28/2012    Procedure: Pseudo Compression;  Surgeon: Chuck Hint, MD;  Location: [facility] CATH LAB;  Service: Cardiovascular;  Laterality: N/A;  . Cystoscopy with retrograde pyelogram, ureteroscopy and stent placement Right 12/31/2014    Procedure: CYSTOSCOPY  RIGHT URETEROSCOPY WITH STONE RETREIVAL RIGHT RETROGRADE PYELOGRAM;  Surgeon: Malen Gauze, MD;  Location: [facility] ORS;  Service: Urology;  Laterality: Right;   Family History:  Family History  Problem Relation Age of Onset  . Heart disease Father   . Hyperlipidemia Father   . Heart disease Maternal Grandmother    Family Psychiatric  History: see admission H and P Social History:  History  Alcohol Use  . Yes    Comment: last drink 03/07/2015      History  Drug Use  . Yes  .  Special: Cocaine, Marijuana    Comment: 10/23//2016    Social History   Social History  . Marital Status: Divorced    Spouse Name: N/A  . Number of Children: N/A  . Years of Education: N/A   Social History Main Topics  . Smoking status: Current Every Day Smoker -- 1.00 packs/day for 31 years    Types: Cigarettes  . Smokeless tobacco: Former Neurosurgeon    Quit date: 04/27/2013  . Alcohol Use: Yes     Comment: last drink  03/07/2015   . Drug Use: Yes    Special: Cocaine, Marijuana     Comment: 10/23//2016  . Sexual Activity: Yes    Birth Control/ Protection: Condom   Other Topics Concern  . None   Social History Narrative   Additional Social History:    History of alcohol / drug use?: Yes Longest period of sobriety (when/how long): 18 months within 5 yrs (18 months within 5 yrs) Negative Consequences of Use: Financial Withdrawal Symptoms: Tremors, Nausea / Vomiting, Fever / Chills                    Sleep: Poor  Appetite:  Fair  Current Medications: Current Facility-Administered Medications  Medication Dose Route Frequency Provider Last Rate Last Dose  . acetaminophen (TYLENOL) tablet 650 mg  650 mg Oral Q4H PRN Beau Fanny, [name]   650 mg at 06/20/15 1307  . alum & mag hydroxide-simeth (MAALOX/MYLANTA) 200-200-20 MG/5ML suspension 30 mL  30 mL Oral Q4H PRN Beau Fanny, [name]   30 mL at 06/21/15 0326  . atorvastatin (LIPITOR) tablet 20 mg  20 mg Oral q1800 Beau Fanny, [name]   20 mg at 06/20/15 1725  . chlordiazePOXIDE (LIBRIUM) capsule 25 mg  25 mg Oral QID PRN Sanjuana Kava, NP   25 mg at 06/20/15 1606  . FLUoxetine (PROZAC) capsule 20 mg  20 mg Oral Daily Beau Fanny, [name]   20 mg at 06/21/15 0846  . folic acid (FOLVITE) tablet 1 mg  1 mg Oral Daily Beau Fanny, [name]   1 mg at 06/21/15 0846  . hydrOXYzine (ATARAX/VISTARIL) tablet 25 mg  25 mg Oral Q6H PRN Rachael Fee, MD   25 mg at 06/20/15 1307  . hydrOXYzine (ATARAX/VISTARIL) tablet 50 mg  50 mg Oral Q6H PRN Beau Fanny, [name]   50 mg at 06/21/15 0327  . ibuprofen (ADVIL,MOTRIN) tablet 600 mg  600 mg Oral Q8H PRN Beau Fanny, [name]   600 mg at 06/20/15 1728  . loperamide (IMODIUM) capsule 2-4 mg  2-4 mg Oral PRN Rachael Fee, MD   2 mg at 06/19/15 1135  . magnesium hydroxide (MILK OF MAGNESIA) suspension 30 mL  30 mL Oral Daily PRN Beau Fanny, [name]      . metFORMIN (GLUCOPHAGE) tablet 500 mg  500 mg Oral Q breakfast  Beau Fanny, [name]   500 mg at 06/21/15 0846  . methocarbamol (ROBAXIN) tablet 500 mg  500 mg Oral BID Beau Fanny, [name]   500 mg at 06/21/15 9528  . multivitamin with minerals tablet 1 tablet  1 tablet Oral Daily Rachael Fee, MD   1 tablet at 06/21/15 0846  . nicotine (NICODERM CQ - dosed in mg/24 hours) patch 21 mg  21 mg Transdermal Daily Beau Fanny, [name]   21 mg at 06/21/15 0846  . ondansetron (ZOFRAN-ODT) disintegrating tablet 4 mg  4 mg Oral Q6H  PRN Rachael Fee, MD   4 mg at 06/20/15 1606  . penicillin v potassium (VEETID) tablet 500 mg  500 mg Oral 4 times per day Beau Fanny, [name]   500 mg at 06/21/15 1151  . QUEtiapine (SEROQUEL) tablet 100 mg  100 mg Oral QHS Sanjuana Kava, NP   100 mg at 06/20/15 2137  . QUEtiapine (SEROQUEL) tablet 25 mg  25 mg Oral TID Sanjuana Kava, NP   25 mg at 06/21/15 1151  . thiamine (VITAMIN B-1) tablet 100 mg  100 mg Oral Daily Rachael Fee, MD   100 mg at 06/21/15 0846  . traZODone (DESYREL) tablet 100 mg  100 mg Oral QHS PRN Beau Fanny, [name]   100 mg at 06/20/15 2137    Lab Results:  Results for orders placed or performed during the hospital encounter of 06/18/15 (from the past 48 hour(s))  Glucose, capillary     Status: Abnormal   Collection Time: 06/20/15  9:10 PM  Result Value Ref Range   Glucose-Capillary 104 (H) 65 - 99 mg/dL   Comment 1 Notify RN    Comment 2 Document in Chart     Physical Findings: AIMS: Facial and Oral Movements Muscles of Facial Expression: None, normal Lips and Perioral Area: None, normal Jaw: None, normal Tongue: None, normal,Extremity Movements Upper (arms, wrists, hands, fingers): None, normal Lower (legs, knees, ankles, toes): None, normal, Trunk Movements Neck, shoulders, hips: None, normal, Overall Severity Severity of abnormal movements (highest score from questions above): None, normal Incapacitation due to abnormal movements: None, normal Patient's awareness of abnormal movements (rate only  patient's report): No Awareness, Dental Status Current problems with teeth and/or dentures?: Yes Does patient usually wear dentures?: No  CIWA:  CIWA-Ar Total: 2 COWS:     Musculoskeletal: Strength & Muscle Tone: within normal limits Gait & Station: normal Patient leans: normal  Psychiatric Specialty Exam: Review of Systems  Constitutional: Positive for malaise/fatigue.  HENT:       Tooth pain  Eyes: Negative.   Respiratory: Positive for cough and shortness of breath.        Pack a day  Cardiovascular: Positive for chest pain.  Gastrointestinal: Positive for nausea, vomiting and diarrhea.  Genitourinary: Negative.   Musculoskeletal: Positive for back pain.  Skin: Negative.   Neurological: Positive for dizziness, weakness and headaches.  Endo/Heme/Allergies: Negative.   Psychiatric/Behavioral: Positive for depression, suicidal ideas and substance abuse. The patient is nervous/anxious.     Blood pressure 132/78, pulse 83, temperature 97.7 F (36.5 C), temperature source Oral, resp. rate 20, height 5\' 9"  (1.753 m), weight 94.802 kg (209 lb), SpO2 100 %.Body mass index is 30.85 kg/(m^2).  General Appearance: Disheveled  Eye Solicitor::  Fair  Speech:  Clear and Coherent and Slow  Volume:  fluctuates  Mood:  Anxious, Depressed and Dysphoric  Affect:  anxious worried  Thought Process:  Coherent and Goal Directed  Orientation:  Full (Time, Place, and Person)  Thought Content:  symptoms events worries concerns  Suicidal Thoughts:  Yes.  without intent/plan  Homicidal Thoughts:  No  Memory:  Immediate;   Fair Recent;   Fair Remote;   Fair  Judgement:  Fair  Insight:  Shallow  Psychomotor Activity:  Restlessness  Concentration:  Fair  Recall:  [employer] of Knowledge:Fair  Language: Fair  Akathisia:  No  Handed:  Right  AIMS (if indicated):     Assets:  Desire for Improvement  ADL's:  Intact  Cognition: WNL  Sleep:  Number of Hours: 4.5   Treatment Plan Summary: Daily  contact with patient to assess and evaluate symptoms and progress in treatment and Medication management  Supportive approach/coping skills Cocaine abuse-dependence; will work a relapse prevention plan Depression; will continue the Prozac 20 mg daily and optimize dose response Mood instability-anxiety-agitation; will continue to optimize use of Seroquel 25/100 mg Anxiety; will add Buspar 5 mg TID with plans to increase to 10 mg TID Will work to create insight in terms of his persistent use of cocaine that he minimizes and it being a factor in his mood instability Will work with CBT/mindfulness Will explore residential treatment options  Doretha Goding A, MD 06/21/2015, 2:10 PM

## 2015-06-21 NOTE — Progress Notes (Signed)
[facility] referral made at patient's request.   Samuella Bruin, LCSW Clinical Social Worker [facility] [phone]

## 2015-06-21 NOTE — [facility] Group Notes (Signed)
[facility] LCSW Group Therapy 06/21/2015  1:15 PM   Type of Therapy: Group Therapy  Participation Level: Did Not Attend. Patient invited to participate but declined.   Osiris Odriscoll, MSW, LCSW Clinical Social Worker [facility] [phone]   

## 2015-06-21 NOTE — BHH Group Notes (Signed)
[facility] LCSW Aftercare Discharge Planning Group Note   06/21/2015 11:45 AM  Participation Quality:  Invited. DID NOT ATTEND. Pt chose to remain in bed this morning.   Smart, Steven Wands LCSW

## 2015-06-21 NOTE — BHH Group Notes (Signed)
Adult Psychoeducational Group Note  Date:  06/21/2015 Time:  9:53 PM  Group Topic/Focus:  AA Meeting  Participation Level:  Minimal  Participation Quality:  Attentive  Affect:  Appropriate  Cognitive:  Alert  Insight: Limited  Engagement in Group:  Limited  Modes of Intervention:  Discussion and Education  Additional Comments:  Pt attended group.  Caroll Rancher A 06/21/2015, 9:53 PM

## 2015-06-21 NOTE — Progress Notes (Signed)
DAR NOTE: Pt present with flat affect and depressed mood in the unit. Pt has been isolating himself and has been bed most of the time. Pt denies physical pain, took all his meds as scheduled. As per self inventory, pt had a fair night sleep, fair appetite, low energy, and poor concentration. Pt rate depression at 10, hopeless ness at 10, and anxiety at 10. His goal for today is " to make it through the day."  Pt's safety ensured with 15 minute and environmental checks. Pt currently denies SI/HI and A/V hallucinations. Pt verbally agrees to seek staff if SI/HI or A/VH occurs and to consult with staff before acting on these thoughts. Will continue POC.

## 2015-06-21 NOTE — Progress Notes (Signed)
Recreation Therapy Notes  Date: 02.06.2017 Time: 9:30am Location: [address] Group Room   Group Topic: Stress Management  Goal Area(s) Addresses:  Patient will actively participate in stress management techniques presented during session.   Behavioral Response: Did not attend.   Ermal Brzozowski L Rayman Petrosian, LRT/CTRS        Nohelia Valenza L 06/21/2015 3:26 PM 
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
## 2015-06-21 NOTE — Progress Notes (Signed)
Patient did attend the evening speaker AA meeting.  

## 2015-06-22 MED ORDER — BUSPIRONE HCL 15 MG PO TABS
7.5000 mg | ORAL_TABLET | Freq: Three times a day (TID) | ORAL | Status: DC
  Administered 2015-06-22 – 2015-06-28 (×20): 7.5 mg via ORAL
  Filled 2015-06-22 (×25): qty 1

## 2015-06-22 MED ORDER — ARIPIPRAZOLE 2 MG PO TABS
2.0000 mg | ORAL_TABLET | Freq: Every day | ORAL | Status: DC
  Administered 2015-06-22 – 2015-06-24 (×3): 2 mg via ORAL
  Filled 2015-06-22 (×6): qty 1

## 2015-06-22 MED ORDER — FLUOXETINE HCL 10 MG PO CAPS
30.0000 mg | ORAL_CAPSULE | Freq: Every day | ORAL | Status: DC
  Administered 2015-06-23 – 2015-06-24 (×2): 30 mg via ORAL
  Filled 2015-06-22 (×4): qty 3

## 2015-06-22 NOTE — B[facility] Group Notes (Signed)
B[facility] Group Notes:  (Nursing/MHT/Case Management/Adjunct)  Date:  06/22/2015  Time:  10:58 AM  Type of Therapy:  Psychoeducational Skills  Participation Level:  Did Not Attend  Participation Quality:  N/A  Affect:  N/A  Cognitive:  N/A  Insight:  None  Engagement in Group:  None  Modes of Intervention:  Discussion and Education  Summary of Progress/Problems: Patient was invited to group but did not attend.  Malasha Kleppe E 06/22/2015, 10:58 AM 

## 2015-06-22 NOTE — Progress Notes (Signed)
[facility] MD Progress Note  06/22/2015 6:07 PM Steven [name]  MRN:  [id number] Subjective:  Steven [name] endorses that if he was to get out of here right now he would kill himself. States he still feels very unstable emotionally. States that he cant think of any reason why to continue to go on. States he needs help. He thinks he will be able to handle his addiction once he get "my mind right" Principal Problem: MDD (major depressive disorder), recurrent severe, without psychosis (HCC) Diagnosis:   Patient Active Problem List   Diagnosis Date Noted  . MDD (major depressive disorder), recurrent severe, without psychosis (HCC) [F33.2] 06/18/2015  . Cocaine use disorder, moderate, dependence (HCC) [F14.20]   . Alcohol use disorder, moderate, dependence (HCC) [F10.20] 04/02/2015  . Alcohol abuse [F10.10] 03/08/2015  . Cocaine abuse [F14.10] 03/08/2015  . Substance induced mood disorder (HCC) [F19.94] 03/08/2015  . Suicidal ideation [R45.851]   . COPD (chronic obstructive pulmonary disease) (HCC) [J44.9] 02/09/2015  . Tobacco use disorder [F17.200] 02/09/2015  . Stimulant use disorder (cocaine) [F15.90] 02/09/2015  . Alcohol use disorder, severe, in sustained remission (HCC) [F10.21] 02/09/2015  . Ureteral stone [N20.1] 12/31/2014  . 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 CAD s/p CABG 2012 [facility] [I25.810] 11/28/2011  . Hyperlipidemia LDL goal <100 [E78.5] 03/13/2007  . Essential hypertension [I10] 03/13/2007  . GERD [K21.9] 03/13/2007   Total Time spent with patient: 20 minutes  Past Psychiatric History: see admission H and P  Past Medical History:  Past Medical History  Diagnosis Date  . Coronary artery disease   . Hypertension   . Hypercholesteremia   . Kidney stone   . Tobacco use disorder   . Hx of CABG   . COPD (chronic obstructive pulmonary disease) (HCC)   . Bipolar disorder (manic depression) (HCC)   . Asthma     Past Surgical History  Procedure  Laterality Date  . Coronary artery bypass graft    . Kidney stone surgery    . Appendectomy    . Lower extremity angiogram N/A 08/15/2012    Procedure: LOWER EXTREMITY ANGIOGRAM with possible PTA /stent;  Surgeon: Corky Crafts, MD;  Location: [facility] CATH LAB;  Service: Cardiovascular;  Laterality: N/A;  . Abdominal angiogram  08/15/2012    Procedure: ABDOMINAL ANGIOGRAM;  Surgeon: Corky Crafts, MD;  Location: [facility] CATH LAB;  Service: Cardiovascular;;  . Cardioversion N/A 08/28/2012    Procedure: Pseudo Compression;  Surgeon: Chuck Hint, MD;  Location: [facility] CATH LAB;  Service: Cardiovascular;  Laterality: N/A;  . Cystoscopy with retrograde pyelogram, ureteroscopy and stent placement Right 12/31/2014    Procedure: CYSTOSCOPY  RIGHT URETEROSCOPY WITH STONE RETREIVAL RIGHT RETROGRADE PYELOGRAM;  Surgeon: Malen Gauze, MD;  Location: [facility] ORS;  Service: Urology;  Laterality: Right;   Family History:  Family History  Problem Relation Age of Onset  . Heart disease Father   . Hyperlipidemia Father   . Heart disease Maternal Grandmother    Family Psychiatric  History: see admission H and P Social History:  History  Alcohol Use  . Yes    Comment: last drink 03/07/2015      History  Drug Use  . Yes  . Special: Cocaine, Marijuana    Comment: 10/23//2016    Social History   Social History  . Marital Status: Divorced    Spouse Name: N/A  . Number of Children: N/A  . Years of Education: N/A   Social  History Main Topics  . Smoking status: Current Every Day Smoker -- 1.00 packs/day for 31 years    Types: Cigarettes  . Smokeless tobacco: Former Neurosurgeon    Quit date: 04/27/2013  . Alcohol Use: Yes     Comment: last drink 03/07/2015   . Drug Use: Yes    Special: Cocaine, Marijuana     Comment: 10/23//2016  . Sexual Activity: Yes    Birth Control/ Protection: Condom   Other Topics Concern  . None   Social History Narrative   Additional Social History:    History of  alcohol / drug use?: Yes Longest period of sobriety (when/how long): 18 months within 5 yrs (18 months within 5 yrs) Negative Consequences of Use: Financial Withdrawal Symptoms: Tremors, Nausea / Vomiting, Fever / Chills                    Sleep: Fair  Appetite:  Fair  Current Medications: Current Facility-Administered Medications  Medication Dose Route Frequency Provider Last Rate Last Dose  . acetaminophen (TYLENOL) tablet 650 mg  650 mg Oral Q4H PRN Beau Fanny, [name]   650 mg at 06/20/15 1307  . alum & mag hydroxide-simeth (MAALOX/MYLANTA) 200-200-20 MG/5ML suspension 30 mL  30 mL Oral Q4H PRN Beau Fanny, [name]   30 mL at 06/21/15 0326  . ARIPiprazole (ABILIFY) tablet 2 mg  2 mg Oral Daily Rachael Fee, MD   2 mg at 06/22/15 1136  . atorvastatin (LIPITOR) tablet 20 mg  20 mg Oral q1800 Beau Fanny, [name]   20 mg at 06/22/15 1638  . busPIRone (BUSPAR) tablet 7.5 mg  7.5 mg Oral TID Rachael Fee, MD   7.5 mg at 06/22/15 1638  . chlordiazePOXIDE (LIBRIUM) capsule 25 mg  25 mg Oral QID PRN Sanjuana Kava, NP   25 mg at 06/21/15 1728  . [START ON 06/23/2015] FLUoxetine (PROZAC) capsule 30 mg  30 mg Oral Daily Rachael Fee, MD      . folic acid (FOLVITE) tablet 1 mg  1 mg Oral Daily Beau Fanny, [name]   1 mg at 06/22/15 0820  . hydrOXYzine (ATARAX/VISTARIL) tablet 50 mg  50 mg Oral Q6H PRN Beau Fanny, [name]   50 mg at 06/22/15 1459  . ibuprofen (ADVIL,MOTRIN) tablet 600 mg  600 mg Oral Q8H PRN Beau Fanny, [name]   600 mg at 06/22/15 0825  . magnesium hydroxide (MILK OF MAGNESIA) suspension 30 mL  30 mL Oral Daily PRN Beau Fanny, [name]   30 mL at 06/22/15 1456  . metFORMIN (GLUCOPHAGE) tablet 500 mg  500 mg Oral Q breakfast Beau Fanny, [name]   500 mg at 06/22/15 0820  . methocarbamol (ROBAXIN) tablet 500 mg  500 mg Oral BID Beau Fanny, [name]   500 mg at 06/22/15 1638  . multivitamin with minerals tablet 1 tablet  1 tablet Oral Daily Rachael Fee, MD   1 tablet at  06/22/15 0820  . nicotine (NICODERM CQ - dosed in mg/24 hours) patch 21 mg  21 mg Transdermal Daily Beau Fanny, [name]   21 mg at 06/22/15 1610  . penicillin v potassium (VEETID) tablet 500 mg  500 mg Oral 4 times per day Beau Fanny, [name]   500 mg at 06/21/15 1727  . QUEtiapine (SEROQUEL) tablet 100 mg  100 mg Oral QHS Sanjuana Kava, NP   100 mg at 06/21/15 2118  . QUEtiapine (  SEROQUEL) tablet 25 mg  25 mg Oral TID Sanjuana Kava, NP   25 mg at 06/22/15 1637  . thiamine (VITAMIN B-1) tablet 100 mg  100 mg Oral Daily Rachael Fee, MD   100 mg at 06/22/15 0820  . traZODone (DESYREL) tablet 100 mg  100 mg Oral QHS PRN Beau Fanny, [name]   100 mg at 06/21/15 2118    Lab Results:  Results for orders placed or performed during the hospital encounter of 06/18/15 (from the past 48 hour(s))  Glucose, capillary     Status: Abnormal   Collection Time: 06/20/15  9:10 PM  Result Value Ref Range   Glucose-Capillary 104 (H) 65 - 99 mg/dL   Comment 1 Notify RN    Comment 2 Document in Chart     Physical Findings: AIMS: Facial and Oral Movements Muscles of Facial Expression: None, normal Lips and Perioral Area: None, normal Jaw: None, normal Tongue: None, normal,Extremity Movements Upper (arms, wrists, hands, fingers): None, normal Lower (legs, knees, ankles, toes): None, normal, Trunk Movements Neck, shoulders, hips: None, normal, Overall Severity Severity of abnormal movements (highest score from questions above): None, normal Incapacitation due to abnormal movements: None, normal Patient's awareness of abnormal movements (rate only patient's report): No Awareness, Dental Status Current problems with teeth and/or dentures?: Yes Does patient usually wear dentures?: No  CIWA:  CIWA-Ar Total: 0 COWS:     Musculoskeletal: Strength & Muscle Tone: within normal limits Gait & Station: normal Patient leans: normal  Psychiatric Specialty Exam: Review of Systems  Constitutional: Positive for  malaise/fatigue.  HENT: Negative.   Eyes: Negative.   Respiratory: Negative.   Cardiovascular: Negative.   Gastrointestinal: Negative.   Genitourinary: Negative.   Musculoskeletal: Negative.   Skin: Negative.   Neurological: Negative.   Endo/Heme/Allergies: Negative.   Psychiatric/Behavioral: Positive for depression, suicidal ideas and substance abuse. The patient is nervous/anxious.     Blood pressure 110/73, pulse 83, temperature 97.7 F (36.5 C), temperature source Oral, resp. rate 18, height 5\' 9"  (1.753 m), weight 94.802 kg (209 lb), SpO2 100 %.Body mass index is 30.85 kg/(m^2).  General Appearance: Fairly Groomed  Patent attorney::  Fair  Speech:  Clear and Coherent  Volume:  fluctuates  Mood:  Anxious, Depressed, Dysphoric, Hopeless and Worthless  Affect:  Depressed and Restricted  Thought Process:  Coherent and Goal Directed  Orientation:  Full (Time, Place, and Person)  Thought Content:  symptoms events worries concerns  Suicidal Thoughts:  Yes.  without intent/plan  Homicidal Thoughts:  No  Memory:  Immediate;   Fair Recent;   Fair Remote;   Fair  Judgement:  Fair  Insight:  Present and Shallow  Psychomotor Activity:  Decreased  Concentration:  Fair  Recall:  [employer] of Knowledge:Fair  Language: Fair  Akathisia:  No  Handed:  Right  AIMS (if indicated):     Assets:  Desire for Improvement  ADL's:  Intact  Cognition: WNL  Sleep:  Number of Hours: 6   Treatment Plan Summary: Daily contact with patient to assess and evaluate symptoms and progress in treatment and Medication management Supportive approach/coping skills Cocaine abuse-dependence; continue to work a relapse prevention plan Depression; continue the Prozac increase to 30 mg daily Will augment with Abilify 2 mg daily Will continue the Seroquel as it is providing some help with the anxiety-agitation-sleep Anxiety; will increase the Buspar to 7.5 mg TID Will work with CBT/mindfulness SI able to  contract for safety Explore residential  treatment options Stepen Prins A, MD 06/22/2015, 6:07 PM

## 2015-06-22 NOTE — B[facility] Group Notes (Signed)
[facility] LCSW Group Therapy 06/22/2015  1:15 PM   Type of Therapy: Group Therapy  Participation Level: Did Not Attend. Patient invited to participate but declined.   Nai Borromeo, MSW, LCSW Clinical Social Worker [facility] [phone]   

## 2015-06-22 NOTE — Progress Notes (Signed)
DAR NOTE: Pt present with flat affect and depressed mood in the unit. Pt has been isolating himself and has been bed stating "I don't like being around people, they make me anxious." Did not attend groups. Pt complained of lower back pain, took all his meds as scheduled. As per self inventory, pt had a fair night sleep, fair appetite, low energy, and poor concentration. Pt rate depression at 10, hopeless ness at 10, and anxiety at 10. Pt's safety ensured with 15 minute and environmental checks. Pt currently denies SI/HI and A/V hallucinations. Pt verbally agrees to seek staff if SI/HI or A/VH occurs and to consult with staff before acting on these thoughts. Will continue POC.

## 2015-06-22 NOTE — Progress Notes (Signed)
Recreation Therapy Notes  Animal-Assisted Activity (AAA) Program Checklist/Progress Notes Patient Eligibility Criteria Checklist & Daily Group note for Rec Tx Intervention  Date: 02.07.2017 Time: 2:45pm Location: [address]    AAA/T Program Assumption of Risk Form signed by Patient/ or Parent Legal Guardian yes  Patient is free of allergies or sever asthma yes  Patient reports no fear of animals yes  Patient reports no history of cruelty to animals yes  Patient understands his/her participation is voluntary yes  Behavioral Response: Did not attend.   Marykay Lex Terresa Marlett, LRT/CTRS  Warrick Llera L 06/22/2015 3:08 PM

## 2015-06-23 MED ORDER — PANTOPRAZOLE SODIUM 40 MG PO TBEC
40.0000 mg | DELAYED_RELEASE_TABLET | Freq: Every day | ORAL | Status: DC
  Administered 2015-06-23 – 2015-06-24 (×2): 40 mg via ORAL
  Filled 2015-06-23 (×5): qty 1

## 2015-06-23 NOTE — BHH Group Notes (Signed)
[facility] LCSW Group Therapy 06/23/2015  1:15 PM   Type of Therapy: Group Therapy  Participation Level: Did Not Attend. Patient invited to participate but declined.   Samuella Bruin, MSW, LCSW Clinical Social Worker [facility] [phone]
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
## 2015-06-23 NOTE — Progress Notes (Addendum)
D: Pt has anxious affect and depressed mood.  When asked how his day was, he reports "it has been a day."  Pt reports his goal today was "not to hurt myself."  Pt reports SI without a plan and he verbally contracts for safety, stating "as long as I'm here, I'm all right."  Pt denies HI, denies hallucinations, reports back pain of 7/10.  Pt has been visible in milieu interacting with peers and staff appropriately.  Pt attended evening group.   A: Introduced self to pt.  Met with pt 1:1 and offered support and encouragement.  Medications administered per order.  PRN medication administered for indigestion, pain, anxiety, and sleep.  Medication education provided.   R: Pt is compliant with medications except for Veetid.  He refused it, reporting that it upsets his stomach.  Pt verbally contracts for safety.  Will continue to monitor and assess.

## 2015-06-23 NOTE — Plan of Care (Signed)
Problem: Ineffective individual coping Goal: STG: Patient will remain free from self harm Outcome: Progressing Patient endorses thoughts to self harm however has refrained from doing so.  Problem: Diagnosis: Increased Risk For Suicide Attempt Goal: STG-Patient Will Comply With Medication Regime Outcome: Progressing Patient has been med compliant except for his [facility] which he states upsets his stomach.

## 2015-06-23 NOTE — Progress Notes (Signed)
[facility] MD Progress Note  06/23/2015 5:44 PM STEPEN PRINS  MRN:  [id number] Subjective:  Kaylee states that he might have seen some difference in his mood today. Still endorses feeling down depressed and with some SI but today feels more hopeful. States he really does not want to die. He continues to endorse little support. Has not been able to get a job and his father helps him as much as he can.  Principal Problem: MDD (major depressive disorder), recurrent severe, without psychosis (HCC) Diagnosis:   Patient Active Problem List   Diagnosis Date Noted  . MDD (major depressive disorder), recurrent severe, without psychosis (HCC) [F33.2] 06/18/2015  . Cocaine use disorder, moderate, dependence (HCC) [F14.20]   . Alcohol use disorder, moderate, dependence (HCC) [F10.20] 04/02/2015  . Alcohol abuse [F10.10] 03/08/2015  . Cocaine abuse [F14.10] 03/08/2015  . Substance induced mood disorder (HCC) [F19.94] 03/08/2015  . Suicidal ideation [R45.851]   . COPD (chronic obstructive pulmonary disease) (HCC) [J44.9] 02/09/2015  . Tobacco use disorder [F17.200] 02/09/2015  . Stimulant use disorder (cocaine) [F15.90] 02/09/2015  . Alcohol use disorder, severe, in sustained remission (HCC) [F10.21] 02/09/2015  . Ureteral stone [N20.1] 12/31/2014  . 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 CAD s/p CABG 2012 [facility] [I25.810] 11/28/2011  . Hyperlipidemia LDL goal <100 [E78.5] 03/13/2007  . Essential hypertension [I10] 03/13/2007  . GERD [K21.9] 03/13/2007   Total Time spent with patient: 20 minutes  Past Psychiatric History: see admission H and P  Past Medical History:  Past Medical History  Diagnosis Date  . Coronary artery disease   . Hypertension   . Hypercholesteremia   . Kidney stone   . Tobacco use disorder   . Hx of CABG   . COPD (chronic obstructive pulmonary disease) (HCC)   . Bipolar disorder (manic depression) (HCC)   . Asthma     Past Surgical  History  Procedure Laterality Date  . Coronary artery bypass graft    . Kidney stone surgery    . Appendectomy    . Lower extremity angiogram N/A 08/15/2012    Procedure: LOWER EXTREMITY ANGIOGRAM with possible PTA /stent;  Surgeon: Corky Crafts, MD;  Location: [facility] CATH LAB;  Service: Cardiovascular;  Laterality: N/A;  . Abdominal angiogram  08/15/2012    Procedure: ABDOMINAL ANGIOGRAM;  Surgeon: Corky Crafts, MD;  Location: [facility] CATH LAB;  Service: Cardiovascular;;  . Cardioversion N/A 08/28/2012    Procedure: Pseudo Compression;  Surgeon: Chuck Hint, MD;  Location: [facility] CATH LAB;  Service: Cardiovascular;  Laterality: N/A;  . Cystoscopy with retrograde pyelogram, ureteroscopy and stent placement Right 12/31/2014    Procedure: CYSTOSCOPY  RIGHT URETEROSCOPY WITH STONE RETREIVAL RIGHT RETROGRADE PYELOGRAM;  Surgeon: Malen Gauze, MD;  Location: [facility] ORS;  Service: Urology;  Laterality: Right;   Family History:  Family History  Problem Relation Age of Onset  . Heart disease Father   . Hyperlipidemia Father   . Heart disease Maternal Grandmother    Family Psychiatric  History: see admission H and P Social History:  History  Alcohol Use  . Yes    Comment: last drink 03/07/2015      History  Drug Use  . Yes  . Special: Cocaine, Marijuana    Comment: 10/23//2016    Social History   Social History  . Marital Status: Divorced    Spouse Name: N/A  . Number of Children: N/A  . Years of Education: N/A  Social History Main Topics  . Smoking status: Current Every Day Smoker -- 1.00 packs/day for 31 years    Types: Cigarettes  . Smokeless tobacco: Former Neurosurgeon    Quit date: 04/27/2013  . Alcohol Use: Yes     Comment: last drink 03/07/2015   . Drug Use: Yes    Special: Cocaine, Marijuana     Comment: 10/23//2016  . Sexual Activity: Yes    Birth Control/ Protection: Condom   Other Topics Concern  . None   Social History Narrative   Additional Social  History:    History of alcohol / drug use?: Yes Longest period of sobriety (when/how long): 18 months within 5 yrs (18 months within 5 yrs) Negative Consequences of Use: Financial Withdrawal Symptoms: Tremors, Nausea / Vomiting, Fever / Chills                    Sleep: Fair  Appetite:  Fair  Current Medications: Current Facility-Administered Medications  Medication Dose Route Frequency Provider Last Rate Last Dose  . acetaminophen (TYLENOL) tablet 650 mg  650 mg Oral Q4H PRN Beau Fanny, [name]   650 mg at 06/22/15 2052  . alum & mag hydroxide-simeth (MAALOX/MYLANTA) 200-200-20 MG/5ML suspension 30 mL  30 mL Oral Q4H PRN Beau Fanny, [name]   30 mL at 06/22/15 2231  . ARIPiprazole (ABILIFY) tablet 2 mg  2 mg Oral Daily Rachael Fee, MD   2 mg at 06/23/15 0746  . atorvastatin (LIPITOR) tablet 20 mg  20 mg Oral q1800 Beau Fanny, [name]   20 mg at 06/23/15 1648  . busPIRone (BUSPAR) tablet 7.5 mg  7.5 mg Oral TID Rachael Fee, MD   7.5 mg at 06/23/15 1648  . chlordiazePOXIDE (LIBRIUM) capsule 25 mg  25 mg Oral QID PRN Sanjuana Kava, NP   25 mg at 06/23/15 1116  . FLUoxetine (PROZAC) capsule 30 mg  30 mg Oral Daily Rachael Fee, MD   30 mg at 06/23/15 0746  . folic acid (FOLVITE) tablet 1 mg  1 mg Oral Daily Beau Fanny, [name]   1 mg at 06/23/15 0746  . hydrOXYzine (ATARAX/VISTARIL) tablet 50 mg  50 mg Oral Q6H PRN Beau Fanny, [name]   50 mg at 06/23/15 1517  . ibuprofen (ADVIL,MOTRIN) tablet 600 mg  600 mg Oral Q8H PRN Beau Fanny, [name]   600 mg at 06/23/15 1651  . magnesium hydroxide (MILK OF MAGNESIA) suspension 30 mL  30 mL Oral Daily PRN Beau Fanny, [name]   30 mL at 06/22/15 1456  . metFORMIN (GLUCOPHAGE) tablet 500 mg  500 mg Oral Q breakfast Beau Fanny, [name]   500 mg at 06/23/15 0747  . methocarbamol (ROBAXIN) tablet 500 mg  500 mg Oral BID Beau Fanny, [name]   500 mg at 06/23/15 1648  . multivitamin with minerals tablet 1 tablet  1 tablet Oral Daily Rachael Fee, MD   1 tablet at 06/23/15 0746  . nicotine (NICODERM CQ - dosed in mg/24 hours) patch 21 mg  21 mg Transdermal Daily Beau Fanny, [name]   21 mg at 06/23/15 0747  . pantoprazole (PROTONIX) EC tablet 40 mg  40 mg Oral Daily Rachael Fee, MD   40 mg at 06/23/15 1517  . penicillin v potassium (VEETID) tablet 500 mg  500 mg Oral 4 times per day Beau Fanny, [name]   500 mg at 06/21/15 1727  .  QUEtiapine (SEROQUEL) tablet 100 mg  100 mg Oral QHS Sanjuana Kava, NP   100 mg at 06/22/15 2043  . QUEtiapine (SEROQUEL) tablet 25 mg  25 mg Oral TID Sanjuana Kava, NP   25 mg at 06/23/15 1648  . thiamine (VITAMIN B-1) tablet 100 mg  100 mg Oral Daily Rachael Fee, MD   100 mg at 06/23/15 0746  . traZODone (DESYREL) tablet 100 mg  100 mg Oral QHS PRN Beau Fanny, [name]   100 mg at 06/22/15 2042    Lab Results: No results found for this or any previous visit (from the past 48 hour(s)).  Physical Findings: AIMS: Facial and Oral Movements Muscles of Facial Expression: None, normal Lips and Perioral Area: None, normal Jaw: None, normal Tongue: None, normal,Extremity Movements Upper (arms, wrists, hands, fingers): None, normal Lower (legs, knees, ankles, toes): None, normal, Trunk Movements Neck, shoulders, hips: None, normal, Overall Severity Severity of abnormal movements (highest score from questions above): None, normal Incapacitation due to abnormal movements: None, normal Patient's awareness of abnormal movements (rate only patient's report): No Awareness, Dental Status Current problems with teeth and/or dentures?: Yes Does patient usually wear dentures?: No  CIWA:  CIWA-Ar Total: 2 COWS:     Musculoskeletal: Strength & Muscle Tone: within normal limits Gait & Station: normal Patient leans: normal  Psychiatric Specialty Exam: Review of Systems  Constitutional: Negative.   HENT: Negative.   Eyes: Negative.   Respiratory: Negative.   Cardiovascular: Negative.   Gastrointestinal:  Negative.   Genitourinary: Negative.   Musculoskeletal: Negative.   Skin: Negative.   Neurological: Negative.   Endo/Heme/Allergies: Negative.   Psychiatric/Behavioral: Positive for depression and substance abuse. The patient is nervous/anxious.     Blood pressure 137/92, pulse 90, temperature 98.2 F (36.8 C), temperature source Oral, resp. rate 14, height  (1.753 m), weight 94.802 kg (209 lb), SpO2 100 %.Body mass index is 30.85 kg/(m^2).  General Appearance: Fairly Groomed  Patent attorney::  Minimal  Speech:  Clear and Coherent and Slow  Volume:  Decreased  Mood:  Anxious and Depressed  Affect:  Depressed and Restricted  Thought Process:  Coherent and Goal Directed  Orientation:  Full (Time, Place, and Person)  Thought Content:  symptoms events worries concerns ruminations  Suicidal Thoughts:  Yes.  without intent/plan  Homicidal Thoughts:  No  Memory:  Immediate;   Fair Recent;   Fair Remote;   Fair  Judgement:  Fair  Insight:  Present and Shallow  Psychomotor Activity:  Decreased  Concentration:  Fair  Recall:  [employer] of Knowledge:Fair  Language: Fair  Akathisia:  No  Handed:  Right  AIMS (if indicated):     Assets:  Desire for Improvement  ADL's:  Intact  Cognition: WNL  Sleep:  Number of Hours: 5.75   Treatment Plan Summary: Daily contact with patient to assess and evaluate symptoms and progress in treatment and Medication management Supportive approach/coping skills Alcohol/cocaine dependence; continue the Librium detox protocol/work a relapse prevention plan Depression; continue the Prozac 30 mg with Abilify augmentation with plans to optimize dose response Mood instability; continue the Seroquel  Continue to monitor the SI ( can still contract for safety) Work with CBT/mindfulness Identify residential treatment options Shaka Zech A, MD 06/23/2015, 5:44 PM

## 2015-06-23 NOTE — Progress Notes (Addendum)
Patient up and visible in the milieu. Affect remains flat, depressed. Rates depression and hopelessness both at a 10/10, anxiety at a 9/10. Reports his goal is to "not harm myself" as he states upon waking he was thinking of ways to get out of [facility] so that he could complete suicide. States he will not harm himself here and should the thoughts develop into urges, he will reach out to staff. Reports pain of an 8/10 however refuses available prn's. "You all aren't giving me what I need for pain. I guess cause I'm detoxing." Reminded patient robaxin included in his AM meds which he took without difficulty. Patient did refuse antibiotic for his tooth infection indicated it makes his stomach upset. States the tooth pain has resolved. Med education provided regarding antibx however patient declined to take. Emotional support and reassurance provided. Self inventory reviewed. Denies HI/AVH and remains safe on level III obs. Will continue to monitor closely. Lawrence Marseilles

## 2015-06-23 NOTE — Progress Notes (Signed)
Recreation Therapy Notes  Date: 02.08.2017  Time: 9:30am Location: [address] Group Room   Group Topic: Stress Management  Goal Area(s) Addresses:  Patient will actively participate in stress management techniques presented during session.   Behavioral Response: Did not attend.   Tobie Perdue L Yony Roulston, LRT/CTRS        Abraham Entwistle L 06/23/2015 2:43 PM 

## 2015-06-23 NOTE — Progress Notes (Signed)
  [facility] Adult Case Management Discharge Plan :  Will you be returning to the same living situation after discharge:  No. Patient plans to go to [facility] At discharge, do you have transportation home?: Yes,  patient will be provided with taxi voucher Do you have the ability to pay for your medications: Yes,  patient will be provided with prescriptions and samples at discharge  Release of information consent forms completed and in the chart;  Patient's signature needed at discharge.  Patient to Follow up at: Follow-up Information    Follow up with [facility] On 06/24/2015.   Why:  Admission screening on Thursday Feb. 9th at 8am. Please bring your [place] ID. Call if you need to reschedule.    Contact information:   [address] [place], [place] [address] [phone]      Next level of care provider has access to [facility] Link:no  Safety Planning and Suicide Prevention discussed: Yes,  with patient   Have you used any form of tobacco in the last 30 days? (Cigarettes, Smokeless Tobacco, Cigars, and/or Pipes): Yes  Has patient been referred to the Quitline?: Yes, faxed on 06/23/15  Patient has been referred for addiction treatment: Yes  Maritssa Haughton, [place] 06/23/2015, 9:29 AM

## 2015-06-24 MED ORDER — ARIPIPRAZOLE 5 MG PO TABS
5.0000 mg | ORAL_TABLET | Freq: Every day | ORAL | Status: DC
  Administered 2015-06-25 – 2015-06-28 (×4): 5 mg via ORAL
  Filled 2015-06-24 (×6): qty 1

## 2015-06-24 MED ORDER — FLUOXETINE HCL 20 MG PO CAPS
40.0000 mg | ORAL_CAPSULE | Freq: Every day | ORAL | Status: DC
  Administered 2015-06-25 – 2015-06-28 (×4): 40 mg via ORAL
  Filled 2015-06-24 (×6): qty 2

## 2015-06-24 MED ORDER — PANTOPRAZOLE SODIUM 40 MG PO TBEC
40.0000 mg | DELAYED_RELEASE_TABLET | Freq: Two times a day (BID) | ORAL | Status: DC
  Administered 2015-06-24 – 2015-06-28 (×10): 40 mg via ORAL
  Filled 2015-06-24 (×12): qty 1

## 2015-06-24 NOTE — [facility] Group Notes (Signed)
[facility] LCSW Group Therapy 06/24/2015  1:15 PM   Type of Therapy: Group Therapy  Participation Level: Did Not Attend. Patient invited to participate but declined.   Anisa Leanos, MSW, LCSW Clinical Social Worker [facility] [phone]   

## 2015-06-24 NOTE — Progress Notes (Signed)
D-  Patient has been on the hall participating in groups and unit activities.  Patient has had many somatic complaints that were resolved.  Patient has passive suicidal thoughts with no plan.    A- Assess patient for safety, offer medications as prescribed, engage writer in 1:1 staff talks.   R-  Patient able to contract for safety, continue to monitor as planned.

## 2015-06-24 NOTE — Progress Notes (Signed)
[facility] MD Progress Note  06/24/2015 4:56 PM Steven [name]  MRN:  [id number] Subjective:  Steven [name] is still endorsing depression but states that he has seen some benefit after being started on the Abilify. States that he is still feeling mentally unstable, states that he cant even focus on his substance abuse until he gets his "mind right"  Principal Problem: MDD (major depressive disorder), recurrent severe, without psychosis (HCC) Diagnosis:   Patient Active Problem List   Diagnosis Date Noted  . MDD (major depressive disorder), recurrent severe, without psychosis (HCC) [F33.2] 06/18/2015  . Cocaine use disorder, moderate, dependence (HCC) [F14.20]   . Alcohol use disorder, moderate, dependence (HCC) [F10.20] 04/02/2015  . Alcohol abuse [F10.10] 03/08/2015  . Cocaine abuse [F14.10] 03/08/2015  . Substance induced mood disorder (HCC) [F19.94] 03/08/2015  . Suicidal ideation [R45.851]   . COPD (chronic obstructive pulmonary disease) (HCC) [J44.9] 02/09/2015  . Tobacco use disorder [F17.200] 02/09/2015  . Stimulant use disorder (cocaine) [F15.90] 02/09/2015  . Alcohol use disorder, severe, in sustained remission (HCC) [F10.21] 02/09/2015  . Ureteral stone [N20.1] 12/31/2014  . 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 CAD s/p CABG 2012 [facility] [I25.810] 11/28/2011  . Hyperlipidemia LDL goal <100 [E78.5] 03/13/2007  . Essential hypertension [I10] 03/13/2007  . GERD [K21.9] 03/13/2007   Total Time spent with patient: 20 minutes  Past Psychiatric History: see admission H and P  Past Medical History:  Past Medical History  Diagnosis Date  . Coronary artery disease   . Hypertension   . Hypercholesteremia   . Kidney stone   . Tobacco use disorder   . Hx of CABG   . COPD (chronic obstructive pulmonary disease) (HCC)   . Bipolar disorder (manic depression) (HCC)   . Asthma     Past Surgical History  Procedure Laterality Date  . Coronary artery bypass  graft    . Kidney stone surgery    . Appendectomy    . Lower extremity angiogram N/A 08/15/2012    Procedure: LOWER EXTREMITY ANGIOGRAM with possible PTA /stent;  Surgeon: Corky Crafts, MD;  Location: [facility] CATH LAB;  Service: Cardiovascular;  Laterality: N/A;  . Abdominal angiogram  08/15/2012    Procedure: ABDOMINAL ANGIOGRAM;  Surgeon: Corky Crafts, MD;  Location: [facility] CATH LAB;  Service: Cardiovascular;;  . Cardioversion N/A 08/28/2012    Procedure: Pseudo Compression;  Surgeon: Chuck Hint, MD;  Location: [facility] CATH LAB;  Service: Cardiovascular;  Laterality: N/A;  . Cystoscopy with retrograde pyelogram, ureteroscopy and stent placement Right 12/31/2014    Procedure: CYSTOSCOPY  RIGHT URETEROSCOPY WITH STONE RETREIVAL RIGHT RETROGRADE PYELOGRAM;  Surgeon: Malen Gauze, MD;  Location: [facility] ORS;  Service: Urology;  Laterality: Right;   Family History:  Family History  Problem Relation Age of Onset  . Heart disease Father   . Hyperlipidemia Father   . Heart disease Maternal Grandmother    Family Psychiatric  History: see admission H and P Social History:  History  Alcohol Use  . Yes    Comment: last drink 03/07/2015      History  Drug Use  . Yes  . Special: Cocaine, Marijuana    Comment: 10/23//2016    Social History   Social History  . Marital Status: Divorced    Spouse Name: N/A  . Number of Children: N/A  . Years of Education: N/A   Social History Main Topics  . Smoking status: Current Every Day Smoker -- 1.00 packs/day  for 31 years    Types: Cigarettes  . Smokeless tobacco: Former Neurosurgeon    Quit date: 04/27/2013  . Alcohol Use: Yes     Comment: last drink 03/07/2015   . Drug Use: Yes    Special: Cocaine, Marijuana     Comment: 10/23//2016  . Sexual Activity: Yes    Birth Control/ Protection: Condom   Other Topics Concern  . None   Social History Narrative   Additional Social History:    History of alcohol / drug use?: Yes Longest period  of sobriety (when/how long): 18 months within 5 yrs (18 months within 5 yrs) Negative Consequences of Use: Financial Withdrawal Symptoms: Tremors, Nausea / Vomiting, Fever / Chills                    Sleep: Fair  Appetite:  Fair  Current Medications: Current Facility-Administered Medications  Medication Dose Route Frequency Provider Last Rate Last Dose  . acetaminophen (TYLENOL) tablet 650 mg  650 mg Oral Q4H PRN Beau Fanny, [name]   650 mg at 06/22/15 2052  . alum & mag hydroxide-simeth (MAALOX/MYLANTA) 200-200-20 MG/5ML suspension 30 mL  30 mL Oral Q4H PRN Beau Fanny, [name]   30 mL at 06/23/15 2330  . [START ON 06/25/2015] ARIPiprazole (ABILIFY) tablet 5 mg  5 mg Oral Daily Rachael Fee, MD      . atorvastatin (LIPITOR) tablet 20 mg  20 mg Oral q1800 Beau Fanny, [name]   20 mg at 06/23/15 1648  . busPIRone (BUSPAR) tablet 7.5 mg  7.5 mg Oral TID Rachael Fee, MD   7.5 mg at 06/24/15 1304  . chlordiazePOXIDE (LIBRIUM) capsule 25 mg  25 mg Oral QID PRN Sanjuana Kava, NP   25 mg at 06/24/15 1307  . [START ON 06/25/2015] FLUoxetine (PROZAC) capsule 40 mg  40 mg Oral Daily Rachael Fee, MD      . folic acid (FOLVITE) tablet 1 mg  1 mg Oral Daily Beau Fanny, [name]   1 mg at 06/24/15 1914  . hydrOXYzine (ATARAX/VISTARIL) tablet 50 mg  50 mg Oral Q6H PRN Beau Fanny, [name]   50 mg at 06/23/15 2330  . ibuprofen (ADVIL,MOTRIN) tablet 600 mg  600 mg Oral Q8H PRN Beau Fanny, [name]   600 mg at 06/23/15 1651  . magnesium hydroxide (MILK OF MAGNESIA) suspension 30 mL  30 mL Oral Daily PRN Beau Fanny, [name]   30 mL at 06/22/15 1456  . metFORMIN (GLUCOPHAGE) tablet 500 mg  500 mg Oral Q breakfast Beau Fanny, [name]   500 mg at 06/24/15 7829  . methocarbamol (ROBAXIN) tablet 500 mg  500 mg Oral BID Beau Fanny, [name]   500 mg at 06/24/15 5621  . multivitamin with minerals tablet 1 tablet  1 tablet Oral Daily Rachael Fee, MD   1 tablet at 06/24/15 [phone]  . nicotine (NICODERM CQ -  dosed in mg/24 hours) patch 21 mg  21 mg Transdermal Daily Beau Fanny, [name]   21 mg at 06/24/15 5784  . pantoprazole (PROTONIX) EC tablet 40 mg  40 mg Oral BID Rachael Fee, MD      . penicillin v potassium (VEETID) tablet 500 mg  500 mg Oral 4 times per day Beau Fanny, [name]   500 mg at 06/24/15 1303  . QUEtiapine (SEROQUEL) tablet 100 mg  100 mg Oral QHS Sanjuana Kava, NP   100  mg at 06/23/15 2118  . QUEtiapine (SEROQUEL) tablet 25 mg  25 mg Oral TID Sanjuana Kava, NP   25 mg at 06/24/15 1303  . thiamine (VITAMIN B-1) tablet 100 mg  100 mg Oral Daily Rachael Fee, MD   100 mg at 06/24/15 1308  . traZODone (DESYREL) tablet 100 mg  100 mg Oral QHS PRN Beau Fanny, [name]   100 mg at 06/23/15 2119    Lab Results: No results found for this or any previous visit (from the past 48 hour(s)).  Physical Findings: AIMS: Facial and Oral Movements Muscles of Facial Expression: None, normal Lips and Perioral Area: None, normal Jaw: None, normal Tongue: None, normal,Extremity Movements Upper (arms, wrists, hands, fingers): None, normal Lower (legs, knees, ankles, toes): None, normal, Trunk Movements Neck, shoulders, hips: None, normal, Overall Severity Severity of abnormal movements (highest score from questions above): None, normal Incapacitation due to abnormal movements: None, normal Patient's awareness of abnormal movements (rate only patient's report): No Awareness, Dental Status Current problems with teeth and/or dentures?: Yes Does patient usually wear dentures?: No  CIWA:  CIWA-Ar Total: 2 COWS:     Musculoskeletal: Strength & Muscle Tone: within normal limits Gait & Station: normal Patient leans: normal  Psychiatric Specialty Exam: Review of Systems  Constitutional: Negative.   HENT: Negative.   Eyes: Negative.   Respiratory: Negative.   Cardiovascular: Negative.   Gastrointestinal: Negative.   Genitourinary: Negative.   Musculoskeletal: Negative.   Skin: Negative.    Neurological: Negative.   Endo/Heme/Allergies: Negative.   Psychiatric/Behavioral: Positive for depression, suicidal ideas and substance abuse. The patient is nervous/anxious.     Blood pressure 112/77, pulse 88, temperature 97.6 F (36.4 C), temperature source Oral, resp. rate 18, height 5\' 9"  (1.753 m), weight 94.802 kg (209 lb), SpO2 100 %.Body mass index is 30.85 kg/(m^2).  General Appearance: Fairly Groomed  Patent attorney::  Fair  Speech:  Clear and Coherent  Volume:  Decreased  Mood:  Anxious and Depressed  Affect:  anxious worried  Thought Process:  Coherent and Goal Directed  Orientation:  Full (Time, Place, and Person)  Thought Content:  symptoms events worries concerns  Suicidal Thoughts:  Yes.  without intent/plan  Homicidal Thoughts:  No  Memory:  Immediate;   Fair Recent;   Fair Remote;   Fair  Judgement:  Fair  Insight:  Present  Psychomotor Activity:  Decreased  Concentration:  Fair  Recall:  [employer] of Knowledge:Fair  Language: Fair  Akathisia:  No  Handed:  Right  AIMS (if indicated):     Assets:  Desire for Improvement  ADL's:  Intact  Cognition: WNL  Sleep:  Number of Hours: 5.75   Treatment Plan Summary: Daily contact with patient to assess and evaluate symptoms and progress in treatment and Medication management Supportive approach/coping skills Substance dependence; continue to work a relapse prevention plan Depression; increase the Prozac to 40 mg daily and increase the Abilify to 5 mg daily Note; will continue the Seroquel as it helps his anxiety-agitation-mood instability Work with CBT/mindfulness Explore residential treatment options Vernessa Likes A, MD 06/24/2015, 4:56 PM

## 2015-06-24 NOTE — [facility] Group Notes (Signed)
[facility] LCSW Group Therapy  06/24/2015 4:17 PM  Type of Therapy:  Group Therapy  Participation Level:  Active  Participation Quality:  Attentive  Affect:  Appropriate  Cognitive:  Alert and Oriented  Insight:  Improving  Engagement in Therapy:  Improving  Modes of Intervention:  Confrontation, Discussion, Education, Exploration, Problem-solving, Rapport Building, Socialization and Support  Summary of Progress/Problems: Today's Topic: Overcoming Obstacles. Patients identified one short term goal and potential obstacles in reaching this goal. Patients processed barriers involved in overcoming these obstacles. Patients identified steps necessary for overcoming these obstacles and explored motivation (internal and external) for facing these difficulties head on. Steven [name] shared that his biggest obstacle involves finding the "right support" outside of the hospital setting. "I plan to join NA and reconnect with my sponsor." Pt reports that his parents don't understand addiction to drugs and stigmatize him at times.    Smart, [name] LCSW 06/24/2015, 4:17 PM

## 2015-06-25 NOTE — Progress Notes (Signed)
Pt did attend the evening wrap up group. Pt was attentive, supportive, and engaged. Pt flat and reports struggling with happiness without alcohol.

## 2015-06-25 NOTE — Progress Notes (Signed)
Pt did not attend AA group this evening.  

## 2015-06-25 NOTE — [facility] Group Notes (Signed)
[facility] LCSW Group Therapy 06/25/2015 1:15 PM Type of Therapy: Group Therapy Participation Level: Active  Participation Quality: Attentive, Sharing and Supportive  Affect: Appropriate  Cognitive: Alert and Oriented  Insight: Developing/Improving and Engaged  Engagement in Therapy: Developing/Improving and Engaged  Modes of Intervention: Clarification, Confrontation, Discussion, Education, Exploration, Limit-setting, Orientation, Problem-solving, Rapport Building, Dance movement psychotherapist, Socialization and Support  Summary of Progress/Problems: The topic for today was feelings about relapse. Pt discussed what relapse prevention is to them and identified triggers that they are on the path to relapse. Pt processed their feeling towards relapse and was able to relate to peers. Pt discussed coping skills that can be used for relapse prevention. Patient discussed concepts that he has learned in AA that have been helpful in his recovery and how his recovery has influenced his relationships with his family.    Samuella Bruin, MSW, LCSW Clinical Social Worker [facility] [phone]

## 2015-06-25 NOTE — Progress Notes (Signed)
D: Patient alert and oriented x 4. Patient denies pain/SI/HI/AVH. Patient reported he was having real bad anxiety and requested Librium and Vistaril. PRN was given at 2005.  Patient reported PRN medications were effective.  A: Staff to monitor Q 15 mins for safety. Encouragement and support offered. Scheduled medications administered per orders. R: Patient remains safe on the unit. Patient attended group tonight. Patient visible on hte unit and interacting with peers. Patient taking administered medications.

## 2015-06-25 NOTE — Progress Notes (Signed)
[facility] MD Progress Note  06/25/2015 2:57 PM Steven [name]  MRN:  [id number] Subjective:  Steven [name] continues to deal with his depression. Does admits that he has felt "a little better" what makes him feel more hopeful. Feels that the increase in Prozac with the Abilify has made a difference. Still endorses the SI but admits he is more hopeful Principal Problem: MDD (major depressive disorder), recurrent severe, without psychosis (HCC) Diagnosis:   Patient Active Problem List   Diagnosis Date Noted  . MDD (major depressive disorder), recurrent severe, without psychosis (HCC) [F33.2] 06/18/2015  . Cocaine use disorder, moderate, dependence (HCC) [F14.20]   . Alcohol use disorder, moderate, dependence (HCC) [F10.20] 04/02/2015  . Alcohol abuse [F10.10] 03/08/2015  . Cocaine abuse [F14.10] 03/08/2015  . Substance induced mood disorder (HCC) [F19.94] 03/08/2015  . Suicidal ideation [R45.851]   . COPD (chronic obstructive pulmonary disease) (HCC) [J44.9] 02/09/2015  . Tobacco use disorder [F17.200] 02/09/2015  . Stimulant use disorder (cocaine) [F15.90] 02/09/2015  . Alcohol use disorder, severe, in sustained remission (HCC) [F10.21] 02/09/2015  . Ureteral stone [N20.1] 12/31/2014  . 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 CAD s/p CABG 2012 [facility] [I25.810] 11/28/2011  . Hyperlipidemia LDL goal <100 [E78.5] 03/13/2007  . Essential hypertension [I10] 03/13/2007  . GERD [K21.9] 03/13/2007   Total Time spent with patient: 20 minutes  Past Psychiatric History: see admission H and P  Past Medical History:  Past Medical History  Diagnosis Date  . Coronary artery disease   . Hypertension   . Hypercholesteremia   . Kidney stone   . Tobacco use disorder   . Hx of CABG   . COPD (chronic obstructive pulmonary disease) (HCC)   . Bipolar disorder (manic depression) (HCC)   . Asthma     Past Surgical History  Procedure Laterality Date  . Coronary artery bypass  graft    . Kidney stone surgery    . Appendectomy    . Lower extremity angiogram N/A 08/15/2012    Procedure: LOWER EXTREMITY ANGIOGRAM with possible PTA /stent;  Surgeon: Corky Crafts, MD;  Location: [facility] CATH LAB;  Service: Cardiovascular;  Laterality: N/A;  . Abdominal angiogram  08/15/2012    Procedure: ABDOMINAL ANGIOGRAM;  Surgeon: Corky Crafts, MD;  Location: [facility] CATH LAB;  Service: Cardiovascular;;  . Cardioversion N/A 08/28/2012    Procedure: Pseudo Compression;  Surgeon: Chuck Hint, MD;  Location: [facility] CATH LAB;  Service: Cardiovascular;  Laterality: N/A;  . Cystoscopy with retrograde pyelogram, ureteroscopy and stent placement Right 12/31/2014    Procedure: CYSTOSCOPY  RIGHT URETEROSCOPY WITH STONE RETREIVAL RIGHT RETROGRADE PYELOGRAM;  Surgeon: Malen Gauze, MD;  Location: [facility] ORS;  Service: Urology;  Laterality: Right;   Family History:  Family History  Problem Relation Age of Onset  . Heart disease Father   . Hyperlipidemia Father   . Heart disease Maternal Grandmother    Family Psychiatric  History: see admission H and P Social History:  History  Alcohol Use  . Yes    Comment: last drink 03/07/2015      History  Drug Use  . Yes  . Special: Cocaine, Marijuana    Comment: 10/23//2016    Social History   Social History  . Marital Status: Divorced    Spouse Name: N/A  . Number of Children: N/A  . Years of Education: N/A   Social History Main Topics  . Smoking status: Current Every Day Smoker -- 1.00  packs/day for 31 years    Types: Cigarettes  . Smokeless tobacco: Former Neurosurgeon    Quit date: 04/27/2013  . Alcohol Use: Yes     Comment: last drink 03/07/2015   . Drug Use: Yes    Special: Cocaine, Marijuana     Comment: 10/23//2016  . Sexual Activity: Yes    Birth Control/ Protection: Condom   Other Topics Concern  . None   Social History Narrative   Additional Social History:    History of alcohol / drug use?: Yes Longest period  of sobriety (when/how long): 18 months within 5 yrs (18 months within 5 yrs) Negative Consequences of Use: Financial Withdrawal Symptoms: Tremors, Nausea / Vomiting, Fever / Chills                    Sleep: Fair  Appetite:  Fair  Current Medications: Current Facility-Administered Medications  Medication Dose Route Frequency Provider Last Rate Last Dose  . acetaminophen (TYLENOL) tablet 650 mg  650 mg Oral Q4H PRN Beau Fanny, [name]   650 mg at 06/22/15 2052  . alum & mag hydroxide-simeth (MAALOX/MYLANTA) 200-200-20 MG/5ML suspension 30 mL  30 mL Oral Q4H PRN Beau Fanny, [name]   30 mL at 06/23/15 2330  . ARIPiprazole (ABILIFY) tablet 5 mg  5 mg Oral Daily Rachael Fee, MD   5 mg at 06/25/15 0741  . atorvastatin (LIPITOR) tablet 20 mg  20 mg Oral q1800 Beau Fanny, [name]   20 mg at 06/24/15 1705  . busPIRone (BUSPAR) tablet 7.5 mg  7.5 mg Oral TID Rachael Fee, MD   7.5 mg at 06/25/15 1124  . chlordiazePOXIDE (LIBRIUM) capsule 25 mg  25 mg Oral QID PRN Sanjuana Kava, NP   25 mg at 06/25/15 1428  . FLUoxetine (PROZAC) capsule 40 mg  40 mg Oral Daily Rachael Fee, MD   40 mg at 06/25/15 0741  . folic acid (FOLVITE) tablet 1 mg  1 mg Oral Daily Beau Fanny, [name]   1 mg at 06/25/15 0741  . hydrOXYzine (ATARAX/VISTARIL) tablet 50 mg  50 mg Oral Q6H PRN Beau Fanny, [name]   50 mg at 06/25/15 1428  . ibuprofen (ADVIL,MOTRIN) tablet 600 mg  600 mg Oral Q8H PRN Beau Fanny, [name]   600 mg at 06/25/15 1126  . metFORMIN (GLUCOPHAGE) tablet 500 mg  500 mg Oral Q breakfast Beau Fanny, [name]   500 mg at 06/25/15 0741  . methocarbamol (ROBAXIN) tablet 500 mg  500 mg Oral BID Beau Fanny, [name]   500 mg at 06/25/15 0741  . multivitamin with minerals tablet 1 tablet  1 tablet Oral Daily Rachael Fee, MD   1 tablet at 06/25/15 0741  . nicotine (NICODERM CQ - dosed in mg/24 hours) patch 21 mg  21 mg Transdermal Daily Beau Fanny, [name]   21 mg at 06/25/15 0743  . pantoprazole (PROTONIX)  EC tablet 40 mg  40 mg Oral BID Rachael Fee, MD   40 mg at 06/25/15 1610  . penicillin v potassium (VEETID) tablet 500 mg  500 mg Oral 4 times per day Beau Fanny, [name]   500 mg at 06/25/15 1124  . QUEtiapine (SEROQUEL) tablet 100 mg  100 mg Oral QHS Sanjuana Kava, NP   100 mg at 06/24/15 2136  . QUEtiapine (SEROQUEL) tablet 25 mg  25 mg Oral TID Sanjuana Kava, NP   [phone]  mg at 06/25/15 1124  . thiamine (VITAMIN B-1) tablet 100 mg  100 mg Oral Daily Rachael Fee, MD   100 mg at 06/25/15 0741  . traZODone (DESYREL) tablet 100 mg  100 mg Oral QHS PRN Beau Fanny, [name]   100 mg at 06/24/15 2137    Lab Results: No results found for this or any previous visit (from the past 48 hour(s)).  Physical Findings: AIMS: Facial and Oral Movements Muscles of Facial Expression: None, normal Lips and Perioral Area: None, normal Jaw: None, normal Tongue: None, normal,Extremity Movements Upper (arms, wrists, hands, fingers): None, normal Lower (legs, knees, ankles, toes): None, normal, Trunk Movements Neck, shoulders, hips: None, normal, Overall Severity Severity of abnormal movements (highest score from questions above): None, normal Incapacitation due to abnormal movements: None, normal Patient's awareness of abnormal movements (rate only patient's report): No Awareness, Dental Status Current problems with teeth and/or dentures?: Yes Does patient usually wear dentures?: No  CIWA:  CIWA-Ar Total: 2 COWS:     Musculoskeletal: Strength & Muscle Tone: within normal limits Gait & Station: normal Patient leans: normal  Psychiatric Specialty Exam: Review of Systems  Constitutional: Negative.   HENT: Negative.   Eyes: Negative.   Respiratory: Negative.   Cardiovascular: Negative.   Gastrointestinal: Negative.   Genitourinary: Negative.   Musculoskeletal: Negative.   Skin: Negative.   Neurological: Negative.   Endo/Heme/Allergies: Negative.   Psychiatric/Behavioral: Positive for depression,  suicidal ideas and substance abuse. The patient is nervous/anxious.     Blood pressure 101/53, pulse 90, temperature 97.5 F (36.4 C), temperature source Oral, resp. rate 20, height  (1.753 m), weight 94.802 kg (209 lb), SpO2 100 %.Body mass index is 30.85 kg/(m^2).  General Appearance: Fairly Groomed  Patent attorney::  Fair  Speech:  Clear and Coherent  Volume:  Normal  Mood:  Anxious and Depressed  Affect:  anxious worried   Thought Process:  Circumstantial  Orientation:  Full (Time, Place, and Person)  Thought Content:  symptoms events worries concerns  Suicidal Thoughts:  Yes.  without intent/plan  Homicidal Thoughts:  No  Memory:  Immediate;   Fair Recent;   Fair Remote;   Fair  Judgement:  Fair  Insight:  Present and Shallow  Psychomotor Activity:  Normal  Concentration:  Fair  Recall:  [employer] of Knowledge:Fair  Language: Fair  Akathisia:  No  Handed:  Right  AIMS (if indicated):     Assets:  Desire for Improvement  ADL's:  Intact  Cognition: WNL  Sleep:  Number of Hours: 5.75   Treatment Plan Summary: Daily contact with patient to assess and evaluate symptoms and progress in treatment and Medication management  Supportive approach/coping skills Cocaine abuse work a relapse prevention plan Alcohol dependence; complete the detox protocol/work a relapse prevention plan Depression; continue the Prozac 40 mg with Abilify 5 mg augmentation Mood instability ruminative thinking; continue the Seroquel  Anxiety; continue the Buspar 7.5 mg BID Work with CBT/mindfulness  Breylon Sherrow A, MD 06/25/2015, 2:57 PM

## 2015-06-25 NOTE — Progress Notes (Addendum)
Pt requested to have a different nurse. Pt is very needy and at times attention seeking. He stood at the nurses station this am demanding his mylanta. Pt was instructed his nurse would be with him shortly and then he asked another co-worker to get his nurse. The writer went over all there pts. meds with him prior to administration. Pt did not have any questions. He stated when he leaves he will go live with his uncle Dannielle Huh., Pt does appear limited at times. He stated he had one loose stool and blamed the earlier nurse for this. Pt does contract for safety and denies Si and HI. Pt rates his depression a 10/10, anxiety a 10/10 and hopelessness a 10/10. He does have passive Si but stated he would not hurt himself. He stated some days or worse than others. Pt would like to contact his family. Pt stated he feels nervous after attending group where there was discussion about drugs. (2:45pm)

## 2015-06-25 NOTE — [facility] Group Notes (Signed)
[facility] LCSW Aftercare Discharge Planning Group Note  06/25/2015  8:45 AM  Participation Quality: Did Not Attend. Patient invited to participate but declined.  Steven [name], MSW, LCSW Clinical Social Worker [facility] [phone]   

## 2015-06-26 ENCOUNTER — Encounter (HOSPITAL_COMMUNITY): Payer: Self-pay | Admitting: Registered Nurse

## 2015-06-26 DIAGNOSIS — F1410 Cocaine abuse, uncomplicated: Secondary | ICD-10-CM

## 2015-06-26 DIAGNOSIS — F319 Bipolar disorder, unspecified: Secondary | ICD-10-CM

## 2015-06-26 NOTE — [facility] Group Notes (Signed)
[facility] Group Notes: (Clinical Social Work)   06/26/2015      Type of Therapy:  Group Therapy   Participation Level:  Did Not Attend despite MHT prompting   Steven Diemer Grossman-Orr, LCSW 06/26/2015, 12:43 PM     

## 2015-06-26 NOTE — Progress Notes (Signed)
Patient did attend wrap-up group and rated his day a 3. Goal is to get ready for discharge.

## 2015-06-26 NOTE — Progress Notes (Signed)
Nursing Note : Nursing Progress Note: 7-7p  D- Pt C/o having heartburn yesterday and not getting relieve. Refuses any suggestions from staff. Refuses to discuss discharge plans," that makes me anxious". Pt is able to contract for safety. Continues to have difficulty staying asleep, related to reflux. Goal for today is to work on depression.  A - Observed pt interacting  in the milieu.Pt didn't attend group stated he got up late.Support and encouragement offered, safety maintained with q 15 . Pt wanted to be transferred to medical to have EKG reevaluated . Explained to pt EKG remains the same since Nov,2016. Encouraged pt to follow up with cardiologist after discharged.   R-Contracts for safety and continues to follow treatment plan, working on learning new coping skills.

## 2015-06-26 NOTE — Progress Notes (Signed)
D: patient alert and oriented x 4. Patient denies pain/SI/HI/AVH. Patient had complaint of heartburn during shift. Mylanta given at 0134 and at 0538. Patient did not go to group states, 'I am not going in there with at guy (AA speaker), he is a Sales promotion account executive. He has only been sober for 4 months. And his wife is a alcoholic." This Clinical research associate redirected patient on comments he was saying about AA speaker to this Clinical research associate. Patient requested medication for anxiety at 0540. PRN Librium given.  A: Staff to monitor Q 15 mins for safety. Encouragement and support offered. Scheduled medications administered per orders. R: Patient remains safe on the unit. Patient attended group tonight. Patient visible on hte unit and interacting with peers. Patient taking administered medications.

## 2015-06-26 NOTE — Progress Notes (Signed)
[facility] MD Progress Note  06/26/2015 3:49 PM Steven [name]  MRN:  [id number]   Subjective: "I feel a little better. 'm suicidal every morning; that is the first thing I think of when I wake up. Everyday I try to think of how I can hoodoo yall so I can get out here and do what I want."   Patient seems by this provider, case reviewed with social worker and nursing.  On evaluation:  Steven [name] reports that he is feeling a little better with depression; states that he feels safe as long as he is here. Patient also complains of reflux worsening at night when he is in bed laying down.  Instructed to use more pillows so that he would be laying flat, watch what he is eating; avoid things that he knows makes worse and not to lay down right after eating. States that he is tolerating his medications without adverse reaction; attending/participating group sessions; eating without any difficult.  Not sleeping well related to the reflux. At this time patient endorse SI daily with no plan/intent and is able to contract for safety.  He denies homicidal ideation, psychosis, and paranoia.    Principal Problem: MDD (major depressive disorder), recurrent severe, without psychosis (HCC) Diagnosis:   Patient Active Problem List   Diagnosis Date Noted  . MDD (major depressive disorder), recurrent severe, without psychosis (HCC) [F33.2] 06/18/2015  . Cocaine use disorder, moderate, dependence (HCC) [F14.20]   . Alcohol use disorder, moderate, dependence (HCC) [F10.20] 04/02/2015  . Alcohol abuse [F10.10] 03/08/2015  . Cocaine abuse [F14.10] 03/08/2015  . Substance induced mood disorder (HCC) [F19.94] 03/08/2015  . Suicidal ideation [R45.851]   . COPD (chronic obstructive pulmonary disease) (HCC) [J44.9] 02/09/2015  . Tobacco use disorder [F17.200] 02/09/2015  . Stimulant use disorder (cocaine) [F15.90] 02/09/2015  . Alcohol use disorder, severe, in sustained remission (HCC) [F10.21] 02/09/2015  . Ureteral stone  [N20.1] 12/31/2014  . 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 CAD s/p CABG 2012 [facility] [I25.810] 11/28/2011  . Hyperlipidemia LDL goal <100 [E78.5] 03/13/2007  . Essential hypertension [I10] 03/13/2007  . GERD [K21.9] 03/13/2007   Total Time spent with patient: 15 minutes  Past Psychiatric History: see admission H and P  Past Medical History:  Past Medical History  Diagnosis Date  . Coronary artery disease   . Hypertension   . Hypercholesteremia   . Kidney stone   . Tobacco use disorder   . Hx of CABG   . COPD (chronic obstructive pulmonary disease) (HCC)   . Bipolar disorder (manic depression) (HCC)   . Asthma     Past Surgical History  Procedure Laterality Date  . Coronary artery bypass graft    . Kidney stone surgery    . Appendectomy    . Lower extremity angiogram N/A 08/15/2012    Procedure: LOWER EXTREMITY ANGIOGRAM with possible PTA /stent;  Surgeon: Corky Crafts, MD;  Location: [facility] CATH LAB;  Service: Cardiovascular;  Laterality: N/A;  . Abdominal angiogram  08/15/2012    Procedure: ABDOMINAL ANGIOGRAM;  Surgeon: Corky Crafts, MD;  Location: [facility] CATH LAB;  Service: Cardiovascular;;  . Cardioversion N/A 08/28/2012    Procedure: Pseudo Compression;  Surgeon: Chuck Hint, MD;  Location: [facility] CATH LAB;  Service: Cardiovascular;  Laterality: N/A;  . Cystoscopy with retrograde pyelogram, ureteroscopy and stent placement Right 12/31/2014    Procedure: CYSTOSCOPY  RIGHT URETEROSCOPY WITH STONE RETREIVAL RIGHT RETROGRADE PYELOGRAM;  Surgeon: Luisa Hart  Sandria Manly, MD;  Location: [facility] ORS;  Service: Urology;  Laterality: Right;   Family History:  Family History  Problem Relation Age of Onset  . Heart disease Father   . Hyperlipidemia Father   . Heart disease Maternal Grandmother    Family Psychiatric  History: see admission H and P Social History:  History  Alcohol Use  . Yes    Comment: last drink 03/07/2015       History  Drug Use  . Yes  . Special: Cocaine, Marijuana    Comment: 10/23//2016    Social History   Social History  . Marital Status: Divorced    Spouse Name: N/A  . Number of Children: N/A  . Years of Education: N/A   Social History Main Topics  . Smoking status: Current Every Day Smoker -- 1.00 packs/day for 31 years    Types: Cigarettes  . Smokeless tobacco: Former Neurosurgeon    Quit date: 04/27/2013  . Alcohol Use: Yes     Comment: last drink 03/07/2015   . Drug Use: Yes    Special: Cocaine, Marijuana     Comment: 10/23//2016  . Sexual Activity: Yes    Birth Control/ Protection: Condom   Other Topics Concern  . None   Social History Narrative   Additional Social History:    History of alcohol / drug use?: Yes Longest period of sobriety (when/how long): 18 months within 5 yrs (18 months within 5 yrs) Negative Consequences of Use: Financial Withdrawal Symptoms: Tremors, Nausea / Vomiting, Fever / Chills                    Sleep: Fair, related to reflux  Appetite:  Good  Current Medications: Current Facility-Administered Medications  Medication Dose Route Frequency Provider Last Rate Last Dose  . acetaminophen (TYLENOL) tablet 650 mg  650 mg Oral Q4H PRN Beau Fanny, [name]   650 mg at 06/22/15 2052  . alum & mag hydroxide-simeth (MAALOX/MYLANTA) 200-200-20 MG/5ML suspension 30 mL  30 mL Oral Q4H PRN Beau Fanny, [name]   30 mL at 06/26/15 0538  . ARIPiprazole (ABILIFY) tablet 5 mg  5 mg Oral Daily Rachael Fee, MD   5 mg at 06/26/15 0848  . atorvastatin (LIPITOR) tablet 20 mg  20 mg Oral q1800 Beau Fanny, [name]   20 mg at 06/25/15 1610  . busPIRone (BUSPAR) tablet 7.5 mg  7.5 mg Oral TID Rachael Fee, MD   7.5 mg at 06/26/15 1126  . chlordiazePOXIDE (LIBRIUM) capsule 25 mg  25 mg Oral QID PRN Sanjuana Kava, NP   25 mg at 06/26/15 0540  . FLUoxetine (PROZAC) capsule 40 mg  40 mg Oral Daily Rachael Fee, MD   40 mg at 06/26/15 0845  . folic acid  (FOLVITE) tablet 1 mg  1 mg Oral Daily Beau Fanny, [name]   1 mg at 06/26/15 0846  . hydrOXYzine (ATARAX/VISTARIL) tablet 50 mg  50 mg Oral Q6H PRN Beau Fanny, [name]   50 mg at 06/26/15 1524  . ibuprofen (ADVIL,MOTRIN) tablet 600 mg  600 mg Oral Q8H PRN Beau Fanny, [name]   600 mg at 06/25/15 1126  . metFORMIN (GLUCOPHAGE) tablet 500 mg  500 mg Oral Q breakfast Beau Fanny, [name]   500 mg at 06/26/15 0846  . methocarbamol (ROBAXIN) tablet 500 mg  500 mg Oral BID Beau Fanny, [name]   500 mg at 06/26/15 0846  .  multivitamin with minerals tablet 1 tablet  1 tablet Oral Daily Rachael Fee, MD   1 tablet at 06/26/15 0846  . nicotine (NICODERM CQ - dosed in mg/24 hours) patch 21 mg  21 mg Transdermal Daily Beau Fanny, [name]   21 mg at 06/26/15 0846  . pantoprazole (PROTONIX) EC tablet 40 mg  40 mg Oral BID Rachael Fee, MD   40 mg at 06/26/15 [phone]  . penicillin v potassium (VEETID) tablet 500 mg  500 mg Oral 4 times per day Beau Fanny, [name]   500 mg at 06/26/15 1125  . QUEtiapine (SEROQUEL) tablet 100 mg  100 mg Oral QHS Sanjuana Kava, NP   100 mg at 06/25/15 2106  . QUEtiapine (SEROQUEL) tablet 25 mg  25 mg Oral TID Sanjuana Kava, NP   25 mg at 06/26/15 1126  . thiamine (VITAMIN B-1) tablet 100 mg  100 mg Oral Daily Rachael Fee, MD   100 mg at 06/26/15 0845  . traZODone (DESYREL) tablet 100 mg  100 mg Oral QHS PRN Beau Fanny, [name]   100 mg at 06/25/15 2106    Lab Results: No results found for this or any previous visit (from the past 48 hour(s)).  Physical Findings: AIMS: Facial and Oral Movements Muscles of Facial Expression: None, normal [name] and Perioral Area: None, normal Jaw: None, normal Tongue: None, normal,Extremity Movements Upper (arms, wrists, hands, fingers): None, normal Lower (legs, knees, ankles, toes): None, normal, Trunk Movements Neck, shoulders, hips: None, normal, Overall Severity Severity of abnormal movements (highest score from questions above): None,  normal Incapacitation due to abnormal movements: None, normal Patient's awareness of abnormal movements (rate only patient's report): No Awareness, Dental Status Current problems with teeth and/or dentures?: Yes Does patient usually wear dentures?: No  CIWA:  CIWA-Ar Total: 2 COWS:     Musculoskeletal: Strength & Muscle Tone: within normal limits Gait & Station: normal Patient leans: normal  Psychiatric Specialty Exam: Review of Systems  Constitutional: Negative.   HENT: Negative.   Eyes: Negative.   Respiratory: Negative.   Cardiovascular: Negative.   Gastrointestinal: Negative.   Genitourinary: Negative.   Musculoskeletal: Negative.   Skin: Negative.   Neurological: Negative.   Endo/Heme/Allergies: Negative.   Psychiatric/Behavioral: Positive for depression, suicidal ideas and substance abuse. The patient is nervous/anxious.     Blood pressure 102/57, pulse 92, temperature 97.6 F (36.4 C), temperature source Oral, resp. rate 16, height 5\' 9"  (1.753 m), weight 94.802 kg (209 lb), SpO2 100 %.Body mass index is 30.85 kg/(m^2).  General Appearance: Fairly Groomed  Patent attorney::  Fair  Speech:  Clear and Coherent  Volume:  Normal  Mood:  Anxious and Depressed  Affect:  Depressed and anxious  Thought Process:  Circumstantial  Orientation:  Full (Time, Place, and Person)  Thought Content:  Denies hallucinations, delusions, and paranoia  Suicidal Thoughts:  Yes.  without intent/plan  Homicidal Thoughts:  No  Memory:  Immediate;   Fair Recent;   Fair Remote;   Fair  Judgement:  Fair  Insight:  Present and Shallow  Psychomotor Activity:  Normal  Concentration:  Fair  Recall:  [employer] of Knowledge:Fair  Language: Fair  Akathisia:  No  Handed:  Right  AIMS (if indicated):     Assets:  Desire for Improvement  ADL's:  Intact  Cognition: WNL  Sleep:  Number of Hours: 5   Treatment Plan Summary: Daily contact with patient to assess and evaluate  symptoms and progress  in treatment and Medication management  Continue supportive approach/coping skills Cocaine abuse continue to work on relapse prevention plan Alcohol dependence; completed the detox protocol/work a relapse prevention plan Depression; continue the Prozac 40 mg with Abilify 5 mg augmentation Mood instability ruminative thinking; Continue the Seroquel 25 mg TID and 100 mg Q hs Anxiety; Continue the Buspar 7.5 mg BID Work with CBT/mindfulness  Delorean Knutzen, NP 06/26/2015, 3:49 PM

## 2015-06-27 MED ORDER — BENZTROPINE MESYLATE 0.5 MG PO TABS
0.5000 mg | ORAL_TABLET | Freq: Every day | ORAL | Status: DC
Start: 2015-06-27 — End: 2015-06-28
  Administered 2015-06-27 – 2015-06-28 (×2): 0.5 mg via ORAL
  Filled 2015-06-27 (×5): qty 1

## 2015-06-27 NOTE — [facility] Group Notes (Signed)
[facility] Group Notes:  (Nursing/MHT/Case Management/Adjunct)  Date:  06/27/2015  Time:  1:15 pm  Type of Therapy:  Nurse Education  Participation Level:  Active  Participation Quality:  Appropriate, Attentive, Sharing and Supportive  Affect:  Appropriate  Cognitive:  Alert and Oriented  Insight:  Appropriate  Engagement in Group:  Engaged and Supportive  Modes of Intervention:  Discussion and Education  Summary of Progress/Problems:  Group topic was [employer].  Discussed importance of setting daily goals.  He was attentive and supportive of others during the group.  He talked about how helpful AA had been for him in the past because it made him accountable for his recovery.    [name] Wandalene Abrams 06/27/2015, 2:13 PM
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
## 2015-06-27 NOTE — Progress Notes (Signed)
D: patient alert and oriented x 4. Patient denies pain/SI/HI/AVH. Patient had complaint of heartburn during shift. Mylanta given at 2301. Patient refused 12am dose of ABT.  A: Staff to monitor Q 15 mins for safety. Encouragement and support offered. Scheduled medications administered per orders. R: Patient remains safe on the unit. Patient attended group tonight. Patient visible on hte unit and interacting with peers. Patient taking administered medications.

## 2015-06-27 NOTE — [facility] Group Notes (Signed)
[facility] Group Notes: (Clinical Social Work)   06/27/2015      Type of Therapy:  Group Therapy   Participation Level:  Did Not Attend despite MHT prompting   Ambrose Mantle, LCSW 06/27/2015, 3:48 PM

## 2015-06-27 NOTE — Plan of Care (Signed)
Problem: Alteration in mood Goal: STG-Patient reports thoughts of self-harm to staff Outcome: Progressing Patient states he is having thoughts to harm self. Verbally contracts for safety.   Problem: Alteration in mood & ability to function due to Goal: STG-Patient will report withdrawal symptoms Outcome: Progressing Patient reports cravings, runny nose and tremors. (Tremors maybe due to side effect of meds.)

## 2015-06-27 NOTE — Progress Notes (Signed)
Patient up and visible around the unit. Affect flat, mood depressed. While patient does report improvement in mood he rates his depression, hopelessness and anxiety all at a 10/10. When asked about a goal patient writes, "hoping the medications will take effect today." When asked what he can do to meet his goal he responded, "nothing." Patient reporting chronic back pain of a 7/10. Medicated per orders, advil prn given along with prn librium per his request. Emotional support offered. Patient encouraged to employ coping skills as opposed to relying solely on medications. Reviewed self inventory. Patient verbalized understanding. On reassess, patient reports pain has decreased to a 4/10, anxiety decreased. Patient endorsing passive SI however verbally contracts for safety. No HI. Patient remains safe on level III obs. Lawrence [place]se[place]

## 2015-06-27 NOTE — Progress Notes (Signed)
Pt attended AA group this evening.  

## 2015-06-27 NOTE — Progress Notes (Signed)
D:Patient in the hallway on approach.  Patient states his day was ok but states he has been having some tremors.  Patient states he has been taking a lot of medications an states he thinks they are working.  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  Patient denies SI/HI and denies AVH. A: Staff to monitor Q 15 mins for safety.  Encouragement and support offered.  Scheduled medications administered per orders.  Trazodone administered prn for sleep/  R: Patient remains safe on the unit.  Patient attended group tonight.  Patient visible on the unit and interacting with peers.  Patient taking administered medications.

## 2015-06-28 MED ORDER — QUETIAPINE FUMARATE 100 MG PO TABS: 100.0000 mg | ORAL_TABLET | Freq: Every day | ORAL | Status: DC

## 2015-06-28 MED ORDER — METHOCARBAMOL 500 MG PO TABS: 500.0000 mg | ORAL_TABLET | Freq: Two times a day (BID) | ORAL | Status: DC

## 2015-06-28 MED ORDER — PANTOPRAZOLE SODIUM 40 MG PO TBEC: 40.0000 mg | DELAYED_RELEASE_TABLET | Freq: Two times a day (BID) | ORAL | Status: DC

## 2015-06-28 MED ORDER — TRAZODONE HCL 100 MG PO TABS: 100.0000 mg | ORAL_TABLET | Freq: Every evening | ORAL | Status: DC | PRN

## 2015-06-28 MED ORDER — METHOCARBAMOL 500 MG PO TABS
500.0000 mg | ORAL_TABLET | Freq: Two times a day (BID) | ORAL | Status: DC
Start: 2015-06-28 — End: 2015-06-28

## 2015-06-28 MED ORDER — NICOTINE 21 MG/24HR TD PT24: 21.0000 mg | MEDICATED_PATCH | Freq: Every day | TRANSDERMAL | Status: DC

## 2015-06-28 MED ORDER — ATORVASTATIN CALCIUM 20 MG PO TABS: 20.0000 mg | ORAL_TABLET | Freq: Every day | ORAL | Status: DC

## 2015-06-28 MED ORDER — BENZTROPINE MESYLATE 0.5 MG PO TABS: 0.5000 mg | ORAL_TABLET | Freq: Every day | ORAL | Status: DC

## 2015-06-28 MED ORDER — ARIPIPRAZOLE 5 MG PO TABS: 5.0000 mg | ORAL_TABLET | Freq: Every day | ORAL | Status: DC

## 2015-06-28 MED ORDER — TRAZODONE HCL 100 MG PO TABS
100.0000 mg | ORAL_TABLET | Freq: Every evening | ORAL | Status: DC | PRN
Start: 2015-06-28 — End: 2016-02-23

## 2015-06-28 MED ORDER — METFORMIN HCL 500 MG PO TABS: 500.0000 mg | ORAL_TABLET | Freq: Every day | ORAL | Status: DC

## 2015-06-28 MED ORDER — LIDOCAINE 5 % EX PTCH: 1.0000 | MEDICATED_PATCH | CUTANEOUS | Status: DC

## 2015-06-28 MED ORDER — ARIPIPRAZOLE 5 MG PO TABS
5.0000 mg | ORAL_TABLET | Freq: Every day | ORAL | Status: DC
Start: 2015-06-28 — End: 2015-06-28

## 2015-06-28 MED ORDER — QUETIAPINE FUMARATE 25 MG PO TABS: 25.0000 mg | ORAL_TABLET | Freq: Three times a day (TID) | ORAL | Status: DC

## 2015-06-28 MED ORDER — HYDROXYZINE HCL 50 MG PO TABS: 50.0000 mg | ORAL_TABLET | Freq: Four times a day (QID) | ORAL | Status: DC | PRN

## 2015-06-28 MED ORDER — QUETIAPINE FUMARATE 25 MG PO TABS
25.0000 mg | ORAL_TABLET | Freq: Three times a day (TID) | ORAL | Status: DC
Start: 2015-06-28 — End: 2016-02-23

## 2015-06-28 MED ORDER — FLUOXETINE HCL 40 MG PO CAPS: 40.0000 mg | ORAL_CAPSULE | Freq: Every day | ORAL | Status: DC

## 2015-06-28 MED ORDER — BUSPIRONE HCL 7.5 MG PO TABS: 7.5000 mg | ORAL_TABLET | Freq: Three times a day (TID) | ORAL | Status: DC

## 2015-06-28 MED ORDER — IBUPROFEN 800 MG PO TABS: 800.0000 mg | ORAL_TABLET | Freq: Three times a day (TID) | ORAL | Status: DC

## 2015-06-28 NOTE — [facility] Group Notes (Signed)
[facility] LCSW Aftercare Discharge Planning Group Note   06/28/2015 11:49 AM  Participation Quality:  Invited. DID NOT ATTEND. Pt chose to remain in bed.   Smart, Steven Torgeson LCSW

## 2015-06-28 NOTE — Progress Notes (Addendum)
  [facility] Adult Case Management Discharge Plan :  Will you be returning to the same living situation after discharge:  No.Screening for admission at [facility] on 2/14 at 8:00AM At discharge, do you have transportation home?: Pt leaving today per Dr. Dub Mikes. Bus pass in chart. Do you have the ability to pay for your medications: Yes,  mental health  Release of information consent forms completed and submitted to medical records by CSW.  Patient to Follow up at: Follow-up Information    Follow up with [facility] On 06/29/2015.   Why:  Admission screening on Tues Feb. 14th at 8am. Please bring your [place] ID. Call if you need to reschedule.    Contact information:   [name], [place] [address] [phone]      Next level of care provider has access to [employer] Link:no  Safety Planning and Suicide Prevention discussed: Yes,  SPE completed with pt, as he declined to consent to family contact.   Have you used any form of tobacco in the last 30 days? (Cigarettes, Smokeless Tobacco, Cigars, and/or Pipes): Yes  Has patient been referred to the Quitline?: Yes, faxed on 06/19/15  Patient has been referred for addiction treatment: Yes-see above.   Smart, Steven Marrazzo LCSW 06/28/2015, 3:24 PM

## 2015-06-28 NOTE — Discharge Summary (Signed)
Physician Discharge Summary Note  Patient:  Steven [name] is an 45 y.o., male MRN:  [id number] DOB:  April 01, 1971 Patient phone:  [phone] (home)  Patient address:   [address] [place] [address],   Total Time spent with patient: Greater than 30 minutes  Date of Admission:  06/18/2015  Date of Discharge: 06-28-15  Reason for Admission: Worsening symptoms of depression/suicidal ideations  Principal Problem: MDD (major depressive disorder), recurrent severe, without psychosis [facility])  Discharge Diagnoses: Patient Active Problem List   Diagnosis Date Noted  . MDD (major depressive disorder), recurrent severe, without psychosis (HCC) [F33.2] 06/18/2015  . Cocaine use disorder, moderate, dependence (HCC) [F14.20]   . Alcohol use disorder, moderate, dependence (HCC) [F10.20] 04/02/2015  . Alcohol abuse [F10.10] 03/08/2015  . Cocaine abuse [F14.10] 03/08/2015  . Substance induced mood disorder (HCC) [F19.94] 03/08/2015  . Suicidal ideation [R45.851]   . COPD (chronic obstructive pulmonary disease) (HCC) [J44.9] 02/09/2015  . Tobacco use disorder [F17.200] 02/09/2015  . Stimulant use disorder (cocaine) [F15.90] 02/09/2015  . Alcohol use disorder, severe, in sustained remission (HCC) [F10.21] 02/09/2015  . Ureteral stone [N20.1] 12/31/2014  . 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 CAD s/p CABG 2012 [facility] [I25.810] 11/28/2011  . Hyperlipidemia LDL goal <100 [E78.5] 03/13/2007  . Essential hypertension [I10] 03/13/2007  . GERD [K21.9] 03/13/2007   Past Psychiatric History: Alcohol use disorder, chronic  Past Medical History:  Past Medical History  Diagnosis Date  . Coronary artery disease   . Hypertension   . Hypercholesteremia   . Kidney stone   . Tobacco use disorder   . Hx of CABG   . COPD (chronic obstructive pulmonary disease) (HCC)   . Bipolar disorder (manic depression) (HCC)   . Asthma     Past Surgical History   Procedure Laterality Date  . Coronary artery bypass graft    . Kidney stone surgery    . Appendectomy    . Lower extremity angiogram N/A 08/15/2012    Procedure: LOWER EXTREMITY ANGIOGRAM with possible PTA /stent;  Surgeon: Corky Crafts, MD;  Location: [facility] CATH LAB;  Service: Cardiovascular;  Laterality: N/A;  . Abdominal angiogram  08/15/2012    Procedure: ABDOMINAL ANGIOGRAM;  Surgeon: Corky Crafts, MD;  Location: [facility] CATH LAB;  Service: Cardiovascular;;  . Cardioversion N/A 08/28/2012    Procedure: Pseudo Compression;  Surgeon: Chuck Hint, MD;  Location: [facility] CATH LAB;  Service: Cardiovascular;  Laterality: N/A;  . Cystoscopy with retrograde pyelogram, ureteroscopy and stent placement Right 12/31/2014    Procedure: CYSTOSCOPY  RIGHT URETEROSCOPY WITH STONE RETREIVAL RIGHT RETROGRADE PYELOGRAM;  Surgeon: Malen Gauze, MD;  Location: [facility] ORS;  Service: Urology;  Laterality: Right;   Family History:  Family History  Problem Relation Age of Onset  . Heart disease Father   . Hyperlipidemia Father   . Heart disease Maternal Grandmother    Family Psychiatric  History:  Denies  Social History:  History  Alcohol Use  . Yes    Comment: last drink 03/07/2015      History  Drug Use  . Yes  . Special: Cocaine, Marijuana    Comment: 10/23//2016    Social History   Social History  . Marital Status: Divorced    Spouse Name: N/A  . Number of Children: N/A  . Years of Education: N/A   Social History Main Topics  . Smoking status: Current Every Day Smoker -- 1.00 packs/day  for 31 years    Types: Cigarettes  . Smokeless tobacco: Former Neurosurgeon    Quit date: 04/27/2013  . Alcohol Use: Yes     Comment: last drink 03/07/2015   . Drug Use: Yes    Special: Cocaine, Marijuana     Comment: 10/23//2016  . Sexual Activity: Yes    Birth Control/ Protection: Condom   Other Topics Concern  . None   Social History Narrative   Hospital Course:  Cornell is a 45 year old  Caucasian male. Admitted to [facility] from the [facility] ED with complaints of suicidal ideations. He reports that this is his third admission to this hospital, all related to drugs & feeling suicidal. During this assessment, Matheson reports, "I rode the bus to the [facility] ED on Wednesday, 3 days ago. I just felt like I could not take any more shit. Every day, it is one thing after another. I got tired of it. It started 6 months ago when I lost my apartment, then my motor cycle, then my job because I did not have my motor cycle to get to & from work. I did not attempt to hurt myself this time. I had slit my wrist in a suicide attempt 5 years ago. It required stitches to close the wound up. That time, my frustration & depressed were related to girlfriend trouble. My depression is ongoing for many years due to a combination of things (drugs, alcohol & life). I was sober from substances for about 18 months, relapsed last December, 2016 because I was bored. I last used cocaine a couple of days ago. Diagnosed with Bipolar disorder in the past, I'm not on any medications right now. Other than AA meetings, I have not really had a treatment for substance abuse issues. I get a lot of racing thoughts & panic attacks.  Jenner was admitted to the [facility] adult unit with his UDS test results positive for cocaine & THC, He also admitted having been drinking heavily as well. However, his BAL on admission was <5 per toxicology test reports. Although, presented feeling very anxious & depressed, [place] was not presenting with any substance withdrawal symptoms. He did not receive any detoxification treatments. Rather, he was in need & received mood stabilization treatments.   During the course of his hospitalization, Yuriy was medicated & discharged on; Abilify 5 mg for mood control, Cogentin 0.5 mg for EPS, Buspar 7.5 mg for anxiety, Fluoxetine 40 mg for depression, Hydroxyzine 50 mg for anxiety, Seroquel 25 mg for  agitation, Seroquel 100 mg for mood control & Trazodone 100 mg for insomnia. He also received other medication regimen for the other medical issues that he presented. Phelan was enrolled & participated in the Group counseling sessions being offered and held on this unit. He learned coping skills that should help him cope better & maintain mood stability after discharge. He tolerated his treatment regimen without any adverse effects or reactions reported.  Jasen's symptoms responded well to his treatment regimen. This evidenced by his reports of improved mood & presentation of good affect. He is curently being discharged as his mood has stabilized. However, he is going home on 2 separate antipsychotic medications (Abilify & Seroquel) because his symptoms did not respond well under an antipsychotic monotherapy.The failed antipsychotic monotherapies include; Haldol, Seroquel & Latuda.  However, a combination therapy of Abilify & Seroquel seem to have helped improve his symptoms. As a result, it is necessary for Jailan to continue on these  combination therapies to achieve maximum symptom control after discharge. However, in the event that his symptoms happen to improve or decrease, he can then be titrated down to an antipsychotic monotherapy to avoid risks of metabolic syndrome and other related adverse effects.This has to be done within the proper evaluation & judgement of his outpatient provider.   [name] has dual diagnosis of polysubstance dependence & other mental health issues. Part of his discharge plan is referral & placement to a residential treatment center for further substance abuse treatment after discharge as well an outpatient clinic for routine psychiatric care & medication management. He is being discharged to is home today with family & will leave tomorrow morning to the [facility] treatment Center in [place], [place]. He will follow-up care for routine psychiatric care & medication management  as noted below. He is provided with all the necessary information required to make these appointments without problems. Upon discharge, he adamantly denies any SIHI, AVH, delusional thoughts and or paranoia. He was provided with a 14 days worth, supply samples of his [facility] discharge medications. He left [facility] with all personal belongings in no apparent distress. Transportation per city bus. BHH assisted with bus pass. Physical Findings: AIMS: Facial and Oral Movements Muscles of Facial Expression: None, normal Lips and Perioral Area: None, normal Jaw: None, normal Tongue: None, normal,Extremity Movements Upper (arms, wrists, hands, fingers): None, normal Lower (legs, knees, ankles, toes): None, normal, Trunk Movements Neck, shoulders, hips: None, normal, Overall Severity Severity of abnormal movements (highest score from questions above): None, normal Incapacitation due to abnormal movements: None, normal Patient's awareness of abnormal movements (rate only patient's report): No Awareness, Dental Status Current problems with teeth and/or dentures?: Yes Does patient usually wear dentures?: Yes  CIWA:  CIWA-Ar Total: 2 COWS:     Musculoskeletal: Strength & Muscle Tone: within normal limits Gait & Station: normal Patient leans: N/A  Psychiatric Specialty Exam: Review of Systems  Constitutional: Negative.   HENT: Negative.   Eyes: Negative.   Respiratory: Negative.   Cardiovascular: Negative.   Gastrointestinal: Negative.   Genitourinary: Negative.   Musculoskeletal: Negative.   Skin: Negative.   Neurological: Negative.   Endo/Heme/Allergies: Negative.   Psychiatric/Behavioral: Positive for depression (Stable) and substance abuse (Alcoholism, Cocaine/THC dependence). Negative for suicidal ideas, hallucinations and memory loss. The patient has insomnia (Stable). The patient is not nervous/anxious.   All other systems reviewed and are negative.   Blood pressure 99/56, pulse 86,  temperature 98 F (36.7 C), temperature source Oral, resp. rate 16, height 5\' 9"  (1.753 m), weight 94.802 kg (209 lb), SpO2 100 %.Body mass index is 30.85 kg/(m^2).  See Md's SRA  Has this patient used any form of tobacco in the last 30 days? (Cigarettes, Smokeless Tobacco, Cigars, and/or Pipes) Yes, Yes, A prescription for an FDA-approved tobacco cessation medication was offered at discharge and the patient refused  Metabolic Disorder Labs:  Lab Results  Component Value Date   HGBA1C 6.5* 04/06/2015   MPG 140 04/06/2015   MPG 97 04/27/2013   Lab Results  Component Value Date   PROLACTIN 18.2* 04/06/2015   Lab Results  Component Value Date   CHOL 284* 04/06/2015   TRIG 378* 04/06/2015   HDL 43 04/06/2015   CHOLHDL 6.6 04/06/2015   VLDL 76* 04/06/2015   LDLCALC 165* 04/06/2015   LDLCALC 114* 02/10/2015    See Psychiatric Specialty Exam and Suicide Risk Assessment completed by Attending Physician prior to discharge.  Discharge destination:  Home, then  to [facility] on 06-29-15  Is patient on multiple antipsychotic therapies at discharge:  Yes,    Do you recommend tapering to monotherapy for antipsychotics?  Yes    Has Patient had three or more failed trials of antipsychotic monotherapy by history:  Yes,   Antipsychotic medications that previously failed include:   1.  Haldol., 2.  Latuda. and 3.  Seroquel.,   Recommended Plan for Multiple Antipsychotic Therapies: And because patient has not been able to achieve symptoms control under an antipsychotic monotherapy, he is currently receiving & being discharged on a 2 separate antipsychotic medications Abilify & Seroquel which seem effective at this time. It will benefit patient to continue on these combination antipsychotic therapies as recommended. However, as symptoms continue to improve, patient may be titrated down to an antipsychotic monotherapy. This has to be done within the discretion & proper judgement of his  outpatient provider.    Medication List    STOP taking these medications        acetaminophen 500 MG tablet  Commonly known as:  TYLENOL     aspirin 81 MG tablet     HYDROcodone-acetaminophen 5-325 MG tablet  Commonly known as:  NORCO/VICODIN     lurasidone 40 MG Tabs tablet  Commonly known as:  LATUDA     ondansetron 8 MG disintegrating tablet  Commonly known as:  ZOFRAN ODT     oxyCODONE-acetaminophen 5-325 MG tablet  Commonly known as:  PERCOCET/ROXICET      TAKE these medications      Indication   ARIPiprazole 5 MG tablet  Commonly known as:  ABILIFY  Take 1 tablet (5 mg total) by mouth daily. For mood control   Indication:  Mood stabilization     atorvastatin 20 MG tablet  Commonly known as:  LIPITOR  Take 1 tablet (20 mg total) by mouth daily at 6 PM. For high cholesterol   Indication:  Inherited Heterozygous Hypercholesterolemia, Hyperlipidemia     benztropine 0.5 MG tablet  Commonly known as:  COGENTIN  Take 1 tablet (0.5 mg total) by mouth daily. For prevention of drug induced tremors   Indication:  Extrapyramidal Reaction caused by Medications     busPIRone 7.5 MG tablet  Commonly known as:  BUSPAR  Take 1 tablet (7.5 mg total) by mouth 3 (three) times daily. For anxiety   Indication:  Generalized Anxiety Disorder     FLUoxetine 40 MG capsule  Commonly known as:  PROZAC  Take 1 capsule (40 mg total) by mouth daily. For depression   Indication:  Major Depressive Disorder     hydrOXYzine 50 MG tablet  Commonly known as:  ATARAX/VISTARIL  Take 1 tablet (50 mg total) by mouth every 6 (six) hours as needed for anxiety.   Indication:  Sedation, Tension, Anxiety     ibuprofen 800 MG tablet  Commonly known as:  ADVIL,MOTRIN  Take 1 tablet (800 mg total) by mouth 3 (three) times daily. For pain   Indication:  Joint Damage causing Pain and Loss of Function, Pain     lidocaine 5 %  Commonly known as:  LIDODERM  Place 1 patch onto the skin daily. Remove &  Discard patch within 12 hours or as directed by MD: For pain management   Indication:  Pain management     metFORMIN 500 MG tablet  Commonly known as:  GLUCOPHAGE  Take 1 tablet (500 mg total) by mouth daily with breakfast. For diabetes management   Indication:  Type  2 Diabetes     methocarbamol 500 MG tablet  Commonly known as:  ROBAXIN  Take 1 tablet (500 mg total) by mouth 2 (two) times daily. For muscle pain   Indication:  Musculoskeletal Pain     nicotine 21 mg/24hr patch  Commonly known as:  NICODERM CQ - dosed in mg/24 hours  Place 1 patch (21 mg total) onto the skin daily. For smoking cessation   Indication:  Nicotine Addiction     pantoprazole 40 MG tablet  Commonly known as:  PROTONIX  Take 1 tablet (40 mg total) by mouth 2 (two) times daily. For acid reflux   Indication:  Gastroesophageal Reflux Disease     QUEtiapine 100 MG tablet  Commonly known as:  SEROQUEL  Take 1 tablet (100 mg total) by mouth at bedtime. For mood control   Indication:  Mood control     QUEtiapine 25 MG tablet  Commonly known as:  SEROQUEL  Take 1 tablet (25 mg total) by mouth 3 (three) times daily. For agitation   Indication:  Agitation     traZODone 100 MG tablet  Commonly known as:  DESYREL  Take 1 tablet (100 mg total) by mouth at bedtime as needed for sleep.   Indication:  Trouble Sleeping       Follow-up Information    Follow up with [facility] On 06/29/2015.   Why:  Admission screening on Tues Feb. 14th at 8am. Please bring your [place] ID. Call if you need to reschedule.    Contact information:   [address] [place], [place] [address] [phone]     Follow-up recommendations: Activity:  As tolerated Diet: As recommended by your primary care doctor. Keep all scheduled follow-up appointments as recommended.    Take all your medications as prescribed by your mental healthcare provider. Report any adverse effects and or reactions from your medicines  to your outpatient provider promptly. Patient is instructed and cautioned to not engage in alcohol and or illegal drug use while on prescription medicines. In the event of worsening symptoms, patient is instructed to call the crisis hotline, 911 and or go to the nearest ED for appropriate evaluation and treatment of symptoms. Follow-up with your primary care provider for your other medical issues, concerns and or health care needs.  Comments:    SignedSanjuana Kava PMHNP-BC 06/28/2015, 4:19 PM   I personally assessed the patient and formulated the plan Madie Reno A. Dub Mikes, M.D.

## 2015-06-28 NOTE — Progress Notes (Signed)
D: Steven [name] has been calm, dysphoric, and cooperative this a.m. He has complained of tremors and anxiety. He admits some SI but contracts for safety on unit. No AVH/HI.  A: Meds given as ordered, including PRN Librium 25 mg with results pending. Q15 safety checks maintained. Support/encouragement offered. R: Pt remains free from harm and continues with treatment. Will continue to monitor for needs/safety.

## 2015-06-28 NOTE — [facility]HH Suicide Risk Assessment (Signed)
[facility] Discharge Suicide Risk Assessment   Principal Problem: MDD (major depressive disorder), recurrent severe, without psychosis (HCC) Discharge Diagnoses:  Patient Active Problem List   Diagnosis Date Noted  . MDD (major depressive disorder), recurrent severe, without psychosis (HCC) [F33.2] 06/18/2015  . Cocaine use disorder, moderate, dependence (HCC) [F14.20]   . Alcohol use disorder, moderate, dependence (HCC) [F10.20] 04/02/2015  . Alcohol abuse [F10.10] 03/08/2015  . Cocaine abuse [F14.10] 03/08/2015  . Substance induced mood disorder (HCC) [F19.94] 03/08/2015  . Suicidal ideation [R45.851]   . COPD (chronic obstructive pulmonary disease) (HCC) [J44.9] 02/09/2015  . Tobacco use disorder [F17.200] 02/09/2015  . Stimulant use disorder (cocaine) [F15.90] 02/09/2015  . Alcohol use disorder, severe, in sustained remission (HCC) [F10.21] 02/09/2015  . Ureteral stone [N20.1] 12/31/2014  . 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 CAD s/p CABG 2012 [facility] [I25.810] 11/28/2011  . Hyperlipidemia LDL goal <100 [E78.5] 03/13/2007  . Essential hypertension [I10] 03/13/2007  . GERD [K21.9] 03/13/2007    Total Time spent with patient: 20 minutes  Musculoskeletal: Strength & Muscle Tone: within normal limits Gait & Station: normal Patient leans: normal  Psychiatric Specialty Exam: Review of Systems  Constitutional: Negative.   HENT: Negative.   Eyes: Negative.   Respiratory: Negative.   Cardiovascular: Negative.   Gastrointestinal: Negative.   Genitourinary: Negative.   Musculoskeletal: Negative.   Skin: Negative.   Neurological: Negative.   Endo/Heme/Allergies: Negative.   Psychiatric/Behavioral: Positive for depression and substance abuse.    Blood pressure 99/56, pulse 86, temperature 98 F (36.7 C), temperature source Oral, resp. rate 16, height  (1.753 m), weight 94.802 kg (209 lb), SpO2 100 %.Body mass index is 30.85 kg/(m^2).   General Appearance: Fairly Groomed  Patent attorney::  Fair  Speech:  Clear and Coherent409  Volume:  Normal  Mood:  Euthymic  Affect:  Appropriate  Thought Process:  Coherent and Goal Directed  Orientation:  Full (Time, Place, and Person)  Thought Content:  plans as he moves on, relapse prevention plan  Suicidal Thoughts:  No  Homicidal Thoughts:  No  Memory:  Immediate;   Fair Recent;   Fair Remote;   Fair  Judgement:  Fair  Insight:  Present and Shallow  Psychomotor Activity:  Normal  Concentration:  Fair  Recall:  [employer] of Knowledge:Fair  Language: Fair  Akathisia:  No  Handed:  Right  AIMS (if indicated):     Assets:  Desire for Improvement  Sleep:  Number of Hours: 5.25  Cognition: WNL  ADL's:  Intact  In full contact with reality. There are no active S/S of withdrawal. There are no active SI plans or intent. He is wanting to be D/C today, states he feels better. States he thinks the Prozac and the Abilify are helping his depression. He wants to leave today so he can get his things out of where he was staying. His father is going to drive from [place] to help him out.  Mental Status Per Nursing Assessment::   On Admission:  Suicidal ideation indicated by patient, Suicide plan  Demographic Factors:  Male and Caucasian  Loss Factors: Decline in physical health  Historical Factors: NA  Risk Reduction Factors:   Sense of responsibility to family  Continued Clinical Symptoms:  Depression:   Comorbid alcohol abuse/dependence Alcohol/Substance Abuse/Dependencies  Cognitive Features That Contribute To Risk:  None    Suicide Risk:  Minimal: No identifiable suicidal ideation.  Patients presenting with no  risk factors but with morbid ruminations; may be classified as minimal risk based on the severity of the depressive symptoms  Follow-up Information    Follow up with [facility] On 06/29/2015.   Why:  Admission screening on Tues Feb. 14th at 8am. Please bring  your [place] ID. Call if you need to reschedule.    Contact information:   [address] [place], [place] [address] [phone]      Plan Of Care/Follow-up recommendations:  Activity:  as tolerated Diet:  regular  Andric Kerce A, MD 06/28/2015, 5:11 PM

## 2015-06-28 NOTE — Progress Notes (Signed)
Patient ID: Steven [name], male   DOB: 03/22/1971, 45 y.o.   MRN: [id number] Blayze was discharged to lobby after belongings were returned and signed for. Also, AVS, SRA, and transition summary were signed for. Pt verbalized readiness for discharge and verbalized understanding of all discharge information, meds, and appointments.

## 2015-06-28 NOTE — Plan of Care (Signed)
Problem: Ineffective individual coping Goal: STG: Patient will remain free from self harm Outcome: Progressing Pt is able to contract for safety.  Goal: STG-Increase in ability to manage activities of daily living Outcome: Adequate for Discharge Pt is independent with ADLs.
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
## 2015-06-28 NOTE — [facility] Group Notes (Signed)
[facility] LCSW Group Therapy  06/28/2015 1:20 PM  Type of Therapy:  Group Therapy  Participation Level:  Active  Participation Quality:  Attentive and Monopolizing  Affect:  Appropriate  Cognitive:  Appropriate  Insight:  Developing/Improving  Engagement in Therapy:  Engaged  Modes of Intervention:  Confrontation, Discussion, Education, Exploration, Problem-solving, Rapport Building, Socialization and Support  Summary of Progress/Problems: Today's Topic: Overcoming Obstacles. Patients identified one short term goal and potential obstacles in reaching this goal. Patients processed barriers involved in overcoming these obstacles. Patients identified steps necessary for overcoming these obstacles and explored motivation (internal and external) for facing these difficulties head on. Patient drew from his experiences in 12 step programs to describe overcoming obstacle of anger at parents for abandonment, explained process of making amends to group and described positive benefits of forgiveness.  Raised issue of shame as significant barrier to progress, stemming from inability to forgive self for past misdeeds.   Santa Genera, LCSW Lead Clinical Social Worker Phone:  [phone]

## 2016-02-20 ENCOUNTER — Encounter (HOSPITAL_COMMUNITY): Payer: Self-pay | Admitting: Oncology

## 2016-02-20 ENCOUNTER — Emergency Department (HOSPITAL_COMMUNITY): Payer: Self-pay

## 2016-02-20 ENCOUNTER — Emergency Department (HOSPITAL_COMMUNITY)
Admission: EM | Admit: 2016-02-20 | Discharge: 2016-02-21 | Disposition: A | Discharge status: Other Acute Inpatient Hospital | Payer: No Typology Code available for payment source | Attending: Emergency Medicine | Admitting: Emergency Medicine

## 2016-02-20 DIAGNOSIS — Z5181 Encounter for therapeutic drug level monitoring: Secondary | ICD-10-CM | POA: Insufficient documentation

## 2016-02-20 DIAGNOSIS — R0789 Other chest pain: Secondary | ICD-10-CM | POA: Insufficient documentation

## 2016-02-20 DIAGNOSIS — J449 Chronic obstructive pulmonary disease, unspecified: Secondary | ICD-10-CM | POA: Insufficient documentation

## 2016-02-20 DIAGNOSIS — R45851 Suicidal ideations: Secondary | ICD-10-CM

## 2016-02-20 DIAGNOSIS — F17210 Nicotine dependence, cigarettes, uncomplicated: Secondary | ICD-10-CM | POA: Insufficient documentation

## 2016-02-20 DIAGNOSIS — I2510 Atherosclerotic heart disease of native coronary artery without angina pectoris: Secondary | ICD-10-CM | POA: Insufficient documentation

## 2016-02-20 DIAGNOSIS — J45909 Unspecified asthma, uncomplicated: Secondary | ICD-10-CM | POA: Insufficient documentation

## 2016-02-20 DIAGNOSIS — I10 Essential (primary) hypertension: Secondary | ICD-10-CM | POA: Insufficient documentation

## 2016-02-20 DIAGNOSIS — F1414 Cocaine abuse with cocaine-induced mood disorder: Secondary | ICD-10-CM | POA: Insufficient documentation

## 2016-02-20 LAB — RAPID URINE DRUG SCREEN, HOSP PERFORMED
Amphetamines: NOT DETECTED
BARBITURATES: NOT DETECTED
BENZODIAZEPINES: NOT DETECTED
COCAINE: POSITIVE — AB
OPIATES: POSITIVE — AB
Tetrahydrocannabinol: NOT DETECTED

## 2016-02-20 LAB — COMPREHENSIVE METABOLIC PANEL
ALT: 52 U/L (ref 17–63)
AST: 43 U/L — AB (ref 15–41)
Albumin: 4.5 g/dL (ref 3.5–5.0)
Alkaline Phosphatase: 74 U/L (ref 38–126)
Anion gap: 9 (ref 5–15)
BUN: 26 mg/dL — AB (ref 6–20)
CALCIUM: 9.1 mg/dL (ref 8.9–10.3)
CHLORIDE: 106 mmol/L (ref 101–111)
CO2: 23 mmol/L (ref 22–32)
CREATININE: 1.29 mg/dL — AB (ref 0.61–1.24)
Glucose, Bld: 110 mg/dL — ABNORMAL HIGH (ref 65–99)
Potassium: 4.1 mmol/L (ref 3.5–5.1)
Sodium: 138 mmol/L (ref 135–145)
Total Bilirubin: 0.6 mg/dL (ref 0.3–1.2)
Total Protein: 7.9 g/dL (ref 6.5–8.1)

## 2016-02-20 LAB — CBC
HCT: 45.1 % (ref 39.0–52.0)
HEMOGLOBIN: 15.4 g/dL (ref 13.0–17.0)
MCH: 32.6 pg (ref 26.0–34.0)
MCHC: 34.1 g/dL (ref 30.0–36.0)
MCV: 95.6 fL (ref 78.0–100.0)
Platelets: 281 10*3/uL (ref 150–400)
RBC: 4.72 MIL/uL (ref 4.22–5.81)
RDW: 14.9 % (ref 11.5–15.5)
WBC: 9.4 10*3/uL (ref 4.0–10.5)

## 2016-02-20 LAB — ETHANOL

## 2016-02-20 LAB — SALICYLATE LEVEL

## 2016-02-20 LAB — ACETAMINOPHEN LEVEL: Acetaminophen (Tylenol), Serum: <10 ug/mL — ABNORMAL LOW (ref 10–30)

## 2016-02-20 LAB — I-STAT TROPONIN, ED: Troponin i, poc: 0.01 ng/mL (ref 0.00–0.08)

## 2016-02-20 NOTE — ED Triage Notes (Signed)
Pt c/o suicidal ideation x several weeks without specific trigger, has considered multiple options for a plan but denies specific plan. No HI/AVH.  Pt reports pain in left chest s/t muscle infection, has already been evalauted for this and is being treated with antibiotic.

## 2016-02-20 NOTE — ED Notes (Signed)
Pt. Transferred to [facility] from ED to room 34 after screening for contraband. Report to include Situation, Background, Assessment and [facility]ations from [facility]. Pt. Oriented to unit including Q15 minute rounds as well as the security cameras for their protection. Patient is alert and oriented, warm and dry in no acute distress. Patient denies HI, and AVH. Pt. States he has SI with a plan he didn't share. Pt. Encouraged to let me know if needs arise.

## 2016-02-20 NOTE — ED Notes (Signed)
Patient noted in room. No complaints, stable, in no acute distress. Q15 minute rounds and monitoring via [employer] to continue. Sprite given.

## 2016-02-20 NOTE — ED Notes (Signed)
Patient noted in room. No complaints, stable, in no acute distress. Q15 minute rounds and monitoring via Security Cameras to continue.  
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
## 2016-02-20 NOTE — ED Provider Notes (Signed)
[facility]-EMERGENCY DEPT Provider Note   CSN: [id number] Arrival date & time: 02/20/16  1840     History   Chief Complaint Chief Complaint  Patient presents with  . Suicidal    HPI Steven [name] is a 45 y.o. male.  Patient is a 45 year old male with past medical history of bipolar disorder, COPD, hyperlipidemia, hypertension and CAD s/p CABG who presents the ED with suicidal ideation. Patient reports he has been feeling suicidal for the past month but notes it has worsened over the past few days. He reports "I no longer want to deal with this anymore". Patient reports having SI with multiple plans. He states he has thought about overdosing on heroin or crashing on his motorcycle. Denies any attempts. Denies HI or auditory/visual hallucinations. Patient reports drinking 1 beer and snorting2 lines of cocaine 3 days ago. Denies any other recent alcohol or drug use. Patient denies taking any medications at home.  He also reports he has had right-sided chest pain for the past week. He notes he was seen in the ED last night and diagnosed with chest wall pain and discharged home with antibiotics. Patient denies filling prescription due to money problems. Denies any recent fall or injury. He reports his chest pain feels like he broke his ribs and denies it feeling similar to chest pain he has had in the past related to CAD. He notes pain is worse with movement, deep breathing or coughing and is alleviated when sitting still. Denies taking any medications at home for pain. Denies fever, chills, cough, shortness of breath, palpitations, abdominal pain, nausea, vomiting.      Past Medical History:  Diagnosis Date  . Asthma   . Bipolar disorder (manic depression) (HCC)   . COPD (chronic obstructive pulmonary disease) (HCC)   . Coronary artery disease   . Hx of CABG   . Hypercholesteremia   . Hypertension   . Kidney stone   . Tobacco use disorder     Patient Active Problem List   Diagnosis Date  Noted  . MDD (major depressive disorder), recurrent severe, without psychosis (HCC) 06/18/2015  . Cocaine use disorder, moderate, dependence (HCC)   . Alcohol use disorder, moderate, dependence (HCC) 04/02/2015  . Alcohol abuse 03/08/2015  . Cocaine abuse 03/08/2015  . Substance induced mood disorder (HCC) 03/08/2015  . Suicidal ideation   . COPD (chronic obstructive pulmonary disease) (HCC) 02/09/2015  . Tobacco use disorder 02/09/2015  . Stimulant use disorder (cocaine) 02/09/2015  . Alcohol use disorder, severe, in sustained remission (HCC) 02/09/2015  . Ureteral stone 12/31/2014  . Claudication of left lower extremity (HCC) 08/14/2012  . CAD s/p CABG 2012 [facility] 11/28/2011  . Hyperlipidemia LDL goal <100 03/13/2007  . Essential hypertension 03/13/2007  . GERD 03/13/2007    Past Surgical History:  Procedure Laterality Date  . ABDOMINAL ANGIOGRAM  08/15/2012   Procedure: ABDOMINAL ANGIOGRAM;  Surgeon: Corky Crafts, MD;  Location: [facility] CATH LAB;  Service: Cardiovascular;;  . APPENDECTOMY    . CARDIOVERSION N/A 08/28/2012   Procedure: Pseudo Compression;  Surgeon: Chuck Hint, MD;  Location: [facility] CATH LAB;  Service: Cardiovascular;  Laterality: N/A;  . CORONARY ARTERY BYPASS GRAFT    . CYSTOSCOPY WITH RETROGRADE PYELOGRAM, URETEROSCOPY AND STENT PLACEMENT Right 12/31/2014   Procedure: CYSTOSCOPY  RIGHT URETEROSCOPY WITH STONE RETREIVAL RIGHT RETROGRADE PYELOGRAM;  Surgeon: Malen Gauze, MD;  Location: [facility] ORS;  Service: Urology;  Laterality: Right;  . KIDNEY STONE SURGERY    .  LOWER EXTREMITY ANGIOGRAM N/A 08/15/2012   Procedure: LOWER EXTREMITY ANGIOGRAM with possible PTA /stent;  Surgeon: Corky Crafts, MD;  Location: [facility] CATH LAB;  Service: Cardiovascular;  Laterality: N/A;       Home Medications    Prior to Admission medications   Medication Sig Start Date End Date Taking? Authorizing Provider  acetaminophen (TYLENOL) 500 MG tablet Take  1,000 mg by mouth every 6 (six) hours as needed for mild pain, moderate pain or headache.   Yes Historical Provider, MD  ARIPiprazole (ABILIFY) 5 MG tablet Take 1 tablet (5 mg total) by mouth daily. For mood control Patient not taking: Reported on 02/20/2016 06/28/15   Sanjuana Kava, NP  atorvastatin (LIPITOR) 20 MG tablet Take 1 tablet (20 mg total) by mouth daily at 6 PM. For high cholesterol Patient not taking: Reported on 02/20/2016 06/28/15   Sanjuana Kava, NP  benztropine (COGENTIN) 0.5 MG tablet Take 1 tablet (0.5 mg total) by mouth daily. For prevention of drug induced tremors Patient not taking: Reported on 02/20/2016 06/28/15   Sanjuana Kava, NP  busPIRone (BUSPAR) 7.5 MG tablet Take 1 tablet (7.5 mg total) by mouth 3 (three) times daily. For anxiety Patient not taking: Reported on 02/20/2016 06/28/15   Sanjuana Kava, NP  FLUoxetine (PROZAC) 40 MG capsule Take 1 capsule (40 mg total) by mouth daily. For depression Patient not taking: Reported on 02/20/2016 06/28/15   Sanjuana Kava, NP  hydrOXYzine (ATARAX/VISTARIL) 50 MG tablet Take 1 tablet (50 mg total) by mouth every 6 (six) hours as needed for anxiety. Patient not taking: Reported on 02/20/2016 06/28/15   Sanjuana Kava, NP  ibuprofen (ADVIL,MOTRIN) 800 MG tablet Take 1 tablet (800 mg total) by mouth 3 (three) times daily. For pain Patient not taking: Reported on 02/20/2016 06/28/15   Sanjuana Kava, NP  lidocaine (LIDODERM) 5 % Place 1 patch onto the skin daily. Remove & Discard patch within 12 hours or as directed by MD: For pain management Patient not taking: Reported on 02/20/2016 06/28/15   Sanjuana Kava, NP  metFORMIN (GLUCOPHAGE) 500 MG tablet Take 1 tablet (500 mg total) by mouth daily with breakfast. For diabetes management Patient not taking: Reported on 02/20/2016 06/28/15   Sanjuana Kava, NP  methocarbamol (ROBAXIN) 500 MG tablet Take 1 tablet (500 mg total) by mouth 2 (two) times daily. For muscle pain Patient not taking: Reported on  02/20/2016 06/28/15   Sanjuana Kava, NP  nicotine (NICODERM CQ - DOSED IN MG/24 HOURS) 21 mg/24hr patch Place 1 patch (21 mg total) onto the skin daily. For smoking cessation Patient not taking: Reported on 02/20/2016 06/28/15   Sanjuana Kava, NP  pantoprazole (PROTONIX) 40 MG tablet Take 1 tablet (40 mg total) by mouth 2 (two) times daily. For acid reflux Patient not taking: Reported on 02/20/2016 06/28/15   Sanjuana Kava, NP  QUEtiapine (SEROQUEL) 100 MG tablet Take 1 tablet (100 mg total) by mouth at bedtime. For mood control Patient not taking: Reported on 02/20/2016 06/28/15   Sanjuana Kava, NP  QUEtiapine (SEROQUEL) 25 MG tablet Take 1 tablet (25 mg total) by mouth 3 (three) times daily. For agitation Patient not taking: Reported on 02/20/2016 06/28/15   Sanjuana Kava, NP  traZODone (DESYREL) 100 MG tablet Take 1 tablet (100 mg total) by mouth at bedtime as needed for sleep. Patient not taking: Reported on 02/20/2016 06/28/15   Sanjuana Kava, NP  Family History Family History  Problem Relation Age of Onset  . Heart disease Father   . Hyperlipidemia Father   . Heart disease Maternal Grandmother     Social History Social History  Substance Use Topics  . Smoking status: Current Every Day Smoker    Packs/day: 1.00    Years: 31.00    Types: Cigarettes  . Smokeless tobacco: Former NeurosurgeonUser    Quit date: 04/27/2013  . Alcohol use Yes     Comment: last drink 03/07/2015      Allergies   Review of patient's allergies indicates no known allergies.   Review of Systems Review of Systems  Cardiovascular: Positive for chest pain.  Psychiatric/Behavioral: Positive for suicidal ideas.  All other systems reviewed and are negative.    Physical Exam Updated Vital Signs BP 132/86 (BP Location: Right Arm)   Pulse 116   Temp 97.9 F (36.6 C) (Oral)   Resp 18   SpO2 97%   Physical Exam  Constitutional: He is oriented to person, place, and time. He appears well-developed and well-nourished.  No distress.  HENT:  Head: Normocephalic and atraumatic.  Mouth/Throat: Oropharynx is clear and moist. No oropharyngeal exudate.  Eyes: Conjunctivae and EOM are normal. Right eye exhibits no discharge. Left eye exhibits no discharge. No scleral icterus.  Neck: Normal range of motion. Neck supple.  Cardiovascular: Normal rate, regular rhythm, normal heart sounds and intact distal pulses.   Pulmonary/Chest: Effort normal and breath sounds normal. No respiratory distress. He has no wheezes. He has no rales. He exhibits tenderness (right lateral chest wall TTP). He exhibits no laceration, no crepitus, no edema, no deformity, no swelling and no retraction.  Abdominal: Soft. Bowel sounds are normal. He exhibits no distension and no mass. There is no tenderness. There is no rebound and no guarding.  Musculoskeletal: Normal range of motion. He exhibits no edema.  Neurological: He is alert and oriented to person, place, and time.  Skin: Skin is warm and dry. He is not diaphoretic.  Psychiatric: His speech is slurred. He is withdrawn. He is not actively hallucinating. Thought content is not paranoid and not delusional. Cognition and memory are normal. He expresses inappropriate judgment. He exhibits a depressed mood. He expresses suicidal ideation. He expresses no homicidal ideation. He expresses suicidal plans. He expresses no homicidal plans.  Nursing note and vitals reviewed.    ED Treatments / Results  Labs (all labs ordered are listed, but only abnormal results are displayed) Labs Reviewed  COMPREHENSIVE METABOLIC PANEL - Abnormal; Notable for the following:       Result Value   Glucose, Bld 110 (*)    BUN 26 (*)    Creatinine, Ser 1.29 (*)    AST 43 (*)    All other components within normal limits  ACETAMINOPHEN LEVEL - Abnormal; Notable for the following:    Acetaminophen (Tylenol), Serum <10 (*)    All other components within normal limits  URINE RAPID DRUG SCREEN, HOSP PERFORMED -  Abnormal; Notable for the following:    Opiates POSITIVE (*)    Cocaine POSITIVE (*)    All other components within normal limits  ETHANOL  SALICYLATE LEVEL  CBC  I-STAT TROPOININ, ED    EKG  EKG Interpretation  Date/Time:  Sunday February 20 2016 19:55:44 EDT Ventricular Rate:  104 PR Interval:    QRS Duration: 90 QT Interval:  330 QTC Calculation: 434 R Axis:   -38 Text Interpretation:  Sinus tachycardia Left axis deviation  RSR' in V1 or V2, probably normal variant Nonspecific T abnormalities, lateral leads ST elev, probable normal early repol pattern Baseline wander in lead(s) V1 Confirmed by Rubin Payor  MD, NATHAN [phone]) on 02/20/2016 8:07:52 PM       Radiology Dg Ribs Unilateral W/chest Right  Result Date: 02/20/2016 CLINICAL DATA:  Right-sided chest pain. EXAM: RIGHT RIBS AND CHEST - 3+ VIEW COMPARISON:  02/18/2016 chest x-ray a FINDINGS: The cardiac silhouette, mediastinal and hilar contours are normal in stable. Stable surgical changes from bypass surgery. The lungs are clear. No pleural effusion. No pneumothorax. Dedicated views of the right ribs do not demonstrate any definite acute right-sided rib fractures. IMPRESSION: No acute cardiopulmonary findings and no definite acute right-sided rib fractures. Electronically Signed   By: Rudie Meyer M.D.   On: 02/20/2016 20:31    Procedures Procedures (including critical care time)  Medications Ordered in ED Medications - No data to display   Initial Impression / Assessment and Plan / ED Course  I have reviewed the triage vital signs and the nursing notes.  Pertinent labs & imaging results that were available during my care of the patient were reviewed by me and considered in my medical decision making (see chart for details).  Clinical Course   Patient presents with suicidal ideation and plan, denies attempt. He also reports having right-sided chest pain has been present for the past week which she notes feels like he  broke his rib. Denies any recent fall or injury. VSS. Exam revealed tenderness over right lateral chest wall, remaining exam unremarkable. EKG showed sinus tachycardia, heart rate 104, no acute ischemic changes noted. Troponin negative. Right rib/chest x-ray negative. Suspect patient's pain is likely due to musculoskeletal chest wall pain. I have a low suspicion for ACS, PE, dissection, or other acute cardiac event at this time. UDS positive for cocaine and opiates. Remaining labs unremarkable. Patient medically cleared. Consulted TTS.    Pending behavioral health recommendations.  Final Clinical Impressions(s) / ED Diagnoses   Final diagnoses:  Suicidal ideation  Right-sided chest wall pain    New Prescriptions New Prescriptions   No medications on file     Barrett Henle, PA-C 02/20/16 2054    Benjiman Core, MD 02/20/16 [phone]

## 2016-02-21 ENCOUNTER — Encounter (HOSPITAL_COMMUNITY): Payer: Self-pay | Admitting: *Deleted

## 2016-02-21 ENCOUNTER — Observation Stay (HOSPITAL_COMMUNITY)
Admission: AD | Admit: 2016-02-21 | Discharge: 2016-02-23 | Disposition: A | Discharge status: Home / Self Care | Payer: No Typology Code available for payment source | Source: Intra-hospital | Attending: Psychiatry | Admitting: Psychiatry

## 2016-02-21 DIAGNOSIS — Z79899 Other long term (current) drug therapy: Secondary | ICD-10-CM | POA: Diagnosis not present

## 2016-02-21 DIAGNOSIS — F1910 Other psychoactive substance abuse, uncomplicated: Secondary | ICD-10-CM | POA: Diagnosis present

## 2016-02-21 DIAGNOSIS — F1994 Other psychoactive substance use, unspecified with psychoactive substance-induced mood disorder: Secondary | ICD-10-CM | POA: Diagnosis present

## 2016-02-21 DIAGNOSIS — F1414 Cocaine abuse with cocaine-induced mood disorder: Secondary | ICD-10-CM | POA: Diagnosis not present

## 2016-02-21 DIAGNOSIS — F1021 Alcohol dependence, in remission: Secondary | ICD-10-CM | POA: Insufficient documentation

## 2016-02-21 DIAGNOSIS — F1124 Opioid dependence with opioid-induced mood disorder: Secondary | ICD-10-CM | POA: Insufficient documentation

## 2016-02-21 DIAGNOSIS — F1120 Opioid dependence, uncomplicated: Secondary | ICD-10-CM | POA: Diagnosis present

## 2016-02-21 DIAGNOSIS — R45851 Suicidal ideations: Principal | ICD-10-CM | POA: Insufficient documentation

## 2016-02-21 DIAGNOSIS — F17210 Nicotine dependence, cigarettes, uncomplicated: Secondary | ICD-10-CM | POA: Diagnosis not present

## 2016-02-21 DIAGNOSIS — Z8249 Family history of ischemic heart disease and other diseases of the circulatory system: Secondary | ICD-10-CM

## 2016-02-21 DIAGNOSIS — Z9114 Patient's other noncompliance with medication regimen: Secondary | ICD-10-CM | POA: Insufficient documentation

## 2016-02-21 DIAGNOSIS — I2510 Atherosclerotic heart disease of native coronary artery without angina pectoris: Secondary | ICD-10-CM | POA: Insufficient documentation

## 2016-02-21 DIAGNOSIS — F332 Major depressive disorder, recurrent severe without psychotic features: Secondary | ICD-10-CM | POA: Insufficient documentation

## 2016-02-21 DIAGNOSIS — Z951 Presence of aortocoronary bypass graft: Secondary | ICD-10-CM | POA: Insufficient documentation

## 2016-02-21 DIAGNOSIS — F1410 Cocaine abuse, uncomplicated: Secondary | ICD-10-CM | POA: Diagnosis present

## 2016-02-21 MED ORDER — METHOCARBAMOL 500 MG PO TABS: 500.0000 mg | ORAL_TABLET | Freq: Three times a day (TID) | ORAL | Status: DC | PRN

## 2016-02-21 MED ORDER — AZITHROMYCIN 250 MG PO TABS
250.0000 mg | ORAL_TABLET | Freq: Every day | ORAL | Status: DC
  Administered 2016-02-22 – 2016-02-23 (×2): 250 mg via ORAL
  Filled 2016-02-21 (×2): qty 1

## 2016-02-21 MED ORDER — CLONIDINE HCL 0.1 MG PO TABS
0.1000 mg | ORAL_TABLET | Freq: Four times a day (QID) | ORAL | Status: DC
  Administered 2016-02-21 – 2016-02-23 (×7): 0.1 mg via ORAL
  Filled 2016-02-21 (×7): qty 1

## 2016-02-21 MED ORDER — AZITHROMYCIN 250 MG PO TABS
500.0000 mg | ORAL_TABLET | Freq: Every day | ORAL | Status: AC
  Administered 2016-02-21: 500 mg via ORAL
  Filled 2016-02-21: qty 2

## 2016-02-21 MED ORDER — CHLORDIAZEPOXIDE HCL 25 MG PO CAPS
25.0000 mg | ORAL_CAPSULE | Freq: Four times a day (QID) | ORAL | Status: DC | PRN
  Administered 2016-02-22 – 2016-02-23 (×2): 25 mg via ORAL
  Filled 2016-02-21 (×2): qty 1

## 2016-02-21 MED ORDER — CLONIDINE HCL 0.1 MG PO TABS: 0.1000 mg | ORAL_TABLET | ORAL | Status: DC

## 2016-02-21 MED ORDER — NICOTINE 21 MG/24HR TD PT24
21.0000 mg | MEDICATED_PATCH | Freq: Every day | TRANSDERMAL | Status: DC
  Administered 2016-02-21 – 2016-02-23 (×3): 21 mg via TRANSDERMAL
  Filled 2016-02-21 (×3): qty 1

## 2016-02-21 MED ORDER — ALUM & MAG HYDROXIDE-SIMETH 200-200-20 MG/5ML PO SUSP: 30.0000 mL | ORAL | Status: DC | PRN

## 2016-02-21 MED ORDER — LOPERAMIDE HCL 2 MG PO CAPS: 2.0000 mg | ORAL_CAPSULE | ORAL | Status: DC | PRN

## 2016-02-21 MED ORDER — MAGNESIUM HYDROXIDE 400 MG/5ML PO SUSP: 30.0000 mL | Freq: Every day | ORAL | Status: DC | PRN

## 2016-02-21 MED ORDER — FLUOXETINE HCL 20 MG PO CAPS
20.0000 mg | ORAL_CAPSULE | Freq: Every day | ORAL | Status: DC
  Administered 2016-02-21 – 2016-02-23 (×3): 20 mg via ORAL
  Filled 2016-02-21 (×3): qty 1

## 2016-02-21 MED ORDER — NAPROXEN 500 MG PO TABS
500.0000 mg | ORAL_TABLET | Freq: Two times a day (BID) | ORAL | Status: DC | PRN
  Administered 2016-02-21 – 2016-02-23 (×2): 500 mg via ORAL
  Filled 2016-02-21 (×2): qty 1

## 2016-02-21 MED ORDER — HYDROXYZINE HCL 25 MG PO TABS
25.0000 mg | ORAL_TABLET | Freq: Four times a day (QID) | ORAL | Status: DC | PRN
  Administered 2016-02-21 – 2016-02-22 (×4): 25 mg via ORAL
  Filled 2016-02-21 (×4): qty 1

## 2016-02-21 MED ORDER — DICYCLOMINE HCL 20 MG PO TABS: 20.0000 mg | ORAL_TABLET | Freq: Four times a day (QID) | ORAL | Status: DC | PRN

## 2016-02-21 MED ORDER — CLONIDINE HCL 0.1 MG PO TABS: 0.1000 mg | ORAL_TABLET | Freq: Every day | ORAL | Status: DC

## 2016-02-21 MED ORDER — ARIPIPRAZOLE 2 MG PO TABS
2.0000 mg | ORAL_TABLET | Freq: Every day | ORAL | Status: DC
  Administered 2016-02-21 – 2016-02-22 (×2): 2 mg via ORAL
  Filled 2016-02-21 (×2): qty 1

## 2016-02-21 MED ORDER — ACETAMINOPHEN 325 MG PO TABS
650.0000 mg | ORAL_TABLET | Freq: Four times a day (QID) | ORAL | Status: DC | PRN
  Administered 2016-02-23: 650 mg via ORAL
  Filled 2016-02-21 (×2): qty 2

## 2016-02-21 MED ORDER — ONDANSETRON 4 MG PO TBDP: 4.0000 mg | ORAL_TABLET | Freq: Four times a day (QID) | ORAL | Status: DC | PRN

## 2016-02-21 NOTE — Consult Note (Addendum)
[facility] Consult   Reason for Consult:  Cocaine abuse with suicidal ideations Referring Physician:  EDP Patient Identification: Steven [name] MRN:  [id number] Principal Diagnosis: Cocaine abuse with cocaine-induced mood disorder [facility]) Diagnosis:   Patient Active Problem List   Diagnosis Date Noted  . Cocaine abuse with cocaine-induced mood disorder ([place]) [F14.14] 02/21/2016    Priority: High  . MDD (major depressive disorder), recurrent severe, without psychosis ([place]) [F33.2] 06/18/2015  . Substance induced mood disorder ([place]) [F19.94] 03/08/2015  . Suicidal ideation [R45.851]   . COPD (chronic obstructive pulmonary disease) ([place]) [J44.9] 02/09/2015  . Tobacco use disorder [F17.200] 02/09/2015  . Stimulant use disorder (cocaine) [F15.90] 02/09/2015  . Alcohol use disorder, severe, in sustained remission ([place]) [F10.21] 02/09/2015  . Ureteral stone [N20.1] 12/31/2014  . Claudication of left lower extremity ([place]) [I73.9] 08/14/2012  . CAD s/p CABG 2012 [facility] [I25.810] 11/28/2011  . Hyperlipidemia LDL goal <100 [E78.5] 03/13/2007  . Essential hypertension [I10] 03/13/2007  . GERD [K21.9] 03/13/2007    Total Time spent with patient: 45 minutes  Subjective:   Steven [name] is a 45 y.o. male patient does not warrant admission.  HPI:  45 yo male who presented to the ED after abusing cocaine and having suicidal ideations.  Today, he is irritable but no suicidal/homicidal ideations, hallucinations, or withdrawal symptoms.  He was offered [facility] Observation Unit but told the counselor to "go fuck himself", he wanted longer than " 3 days."  After talking to him, he is agreeable to go to Obs  Past Psychiatric History: bipolar disorder, substance abuse  Risk to Self: Suicidal Ideation: Yes-Currently Present Suicidal Intent: Yes-Currently Present Is patient at risk for suicide?: Yes Suicidal Plan?: Yes-Currently Present Specify Current Suicidal Plan: Overdose on  heroin Access to Means: Yes Specify Access to Suicidal Means: could get it off the street What has been your use of drugs/alcohol within the last 12 months?: ETOH & cocaine How many times?: 1 Other Self Harm Risks: none Triggers for Past Attempts: Unpredictable Intentional Self Injurious Behavior: None Risk to Others: Homicidal Ideation: No Thoughts of Harm to Others: No Current Homicidal Intent: No Current Homicidal Plan: No Access to Homicidal Means: No Identified Victim: No one History of harm to others?: Yes Assessment of Violence: In distant past Violent Behavior Description: No recent fights Does patient have access to weapons?: No Criminal Charges Pending?: No Does patient have a court date: No Prior Inpatient Therapy: Prior Inpatient Therapy: Yes Prior Therapy Dates: 06/2015; 12/16; 11/16; 10/16 Prior Therapy Facilty/Provider(s): [facility] Reason for Treatment: SI, SA Prior Outpatient Therapy: Prior Outpatient Therapy: No Prior Therapy Dates: None Prior Therapy Facilty/Provider(s): None Reason for Treatment: None Does patient have an ACCT team?: No Does patient have Intensive In-House Services?  : No Does patient have [facility] services? : No Does patient have P4CC services?: No  Past Medical History:  Past Medical History:  Diagnosis Date  . Asthma   . Bipolar disorder (manic depression) ([place])   . COPD (chronic obstructive pulmonary disease) ([place])   . Coronary artery disease   . Hx of CABG   . Hypercholesteremia   . Hypertension   . Kidney stone   . Tobacco use disorder     Past Surgical History:  Procedure Laterality Date  . ABDOMINAL ANGIOGRAM  08/15/2012   Procedure: ABDOMINAL ANGIOGRAM;  Surgeon: Jettie Booze, MD;  Location: [facility] CATH LAB;  Service: Cardiovascular;;  . APPENDECTOMY    . CARDIOVERSION N/A 08/28/2012  Procedure: Pseudo Compression;  Surgeon: Angelia Mould, MD;  Location: [facility] CATH LAB;  Service: Cardiovascular;  Laterality: N/A;  .  CORONARY ARTERY BYPASS GRAFT    . CYSTOSCOPY WITH RETROGRADE PYELOGRAM, URETEROSCOPY AND STENT PLACEMENT Right 12/31/2014   Procedure: CYSTOSCOPY  RIGHT URETEROSCOPY WITH STONE RETREIVAL RIGHT RETROGRADE PYELOGRAM;  Surgeon: Cleon Gustin, MD;  Location: [facility] ORS;  Service: Urology;  Laterality: Right;  . KIDNEY STONE SURGERY    . LOWER EXTREMITY ANGIOGRAM N/A 08/15/2012   Procedure: LOWER EXTREMITY ANGIOGRAM with possible PTA /stent;  Surgeon: Jettie Booze, MD;  Location: [facility] CATH LAB;  Service: Cardiovascular;  Laterality: N/A;   Family History:  Family History  Problem Relation Age of Onset  . Heart disease Father   . Hyperlipidemia Father   . Heart disease Maternal Grandmother    Family Psychiatric  History: none Social History:  History  Alcohol Use  . Yes    Comment: last drink 03/07/2015      History  Drug Use  . Types: Cocaine, Marijuana    Comment: 10/23//2016    Social History   Social History  . Marital status: Divorced    Spouse name: N/A  . Number of children: N/A  . Years of education: N/A   Social History Main Topics  . Smoking status: Current Every Day Smoker    Packs/day: 1.00    Years: 31.00    Types: Cigarettes  . Smokeless tobacco: Former Systems developer    Quit date: 04/27/2013  . Alcohol use Yes     Comment: last drink 03/07/2015   . Drug use:     Types: Cocaine, Marijuana     Comment: 10/23//2016  . Sexual activity: Yes    Birth control/ protection: Condom   Other Topics Concern  . Not on file   Social History Narrative  . No narrative on file   Additional Social History:    Allergies:  No Known Allergies  Labs:  Results for orders placed or performed during the hospital encounter of 02/20/16 (from the past 48 hour(s))  Rapid urine drug screen (hospital performed)     Status: Abnormal   Collection Time: 02/20/16  7:04 PM  Result Value Ref Range   Opiates POSITIVE (A) NONE DETECTED   Cocaine POSITIVE (A) NONE DETECTED    Benzodiazepines NONE DETECTED NONE DETECTED   Amphetamines NONE DETECTED NONE DETECTED   Tetrahydrocannabinol NONE DETECTED NONE DETECTED   Barbiturates NONE DETECTED NONE DETECTED    Comment:        DRUG SCREEN FOR MEDICAL PURPOSES ONLY.  IF CONFIRMATION IS NEEDED FOR ANY PURPOSE, NOTIFY LAB WITHIN 5 DAYS.        LOWEST DETECTABLE LIMITS FOR URINE DRUG SCREEN Drug Class       Cutoff (ng/mL) Amphetamine      1000 Barbiturate      200 Benzodiazepine   099 Tricyclics       833 Opiates          300 Cocaine          300 THC              50   Comprehensive metabolic panel     Status: Abnormal   Collection Time: 02/20/16  7:27 PM  Result Value Ref Range   Sodium 138 135 - 145 mmol/L   Potassium 4.1 3.5 - 5.1 mmol/L   Chloride 106 101 - 111 mmol/L   CO2 23 22 - 32 mmol/L   Glucose,  Bld 110 (H) 65 - 99 mg/dL   BUN 26 (H) 6 - 20 mg/dL   Creatinine, Ser 9.80 (H) 0.61 - 1.24 mg/dL   Calcium 9.1 8.9 - 71.0 mg/dL   Total Protein 7.9 6.5 - 8.1 g/dL   Albumin 4.5 3.5 - 5.0 g/dL   AST 43 (H) 15 - 41 U/L   ALT 52 17 - 63 U/L   Alkaline Phosphatase 74 38 - 126 U/L   Total Bilirubin 0.6 0.3 - 1.2 mg/dL   GFR calc non Af Amer >60 >60 mL/min   GFR calc Af Amer >60 >60 mL/min    Comment: (NOTE) The eGFR has been calculated using the CKD EPI equation. This calculation has not been validated in all clinical situations. eGFR's persistently <60 mL/min signify possible Chronic Kidney Disease.    Anion gap 9 5 - 15  Ethanol     Status: None   Collection Time: 02/20/16  7:27 PM  Result Value Ref Range   Alcohol, Ethyl (B) <5 <5 mg/dL    Comment:        LOWEST DETECTABLE LIMIT FOR SERUM ALCOHOL IS 5 mg/dL FOR MEDICAL PURPOSES ONLY   Salicylate level     Status: None   Collection Time: 02/20/16  7:27 PM  Result Value Ref Range   Salicylate Lvl <7.0 2.8 - 30.0 mg/dL  Acetaminophen level     Status: Abnormal   Collection Time: 02/20/16  7:27 PM  Result Value Ref Range   Acetaminophen  (Tylenol), Serum <10 (L) 10 - 30 ug/mL    Comment:        THERAPEUTIC CONCENTRATIONS VARY SIGNIFICANTLY. A RANGE OF 10-30 ug/mL MAY BE AN EFFECTIVE CONCENTRATION FOR MANY PATIENTS. HOWEVER, SOME ARE BEST TREATED AT CONCENTRATIONS OUTSIDE THIS RANGE. ACETAMINOPHEN CONCENTRATIONS >150 ug/mL AT 4 HOURS AFTER INGESTION AND >50 ug/mL AT 12 HOURS AFTER INGESTION ARE OFTEN ASSOCIATED WITH TOXIC REACTIONS.   cbc     Status: None   Collection Time: 02/20/16  7:27 PM  Result Value Ref Range   WBC 9.4 4.0 - 10.5 K/uL   RBC 4.72 4.22 - 5.81 MIL/uL   Hemoglobin 15.4 13.0 - 17.0 g/dL   HCT 53.8 25.5 - 14.8 %   MCV 95.6 78.0 - 100.0 fL   MCH 32.6 26.0 - 34.0 pg   MCHC 34.1 30.0 - 36.0 g/dL   RDW 63.7 31.5 - 35.5 %   Platelets 281 150 - 400 K/uL  I-stat troponin, ED     Status: None   Collection Time: 02/20/16  7:49 PM  Result Value Ref Range   Troponin i, poc 0.01 0.00 - 0.08 ng/mL   Comment 3            Comment: Due to the release kinetics of cTnI, a negative result within the first hours of the onset of symptoms does not rule out myocardial infarction with certainty. If myocardial infarction is still suspected, repeat the test at appropriate intervals.     No current facility-administered medications for this encounter.    Current Outpatient Prescriptions  Medication Sig Dispense Refill  . acetaminophen (TYLENOL) 500 MG tablet Take 1,000 mg by mouth every 6 (six) hours as needed for mild pain, moderate pain or headache.    . ARIPiprazole (ABILIFY) 5 MG tablet Take 1 tablet (5 mg total) by mouth daily. For mood control (Patient not taking: Reported on 02/20/2016) 30 tablet 0  . atorvastatin (LIPITOR) 20 MG tablet Take 1 tablet (20 mg total)  by mouth daily at 6 PM. For high cholesterol (Patient not taking: Reported on 02/20/2016) 30 tablet 0  . benztropine (COGENTIN) 0.5 MG tablet Take 1 tablet (0.5 mg total) by mouth daily. For prevention of drug induced tremors (Patient not taking:  Reported on 02/20/2016) 30 tablet 0  . busPIRone (BUSPAR) 7.5 MG tablet Take 1 tablet (7.5 mg total) by mouth 3 (three) times daily. For anxiety (Patient not taking: Reported on 02/20/2016) 90 tablet 0  . FLUoxetine (PROZAC) 40 MG capsule Take 1 capsule (40 mg total) by mouth daily. For depression (Patient not taking: Reported on 02/20/2016) 30 capsule 0  . hydrOXYzine (ATARAX/VISTARIL) 50 MG tablet Take 1 tablet (50 mg total) by mouth every 6 (six) hours as needed for anxiety. (Patient not taking: Reported on 02/20/2016) 60 tablet 0  . ibuprofen (ADVIL,MOTRIN) 800 MG tablet Take 1 tablet (800 mg total) by mouth 3 (three) times daily. For pain (Patient not taking: Reported on 02/20/2016) 1 tablet 0  . lidocaine (LIDODERM) 5 % Place 1 patch onto the skin daily. Remove & Discard patch within 12 hours or as directed by MD: For pain management (Patient not taking: Reported on 02/20/2016) 14 patch 0  . metFORMIN (GLUCOPHAGE) 500 MG tablet Take 1 tablet (500 mg total) by mouth daily with breakfast. For diabetes management (Patient not taking: Reported on 02/20/2016) 30 tablet 0  . methocarbamol (ROBAXIN) 500 MG tablet Take 1 tablet (500 mg total) by mouth 2 (two) times daily. For muscle pain (Patient not taking: Reported on 02/20/2016) 30 tablet 0  . nicotine (NICODERM CQ - DOSED IN MG/24 HOURS) 21 mg/24hr patch Place 1 patch (21 mg total) onto the skin daily. For smoking cessation (Patient not taking: Reported on 02/20/2016) 28 patch 0  . pantoprazole (PROTONIX) 40 MG tablet Take 1 tablet (40 mg total) by mouth 2 (two) times daily. For acid reflux (Patient not taking: Reported on 02/20/2016) 60 tablet 0  . QUEtiapine (SEROQUEL) 100 MG tablet Take 1 tablet (100 mg total) by mouth at bedtime. For mood control (Patient not taking: Reported on 02/20/2016) 30 tablet 0  . QUEtiapine (SEROQUEL) 25 MG tablet Take 1 tablet (25 mg total) by mouth 3 (three) times daily. For agitation (Patient not taking: Reported on 02/20/2016) 90  tablet 0  . traZODone (DESYREL) 100 MG tablet Take 1 tablet (100 mg total) by mouth at bedtime as needed for sleep. (Patient not taking: Reported on 02/20/2016) 30 tablet 0    Musculoskeletal: Strength & Muscle Tone: within normal limits Gait & Station: normal Patient leans: N/A  Psychiatric Specialty Exam: Physical Exam  Constitutional: He is oriented to person, place, and time. He appears well-developed and well-nourished.  HENT:  Head: Normocephalic.  Neck: Normal range of motion.  Respiratory: Effort normal.  Musculoskeletal: Normal range of motion.  Neurological: He is alert and oriented to person, place, and time.  Skin: Skin is warm and dry.  Psychiatric: His speech is normal and behavior is normal. Judgment and thought content normal. Cognition and memory are normal. He exhibits a depressed mood.    Review of Systems  Constitutional: Negative.   HENT: Negative.   Eyes: Negative.   Respiratory: Negative.   Cardiovascular: Negative.   Gastrointestinal: Negative.   Genitourinary: Negative.   Musculoskeletal: Negative.   Skin: Negative.   Neurological: Negative.   Endo/Heme/Allergies: Negative.   Psychiatric/Behavioral: Positive for depression and substance abuse.    Blood pressure 155/96, pulse 83, temperature 97.6 F (36.4 C), temperature source  Oral, resp. rate 18, SpO2 100 %.There is no height or weight on file to calculate BMI.  General Appearance: Casual  Eye Contact:  Good  Speech:  Normal Rate  Volume:  Normal  Mood:  Depressed, mild  Affect:  Congruent  Thought Process:  Coherent and Descriptions of Associations: Intact  Orientation:  Full (Time, Place, and Person)  Thought Content:  WDL  Suicidal Thoughts:  No  Homicidal Thoughts:  No  Memory:  Immediate;   Good Recent;   Good Remote;   Good  Judgement:  Fair  Insight:  Fair  Psychomotor Activity:  Normal  Concentration:  Concentration: Good and Attention Span: Good  Recall:  Good  Fund of  Knowledge:  Good  Language:  Good  Akathisia:  No  Handed:  Right  AIMS (if indicated):     Assets:  Housing Leisure Time Physical Health Resilience  ADL's:  Intact  Cognition:  WNL  Sleep:        Treatment Plan Summary: Daily contact with patient to assess and evaluate symptoms and progress in treatment, Medication management and Plan cocaine abuse with cocaine induced mood disorder:  -Crisis stabilization -Medication management:  Medications not restarted as patient is leaving due to refusing to go to Obs -Individual and substance abuse counseling  Disposition:  Transfer to [facility] Obs  Waylan Boga, NP 02/21/2016 11:16 AM  Patient seen face-to-face for psychiatric evaluation, chart reviewed and case discussed with the physician extender and developed treatment plan. Reviewed the information documented and agree with the treatment plan. Corena Pilgrim, MD

## 2016-02-21 NOTE — BH Assessment (Addendum)
[facility] Assessment Progress Note  Per Thedore MinsMojeed Akintayo, MD, this pt does not require psychiatric hospitalization at this time, but he would benefit [facility].  This option was offered to the pt, but he declined it.  [facility].  Discharge instructions advise pt to follow up with [facility], [facility], [facility], or [facility].  Pt's nurse, Kendal Hymendie, has been notified.  Doylene Canninghomas Burkley Dech, MA Triage Specialist [phone]   Addendum:  After further consideration, pt opted to accept admission to the Observation Unit.  Berneice Heinrichina Tate, RN, [facility], has [facility] pt to Obs [facility].  Pt has signed Voluntary Admission and Consent for Treatment, as well as Consent to Release Information to no one, and signed forms [facility].  Pt's nurse, Kendal Hymendie, has been notified, and agrees to send original paperwork along with pt via [facility], and to call report to [phone] or [phone].  Doylene Canninghomas Johngabriel Verde, MA Triage Specialist [phone]
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
## 2016-02-21 NOTE — Plan of Care (Signed)
[facility] Observation Crisis Plan  Reason for Crisis Plan:  Substance Abuse   Plan of Care:  Referral for Substance Abuse  Family Support:      Current Living Environment:  Living Arrangements: Alone  Insurance:   Hospital Account    Name Acct ID Class Status Primary Coverage   Steven [name], Steven [name] [id number] BEHAVIORAL HEALTH OBSERVATION Open None        Guarantor Account (for Hospital Account [id number])    Name Relation to Pt Service Area Active? Acct Type   Steven [name], Steven [name] Self [facility]   Address Phone       [address] [name], [name]          Coverage Information (for Hospital Account [id number])    Not on file      Legal Guardian:     Primary Care Provider:  Dartha LodgeAnthony Steele, [name]  Current Outpatient Providers:  none  Psychiatrist:     Counselor/Therapist:     Compliant with Medications:  No  Additional Information:   Steven [name], Steven [name] 10/9/20173:10 PM

## 2016-02-21 NOTE — ED Notes (Signed)
Patient noted sleeping in room. No complaints, stable, in no acute distress. Q15 minute rounds and monitoring via Security Cameras to continue.  

## 2016-02-21 NOTE — H&P (Signed)
[place] Admission Assessment Adult  Patient Identification: Steven [name]  MRN:  [id number]  Date of Evaluation:  02/21/2016  Chief Complaint:  Patient states "I am suicidal as can be. I am just tired of life."  Principal Diagnosis: MDD (major depressive disorder), recurrent severe, without psychosis ([place])  Diagnosis:   Patient Active Problem List   Diagnosis Date Noted  . Cocaine abuse with cocaine-induced mood disorder ([place]) [F14.14] 02/21/2016  . MDD (major depressive disorder), recurrent severe, without psychosis ([place]) [F33.2] 06/18/2015  . Substance induced mood disorder ([place]) [F19.94] 03/08/2015  . Suicidal ideation [R45.851]   . COPD (chronic obstructive pulmonary disease) ([place]) [J44.9] 02/09/2015  . Tobacco use disorder [F17.200] 02/09/2015  . Stimulant use disorder (cocaine) [F15.90] 02/09/2015  . Alcohol use disorder, severe, in sustained remission ([place]) [F10.21] 02/09/2015  . Ureteral stone [N20.1] 12/31/2014  . Claudication of left lower extremity ([place]) [I73.9] 08/14/2012  . CAD s/p CABG 2012 [facility] [I25.810] 11/28/2011  . Hyperlipidemia LDL goal <100 [E78.5] 03/13/2007  . Essential hypertension [I10] 03/13/2007  . GERD [K21.9] 03/13/2007   History of Present Illness:   Steven [name] is a 45 year old male who presented to [place] reporting depressive symptoms with active suicidal ideation.  Patient reports he has been feeling suicidal for the past month but notes it has worsened over the past few days. He reports "I no longer want to deal with this anymore. If you guys let me out tomorrow I will go get some heroin to overdose on." Of note patient has reported this plan during past admissions but does not have history of following through.  Patient reports having SI with multiple plans. He states he has thought about overdosing on heroin or crashing on his motorcycle. Denies HI or auditory/visual hallucinations. Patient reports drinking one beer and  snorting two lines of cocaine three days ago. Denies any other recent alcohol or drug use. Patient denies taking any medications at home. He report not taking his psychiatric medication for several months. Patient is endorsing withdrawal symptoms such as irritable mood and sweating. The patient is requesting to be restarted on prozac and abilify as they were helpful in the past. According to his last discharge summary the patient was on this combination when discharged from [facility] in February of 2017. The patient's current urine drug screen is positive for opiates and cocaine with negative alcohol level.   Associated Signs/Symptoms: Depression Symptoms:  depressed mood, anhedonia, insomnia, fatigue, feelings of worthlessness/guilt, hopelessness, suicidal thoughts with specific plan, anxiety,  (Hypo) Manic Symptoms:  Denies any hypomanic symptoms  Anxiety Symptoms:  Excessive Worry,  Psychotic Symptoms:  Denies any hallucinations  PTSD Symptoms: None reported  Total Time spent with patient: 45 minutes  Past Psychiatric History: Bipolar 1 disorder by history, diagnosis per last discharge summary MDD on 06/28/2015   Risk to Self: Is patient at risk for suicide?: Yes Risk to Others: No Prior Inpatient Therapy: Yes Prior Outpatient Therapy: Yes  Alcohol Screening: 1. How often do you have a drink containing alcohol?: 2 to 3 times a week 2. How many drinks containing alcohol do you have on a typical day when you are drinking?: 3 or 4 3. How often do you have six or more drinks on one occasion?: Monthly Preliminary Score: 3 4. How often during the last year have you found that you were not able to stop drinking once you had started?: Never 5. How often during the last year have you failed to do  what was normally expected from you becasue of drinking?: Never 6. How often during the last year have you needed a first drink in the morning to get yourself going after a heavy drinking session?:  Never 7. How often during the last year have you had a feeling of guilt of remorse after drinking?: Never 8. How often during the last year have you been unable to remember what happened the night before because you had been drinking?: Never 9. Have you or someone else been injured as a result of your drinking?: No 10. Has a relative or friend or a doctor or another health worker been concerned about your drinking or suggested you cut down?: Yes, during the last year Alcohol Use Disorder Identification Test Final Score (AUDIT): 10 Brief Intervention: Yes  Substance Abuse History in the last 12 months:  Yes.   His urine drug screen is positive for opiates and cocaine. Reports occasional alcohol use over the past few months.   Consequences of Substance Abuse: Medical Consequences:  Liver damage, Possible death by overdose Legal Consequences:  Arrests, jail time, Loss of driving privilege. Family Consequences:  Family discord, divorce and or separation.  Previous Psychotropic Medications: Yes Seroquel, Abilify, Prozac, Buspar,   Psychological Evaluations: No   Past Medical History:  Past Medical History:  Diagnosis Date  . Asthma   . Bipolar disorder (manic depression) ([place])   . COPD (chronic obstructive pulmonary disease) ([place])   . Coronary artery disease   . Hx of CABG   . Hypercholesteremia   . Hypertension   . Kidney stone   . Tobacco use disorder     Past Surgical History:  Procedure Laterality Date  . ABDOMINAL ANGIOGRAM  08/15/2012   Procedure: ABDOMINAL ANGIOGRAM;  Surgeon: Jettie Booze, MD;  Location: [facility] C[facility] LAB;  Service: Cardiovascular;;  . APPENDECTOMY    . CARDIOVERSION N/A 08/28/2012   Procedure: Pseudo Compression;  Surgeon: Angelia Mould, MD;  Location: [facility] CATH LAB;  Service: Cardiovascular;  Laterality: N/A;  . CORONARY ARTERY BYPASS GRAFT    . CYSTOSCOPY WITH RETROGRADE PYELOGRAM, URETEROSCOPY AND STENT PLACEMENT Right 12/31/2014   Procedure:  CYSTOSCOPY  RIGHT URETEROSCOPY WITH STONE RETREIVAL RIGHT RETROGRADE PYELOGRAM;  Surgeon: Cleon Gustin, MD;  Location: [facility] ORS;  Service: Urology;  Laterality: Right;  . KIDNEY STONE SURGERY    . LOWER EXTREMITY ANGIOGRAM N/A 08/15/2012   Procedure: LOWER EXTREMITY ANGIOGRAM with possible PTA /stent;  Surgeon: Jettie Booze, MD;  Location[facility] CATH LAB;  Service: Cardiovascular;  Laterality: N/A;   Family History:  Family History  Problem Relation Age of Onset  . Heart disease Father   . Hyperlipidemia Father   . Heart disease Maternal Grandmother    Family Psychiatric  History: Denies  Social History:  History  Alcohol Use  . Yes    Comment: last drink 03/07/2015      History  Drug Use  . Types: Cocaine, Marijuana    Comment: 10/23//2016    Social History   Social History  . Marital status: Divorced    Spouse name: N/A  . Number of children: N/A  . Years of education: N/A   Social History Main Topics  . Smoking status: Current Every Day Smoker    Packs/day: 1.00    Years: 31.00    Types: Cigarettes  . Smokeless tobacco: Former Systems developer    Quit date: 04/27/2013  . Alcohol use Yes     Comment: last drink 03/07/2015   .  Drug use:     Types: Cocaine, Marijuana     Comment: 10/23//2016  . Sexual activity: Yes    Birth control/ protection: Condom   Other Topics Concern  . None   Social History Narrative  . None   Additional Social History:    Allergies:  No Known Allergies  Lab Results:  Results for orders placed or performed during the hospital encounter of 02/20/16 (from the past 48 hour(s))  Rapid urine drug screen (hospital performed)     Status: Abnormal   Collection Time: 02/20/16  7:04 PM  Result Value Ref Range   Opiates POSITIVE (A) NONE DETECTED   Cocaine POSITIVE (A) NONE DETECTED   Benzodiazepines NONE DETECTED NONE DETECTED   Amphetamines NONE DETECTED NONE DETECTED   Tetrahydrocannabinol NONE DETECTED NONE DETECTED   Barbiturates NONE  DETECTED NONE DETECTED    Comment:        DRUG SCREEN FOR MEDICAL PURPOSES ONLY.  IF CONFIRMATION IS NEEDED FOR ANY PURPOSE, NOTIFY LAB WITHIN 5 DAYS.        LOWEST DETECTABLE LIMITS FOR URINE DRUG SCREEN Drug Class       Cutoff (ng/mL) Amphetamine      1000 Barbiturate      200 Benzodiazepine   025 Tricyclics       852 Opiates          300 Cocaine          300 THC              50   Comprehensive metabolic panel     Status: Abnormal   Collection Time: 02/20/16  7:27 PM  Result Value Ref Range   Sodium 138 135 - 145 mmol/L   Potassium 4.1 3.5 - 5.1 mmol/L   Chloride 106 101 - 111 mmol/L   CO2 23 22 - 32 mmol/L   Glucose, Bld 110 (H) 65 - 99 mg/dL   BUN 26 (H) 6 - 20 mg/dL   Creatinine, Ser 1.29 (H) 0.61 - 1.24 mg/dL   Calcium 9.1 8.9 - 10.3 mg/dL   Total Protein 7.9 6.5 - 8.1 g/dL   Albumin 4.5 3.5 - 5.0 g/dL   AST 43 (H) 15 - 41 U/L   ALT 52 17 - 63 U/L   Alkaline Phosphatase 74 38 - 126 U/L   Total Bilirubin 0.6 0.3 - 1.2 mg/dL   GFR calc non Af Amer >60 >60 mL/min   GFR calc Af Amer >60 >60 mL/min    Comment: (NOTE) The eGFR has been calculated using the CKD EPI equation. This calculation has not been validated in all clinical situations. eGFR's persistently <60 mL/min signify possible Chronic Kidney Disease.    Anion gap 9 5 - 15  Ethanol     Status: None   Collection Time: 02/20/16  7:27 PM  Result Value Ref Range   Alcohol, Ethyl (B) <5 <5 mg/dL    Comment:        LOWEST DETECTABLE LIMIT FOR SERUM ALCOHOL IS 5 mg/dL FOR MEDICAL PURPOSES ONLY   Salicylate level     Status: None   Collection Time: 02/20/16  7:27 PM  Result Value Ref Range   Salicylate Lvl <7.7 2.8 - 30.0 mg/dL  Acetaminophen level     Status: Abnormal   Collection Time: 02/20/16  7:27 PM  Result Value Ref Range   Acetaminophen (Tylenol), Serum <10 (L) 10 - 30 ug/mL    Comment:        THERAPEUTIC  CONCENTRATIONS VARY SIGNIFICANTLY. A RANGE OF 10-30 ug/mL MAY BE AN  EFFECTIVE CONCENTRATION FOR MANY PATIENTS. HOWEVER, SOME ARE BEST TREATED AT CONCENTRATIONS OUTSIDE THIS RANGE. ACETAMINOPHEN CONCENTRATIONS >150 ug/mL AT 4 HOURS AFTER INGESTION AND >50 ug/mL AT 12 HOURS AFTER INGESTION ARE OFTEN ASSOCIATED WITH TOXIC REACTIONS.   cbc     Status: None   Collection Time: 02/20/16  7:27 PM  Result Value Ref Range   WBC 9.4 4.0 - 10.5 K/uL   RBC 4.72 4.22 - 5.81 MIL/uL   Hemoglobin 15.4 13.0 - 17.0 g/dL   HCT 45.1 39.0 - 52.0 %   MCV 95.6 78.0 - 100.0 fL   MCH 32.6 26.0 - 34.0 pg   MCHC 34.1 30.0 - 36.0 g/dL   RDW 14.9 11.5 - 15.5 %   Platelets 281 150 - 400 K/uL  I-stat troponin, ED     Status: None   Collection Time: 02/20/16  7:49 PM  Result Value Ref Range   Troponin i, poc 0.01 0.00 - 0.08 ng/mL   Comment 3            Comment: Due to the release kinetics of cTnI, a negative result within the first hours of the onset of symptoms does not rule out myocardial infarction with certainty. If myocardial infarction is still suspected, repeat the test at appropriate intervals.    Metabolic Disorder Labs:  Lab Results  Component Value Date   HGBA1C 6.5 (H) 04/06/2015   MPG 140 04/06/2015   MPG 97 04/27/2013   Lab Results  Component Value Date   PROLACTIN 18.2 (H) 04/06/2015   Lab Results  Component Value Date   CHOL 284 (H) 04/06/2015   TRIG 378 (H) 04/06/2015   HDL 43 04/06/2015   CHOLHDL 6.6 04/06/2015   VLDL 76 (H) 04/06/2015   LDLCALC 165 (H) 04/06/2015   LDLCALC 114 (H) 02/10/2015   Current Medications: Current Facility-Administered Medications  Medication Dose Route Frequency Provider Last Rate Last Dose  . acetaminophen (TYLENOL) tablet 650 mg  650 mg Oral Q6H PRN Patrecia Pour, NP      . alum & mag hydroxide-simeth (MAALOX/MYLANTA) 200-200-20 MG/5ML suspension 30 mL  30 mL Oral Q4H PRN Patrecia Pour, NP      . ARIPiprazole (ABILIFY) tablet 2 mg  2 mg Oral Daily Niel Hummer, NP   2 mg at 02/21/16 1537  . [START ON  02/22/2016] azithromycin (ZITHROMAX) tablet 250 mg  250 mg Oral Daily Niel Hummer, NP      . chlordiazePOXIDE (LIBRIUM) capsule 25 mg  25 mg Oral Q6H PRN Niel Hummer, NP      . cloNIDine (CATAPRES) tablet 0.1 mg  0.1 mg Oral QID Niel Hummer, NP   0.1 mg at 02/21/16 1537   Followed by  . [START ON 02/24/2016] cloNIDine (CATAPRES) tablet 0.1 mg  0.1 mg Oral BH-qamhs Niel Hummer, NP       Followed by  . [START ON 02/26/2016] cloNIDine (CATAPRES) tablet 0.1 mg  0.1 mg Oral QAC breakfast Niel Hummer, NP      . dicyclomine (BENTYL) tablet 20 mg  20 mg Oral Q6H PRN Niel Hummer, NP      . FLUoxetine (PROZAC) capsule 20 mg  20 mg Oral Daily Niel Hummer, NP   20 mg at 02/21/16 1536  . hydrOXYzine (ATARAX/VISTARIL) tablet 25 mg  25 mg Oral Q6H PRN Niel Hummer, NP   25 mg at 02/21/16  1537  . loperamide (IMODIUM) capsule 2-4 mg  2-4 mg Oral PRN Niel Hummer, NP      . magnesium hydroxide (MILK OF MAGNESIA) suspension 30 mL  30 mL Oral Daily PRN Patrecia Pour, NP      . methocarbamol (ROBAXIN) tablet 500 mg  500 mg Oral Q8H PRN Niel Hummer, NP      . naproxen (NAPROSYN) tablet 500 mg  500 mg Oral BID PRN Niel Hummer, NP   500 mg at 02/21/16 1537  . nicotine (NICODERM CQ - dosed in mg/24 hours) patch 21 mg  21 mg Transdermal Daily Niel Hummer, NP   21 mg at 02/21/16 1537  . ondansetron (ZOFRAN-ODT) disintegrating tablet 4 mg  4 mg Oral Q6H PRN Niel Hummer, NP       PTA Medications: Prescriptions Prior to Admission  Medication Sig Dispense Refill Last Dose  . acetaminophen (TYLENOL) 500 MG tablet Take 1,000 mg by mouth every 6 (six) hours as needed for mild pain, moderate pain or headache.   02/20/2016 at Unknown time  . ARIPiprazole (ABILIFY) 5 MG tablet Take 1 tablet (5 mg total) by mouth daily. For mood control (Patient not taking: Reported on 02/20/2016) 30 tablet 0 Not Taking at Unknown time  . atorvastatin (LIPITOR) 20 MG tablet Take 1 tablet (20 mg total) by mouth daily at 6 PM.  For high cholesterol (Patient not taking: Reported on 02/20/2016) 30 tablet 0 Not Taking at Unknown time  . benztropine (COGENTIN) 0.5 MG tablet Take 1 tablet (0.5 mg total) by mouth daily. For prevention of drug induced tremors (Patient not taking: Reported on 02/20/2016) 30 tablet 0 Not Taking at Unknown time  . busPIRone (BUSPAR) 7.5 MG tablet Take 1 tablet (7.5 mg total) by mouth 3 (three) times daily. For anxiety (Patient not taking: Reported on 02/20/2016) 90 tablet 0 Not Taking at Unknown time  . FLUoxetine (PROZAC) 40 MG capsule Take 1 capsule (40 mg total) by mouth daily. For depression (Patient not taking: Reported on 02/20/2016) 30 capsule 0 Not Taking at Unknown time  . hydrOXYzine (ATARAX/VISTARIL) 50 MG tablet Take 1 tablet (50 mg total) by mouth every 6 (six) hours as needed for anxiety. (Patient not taking: Reported on 02/20/2016) 60 tablet 0 Not Taking at Unknown time  . ibuprofen (ADVIL,MOTRIN) 800 MG tablet Take 1 tablet (800 mg total) by mouth 3 (three) times daily. For pain (Patient not taking: Reported on 02/20/2016) 1 tablet 0 Not Taking at Unknown time  . lidocaine (LIDODERM) 5 % Place 1 patch onto the skin daily. Remove & Discard patch within 12 hours or as directed by MD: For pain management (Patient not taking: Reported on 02/20/2016) 14 patch 0 Not Taking at Unknown time  . metFORMIN (GLUCOPHAGE) 500 MG tablet Take 1 tablet (500 mg total) by mouth daily with breakfast. For diabetes management (Patient not taking: Reported on 02/20/2016) 30 tablet 0 Not Taking at Unknown time  . methocarbamol (ROBAXIN) 500 MG tablet Take 1 tablet (500 mg total) by mouth 2 (two) times daily. For muscle pain (Patient not taking: Reported on 02/20/2016) 30 tablet 0 Not Taking at Unknown time  . nicotine (NICODERM CQ - DOSED IN MG/24 HOURS) 21 mg/24hr patch Place 1 patch (21 mg total) onto the skin daily. For smoking cessation (Patient not taking: Reported on 02/20/2016) 28 patch 0 Not Taking at Unknown time   . pantoprazole (PROTONIX) 40 MG tablet Take 1 tablet (40 mg total) by  mouth 2 (two) times daily. For acid reflux (Patient not taking: Reported on 02/20/2016) 60 tablet 0 Not Taking at Unknown time  . QUEtiapine (SEROQUEL) 100 MG tablet Take 1 tablet (100 mg total) by mouth at bedtime. For mood control (Patient not taking: Reported on 02/20/2016) 30 tablet 0 Not Taking at Unknown time  . QUEtiapine (SEROQUEL) 25 MG tablet Take 1 tablet (25 mg total) by mouth 3 (three) times daily. For agitation (Patient not taking: Reported on 02/20/2016) 90 tablet 0 Not Taking at Unknown time  . traZODone (DESYREL) 100 MG tablet Take 1 tablet (100 mg total) by mouth at bedtime as needed for sleep. (Patient not taking: Reported on 02/20/2016) 30 tablet 0 Not Taking at Unknown time   Musculoskeletal: Strength & Muscle Tone: within normal limits Gait & Station: normal Patient leans: Right  Psychiatric Specialty Exam: Physical Exam  Constitutional:  Patient was found to be medically clear on 02/20/2016 at the [facility] by Dr. Alvino Chapel.   Genitourinary:  Genitourinary Comments: Denies any issues in this area  Psychiatric: His speech is normal. His mood appears anxious. His affect is not angry, not blunt, not labile and not inappropriate. Cognition and memory are normal. He exhibits a depressed mood.    Review of Systems  Constitutional: Positive for diaphoresis and malaise/fatigue.  HENT: Negative.   Eyes: Negative.   Respiratory: Negative.   Cardiovascular: Positive for chest pain (Negative EKG in ED, report right sided muscle pain).       Elevated blood pressure  Gastrointestinal: Negative.   Genitourinary: Negative.   Musculoskeletal: Negative.   Skin: Negative.   Neurological: Positive for weakness.  Endo/Heme/Allergies: Negative.   Psychiatric/Behavioral: Positive for depression, substance abuse (Alcohol use disorder) and suicidal ideas (Reports plan to overdose or wreck motorcycle ). Negative for  hallucinations and memory loss. The patient is nervous/anxious and has insomnia.     Blood pressure 134/78, pulse 92, temperature 98.7 F (37.1 C), temperature source Oral, resp. rate 16, height $RemoveBe'5\' 10"'GdHcByCVo$  (1.778 m), weight 129.3 kg (285 lb).Body mass index is 40.89 kg/m.  General Appearance: Disheveled  Eye Sport and exercise psychologist::  Fair  Speech:  Clear and Coherent and Normal Rate  Volume:  Normal  Mood:  Anxious and Irritable  Affect:  Depressed  Thought Process:  Coherent, Goal Directed and Logical  Orientation:  Full (Time, Place, and Person)  Thought Content:  Rumination, denies any hallucinations.  Suicidal Thoughts:  Yes.  with intent/plan  Homicidal Thoughts:  No  Memory:  Immediate;   Good Recent;   Good Remote;   Good  Judgement:  Fair  Insight:  Fair  Psychomotor Activity:  Increased  Concentration:  Fair  Recall:  [place] of Knowledge:Fair  Language: Good  Akathisia:  No  Handed:  Right  AIMS (if indicated):     Assets:  Communication Skills Desire for Improvement Resilience  ADL's:  Intact  Cognition: WNL  Sleep: 4.6   Observation Level/Precautions:  Continuous Observation  Laboratory:  Per ED  Psychotherapy: Individual for substance abuse   Medications: See Treatment Plan  Consultations: As needed  Discharge Concerns:  Safety, sobriety  Estimated LOS: 24-48 hours  Other:    Treatment Plan/Recommendations: 1. Admit for crisis management and stabilization to [facility], estimated length of stay 24-48 hours.  2. Medication management to reduce current symptoms to base line and improve the patient's overall level of functioning; Librium 25 mg prn for acute withdrawal syndrome, Fluoxetine 20 mg for depression, Clonidine protocol for opiate  detox, Abilify 2 mg daily for mood stabilization 3. Treat health problems as indicated. 4. Develop treatment plan to decrease risk of relapse upon discharge and the need for readmission.  5. Psycho-social education regarding  relapse prevention and self care.  6. Health care follow up as needed for medical problems. Order Z-pack due to recent diagnosis in ED of unspecified chest wall pain but patient was unable to afford the medication after discharge.  7. Review, reconcile, and reinstate any pertinent home medications for other health issues where appropriate;  8. Call for consults with hospitalist for any additional specialty patient care services as needed.  Elmarie Shiley, PMHNP - [place] 10/9/20174:32 PM

## 2016-02-21 NOTE — Discharge Instructions (Signed)
To help you maintain a sober lifestyle, a substance abuse treatment program may be beneficial to you.  Contact one of the following facilities at your earliest opportunity to ask about enrolling: ° °RESIDENTIAL PROGRAMS: ° °     [facility] °     [address] °     [place], [place] [address] °     [phone] ° °     [facility] °     [address] °     [place], [place] [address] °     [phone] ° °     [facility] °     [address] °     [place], [place] [address] °     [phone] ° °OUTPATIENT PROGRAMS: ° °     [facility] °     [address] °     [place], [place] [address] °     [phone] °     New patients are seen at the walk-in clinic every Tuesday from 9:00 am - 12:00 pm. °

## 2016-02-21 NOTE — Progress Notes (Signed)
D: Patient in bed awake. Presents with sad affect and depressed mood. Endorses passive SI. Verbally contracts for safety. Requested "vistaril" for anxiety. Accepted his bedtime meds. No behavioral issues noted.  A: Staff offered support and encouragement as needed. Safety maintained by constant observation except when patient is in the bathroom. Will continue to monitor patient for safety and stability.  R: Patient remains safe.

## 2016-02-21 NOTE — [facility] Suicide Risk Assessment (Deleted)
Suicide Risk Assessment  Discharge Assessment   [facility]   Principal Problem: Cocaine abuse with cocaine-induced mood disorder [facility](HCC) Discharge Diagnoses:  Patient Active Problem List   Diagnosis Date Noted  . Cocaine abuse with cocaine-induced mood disorder (HCC) [F14.14] 02/21/2016    Priority: High  . MDD (major depressive disorder), recurrent severe, without psychosis (HCC) [F33.2] 06/18/2015  . Substance induced mood disorder (HCC) [F19.94] 03/08/2015  . Suicidal ideation [R45.851]   . COPD (chronic obstructive pulmonary disease) (HCC) [J44.9] 02/09/2015  . Tobacco use disorder [F17.200] 02/09/2015  . Stimulant use disorder (cocaine) [F15.90] 02/09/2015  . Alcohol use disorder, severe, in sustained remission (HCC) [F10.21] 02/09/2015  . Ureteral stone [N20.1] 12/31/2014  . 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 CAD s/p CABG 2012 [facility] [I25.810] 11/28/2011  . Hyperlipidemia LDL goal <100 [E78.5] 03/13/2007  . Essential hypertension [I10] 03/13/2007  . GERD [K21.9] 03/13/2007    Total Time spent with patient: 45 minutes  Musculoskeletal: Strength & Muscle Tone: within normal limits Gait & Station: normal Patient leans: N/A  Psychiatric Specialty Exam: Physical Exam  Constitutional: He is oriented to person, place, and time. He appears well-developed and well-nourished.  HENT:  Head: Normocephalic.  Neck: Normal range of motion.  Respiratory: Effort normal.  Musculoskeletal: Normal range of motion.  Neurological: He is alert and oriented to person, place, and time.  Skin: Skin is warm and dry.  Psychiatric: His speech is normal and behavior is normal. Judgment and thought content normal. Cognition and memory are normal. He exhibits a depressed mood.    Review of Systems  Constitutional: Negative.   HENT: Negative.   Eyes: Negative.   Respiratory: Negative.   Cardiovascular: Negative.    Gastrointestinal: Negative.   Genitourinary: Negative.   Musculoskeletal: Negative.   Skin: Negative.   Neurological: Negative.   Endo/Heme/Allergies: Negative.   Psychiatric/Behavioral: Positive for depression and substance abuse.    Blood pressure 155/96, pulse 83, temperature 97.6 F (36.4 C), temperature source Oral, resp. rate 18, SpO2 100 %.There is no height or weight on file to calculate BMI.  General Appearance: Casual  Eye Contact:  Good  Speech:  Normal Rate  Volume:  Normal  Mood:  Depressed, mild  Affect:  Congruent  Thought Process:  Coherent and Descriptions of Associations: Intact  Orientation:  Full (Time, Place, and Person)  Thought Content:  WDL  Suicidal Thoughts:  No  Homicidal Thoughts:  No  Memory:  Immediate;   Good Recent;   Good Remote;   Good  Judgement:  Fair  Insight:  Fair  Psychomotor Activity:  Normal  Concentration:  Concentration: Good and Attention Span: Good  Recall:  Good  Fund of Knowledge:  Good  Language:  Good  Akathisia:  No  Handed:  Right  AIMS (if indicated):     Assets:  Housing Leisure Time Physical Health Resilience  ADL's:  Intact  Cognition:  WNL  Sleep:       Mental Status Per Nursing Assessment::   On Admission:   cocaine abuse with suicidal ideations  Demographic Factors:  Male and Caucasian  Loss Factors: NA  Historical Factors: NA  Risk Reduction Factors:   Sense of responsibility to family and Positive social support  Continued Clinical Symptoms:  Depression, mild  Cognitive Features That Contribute To Risk:  None    Suicide Risk:  Minimal: No identifiable suicidal ideation.  Patients presenting with no risk factors but with morbid  ruminations; may be classified as minimal risk based on the severity of the depressive symptoms    Plan Of Care/Follow-up recommendations:  Activity:  as tolerated Diet:  heart healthy diet  Steven Marcou, NP 02/21/2016, 11:22 AM

## 2016-02-21 NOTE — ED Notes (Signed)
Pt is very irritable.  He cursed staff member when asked to sign in to observation unit and refused to sign.  Pt was angry that he wasn't getting a long term hospitalization.  He stated he has passive S/I earlier but his behavior shows more anger than any other emotion.

## 2016-02-21 NOTE — ED Notes (Signed)
Pt discharged ambulatory with [place] driver.  All belongings sent with patient.

## 2016-02-22 DIAGNOSIS — F17210 Nicotine dependence, cigarettes, uncomplicated: Secondary | ICD-10-CM | POA: Diagnosis not present

## 2016-02-22 DIAGNOSIS — Z8249 Family history of ischemic heart disease and other diseases of the circulatory system: Secondary | ICD-10-CM | POA: Diagnosis not present

## 2016-02-22 DIAGNOSIS — Z79899 Other long term (current) drug therapy: Secondary | ICD-10-CM | POA: Diagnosis not present

## 2016-02-22 DIAGNOSIS — F1414 Cocaine abuse with cocaine-induced mood disorder: Secondary | ICD-10-CM | POA: Diagnosis not present

## 2016-02-22 MED ORDER — ARIPIPRAZOLE 5 MG PO TABS
5.0000 mg | ORAL_TABLET | Freq: Every day | ORAL | Status: DC
  Administered 2016-02-23: 5 mg via ORAL
  Filled 2016-02-22: qty 1

## 2016-02-22 NOTE — B[facility] Counselor (Signed)
This Clinical research associatewriter spoke with this pt upon arrival this morning to determine discharge plan. Pt states that he is not ready to go home due to his increased Suicidal Ideation, pt currently denies HI. Pt reports that he does not have a plan for discharge, but when he thinks of once he will let the staff know. This Clinical research associatewriter suggested that pt seek OPT resources to follow up with his ongoing mental health issues. Pt was not receptive to this information and continued to insist that he needs to stay in this unit for a longer time as he is experiencing SI.

## 2016-02-22 NOTE — Progress Notes (Signed)
Patient ID: Steven ChangRoger L [name], male   DOB: 11/21/1970, 45 y.o.   MRN: [id number]   Patient seen and chart is reviewed. He was [facility] to the [facility] yesterday evening due to depression with suicidal thoughts. His mood has been very irritable since admission and nursing staff report that he has been demanding. Steven [name] has expressed being upset that he was not admitted to the adult unit but was informed by writer that [facility]. Today patient states "I'm not getting the help I need. I am just going to leave here and go straight to the dope house to overdose. I was only good last time I left in February because I got to stay for two weeks. I did not follow up with anything after I left though." Steven [name] was informed by Clinical research associatewriter that his medications would be adjusted during his Observation Unit stay but that his outpatient follow up would be of critical importance in order to stay on his medications post discharge. Yesterday his Prozac was restarted at 20 mg daily along with Abilify 2 mg daily for mood stabilization. The patient had reported being off his medications for a long period of time. Patient also informed that his condition could be managed in the [facility]-Observation unit and that outpatient follow up could be arranged. His Abilify will be increased to 5 mg daily starting tomorrow. Of note patient reported plan to overdose [facility] but denies ever acting on plan stating "Well that is because I was better after being on the unit for two weeks. But you people will not do that for me." Patient meets criteria to continue to be monitored in the Observation Unit. Patient per notes has been resistant to the treatment offered to him but is focused on being admitted to the adult unit. However, he has a history of non-compliance with follow up treatment. Patient to be re-evaluated in the morning for possible  discharge due to presentation that is consistent with feigning mental health symptoms to achieve inpatient hospitalization. Patient should be encouraged to follow up outpatient and to remain compliant with current medication regimen in place. His COW score today was two but patient has reported symptoms of opiate withdrawal from recent abuse.

## 2016-02-22 NOTE — Progress Notes (Signed)
D:  Patient reports depression and anxiety; he endorses passive suicidal ideation.  He denies homicidal ideation and AVH; mood anxious and irritable; no self-injurious behaviors noted or reported; Verbally contracts for safety A:  Medications given as scheduled; pt requested and received Hydroxyzine for anxiety (see MAR); emotional support given; encouraged him to seek assistance with needs/concerns. R:  Safety maintained on unit

## 2016-02-23 DIAGNOSIS — F332 Major depressive disorder, recurrent severe without psychotic features: Secondary | ICD-10-CM | POA: Diagnosis not present

## 2016-02-23 DIAGNOSIS — F1994 Other psychoactive substance use, unspecified with psychoactive substance-induced mood disorder: Secondary | ICD-10-CM | POA: Diagnosis not present

## 2016-02-23 DIAGNOSIS — F1120 Opioid dependence, uncomplicated: Secondary | ICD-10-CM | POA: Diagnosis not present

## 2016-02-23 DIAGNOSIS — F1410 Cocaine abuse, uncomplicated: Secondary | ICD-10-CM

## 2016-02-23 DIAGNOSIS — F1910 Other psychoactive substance abuse, uncomplicated: Secondary | ICD-10-CM | POA: Diagnosis present

## 2016-02-23 MED ORDER — FLUOXETINE HCL 20 MG PO CAPS: 20.0000 mg | ORAL_CAPSULE | Freq: Every day | ORAL | 0 refills | Status: DC

## 2016-02-23 MED ORDER — HYDROXYZINE HCL 25 MG PO TABS: 25.0000 mg | ORAL_TABLET | Freq: Four times a day (QID) | ORAL | 0 refills | Status: DC | PRN

## 2016-02-23 MED ORDER — NICOTINE 21 MG/24HR TD PT24: 21.0000 mg | MEDICATED_PATCH | Freq: Every day | TRANSDERMAL | 0 refills | Status: DC

## 2016-02-23 MED ORDER — TRAZODONE HCL 50 MG PO TABS
50.0000 mg | ORAL_TABLET | Freq: Every evening | ORAL | Status: DC | PRN
  Administered 2016-02-23: 50 mg via ORAL

## 2016-02-23 MED ORDER — TRAZODONE HCL 50 MG PO TABS: 50.0000 mg | ORAL_TABLET | Freq: Every evening | ORAL | 0 refills | Status: DC | PRN

## 2016-02-23 MED ORDER — AZITHROMYCIN 250 MG PO TABS: ORAL_TABLET | ORAL | 0 refills | Status: DC

## 2016-02-23 MED ORDER — ARIPIPRAZOLE 5 MG PO TABS: 5.0000 mg | ORAL_TABLET | Freq: Every day | ORAL | 0 refills | Status: DC

## 2016-02-23 MED ORDER — TRAZODONE HCL 50 MG PO TABS
ORAL_TABLET | ORAL | Status: AC
  Administered 2016-02-23: 01:00:00
  Filled 2016-02-23: qty 1

## 2016-02-23 NOTE — Progress Notes (Signed)
Patient lying in bed with eyes closed at the time of shift change. Responded to greetings with eyes closed. Patient stated "nothing has changed. Same same". Patient did not want to elaborate on his statement but kept mute when asked what's really have not changed. Patients mood is irritable. Complains about the food, not "good enough". Accepted his bedtime meds. Sleeping at this time.  Staff continues to observe patient for safety and stability.

## 2016-02-23 NOTE — Discharge Summary (Signed)
[facility] OBS UNIT DISCHARGE SUMMARY  Patient Identification: Steven [name]  MRN:  [id number]  Date of Evaluation:  02/23/2016  Principal Diagnosis: Substance induced mood disorder (HCC)   Diagnosis:   Patient Active Problem List   Diagnosis Date Noted  . Polysubstance abuse [F19.10] 02/23/2016    Priority: High  . Opiate dependence (HCC) [F11.20] 02/23/2016    Priority: High  . MDD (major depressive disorder), recurrent severe, without psychosis (HCC) [F33.2] 06/18/2015    Priority: High  . Cocaine abuse [F14.10] 03/08/2015    Priority: High  . Substance induced mood disorder (HCC) [F19.94] 03/08/2015    Priority: High  . Suicidal ideation [R45.851]   . COPD (chronic obstructive pulmonary disease) (HCC) [J44.9] 02/09/2015  . Tobacco use disorder [F17.200] 02/09/2015  . Stimulant use disorder (cocaine) [F15.90] 02/09/2015  . Alcohol use disorder, severe, in sustained remission (HCC) [F10.21] 02/09/2015  . Ureteral stone [N20.1] 12/31/2014  . 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 CAD s/p CABG 2012 [facility] [I25.810] 11/28/2011  . Hyperlipidemia LDL goal <100 [E78.5] 03/13/2007  . Essential hypertension [I10] 03/13/2007  . GERD [K21.9] 03/13/2007   Subjective: Pt states: "I don't want to discharge. I want to come inpatient." Pt seen and chart reviewed. Pt is alert, oriented x4, mildly anxious, irritable, noncompliant, and appropriate. Pt denies homicidal ideation and psychosis and does not appear to be responding to internal stimuli. However, pt continues to report chronic suicidal ideation, yet with minimal risk given longstanding history of fleeting suicidal thoughts without action. Pt has a high degree of secondary gain and continues to request inpatient admission in spite of not meeting inpatient criteria.   History of Present Illness: I have reviewed and concur with HPI elements below, modified as follows: Steven [name] is a 45 year old male who  presented to [facility] reporting depressive symptoms with active suicidal ideation. Patient reports he has been feeling suicidal for the past month but notes it has worsened over the past few days. He reports "I no longer want to deal with this anymore. If you guys let me out tomorrow I will go get some heroin to overdose on." Of note patient has reported this plan during past admissions but does not have history of following through.  Patient reports having SI with multiple plans. He states he has thought about overdosing on heroin or crashing on his motorcycle. Denies HI or auditory/visual hallucinations. Patient reports drinking one beer and snorting two lines of cocaine three days ago. Denies any other recent alcohol or drug use. Patient denies taking any medications at home. He report not taking his psychiatric medication for several months. Patient is endorsing withdrawal symptoms such as irritable mood and sweating. The patient is requesting to be restarted on prozac and abilify as they were helpful in the past. According to his last discharge summary the patient was on this combination when discharged from [facility] in February of 2017. The patient's current urine drug screen is positive for opiates and cocaine with negative alcohol level.   Pt spent the past 2 nights in [facility] OBS unit without incident. He has been cooperative with staff, yet slightly irritable about discharge planning. Pt evaluated today for discharge as above.   Total Time spent with patient: 45 minutes  Past Psychiatric History: Bipolar 1 disorder by history, diagnosis per last discharge summary MDD on 06/28/2015   Risk to Self: Is patient at risk for suicide?: Yes Risk to Others: No Prior Inpatient Therapy:  Yes Prior Outpatient Therapy: Yes  Alcohol Screening: 1. How often do you have a drink containing alcohol?: 2 to 3 times a week 2. How many drinks containing alcohol do you have on a typical day when you are drinking?: 3 or 4 3. How  often do you have six or more drinks on one occasion?: Monthly Preliminary Score: 3 4. How often during the last year have you found that you were not able to stop drinking once you had started?: Never 5. How often during the last year have you failed to do what was normally expected from you becasue of drinking?: Never 6. How often during the last year have you needed a first drink in the morning to get yourself going after a heavy drinking session?: Never 7. How often during the last year have you had a feeling of guilt of remorse after drinking?: Never 8. How often during the last year have you been unable to remember what happened the night before because you had been drinking?: Never 9. Have you or someone else been injured as a result of your drinking?: No 10. Has a relative or friend or a doctor or another health worker been concerned about your drinking or suggested you cut down?: Yes, during the last year Alcohol Use Disorder Identification Test Final Score (AUDIT): 10 Brief Intervention: Yes  Substance Abuse History in the last 12 months:  Yes.   His urine drug screen is positive for opiates and cocaine. Reports occasional alcohol use over the past few months.   Consequences of Substance Abuse: Medical Consequences:  Liver damage, Possible death by overdose Legal Consequences:  Arrests, jail time, Loss of driving privilege. Family Consequences:  Family discord, divorce and or separation.  Previous Psychotropic Medications: Yes Seroquel, Abilify, Prozac, Buspar,   Psychological Evaluations: No   Past Medical History:  Past Medical History:  Diagnosis Date  . Asthma   . Bipolar disorder (manic depression) (HCC)   . COPD (chronic obstructive pulmonary disease) (HCC)   . Coronary artery disease   . Hx of CABG   . Hypercholesteremia   . Hypertension   . Kidney stone   . Tobacco use disorder     Past Surgical History:  Procedure Laterality Date  . ABDOMINAL ANGIOGRAM   08/15/2012   Procedure: ABDOMINAL ANGIOGRAM;  Surgeon: Corky Crafts, MD;  Location: [facility] CATH LAB;  Service: Cardiovascular;;  . APPENDECTOMY    . CARDIOVERSION N/A 08/28/2012   Procedure: Pseudo Compression;  Surgeon: Chuck Hint, MD;  Location: [facility] CATH LAB;  Service: Cardiovascular;  Laterality: N/A;  . CORONARY ARTERY BYPASS GRAFT    . CYSTOSCOPY WITH RETROGRADE PYELOGRAM, URETEROSCOPY AND STENT PLACEMENT Right 12/31/2014   Procedure: CYSTOSCOPY  RIGHT URETEROSCOPY WITH STONE RETREIVAL RIGHT RETROGRADE PYELOGRAM;  Surgeon: Malen Gauze, MD;  Location: [facility] ORS;  Service: Urology;  Laterality: Right;  . KIDNEY STONE SURGERY    . LOWER EXTREMITY ANGIOGRAM N/A 08/15/2012   Procedure: LOWER EXTREMITY ANGIOGRAM with possible PTA /stent;  Surgeon: Corky Crafts, MD;  Location: [facility] CATH LAB;  Service: Cardiovascular;  Laterality: N/A;   Family History:  Family History  Problem Relation Age of Onset  . Heart disease Father   . Hyperlipidemia Father   . Heart disease Maternal Grandmother    Family Psychiatric  History: Denies  Social History:  History  Alcohol Use  . Yes    Comment: last drink 03/07/2015      History  Drug Use  .  Types: Cocaine, Marijuana    Comment: 10/23//2016    Social History   Social History  . Marital status: Divorced    Spouse name: N/A  . Number of children: N/A  . Years of education: N/A   Social History Main Topics  . Smoking status: Current Every Day Smoker    Packs/day: 1.00    Years: 31.00    Types: Cigarettes  . Smokeless tobacco: Former NeurosurgeonUser    Quit date: 04/27/2013  . Alcohol use Yes     Comment: last drink 03/07/2015   . Drug use:     Types: Cocaine, Marijuana     Comment: 10/23//2016  . Sexual activity: Yes    Birth control/ protection: Condom   Other Topics Concern  . None   Social History Narrative  . None   Additional Social History:    Allergies:  No Known Allergies  Lab Results:  No results found  for this or any previous visit (from the past 48 hour(s)). Metabolic Disorder Labs:  Lab Results  Component Value Date   HGBA1C 6.5 (H) 04/06/2015   MPG 140 04/06/2015   MPG 97 04/27/2013   Lab Results  Component Value Date   PROLACTIN 18.2 (H) 04/06/2015   Lab Results  Component Value Date   CHOL 284 (H) 04/06/2015   TRIG 378 (H) 04/06/2015   HDL 43 04/06/2015   CHOLHDL 6.6 04/06/2015   VLDL 76 (H) 04/06/2015   LDLCALC 165 (H) 04/06/2015   LDLCALC 114 (H) 02/10/2015   Current Medications: Current Facility-Administered Medications  Medication Dose Route Frequency Provider Last Rate Last Dose  . acetaminophen (TYLENOL) tablet 650 mg  650 mg Oral Q6H PRN Charm RingsJamison Y Lord, NP   650 mg at 02/23/16 0739  . alum & mag hydroxide-simeth (MAALOX/MYLANTA) 200-200-20 MG/5ML suspension 30 mL  30 mL Oral Q4H PRN Charm RingsJamison Y Lord, NP      . ARIPiprazole (ABILIFY) tablet 5 mg  5 mg Oral Daily Thermon LeylandLaura A Davis, NP   5 mg at 02/23/16 0740  . azithromycin (ZITHROMAX) tablet 250 mg  250 mg Oral Daily Thermon LeylandLaura A Davis, NP   250 mg at 02/23/16 0740  . chlordiazePOXIDE (LIBRIUM) capsule 25 mg  25 mg Oral Q6H PRN Thermon LeylandLaura A Davis, NP   25 mg at 02/23/16 0740  . cloNIDine (CATAPRES) tablet 0.1 mg  0.1 mg Oral QID Thermon LeylandLaura A Davis, NP   0.1 mg at 02/23/16 0743   Followed by  . [START ON 02/24/2016] cloNIDine (CATAPRES) tablet 0.1 mg  0.1 mg Oral BH-qamhs Thermon LeylandLaura A Davis, NP       Followed by  . [START ON 02/26/2016] cloNIDine (CATAPRES) tablet 0.1 mg  0.1 mg Oral QAC breakfast Thermon LeylandLaura A Davis, NP      . dicyclomine (BENTYL) tablet 20 mg  20 mg Oral Q6H PRN Thermon LeylandLaura A Davis, NP      . FLUoxetine (PROZAC) capsule 20 mg  20 mg Oral Daily Thermon LeylandLaura A Davis, NP   20 mg at 02/23/16 0740  . hydrOXYzine (ATARAX/VISTARIL) tablet 25 mg  25 mg Oral Q6H PRN Thermon LeylandLaura A Davis, NP   25 mg at 02/22/16 1626  . loperamide (IMODIUM) capsule 2-4 mg  2-4 mg Oral PRN Thermon LeylandLaura A Davis, NP      . magnesium hydroxide (MILK OF MAGNESIA) suspension 30 mL  30 mL  Oral Daily PRN Charm RingsJamison Y Lord, NP      . methocarbamol (ROBAXIN) tablet 500 mg  500 mg Oral Q8H  PRN Thermon Leyland, NP      . naproxen (NAPROSYN) tablet 500 mg  500 mg Oral BID PRN Thermon Leyland, NP   500 mg at 02/23/16 0115  . nicotine (NICODERM CQ - dosed in mg/24 hours) patch 21 mg  21 mg Transdermal Daily Thermon Leyland, NP   21 mg at 02/23/16 0741  . ondansetron (ZOFRAN-ODT) disintegrating tablet 4 mg  4 mg Oral Q6H PRN Thermon Leyland, NP      . traZODone (DESYREL) tablet 50 mg  50 mg Oral QHS,MR X 1 Kerry Hough, PA-C   50 mg at 02/23/16 0116   PTA Medications: Prescriptions Prior to Admission  Medication Sig Dispense Refill Last Dose  . acetaminophen (TYLENOL) 500 MG tablet Take 1,000 mg by mouth every 6 (six) hours as needed for mild pain, moderate pain or headache.   02/20/2016 at Unknown time  . ARIPiprazole (ABILIFY) 5 MG tablet Take 1 tablet (5 mg total) by mouth daily. For mood control (Patient not taking: Reported on 02/20/2016) 30 tablet 0 Not Taking at Unknown time  . atorvastatin (LIPITOR) 20 MG tablet Take 1 tablet (20 mg total) by mouth daily at 6 PM. For high cholesterol (Patient not taking: Reported on 02/20/2016) 30 tablet 0 Not Taking at Unknown time  . benztropine (COGENTIN) 0.5 MG tablet Take 1 tablet (0.5 mg total) by mouth daily. For prevention of drug induced tremors (Patient not taking: Reported on 02/20/2016) 30 tablet 0 Not Taking at Unknown time  . busPIRone (BUSPAR) 7.5 MG tablet Take 1 tablet (7.5 mg total) by mouth 3 (three) times daily. For anxiety (Patient not taking: Reported on 02/20/2016) 90 tablet 0 Not Taking at Unknown time  . FLUoxetine (PROZAC) 40 MG capsule Take 1 capsule (40 mg total) by mouth daily. For depression (Patient not taking: Reported on 02/20/2016) 30 capsule 0 Not Taking at Unknown time  . hydrOXYzine (ATARAX/VISTARIL) 50 MG tablet Take 1 tablet (50 mg total) by mouth every 6 (six) hours as needed for anxiety. (Patient not taking: Reported on  02/20/2016) 60 tablet 0 Not Taking at Unknown time  . ibuprofen (ADVIL,MOTRIN) 800 MG tablet Take 1 tablet (800 mg total) by mouth 3 (three) times daily. For pain (Patient not taking: Reported on 02/20/2016) 1 tablet 0 Not Taking at Unknown time  . lidocaine (LIDODERM) 5 % Place 1 patch onto the skin daily. Remove & Discard patch within 12 hours or as directed by MD: For pain management (Patient not taking: Reported on 02/20/2016) 14 patch 0 Not Taking at Unknown time  . metFORMIN (GLUCOPHAGE) 500 MG tablet Take 1 tablet (500 mg total) by mouth daily with breakfast. For diabetes management (Patient not taking: Reported on 02/20/2016) 30 tablet 0 Not Taking at Unknown time  . methocarbamol (ROBAXIN) 500 MG tablet Take 1 tablet (500 mg total) by mouth 2 (two) times daily. For muscle pain (Patient not taking: Reported on 02/20/2016) 30 tablet 0 Not Taking at Unknown time  . nicotine (NICODERM CQ - DOSED IN MG/24 HOURS) 21 mg/24hr patch Place 1 patch (21 mg total) onto the skin daily. For smoking cessation (Patient not taking: Reported on 02/20/2016) 28 patch 0 Not Taking at Unknown time  . pantoprazole (PROTONIX) 40 MG tablet Take 1 tablet (40 mg total) by mouth 2 (two) times daily. For acid reflux (Patient not taking: Reported on 02/20/2016) 60 tablet 0 Not Taking at Unknown time  . QUEtiapine (SEROQUEL) 100 MG tablet Take 1 tablet (100  mg total) by mouth at bedtime. For mood control (Patient not taking: Reported on 02/20/2016) 30 tablet 0 Not Taking at Unknown time  . QUEtiapine (SEROQUEL) 25 MG tablet Take 1 tablet (25 mg total) by mouth 3 (three) times daily. For agitation (Patient not taking: Reported on 02/20/2016) 90 tablet 0 Not Taking at Unknown time  . traZODone (DESYREL) 100 MG tablet Take 1 tablet (100 mg total) by mouth at bedtime as needed for sleep. (Patient not taking: Reported on 02/20/2016) 30 tablet 0 Not Taking at Unknown time   Musculoskeletal: Strength & Muscle Tone: within normal limits Gait &  Station: normal Patient leans: Right  Psychiatric Specialty Exam: Physical Exam  Constitutional:  Patient was found to be medically clear on 02/20/2016 at the [facility] by Dr. Rubin Payor.   Genitourinary:  Genitourinary Comments: Denies any issues in this area  Psychiatric: His speech is normal. His mood appears anxious. His affect is not angry, not blunt, not labile and not inappropriate. Cognition and memory are normal. He exhibits a depressed mood.    Review of Systems  Constitutional: Negative for diaphoresis and malaise/fatigue.  HENT: Negative.   Eyes: Negative.   Respiratory: Negative.   Cardiovascular: Negative for chest pain (Negative EKG in ED, report right sided muscle pain).       Elevated blood pressure  Gastrointestinal: Negative.   Genitourinary: Negative.   Musculoskeletal: Negative.   Skin: Negative.   Neurological: Negative for weakness.  Endo/Heme/Allergies: Negative.   Psychiatric/Behavioral: Positive for depression, substance abuse (Alcohol use disorder) and suicidal ideas (chronic, yet without intent). Negative for hallucinations and memory loss. The patient is nervous/anxious and has insomnia.   All other systems reviewed and are negative.   Blood pressure 121/76, pulse 84, temperature 97.4 F (36.3 C), temperature source Oral, resp. rate 18, height 5\' 10"  (1.778 m), weight 129.3 kg (285 lb), SpO2 99 %.Body mass index is 40.89 kg/m.  General Appearance: casual, fairly groomed  Patent attorney::  Fair yet improving  Speech:  Clear and Coherent and Normal Rate  Volume:  Normal  Mood:  Anxious and Irritable  Affect:  Depressed  Thought Process:  Coherent, Goal Directed and Logical  Orientation:  Full (Time, Place, and Person)  Thought Content:  Discharge plans, does not want to discharge, wants to go inpatient  Suicidal Thoughts:  Yes with ideas, chronic, no intent. Pt has a history of chronic suicidal ideation without attempts. High degree of secondary gain through  admission and pt continues to request admission  Homicidal Thoughts:  No  Memory:  Immediate;   Good Recent;   Good Remote;   Good  Judgement:  Fair  Insight:  Fair  Psychomotor Activity:  Normal  Concentration:  Fair  Recall:  Fair  Fund of Knowledge:Fair  Language: Good  Akathisia:  No  Handed:  Right  AIMS (if indicated):     Assets:  Communication Skills Desire for Improvement Resilience  ADL's:  Intact  Cognition: WNL  Sleep: 4.6    Treatment Plan/Recommendations:  Substance induced mood disorder (HCC) stable for outpatient management, treated as below  Medications: -Z pack (last 2 pills) -Continue Abilify 5mg  daily for mood stability -Continue Prozac 20mg  daily for MDD -Continue Vistaril 25mg  po q6h prn anxiety -Nicotine patch given (rx) -Trazodone 50mg  po qhs prn insomnia  Disposition: -Discharge home -Pt will followup outpatient with resources given at [facility], or similar -Rx for 14 days of psych meds  Laveda Abbe, [name]-C 10/11/201710:11 AM

## 2016-02-23 NOTE — Discharge Planning (Addendum)
[facility] Case Management Discharge Plan :  Will you be returning to the same living situation after discharge:  Yes,  Home  At discharge, do you have transportation home?: Yes,  Motorcycle  Do you have the ability to pay for your medications: No.  [facility] [place] (both [place])   Aeronautical engineerafety Planning and Suicide Prevention discussed: Yes,  Patient verbalized understanding; Voices no suicidal or homiciidal thought at time of discharge. Verbally contracts for safety.   Steven EngKaren H [name] 02/23/2016, 10:14 AM

## 2016-02-23 NOTE — Progress Notes (Addendum)
Written/verbal discharge instructions, prescriptions and follow-up appointment info given to patient with verbalization of understanding;  Patient voices nosuicidal and homicidal ideation. Suicide Prevention information/materials given to patient  All patient belongings returned to patient at time of discharge. Discharged home in stable condition. Patient threw AVS summary and prescriptions in shred box on the way out.  He said "I don't need them."

## 2016-02-23 NOTE — Progress Notes (Signed)
[facility] OBSERVATION UNIT:  Family/Significant Other Suicide Prevention Education  Suicide Prevention Education:  Patient Refusal for Family/Significant Other Suicide Prevention Education: The patient Steven [name] has refused to provide written consent for family/significant other to be provided Family/Significant Other Suicide Prevention Education during admission and/or prior to discharge.  Physician notified.  Camelia EngKaren H Vinetta Brach 02/23/2016, 9:50 AM   Camelia EngKaren H Rosanna Bickle, RN 02/23/16  9:50 AM

## 2016-02-23 NOTE — Progress Notes (Signed)
D:  Patient reports depression and anxiety; he endorses passive suicidal ideation.  He denies homicidal ideation and AVH; mood anxious and irritable; no self-injurious behaviors noted or reported; Verbally contracts for safety A:  Medications given as scheduled; pt requested and received Librium for anxiety (see MAR); emotional support given; encouraged him to seek assistance with needs/concerns. R:  Safety maintained on unit

## 2016-02-23 NOTE — [facility] Counselor (Signed)
This pt will be getting discharged later this afternoon and will be returning back to his current living environment. Pt Patient verbalized understanding; Voices no suicidal or homiciidal thought at time of discharge. Verbally contracts for safety. Pt will be instructed to follow up with OPT providers who offer services to pt that aren't insured and pt declined prescriptions.

## 2016-02-28 ENCOUNTER — Encounter (HOSPITAL_COMMUNITY): Payer: Self-pay | Admitting: Emergency Medicine

## 2016-02-28 ENCOUNTER — Emergency Department (HOSPITAL_COMMUNITY)
Admission: EM | Admit: 2016-02-28 | Discharge: 2016-02-28 | Disposition: A | Discharge status: Other Acute Inpatient Hospital | Payer: Self-pay | Attending: Emergency Medicine | Admitting: Emergency Medicine

## 2016-02-28 ENCOUNTER — Encounter (HOSPITAL_COMMUNITY): Payer: Self-pay

## 2016-02-28 ENCOUNTER — Observation Stay (HOSPITAL_COMMUNITY)
Admission: AD | Admit: 2016-02-28 | Discharge: 2016-03-01 | Disposition: A | Discharge status: Home / Self Care | Payer: No Typology Code available for payment source | Source: Intra-hospital | Attending: Psychiatry | Admitting: Psychiatry

## 2016-02-28 DIAGNOSIS — J449 Chronic obstructive pulmonary disease, unspecified: Secondary | ICD-10-CM | POA: Insufficient documentation

## 2016-02-28 DIAGNOSIS — I10 Essential (primary) hypertension: Secondary | ICD-10-CM | POA: Insufficient documentation

## 2016-02-28 DIAGNOSIS — I2510 Atherosclerotic heart disease of native coronary artery without angina pectoris: Secondary | ICD-10-CM | POA: Insufficient documentation

## 2016-02-28 DIAGNOSIS — F17210 Nicotine dependence, cigarettes, uncomplicated: Secondary | ICD-10-CM | POA: Insufficient documentation

## 2016-02-28 DIAGNOSIS — J45909 Unspecified asthma, uncomplicated: Secondary | ICD-10-CM | POA: Insufficient documentation

## 2016-02-28 DIAGNOSIS — Z5181 Encounter for therapeutic drug level monitoring: Secondary | ICD-10-CM | POA: Insufficient documentation

## 2016-02-28 DIAGNOSIS — F1414 Cocaine abuse with cocaine-induced mood disorder: Principal | ICD-10-CM | POA: Diagnosis present

## 2016-02-28 DIAGNOSIS — Z951 Presence of aortocoronary bypass graft: Secondary | ICD-10-CM | POA: Insufficient documentation

## 2016-02-28 DIAGNOSIS — R45851 Suicidal ideations: Secondary | ICD-10-CM

## 2016-02-28 DIAGNOSIS — F1910 Other psychoactive substance abuse, uncomplicated: Secondary | ICD-10-CM

## 2016-02-28 LAB — COMPREHENSIVE METABOLIC PANEL
ALBUMIN: 4.5 g/dL (ref 3.5–5.0)
ALK PHOS: 87 U/L (ref 38–126)
ALT: 35 U/L (ref 17–63)
AST: 39 U/L (ref 15–41)
Anion gap: 10 (ref 5–15)
BUN: 17 mg/dL (ref 6–20)
CALCIUM: 10.2 mg/dL (ref 8.9–10.3)
CHLORIDE: 98 mmol/L — AB (ref 101–111)
CO2: 28 mmol/L (ref 22–32)
CREATININE: 1.35 mg/dL — AB (ref 0.61–1.24)
GFR calc non Af Amer: >60 mL/min (ref >60)
GLUCOSE: 107 mg/dL — AB (ref 65–99)
Potassium: 3.5 mmol/L (ref 3.5–5.1)
SODIUM: 136 mmol/L (ref 135–145)
Total Bilirubin: 1 mg/dL (ref 0.3–1.2)
Total Protein: 7.9 g/dL (ref 6.5–8.1)

## 2016-02-28 LAB — CBC
HEMATOCRIT: 43.5 % (ref 39.0–52.0)
HEMOGLOBIN: 15.4 g/dL (ref 13.0–17.0)
MCH: 32.4 pg (ref 26.0–34.0)
MCHC: 35.4 g/dL (ref 30.0–36.0)
MCV: 91.6 fL (ref 78.0–100.0)
Platelets: 309 10*3/uL (ref 150–400)
RBC: 4.75 MIL/uL (ref 4.22–5.81)
RDW: 14.5 % (ref 11.5–15.5)
WBC: 7.9 10*3/uL (ref 4.0–10.5)

## 2016-02-28 LAB — RAPID URINE DRUG SCREEN, HOSP PERFORMED
AMPHETAMINES: NOT DETECTED
BARBITURATES: NOT DETECTED
Benzodiazepines: POSITIVE — AB
Cocaine: POSITIVE — AB
OPIATES: POSITIVE — AB
TETRAHYDROCANNABINOL: NOT DETECTED

## 2016-02-28 LAB — ETHANOL: Alcohol, Ethyl (B): <5 mg/dL (ref <5)

## 2016-02-28 LAB — SALICYLATE LEVEL

## 2016-02-28 LAB — ACETAMINOPHEN LEVEL: Acetaminophen (Tylenol), Serum: <10 ug/mL — ABNORMAL LOW (ref 10–30)

## 2016-02-28 MED ORDER — MAGNESIUM HYDROXIDE 400 MG/5ML PO SUSP: 30.0000 mL | Freq: Every day | ORAL | Status: DC | PRN

## 2016-02-28 MED ORDER — LORAZEPAM 1 MG PO TABS: 0.0000 mg | ORAL_TABLET | Freq: Four times a day (QID) | ORAL | Status: DC

## 2016-02-28 MED ORDER — LORAZEPAM 1 MG PO TABS: 0.0000 mg | ORAL_TABLET | Freq: Two times a day (BID) | ORAL | Status: DC

## 2016-02-28 MED ORDER — ACETAMINOPHEN 325 MG PO TABS
650.0000 mg | ORAL_TABLET | ORAL | Status: DC | PRN
  Administered 2016-02-29 – 2016-03-01 (×3): 650 mg via ORAL
  Filled 2016-02-28 (×3): qty 2

## 2016-02-28 MED ORDER — ALUM & MAG HYDROXIDE-SIMETH 200-200-20 MG/5ML PO SUSP
30.0000 mL | ORAL | Status: DC | PRN
Start: 2016-02-28 — End: 2016-02-28

## 2016-02-28 MED ORDER — ALUM & MAG HYDROXIDE-SIMETH 200-200-20 MG/5ML PO SUSP: 30.0000 mL | ORAL | Status: DC | PRN

## 2016-02-28 MED ORDER — IBUPROFEN 200 MG PO TABS: 600.0000 mg | ORAL_TABLET | Freq: Three times a day (TID) | ORAL | Status: DC | PRN

## 2016-02-28 MED ORDER — ACETAMINOPHEN 325 MG PO TABS: 650.0000 mg | ORAL_TABLET | ORAL | Status: DC | PRN

## 2016-02-28 MED ORDER — THIAMINE HCL 100 MG/ML IJ SOLN: 100.0000 mg | Freq: Every day | INTRAMUSCULAR | Status: DC

## 2016-02-28 MED ORDER — IBUPROFEN 600 MG PO TABS
600.0000 mg | ORAL_TABLET | Freq: Three times a day (TID) | ORAL | Status: DC | PRN
Start: 2016-02-28 — End: 2016-03-01

## 2016-02-28 MED ORDER — ARIPIPRAZOLE 5 MG PO TABS
5.0000 mg | ORAL_TABLET | Freq: Every day | ORAL | Status: DC
  Administered 2016-02-28 – 2016-03-01 (×3): 5 mg via ORAL
  Filled 2016-02-28: qty 1
  Filled 2016-02-28: qty 14
  Filled 2016-02-28 (×2): qty 1

## 2016-02-28 MED ORDER — ZOLPIDEM TARTRATE 5 MG PO TABS: 5.0000 mg | ORAL_TABLET | Freq: Every evening | ORAL | Status: DC | PRN

## 2016-02-28 MED ORDER — TRAZODONE HCL 50 MG PO TABS: 50.0000 mg | ORAL_TABLET | Freq: Every evening | ORAL | Status: DC | PRN

## 2016-02-28 MED ORDER — FLUOXETINE HCL 20 MG PO CAPS
20.0000 mg | ORAL_CAPSULE | Freq: Every day | ORAL | Status: DC
  Administered 2016-02-28 – 2016-03-01 (×3): 20 mg via ORAL
  Filled 2016-02-28: qty 1
  Filled 2016-02-28: qty 14
  Filled 2016-02-28 (×2): qty 1

## 2016-02-28 MED ORDER — HYDROXYZINE HCL 25 MG PO TABS
25.0000 mg | ORAL_TABLET | Freq: Four times a day (QID) | ORAL | Status: DC | PRN
  Administered 2016-02-29: 25 mg via ORAL
  Filled 2016-02-28: qty 1

## 2016-02-28 MED ORDER — VITAMIN B-1 100 MG PO TABS: 100.0000 mg | ORAL_TABLET | Freq: Every day | ORAL | Status: DC

## 2016-02-28 MED ORDER — NICOTINE 21 MG/24HR TD PT24
21.0000 mg | MEDICATED_PATCH | Freq: Every day | TRANSDERMAL | Status: DC
  Administered 2016-02-28 – 2016-03-01 (×3): 21 mg via TRANSDERMAL
  Filled 2016-02-28 (×2): qty 1

## 2016-02-28 MED ORDER — ONDANSETRON HCL 4 MG PO TABS: 4.0000 mg | ORAL_TABLET | Freq: Three times a day (TID) | ORAL | Status: DC | PRN

## 2016-02-28 MED ORDER — ONDANSETRON HCL 4 MG PO TABS
4.0000 mg | ORAL_TABLET | Freq: Three times a day (TID) | ORAL | Status: DC | PRN
  Administered 2016-02-29: 4 mg via ORAL
  Filled 2016-02-28: qty 1

## 2016-02-28 NOTE — ED Triage Notes (Signed)
Pt was d/c from [facility] on 10/10 for c/o SI and drug abuse. Pt presents to ED for same complaints at this time. Per history, story has not changed. Pt has multiple plans to commit suicide including wrecking his bike and death by all the drugs he is using. Pt sts he drinks a case and a half of beer every day as well as uses $300 worth of cocaine and $300 worth of opiates daily. Pt sts last night he was out riding his motorcycle while "high as hell." Pt denies HI. Pt A&Ox4 and ambulatory. Pt has blistering on his fingers from cocaine use.

## 2016-02-28 NOTE — Progress Notes (Signed)
Admission note.  Pt admitted to bed 2 at shift change.  Pt presents with SI and substance abuse.  Pt able to contract for safety, verbally.  Pt denies HI, AV/H.  Pt was a previous admit and was d/c 7 days ago.  Pt denies pain and discomfort.  Pt sts he wants to go to sleep and wants dinner.  Pt demanding and rude.  Pt sts sandwich is unacceptable and it dry.  "Go get me some mayo and mustard".   Pt given dinner, mayo and mustard.  Pt continuously observed for safety while on the unit except when in the bathroom.   Pt remains safe on unit.

## 2016-02-28 NOTE — ED Notes (Signed)
Oriented to room and unit.  Pt immediately asked for [employer] and started eating lunch.  He C/O suicidal ideation but contracts for safety.

## 2016-02-28 NOTE — ED Notes (Signed)
Bed: [facility] Expected date:  Expected time:  Means of arrival:  Comments: 

## 2016-02-28 NOTE — ED Notes (Signed)
Pt discharged ambulatory with [place] driver.  Pt was calm and cooperative.  All belongings were sent with pt. 

## 2016-02-28 NOTE — Progress Notes (Signed)
ED CM left pt uninsured [place] resources in his locker #36 CM spoke with pt who confirms uninsured [place] resident with no pcp.  CM provided written information to assist pt with determining choice for uninsured accepting pcps, discussed the importance of pcp vs EDP services for f/u care, www.needymeds.org, www.goodrx.com, discounted pharmacies and other [place] resources such as [facility] , [facility], affordable care act, financial assistance, uninsured dental services, [place] med assist, DSS and  health department  Provided resources for [place] uninsured accepting pcps like [facility], [facility] [address], [facility], [facility], local urgent care centers, [facility], [facility], general medical clinics, [facility], [facility] plus others, medication resources, [facility] out patient pharmacies and housing Provided [facility] contact information  

## 2016-02-28 NOTE — BH Assessment (Addendum)
Assessment Note  Steven [name] is an 45 y.o. male with history of of Bipolar Disorder (Manic Depression). He presents to [facility] with complaints of suicidal thoughts and drug abuse. Patient has a plan to commit suicide including overdosing on Heroin or wrecking his bike. He reports on-going suicidal thoughts for several yrs. Sts that his thoughts have worsened in the past 3 to 4 days. His depressive symptoms include loss of interest in usual pleasures, hopelessness, fatigue, crying spells, and anger/irritability. Appetite is poor. Patient sts that today is a first time eating since 02/23/2016. No HI. Denies a history of violent of aggressive behaviors. No legal issues. No auditory/visual hallucinations. Patient reports self medicating with Morphine, Cocaine, and Alcohol. SEE ADDITIONAL SOCIAL HISTORY for details related to patient's substance use. He has a history of INPT treatment at [facility]. He does not have a outpatient therapist or psychiatrist.  Sts that his father is his support system.   Diagnosis: Bipolar Disorder, Manic Depressive; Polysubstance Use Disorder  Past Medical History:  Past Medical History:  Diagnosis Date  . Asthma   . Bipolar disorder (manic depression) (HCC)   . COPD (chronic obstructive pulmonary disease) (HCC)   . Coronary artery disease   . Hx of CABG   . Hypercholesteremia   . Hypertension   . Kidney stone   . Tobacco use disorder     Past Surgical History:  Procedure Laterality Date  . ABDOMINAL ANGIOGRAM  08/15/2012   Procedure: ABDOMINAL ANGIOGRAM;  Surgeon: Corky Crafts, MD;  Location: [facility] CATH LAB;  Service: Cardiovascular;;  . APPENDECTOMY    . CARDIOVERSION N/A 08/28/2012   Procedure: Pseudo Compression;  Surgeon: Chuck Hint, MD;  Location: [facility] CATH LAB;  Service: Cardiovascular;  Laterality: N/A;  . CORONARY ARTERY BYPASS GRAFT    . CYSTOSCOPY WITH RETROGRADE PYELOGRAM, URETEROSCOPY AND STENT PLACEMENT Right 12/31/2014   Procedure: CYSTOSCOPY   RIGHT URETEROSCOPY WITH STONE RETREIVAL RIGHT RETROGRADE PYELOGRAM;  Surgeon: Malen Gauze, MD;  Location: [facility] ORS;  Service: Urology;  Laterality: Right;  . KIDNEY STONE SURGERY    . LOWER EXTREMITY ANGIOGRAM N/A 08/15/2012   Procedure: LOWER EXTREMITY ANGIOGRAM with possible PTA /stent;  Surgeon: Corky Crafts, MD;  Location: [facility] CATH LAB;  Service: Cardiovascular;  Laterality: N/A;    Family History:  Family History  Problem Relation Age of Onset  . Heart disease Father   . Hyperlipidemia Father   . Heart disease Maternal Grandmother     Social History:  reports that he has been smoking Cigarettes.  He has a 31.00 pack-year smoking history. He quit smokeless tobacco use about 2 years ago. He reports that he drinks alcohol. He reports that he uses drugs, including Cocaine and Marijuana.  Additional Social History:  Alcohol / Drug Use Pain Medications: Pt denies any pain medications but is positive for opiates.  Prescriptions: SEe MAR Over the Counter: SEE MAR History of alcohol / drug use?: No history of alcohol / drug abuse Longest period of sobriety (when/how long): 18 months within 5 yrs Negative Consequences of Use: Financial Withdrawal Symptoms: Tremors, Nausea / Vomiting, Fever / Chills Substance #1 Name of Substance 1: ETOH 1 - Age of First Use: Teens 1 - Amount (size/oz): 1 case to 1.5 case at a time when drinking. 1 - Frequency: Varies  1 - Duration: on-going 1 - Last Use / Amount: 02/27/2016 Substance #2 Name of Substance 2: Cocaine 2 - Age of First Use: 20's 2 - Amount (size/oz): Varies;"I  spent $1600 int he last 4 days" 2 - Frequency: daily  2 - Duration: daily for the past month  2 - Last Use / Amount: 02/28/2016 Substance #3 Name of Substance 3: Morphine 3 - Age of First Use: 40's 3 - Amount (size/oz): "Morphine 30's" 3 - Frequency: daily  3 - Duration: daily for the past month  3 - Last Use / Amount: 02/28/2016  CIWA: CIWA-Ar BP: 112/82 Pulse  Rate: 112 COWS:    Allergies: No Known Allergies  Home Medications:  (Not in a hospital admission)  OB/GYN Status:  No LMP for male patient.  General Assessment Data [facility] of Assessment: [facility] ED TTS Assessment: In system Is this a Tele or Face-to-Face Assessment?: Face-to-Face Is this an Initial Assessment or a Re-assessment for this encounter?: Initial Assessment Marital status: Divorced [place]:  (n/a) Is patient pregnant?: No Pregnancy Status: No Living Arrangements: Alone Can pt return to current living arrangement?: Yes Admission Status: Voluntary Is patient capable of signing voluntary admission?: Yes Referral Source: Self/Family/Friend Insurance type:  (self pay )     Crisis Care Plan Living Arrangements: Alone Legal Guardian: Other: (no legal guardian ) Name of Psychiatrist: None Name of Therapist: None  Education Status Is patient currently in school?: No Current Grade:  (n/a) Highest grade of school patient has completed: GED Name of school:  (n/a) Contact person:  (n/a)  Risk to self with the past 6 months Suicidal Ideation: Yes-Currently Present Has patient been a risk to self within the past 6 months prior to admission? : Yes Suicidal Intent: Yes-Currently Present Has patient had any suicidal intent within the past 6 months prior to admission? : Yes Is patient at risk for suicide?: Yes Suicidal Plan?: Yes-Currently Present Has patient had any suicidal plan within the past 6 months prior to admission? : Yes Specify Current Suicidal Plan:  (overdose on Heroin) Access to Means: Yes Specify Access to Suicidal Means:  (access to drugs (Heroin)) What has been your use of drugs/alcohol within the last 12 months?:  (alcohol, cocaine, and opiates ) Previous Attempts/Gestures: Yes How many times?:  (1x- cut wrist ) Other Self Harm Risks:  (history of cutting ) Triggers for Past Attempts: Unpredictable ("girl problems") Intentional Self Injurious  Behavior: None Family Suicide History: No Recent stressful life event(s): Other (Comment) (homeless and on-going drug use ) Persecutory voices/beliefs?: No Depression: Yes Depression Symptoms: Feeling angry/irritable, Feeling worthless/self pity, Loss of interest in usual pleasures, Guilt, Fatigue, Isolating Substance abuse history and/or treatment for substance abuse?: Yes Suicide prevention information given to non-admitted patients: Not applicable  Risk to Others within the past 6 months Homicidal Ideation: No Thoughts of Harm to Others: No Current Homicidal Intent: No Current Homicidal Plan: No Access to Homicidal Means: No Identified Victim:  (n/a) History of harm to others?: No Assessment of Violence: None Noted Violent Behavior Description:  (calm and cooperative ) Does patient have access to weapons?: No Criminal Charges Pending?: No Does patient have a court date: No Is patient on probation?: Yes  Psychosis Hallucinations: None noted Delusions: None noted  Mental Status Report Appearance/Hygiene: In scrubs Eye Contact: Fair Motor Activity: Unremarkable Speech: Logical/coherent Level of Consciousness: Alert Mood: Depressed Affect: Blunted Anxiety Level: Moderate Thought Processes: Relevant Judgement: Unimpaired Orientation: Person, Place, Situation, Time Obsessive Compulsive Thoughts/Behaviors: None  Cognitive Functioning Concentration: Decreased Memory: Recent Intact, Remote Intact IQ: Average Insight: Good Impulse Control: Fair Appetite: Good Weight Loss:  (0) Weight Gain:  (0) Sleep: Decreased  ADLScreening [facility]  Assessment Services) Patient's cognitive ability adequate to safely complete daily activities?: Yes Patient able to express need for assistance with ADLs?: Yes Independently performs ADLs?: Yes (appropriate for developmental age)  Prior Inpatient Therapy Prior Inpatient Therapy: Yes Prior Therapy Dates: 06/2015; 12/16; 11/16; 10/16 Prior  Therapy Facilty/Provider(s): [facility] Reason for Treatment: SI, SA  Prior Outpatient Therapy Prior Outpatient Therapy: No Prior Therapy Dates: None Prior Therapy Facilty/Provider(s): None Reason for Treatment: None Does patient have an ACCT team?: No Does patient have Intensive In-House Services?  : No Does patient have [facility] services? : No Does patient have P4CC services?: No  ADL Screening (condition at time of admission) Patient's cognitive ability adequate to safely complete daily activities?: Yes Is the patient deaf or have difficulty hearing?: No Does the patient have difficulty seeing, even when wearing glasses/contacts?: No Does the patient have difficulty concentrating, remembering, or making decisions?: No Patient able to express need for assistance with ADLs?: Yes Does the patient have difficulty dressing or bathing?: No Independently performs ADLs?: Yes (appropriate for developmental age) Does the patient have difficulty walking or climbing stairs?: No Weakness of Legs: None Weakness of Arms/Hands: None  Home Assistive Devices/Equipment Home Assistive Devices/Equipment: None    Abuse/Neglect Assessment (Assessment to be complete while patient is alone) Physical Abuse: Denies Verbal Abuse: Denies Sexual Abuse: Denies Exploitation of patient/patient's resources: Denies Self-Neglect: Denies Values / Beliefs Cultural Requests During Hospitalization: None Spiritual Requests During Hospitalization: None   Advance Directives (For Healthcare) Does patient have an advance directive?: No Would patient like information on creating an advanced directive?: No - patient declined information Nutrition Screen- [facility] Adult/[facility] Patient's home diet: Regular  Additional Information 1:1 In Past 12 Months?: No CIRT Risk: No Elopement Risk: No Does patient have medical clearance?: Yes     Disposition:  Disposition Initial Assessment Completed for this Encounter: Yes  On Site  Evaluation by:   Reviewed with Physician:    Octaviano Batty 02/28/2016 2:02 PM

## 2016-02-28 NOTE — ED Provider Notes (Signed)
[facility]-EMERGENCY DEPT Provider Note   CSN: [id number] Arrival date & time: 02/28/16  4098     History   Chief Complaint Chief Complaint  Patient presents with  . Suicidal  . Addiction Problem    HPI Steven [name] is a 45 y.o. male.  HPI Patient presents to the emergency department with substance abuse and suicidal ideation.  The patient states that he has been using crack along with opiates.  The patient states that he has a plan for committing suicide and states that he has attempted suicide in the past.  Patient states that nothing seems to make the condition better or worse. The patient denies chest pain, shortness of breath, headache,blurred vision, neck pain, fever, cough, weakness, numbness, dizziness, anorexia, edema, abdominal pain, nausea, vomiting, diarrhea, rash, back pain, dysuria, hematemesis, bloody stool, near syncope, or syncope. Past Medical History:  Diagnosis Date  . Asthma   . Bipolar disorder (manic depression) (HCC)   . COPD (chronic obstructive pulmonary disease) (HCC)   . Coronary artery disease   . Hx of CABG   . Hypercholesteremia   . Hypertension   . Kidney stone   . Tobacco use disorder     Patient Active Problem List   Diagnosis Date Noted  . Polysubstance abuse 02/23/2016  . Opiate dependence (HCC) 02/23/2016  . MDD (major depressive disorder), recurrent severe, without psychosis (HCC) 06/18/2015  . Cocaine abuse 03/08/2015  . Substance induced mood disorder (HCC) 03/08/2015  . Suicidal ideation   . COPD (chronic obstructive pulmonary disease) (HCC) 02/09/2015  . Tobacco use disorder 02/09/2015  . Stimulant use disorder (cocaine) 02/09/2015  . Alcohol use disorder, severe, in sustained remission (HCC) 02/09/2015  . Ureteral stone 12/31/2014  . Claudication of left lower extremity (HCC) 08/14/2012  . CAD s/p CABG 2012 [facility] 11/28/2011  . Hyperlipidemia LDL goal <100 03/13/2007  . Essential hypertension 03/13/2007  . GERD  03/13/2007    Past Surgical History:  Procedure Laterality Date  . ABDOMINAL ANGIOGRAM  08/15/2012   Procedure: ABDOMINAL ANGIOGRAM;  Surgeon: Corky Crafts, MD;  Location: [facility] CATH LAB;  Service: Cardiovascular;;  . APPENDECTOMY    . CARDIOVERSION N/A 08/28/2012   Procedure: Pseudo Compression;  Surgeon: Chuck Hint, MD;  Location: [facility] CATH LAB;  Service: Cardiovascular;  Laterality: N/A;  . CORONARY ARTERY BYPASS GRAFT    . CYSTOSCOPY WITH RETROGRADE PYELOGRAM, URETEROSCOPY AND STENT PLACEMENT Right 12/31/2014   Procedure: CYSTOSCOPY  RIGHT URETEROSCOPY WITH STONE RETREIVAL RIGHT RETROGRADE PYELOGRAM;  Surgeon: Malen Gauze, MD;  Location: [facility] ORS;  Service: Urology;  Laterality: Right;  . KIDNEY STONE SURGERY    . LOWER EXTREMITY ANGIOGRAM N/A 08/15/2012   Procedure: LOWER EXTREMITY ANGIOGRAM with possible PTA /stent;  Surgeon: Corky Crafts, MD;  Location: [facility] CATH LAB;  Service: Cardiovascular;  Laterality: N/A;       Home Medications    Prior to Admission medications   Medication Sig Start Date End Date Taking? Authorizing Provider  ARIPiprazole (ABILIFY) 5 MG tablet Take 1 tablet (5 mg total) by mouth daily. Patient not taking: Reported on 02/28/2016 02/24/16   Laveda Abbe, NP  atorvastatin (LIPITOR) 20 MG tablet Take 1 tablet (20 mg total) by mouth daily at 6 PM. For high cholesterol Patient not taking: Reported on 02/28/2016 06/28/15   Sanjuana Kava, NP  azithromycin (ZITHROMAX) 250 MG tablet Take 1 tab daily starting 02/24/16 Patient not taking: Reported on 02/28/2016 02/24/16   Laveda Abbe,  NP  FLUoxetine (PROZAC) 20 MG capsule Take 1 capsule (20 mg total) by mouth daily. Patient not taking: Reported on 02/28/2016 02/24/16   Laveda Abbe, NP  hydrOXYzine (ATARAX/VISTARIL) 25 MG tablet Take 1 tablet (25 mg total) by mouth every 6 (six) hours as needed for anxiety. Patient not taking: Reported on 02/28/2016 02/23/16   Laveda Abbe, NP  metFORMIN (GLUCOPHAGE) 500 MG tablet Take 1 tablet (500 mg total) by mouth daily with breakfast. For diabetes management Patient not taking: Reported on 02/28/2016 06/28/15   Sanjuana Kava, NP  nicotine (NICODERM CQ - DOSED IN MG/24 HOURS) 21 mg/24hr patch Place 1 patch (21 mg total) onto the skin daily. Patient not taking: Reported on 02/28/2016 02/24/16   Laveda Abbe, NP  traZODone (DESYREL) 50 MG tablet Take 1 tablet (50 mg total) by mouth at bedtime as needed for sleep. Patient not taking: Reported on 02/28/2016 02/23/16   Laveda Abbe, NP    Family History Family History  Problem Relation Age of Onset  . Heart disease Father   . Hyperlipidemia Father   . Heart disease Maternal Grandmother     Social History Social History  Substance Use Topics  . Smoking status: Current Every Day Smoker    Packs/day: 1.00    Years: 31.00    Types: Cigarettes  . Smokeless tobacco: Former Neurosurgeon    Quit date: 04/27/2013  . Alcohol use Yes     Comment: last drink 03/07/2015      Allergies   Review of patient's allergies indicates no known allergies.   Review of Systems Review of Systems All other systems negative except as documented in the HPI. All pertinent positives and negatives as reviewed in the HPI.  Physical Exam Updated Vital Signs BP 112/82 (BP Location: Left Arm)   Pulse 112   Temp 97.8 F (36.6 C) (Oral)   Resp 18   SpO2 94%   Physical Exam  Constitutional: He is oriented to person, place, and time. He appears well-developed and well-nourished. No distress.  HENT:  Head: Normocephalic and atraumatic.  Mouth/Throat: Oropharynx is clear and moist.  Eyes: Pupils are equal, round, and reactive to light.  Neck: Normal range of motion. Neck supple.  Cardiovascular: Normal rate, regular rhythm and normal heart sounds.  Exam reveals no gallop and no friction rub.   No murmur heard. Pulmonary/Chest: Effort normal and breath sounds normal.  No respiratory distress. He has no wheezes.  Abdominal: Soft. Bowel sounds are normal. He exhibits no distension. There is no tenderness.  Neurological: He is alert and oriented to person, place, and time. He exhibits normal muscle tone. Coordination normal.  Skin: Skin is warm and dry. Capillary refill takes less than 2 seconds. No rash noted. No erythema.  Nursing note and vitals reviewed.    ED Treatments / Results  Labs (all labs ordered are listed, but only abnormal results are displayed) Labs Reviewed  COMPREHENSIVE METABOLIC PANEL - Abnormal; Notable for the following:       Result Value   Chloride 98 (*)    Glucose, Bld 107 (*)    Creatinine, Ser 1.35 (*)    All other components within normal limits  ACETAMINOPHEN LEVEL - Abnormal; Notable for the following:    Acetaminophen (Tylenol), Serum <10 (*)    All other components within normal limits  RAPID URINE DRUG SCREEN, HOSP PERFORMED - Abnormal; Notable for the following:    Opiates POSITIVE (*)    Cocaine  POSITIVE (*)    Benzodiazepines POSITIVE (*)    All other components within normal limits  ETHANOL  SALICYLATE LEVEL  CBC    EKG  EKG Interpretation None       Radiology No results found.  Procedures Procedures (including critical care time)  Medications Ordered in ED Medications - No data to display   Initial Impression / Assessment and Plan / ED Course  I have reviewed the triage vital signs and the nursing notes.  Pertinent labs & imaging results that were available during my care of the patient were reviewed by me and considered in my medical decision making (see chart for details).  Clinical Course    The patient will need TTS assessment to further assess his suicidal ideation, in combination with his substance abuse  Final Clinical Impressions(s) / ED Diagnoses   Final diagnoses:  None    New Prescriptions New Prescriptions   No medications on file     Charlestine NightChristopher Ayeden Gladman,  PA-C 02/28/16 1041    Bethann BerkshireJoseph Zammit, MD 03/01/16 1226

## 2016-02-28 NOTE — ED Notes (Signed)
Attempted to call report x 2.  1709 I was told they would call me back, just now called with no answer.

## 2016-02-28 NOTE — ED Notes (Signed)
Bed: [facility] Expected date:  Expected time:  Means of arrival:  Comments: 

## 2016-02-28 NOTE — Progress Notes (Signed)
Pt sts his goal for this stay is to get food and rest.  Pt unable to verbalize any other goals.

## 2016-02-29 DIAGNOSIS — Z8249 Family history of ischemic heart disease and other diseases of the circulatory system: Secondary | ICD-10-CM

## 2016-02-29 DIAGNOSIS — Z79899 Other long term (current) drug therapy: Secondary | ICD-10-CM | POA: Diagnosis not present

## 2016-02-29 DIAGNOSIS — F1414 Cocaine abuse with cocaine-induced mood disorder: Secondary | ICD-10-CM | POA: Diagnosis not present

## 2016-02-29 DIAGNOSIS — F17210 Nicotine dependence, cigarettes, uncomplicated: Secondary | ICD-10-CM | POA: Diagnosis not present

## 2016-02-29 DIAGNOSIS — R45851 Suicidal ideations: Secondary | ICD-10-CM

## 2016-02-29 MED ORDER — HYDROXYZINE HCL 50 MG PO TABS
50.0000 mg | ORAL_TABLET | Freq: Four times a day (QID) | ORAL | Status: DC | PRN
  Administered 2016-02-29: 50 mg via ORAL
  Filled 2016-02-29: qty 1
  Filled 2016-02-29: qty 10

## 2016-02-29 MED ORDER — BUSPIRONE HCL 5 MG PO TABS
5.0000 mg | ORAL_TABLET | Freq: Two times a day (BID) | ORAL | Status: DC
  Administered 2016-02-29 – 2016-03-01 (×3): 5 mg via ORAL
  Filled 2016-02-29: qty 28
  Filled 2016-02-29 (×3): qty 1

## 2016-02-29 MED ORDER — TRAZODONE HCL 100 MG PO TABS
100.0000 mg | ORAL_TABLET | Freq: Every day | ORAL | Status: DC
  Administered 2016-02-29: 100 mg via ORAL
  Filled 2016-02-29: qty 1
  Filled 2016-02-29: qty 14

## 2016-02-29 MED ORDER — CHLORDIAZEPOXIDE HCL 25 MG PO CAPS
25.0000 mg | ORAL_CAPSULE | Freq: Four times a day (QID) | ORAL | Status: DC | PRN
  Administered 2016-02-29 – 2016-03-01 (×4): 25 mg via ORAL
  Filled 2016-02-29 (×4): qty 1

## 2016-02-29 NOTE — [facility] Counselor (Signed)
This Clinical research associatewriter just spoke with pt in regards to developing a discharge plan. Pt states that when he was last here he threw all of his discharge information away and [facility] not [facility]. Pt states that post his last discharge that he threw his prescriptions in the trash, due to not having any money to buy them. This pt informed this Clinical research associatewriter that [facility] post discharge from [facility]. This Clinical research associatewriter made telephone contact with Clifton Custardaron ([facility] in regards to scheduling pt for an intake appointment. This Clinical research associatewriter received written and verbal consent from this pt to release his information. (See ROI) Pt has an appointment on Thursday, March 02, 2016 @ 11:45am to meet with a clinical intake rep to determine eligibility for services. Pt was receptive to this information.

## 2016-02-29 NOTE — Progress Notes (Addendum)
D:  Patient reports depression and anxiety; he endorses passive suicidal ideation.  He denies homicidal ideation and AVH; mood anxious and irritable; no self-injurious behaviors noted or reported; Verbally contracts for safety A; Emotional support given; encouraged him to seek assistance with needs/concerns. R:  Safety maintained on unit

## 2016-02-29 NOTE — Progress Notes (Signed)
Pt sts he has [facility] th[facility]

## 2016-02-29 NOTE — Progress Notes (Signed)
Pt is alert and oriented and in bed at shift change.  Pt denies pain or discomfort at this time.  Pt asking about snack availability.  Pt denies SI, HI or AVH and is able to verbally contract for safety.  Pt sts he would be interested in a long term in patient program if available.   Pt given ice cream and pretzels.  Pt continuously observed while on unit for safety except when in the bathroom.  Pt given support and encouragement.  Pt in past hospitalizations is usually very angry and difficult.  Pt at this time is cooperative and friendly. Pt remains safe on unit

## 2016-02-29 NOTE — H&P (Signed)
[facility]-Observation Admission Assessment Adult  Patient Identification: Steven [name]  MRN:  [id number]  Date of Evaluation:  02/29/2016  Chief Complaint:  Patient states "I did not follow up after I left here but started drinking again right away."   Principal Diagnosis: Substance Induced Mood Disorder   Diagnosis:   Patient Active Problem List   Diagnosis Date Noted  . Polysubstance abuse [F19.10] 02/23/2016  . Opiate dependence (HCC) [F11.20] 02/23/2016  . 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 MDD (major depressive disorder), recurrent severe, without psychosis (HCC) [F33.2] 06/18/2015  . Substance induced mood disorder (HCC) [F19.94] 03/08/2015  . Suicidal ideation [R45.851]   . COPD (chronic obstructive pulmonary disease) (HCC) [J44.9] 02/09/2015  . Tobacco use disorder [F17.200] 02/09/2015  . Stimulant use disorder (cocaine) [F15.90] 02/09/2015  . Alcohol use disorder, severe, in sustained remission (HCC) [F10.21] 02/09/2015  . Ureteral stone [N20.1] 12/31/2014  . 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 CAD s/p CABG 2012 [facility] [I25.810] 11/28/2011  . Hyperlipidemia LDL goal <100 [E78.5] 03/13/2007  . Essential hypertension [I10] 03/13/2007  . GERD [K21.9] 03/13/2007   History of Present Illness:   Per initial Assessment note at [facility] on 02/28/2016:  Steven [name] is an 45 y.o. male with history of of Bipolar Disorder (Manic Depression). He presents to [facility] with complaints of suicidal thoughts and drug abuse. Patient has a plan to commit suicide including overdosing on Heroin or wrecking his bike. He reports on-going suicidal thoughts for several yrs. Sts that his thoughts have worsened in the past 3 to 4 days. His depressive symptoms include loss of interest in usual pleasures, hopelessness, fatigue, crying spells, and anger/irritability. Appetite is poor. Patient sts that today is a first time eating  since 02/23/2016. No HI. Denies a history of violent of aggressive behaviors. No legal issues. No auditory/visual hallucinations. Patient reports self medicating with Morphine, Cocaine, and Alcohol.  He has a history of INPT treatment at [facility]. He does not have a outpatient therapist or psychiatrist.  Reports that his father is his support system.    Patient is familiar to this Clinical research associate from past admission to [address] and from recent discharge from the [facility] on 02/21/2016. He was provided with resources to follow at that time with [facility] and [facility]. Observation Unit staff report that patient threw away his prescriptions during discharge at that time. When asked about if this was true patient replied "I did not have the money to fill them anyway." Warrick did not make an effort to follow up but admits to "I went to get drunk. I'm having the same old symptoms. Yes I want to kill myself but no I did not make an attempt. I just need a few days to clear my head. I do not have any housing but I have transportation. I need a thirty day program because of my severe substance abuse. I only want to get into [facility]. I'm not feeling well this morning. I feel sick to my stomach and threw up my medications. I'll be honest I want a beer, a hit, and a cigarette." The patient is requesting medication adjustment for anxiety and insomnia. He will be restarted on his Buspar and his Trazodone will be increased to 100 mg at hs. The counselor has been arranging for patient to have an appointment at [facility] for residential treatment with the earliest being Thursday 03/02/2016. Fielding shows writer blisters on his fingers from recent  cocaine abuse.   Associated Signs/Symptoms: Depression Symptoms:  depressed mood, anhedonia, insomnia, fatigue, feelings of worthlessness/guilt, hopelessness, suicidal thoughts with specific plan, anxiety,  (Hypo) Manic Symptoms:  Denies any hypomanic symptoms  Anxiety Symptoms:   Excessive Worry,  Psychotic Symptoms:  Denies any hallucinations  PTSD Symptoms: None reported  Total Time spent with patient: 30 minutes  Past Psychiatric History: Bipolar 1 disorder by history, diagnosis per last discharge summary MDD on 06/28/2015   Risk to Self: Yes due to chronic drug abuse with recent suicidal ideation but no reported attempts  Risk to Others: No Prior Inpatient Therapy: Yes Prior Outpatient Therapy: Yes  Alcohol Screening:    Substance Abuse History in the last 12 months:  Yes.   His urine drug screen is positive for opiates and cocaine. Reports heavy alcohol use over the last week since discharge from [facility]   Consequences of Substance Abuse: Medical Consequences:  Liver damage, Possible death by overdose Legal Consequences:  Arrests, jail time, Loss of driving privilege. Family Consequences:  Family discord, divorce and or separation.  Previous Psychotropic Medications: Yes Seroquel, Abilify, Prozac, Buspar,   Psychological Evaluations: No   Past Medical History:  Past Medical History:  Diagnosis Date  . Asthma   . Bipolar disorder (manic depression) ([place])   . COPD (chronic obstructive pulmonary disease) ([place])   . Coronary artery disease   . Hx of CABG   . Hypercholesteremia   . Hypertension   . Kidney stone   . Tobacco use disorder     Past Surgical History:  Procedure Laterality Date  . ABDOMINAL ANGIOGRAM  08/15/2012   Procedure: ABDOMINAL ANGIOGRAM;  Surgeon: Jettie Booze, MD;  Location: [facility] CATH LAB;  Service: Cardiovascular;;  . APPENDECTOMY    . CARDIOVERSION N/A 08/28/2012   Procedure: Pseudo Compression;  Surgeon: Angelia Mould, MD;  Location: [facility] CATH LAB;  Service: Cardiovascular;  Laterality: N/A;  . CORONARY ARTERY BYPASS GRAFT    . CYSTOSCOPY WITH RETROGRADE PYELOGRAM, URETEROSCOPY AND STENT PLACEMENT Right 12/31/2014   Procedure: CYSTOSCOPY  RIGHT URETEROSCOPY WITH STONE RETREIVAL RIGHT RETROGRADE PYELOGRAM;  Surgeon:  Cleon Gustin, MD;  Location: [facility] ORS;  Service: Urology;  Laterality: Right;  . KIDNEY STONE SURGERY    . LOWER EXTREMITY ANGIOGRAM N/A 08/15/2012   Procedure: LOWER EXTREMITY ANGIOGRAM with possible PTA /stent;  Surgeon: Jettie Booze, MD;  Location: [facility] CATH LAB;  Service: Cardiovascular;  Laterality: N/A;   Family History:  Family History  Problem Relation Age of Onset  . Heart disease Father   . Hyperlipidemia Father   . Heart disease Maternal Grandmother    Family Psychiatric  History: Denies  Social History:  History  Alcohol Use  . Yes    Comment: last drink 03/07/2015      History  Drug Use  . Types: Cocaine, Marijuana    Comment: 10/23//2016    Social History   Social History  . Marital status: Divorced    Spouse name: N/A  . Number of children: N/A  . Years of education: N/A   Social History Main Topics  . Smoking status: Current Every Day Smoker    Packs/day: 1.00    Years: 31.00    Types: Cigarettes  . Smokeless tobacco: Former Systems developer    Quit date: 04/27/2013  . Alcohol use Yes     Comment: last drink 03/07/2015   . Drug use:     Types: Cocaine, Marijuana     Comment: 10/23//2016  .  Sexual activity: Yes    Birth control/ protection: Condom   Other Topics Concern  . None   Social History Narrative  . None   Additional Social History:    Allergies:  No Known Allergies  Lab Results:  Results for orders placed or performed during the hospital encounter of 02/28/16 (from the past 48 hour(s))  Rapid urine drug screen (hospital performed)     Status: Abnormal   Collection Time: 02/28/16  8:34 AM  Result Value Ref Range   Opiates POSITIVE (A) NONE DETECTED   Cocaine POSITIVE (A) NONE DETECTED   Benzodiazepines POSITIVE (A) NONE DETECTED   Amphetamines NONE DETECTED NONE DETECTED   Tetrahydrocannabinol NONE DETECTED NONE DETECTED   Barbiturates NONE DETECTED NONE DETECTED    Comment:        DRUG SCREEN FOR MEDICAL PURPOSES ONLY.  IF  CONFIRMATION IS NEEDED FOR ANY PURPOSE, NOTIFY LAB WITHIN 5 DAYS.        LOWEST DETECTABLE LIMITS FOR URINE DRUG SCREEN Drug Class       Cutoff (ng/mL) Amphetamine      1000 Barbiturate      200 Benzodiazepine   426 Tricyclics       834 Opiates          300 Cocaine          300 THC              50   Comprehensive metabolic panel     Status: Abnormal   Collection Time: 02/28/16  8:37 AM  Result Value Ref Range   Sodium 136 135 - 145 mmol/L   Potassium 3.5 3.5 - 5.1 mmol/L   Chloride 98 (L) 101 - 111 mmol/L   CO2 28 22 - 32 mmol/L   Glucose, Bld 107 (H) 65 - 99 mg/dL   BUN 17 6 - 20 mg/dL   Creatinine, Ser 1.35 (H) 0.61 - 1.24 mg/dL   Calcium 10.2 8.9 - 10.3 mg/dL   Total Protein 7.9 6.5 - 8.1 g/dL   Albumin 4.5 3.5 - 5.0 g/dL   AST 39 15 - 41 U/L   ALT 35 17 - 63 U/L   Alkaline Phosphatase 87 38 - 126 U/L   Total Bilirubin 1.0 0.3 - 1.2 mg/dL   GFR calc non Af Amer >60 >60 mL/min   GFR calc Af Amer >60 >60 mL/min    Comment: (NOTE) The eGFR has been calculated using the CKD EPI equation. This calculation has not been validated in all clinical situations. eGFR's persistently <60 mL/min signify possible Chronic Kidney Disease.    Anion gap 10 5 - 15  Ethanol     Status: None   Collection Time: 02/28/16  8:37 AM  Result Value Ref Range   Alcohol, Ethyl (B) <5 <5 mg/dL    Comment:        LOWEST DETECTABLE LIMIT FOR SERUM ALCOHOL IS 5 mg/dL FOR MEDICAL PURPOSES ONLY   Salicylate level     Status: None   Collection Time: 02/28/16  8:37 AM  Result Value Ref Range   Salicylate Lvl <1.9 2.8 - 30.0 mg/dL  Acetaminophen level     Status: Abnormal   Collection Time: 02/28/16  8:37 AM  Result Value Ref Range   Acetaminophen (Tylenol), Serum <10 (L) 10 - 30 ug/mL    Comment:        THERAPEUTIC CONCENTRATIONS VARY SIGNIFICANTLY. A RANGE OF 10-30 ug/mL MAY BE AN EFFECTIVE CONCENTRATION FOR MANY PATIENTS. HOWEVER, SOME  ARE BEST TREATED AT CONCENTRATIONS OUTSIDE  THIS RANGE. ACETAMINOPHEN CONCENTRATIONS >150 ug/mL AT 4 HOURS AFTER INGESTION AND >50 ug/mL AT 12 HOURS AFTER INGESTION ARE OFTEN ASSOCIATED WITH TOXIC REACTIONS.   cbc     Status: None   Collection Time: 02/28/16  8:37 AM  Result Value Ref Range   WBC 7.9 4.0 - 10.5 K/uL   RBC 4.75 4.22 - 5.81 MIL/uL   Hemoglobin 15.4 13.0 - 17.0 g/dL   HCT 51.8 60.2 - 62.6 %   MCV 91.6 78.0 - 100.0 fL   MCH 32.4 26.0 - 34.0 pg   MCHC 35.4 30.0 - 36.0 g/dL   RDW 92.3 90.7 - 49.7 %   Platelets 309 150 - 400 K/uL   Metabolic Disorder Labs:  Lab Results  Component Value Date   HGBA1C 6.5 (H) 04/06/2015   MPG 140 04/06/2015   MPG 97 04/27/2013   Lab Results  Component Value Date   PROLACTIN 18.2 (H) 04/06/2015   Lab Results  Component Value Date   CHOL 284 (H) 04/06/2015   TRIG 378 (H) 04/06/2015   HDL 43 04/06/2015   CHOLHDL 6.6 04/06/2015   VLDL 76 (H) 04/06/2015   LDLCALC 165 (H) 04/06/2015   LDLCALC 114 (H) 02/10/2015   Current Medications: Current Facility-Administered Medications  Medication Dose Route Frequency Provider Last Rate Last Dose  . acetaminophen (TYLENOL) tablet 650 mg  650 mg Oral Q4H PRN Charm Rings, NP   650 mg at 02/29/16 0850  . alum & mag hydroxide-simeth (MAALOX/MYLANTA) 200-200-20 MG/5ML suspension 30 mL  30 mL Oral PRN Charm Rings, NP      . ARIPiprazole (ABILIFY) tablet 5 mg  5 mg Oral Daily Charm Rings, NP   5 mg at 02/29/16 0757  . busPIRone (BUSPAR) tablet 5 mg  5 mg Oral BID Thermon Leyland, NP      . FLUoxetine (PROZAC) capsule 20 mg  20 mg Oral Daily Charm Rings, NP   20 mg at 02/29/16 0757  . hydrOXYzine (ATARAX/VISTARIL) tablet 50 mg  50 mg Oral Q6H PRN Thermon Leyland, NP      . ibuprofen (ADVIL,MOTRIN) tablet 600 mg  600 mg Oral Q8H PRN Charm Rings, NP      . magnesium hydroxide (MILK OF MAGNESIA) suspension 30 mL  30 mL Oral Daily PRN Charm Rings, NP      . nicotine (NICODERM CQ - dosed in mg/24 hours) patch 21 mg  21 mg  Transdermal Daily Nelly Rout, MD   21 mg at 02/29/16 0757  . ondansetron (ZOFRAN) tablet 4 mg  4 mg Oral Q8H PRN Charm Rings, NP   4 mg at 02/29/16 0811  . traZODone (DESYREL) tablet 100 mg  100 mg Oral QHS Thermon Leyland, NP       PTA Medications: Prescriptions Prior to Admission  Medication Sig Dispense Refill Last Dose  . ARIPiprazole (ABILIFY) 5 MG tablet Take 1 tablet (5 mg total) by mouth daily. (Patient not taking: Reported on 02/29/2016) 14 tablet 0 Not Taking at Unknown time  . atorvastatin (LIPITOR) 20 MG tablet Take 1 tablet (20 mg total) by mouth daily at 6 PM. For high cholesterol (Patient not taking: Reported on 02/29/2016) 30 tablet 0 Not Taking at Unknown time  . azithromycin (ZITHROMAX) 250 MG tablet Take 1 tab daily starting 02/24/16 (Patient not taking: Reported on 02/29/2016) 2 each 0 Not Taking at Unknown time  . FLUoxetine (PROZAC) 20  MG capsule Take 1 capsule (20 mg total) by mouth daily. (Patient not taking: Reported on 02/29/2016) 14 capsule 0 Not Taking at Unknown time  . hydrOXYzine (ATARAX/VISTARIL) 25 MG tablet Take 1 tablet (25 mg total) by mouth every 6 (six) hours as needed for anxiety. (Patient not taking: Reported on 02/29/2016) 30 tablet 0 Not Taking at Unknown time  . metFORMIN (GLUCOPHAGE) 500 MG tablet Take 1 tablet (500 mg total) by mouth daily with breakfast. For diabetes management (Patient not taking: Reported on 02/29/2016) 30 tablet 0 Not Taking at Unknown time  . nicotine (NICODERM CQ - DOSED IN MG/24 HOURS) 21 mg/24hr patch Place 1 patch (21 mg total) onto the skin daily. (Patient not taking: Reported on 02/29/2016) 28 patch 0 Not Taking at Unknown time  . traZODone (DESYREL) 50 MG tablet Take 1 tablet (50 mg total) by mouth at bedtime as needed for sleep. (Patient not taking: Reported on 02/29/2016) 14 tablet 0 Not Taking at Unknown time   Musculoskeletal: Strength & Muscle Tone: within normal limits Gait & Station: normal Patient leans:  Right  Psychiatric Specialty Exam: Physical Exam  Constitutional:  Patient was found to be medically clear on 02/28/2016 at the [facility]   Genitourinary:  Genitourinary Comments: Denies any issues in this area  Psychiatric: His speech is normal. His mood appears anxious. His affect is not angry, not blunt, not labile and not inappropriate. Cognition and memory are normal. He exhibits a depressed mood.    Review of Systems  Constitutional: Positive for malaise/fatigue. Negative for diaphoresis.  HENT: Negative.   Eyes: Negative.   Respiratory: Negative.   Cardiovascular: Negative for chest pain, palpitations and orthopnea.       Elevated blood pressure  Gastrointestinal: Positive for nausea and vomiting.  Genitourinary: Negative.   Musculoskeletal: Negative.   Skin: Negative.   Neurological: Positive for weakness.  Endo/Heme/Allergies: Negative.   Psychiatric/Behavioral: Positive for depression, substance abuse (Alcohol use disorder) and suicidal ideas (Reports plan to overdose or wreck motorcycle ). Negative for hallucinations and memory loss. The patient is nervous/anxious and has insomnia.   All other systems reviewed and are negative.   Blood pressure 108/68, pulse (!) 102, temperature 98.5 F (36.9 C), temperature source Oral, resp. rate 16, height '5\' 10"'$  (1.778 m), weight 130 kg (286 lb 9.6 oz).Body mass index is 41.12 kg/m.  General Appearance: Disheveled  Eye Sport and exercise psychologist::  Fair  Speech:  Clear and Coherent and Normal Rate  Volume:  Normal  Mood:  Anxious and Irritable  Affect:  Depressed  Thought Process:  Coherent, Goal Directed and Logical  Orientation:  Full (Time, Place, and Person)  Thought Content:  Rumination, denies any hallucinations.  Suicidal Thoughts:  Yes.  with intent/plan  Homicidal Thoughts:  No  Memory:  Immediate;   Good Recent;   Good Remote;   Good  Judgement:  Fair  Insight:  Fair  Psychomotor Activity:  Increased  Concentration:  Fair  Recall:   [place] of Knowledge:Fair  Language: Good  Akathisia:  No  Handed:  Right  AIMS (if indicated):     Assets:  Communication Skills Desire for Improvement Resilience  ADL's:  Intact  Cognition: WNL  Sleep: 4.6   Observation Level/Precautions:  Continuous Observation  Laboratory:  Per ED  Psychotherapy: Individual for substance abuse   Medications: See Treatment Plan  Consultations: As needed  Discharge Concerns:  Safety, sobriety  Estimated LOS: 24-48 hours  Other:    Treatment Plan/Recommendations: 1. Admit for  crisis management and stabilization to [facility], estimated length of stay 24-48 hours.  2. Medication management to reduce current symptoms to base line and improve the patient's overall level of functioning;  Fluoxetine 20 mg for depression,  Abilify 5 mg daily for mood stabilization, Buspar 5 mg BID for anxiety, Trazodone 100 mg hs for insomnia, Librium 25 mg every six hours prn symptoms of alcohol or benzo withdrawal 3. Treat health problems as indicated. 4. Develop treatment plan to decrease risk of relapse upon discharge and the need for readmission.  5. Psycho-social education regarding relapse prevention and self care.  6. Health care follow up as needed for medical problems.  7. Review, reconcile, and reinstate any pertinent home medications for other health issues where appropriate;  8. Call for consults with hospitalist for any additional specialty patient care services as needed.  Elmarie Shiley, PMHNP - BC 10/17/20179:31 AM

## 2016-03-01 DIAGNOSIS — F1414 Cocaine abuse with cocaine-induced mood disorder: Secondary | ICD-10-CM | POA: Diagnosis not present

## 2016-03-01 DIAGNOSIS — F17210 Nicotine dependence, cigarettes, uncomplicated: Secondary | ICD-10-CM | POA: Diagnosis not present

## 2016-03-01 DIAGNOSIS — Z8249 Family history of ischemic heart disease and other diseases of the circulatory system: Secondary | ICD-10-CM | POA: Diagnosis not present

## 2016-03-01 MED ORDER — ARIPIPRAZOLE 5 MG PO TABS: 5.0000 mg | ORAL_TABLET | Freq: Every day | ORAL | Status: DC

## 2016-03-01 MED ORDER — HYDROXYZINE HCL 50 MG PO TABS: 50.0000 mg | ORAL_TABLET | Freq: Four times a day (QID) | ORAL | 0 refills | Status: DC | PRN

## 2016-03-01 MED ORDER — BUSPIRONE HCL 5 MG PO TABS: 5.0000 mg | ORAL_TABLET | Freq: Two times a day (BID) | ORAL | 0 refills | Status: DC

## 2016-03-01 MED ORDER — TRAZODONE HCL 100 MG PO TABS: 100.0000 mg | ORAL_TABLET | Freq: Every day | ORAL | 0 refills | Status: DC

## 2016-03-01 MED ORDER — NICOTINE 21 MG/24HR TD PT24: 21.0000 mg | MEDICATED_PATCH | Freq: Every day | TRANSDERMAL | 0 refills | Status: DC

## 2016-03-01 MED ORDER — FLUOXETINE HCL 20 MG PO CAPS: 20.0000 mg | ORAL_CAPSULE | Freq: Every day | ORAL | 0 refills | Status: DC

## 2016-03-01 NOTE — Progress Notes (Signed)
D:  Patient awake and alert; he denies suicidal and homicidal ideation and AVH; no self-injurious behaviors noted or reported.  He c/o being anxious A:  Scheduled medications and medication for anxiety given (See MAR); emotional support provided; encouraged him to seek assistance with needs/concerns. R:  Safety maintained on unit.

## 2016-03-01 NOTE — Progress Notes (Signed)
[facility] OBSERVATION UNIT:  Family/Significant Other Suicide Prevention Education  Suicide Prevention Education:  Patient Refusal for Family/Significant Other Suicide Prevention Education: The patient Steven [name] has refused to provide written consent for family/significant other to be provided Family/Significant Other Suicide Prevention Education during admission and/or prior to discharge.  Physician notified.  Steven [name] 03/01/2016, 9:40 AM   Steven EngKaren H Manie Bealer, RN 03/01/16  9:40 AM

## 2016-03-01 NOTE — Discharge Planning (Signed)
[facility] :  Will you be returning to the same living situation after discharge:  [facility]  At discharge, do you have transportation home?: Yes,  Pt has motorcycle  Do you have the ability to pay for your medications: [facility] accepts uninsured  Release of information consent forms completed and in the chart;  Patient's signature needed at discharge.  Patient to Follow up at: Follow-up Information    [facility]. Go on 03/02/2016.   Why:  Pt is expected to report to the above mentioned facility at 11:45am to complete clinical intake to determine if he meets inpatient criteria. Contact information: [address] [place] [address] [phone]           Safety Planning and Suicide Prevention discussed: Yes,  Patient verbalized understanding   Camelia EngKaren H Grazia Taffe 03/01/2016, 9:37 AM

## 2016-03-01 NOTE — Progress Notes (Signed)
Written/verbal discharge instructions,sample medications and follow-up appointments given to patient with verbalization of understanding;  Patient denies suicidal and homicidal ideation. Suicide Prevention information/materials given to patient  All patient belongings returned to patient at time of discharge. Discharged in stable condition.

## 2016-03-01 NOTE — Discharge Summary (Signed)
[facility] Discharge Summary Note  Patient:  Steven [name] is an 45 y.o., male MRN:  [id number] DOB:  1971-05-15 Patient phone:  [phone] (home)  Patient address:   [place] [address],  Total Time spent with patient: 30 minutes  Date of Admission:  02/28/2016 Date of Discharge: 03/01/2016  Reason for Admission: Cocaine abuse with cocaine induced mood disorder  Principal Problem: Cocaine abuse with cocaine-induced mood disorder [facility]) Discharge Diagnoses: Polysubstance abuse  Patient Active Problem List   Diagnosis Date Noted  . Polysubstance abuse [F19.10] 02/23/2016    Priority: High  . Opiate dependence (HCC) [F11.20] 02/23/2016    Priority: High  . Cocaine abuse with cocaine-induced mood disorder (HCC) [F14.14] 02/21/2016    Priority: High  . MDD (major depressive disorder), recurrent severe, without psychosis (HCC) [F33.2] 06/18/2015    Priority: High  . Substance induced mood disorder (HCC) [F19.94] 03/08/2015    Priority: High  . Suicidal ideation [R45.851]   . COPD (chronic obstructive pulmonary disease) (HCC) [J44.9] 02/09/2015  . Tobacco use disorder [F17.200] 02/09/2015  . Stimulant use disorder (cocaine) [F15.90] 02/09/2015  . Alcohol use disorder, severe, in sustained remission (HCC) [F10.21] 02/09/2015  . Ureteral stone [N20.1] 12/31/2014  . 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 CAD s/p CABG 2012 [facility] [I25.810] 11/28/2011  . Hyperlipidemia LDL goal <100 [E78.5] 03/13/2007  . Essential hypertension [I10] 03/13/2007  . GERD [K21.9] 03/13/2007    Past Psychiatric History: Bi Polar Depression, substance abuse, suicidal ideations  Past Medical History:  Past Medical History:  Diagnosis Date  . Asthma   . Bipolar disorder (manic depression) (HCC)   . COPD (chronic obstructive pulmonary disease) (HCC)   . Coronary artery disease   . Hx of CABG   . Hypercholesteremia   . Hypertension   . Kidney  stone   . Tobacco use disorder     Past Surgical History:  Procedure Laterality Date  . ABDOMINAL ANGIOGRAM  08/15/2012   Procedure: ABDOMINAL ANGIOGRAM;  Surgeon: Corky Crafts, MD;  Location: [facility] CATH LAB;  Service: Cardiovascular;;  . APPENDECTOMY    . CARDIOVERSION N/A 08/28/2012   Procedure: Pseudo Compression;  Surgeon: Chuck Hint, MD;  Location: [facility] CATH LAB;  Service: Cardiovascular;  Laterality: N/A;  . CORONARY ARTERY BYPASS GRAFT    . CYSTOSCOPY WITH RETROGRADE PYELOGRAM, URETEROSCOPY AND STENT PLACEMENT Right 12/31/2014   Procedure: CYSTOSCOPY  RIGHT URETEROSCOPY WITH STONE RETREIVAL RIGHT RETROGRADE PYELOGRAM;  Surgeon: Malen Gauze, MD;  Location: [facility] ORS;  Service: Urology;  Laterality: Right;  . KIDNEY STONE SURGERY    . LOWER EXTREMITY ANGIOGRAM N/A 08/15/2012   Procedure: LOWER EXTREMITY ANGIOGRAM with possible PTA /stent;  Surgeon: Corky Crafts, MD;  Location: [facility] CATH LAB;  Service: Cardiovascular;  Laterality: N/A;   Family History:  Family History  Problem Relation Age of Onset  . Heart disease Father   . Hyperlipidemia Father   . Heart disease Maternal Grandmother    Family Psychiatric  History: MDD Social History:  History  Alcohol Use  . Yes    Comment: last drink 03/07/2015      History  Drug Use  . Types: Cocaine, Marijuana    Comment: 10/23//2016    Social History   Social History  . Marital status: Divorced    Spouse name: N/A  . Number of children: N/A  . Years of education: N/A   Social History Main Topics  . Smoking status:  Current Every Day Smoker    Packs/day: 1.00    Years: 31.00    Types: Cigarettes  . Smokeless tobacco: Former NeurosurgeonUser    Quit date: 04/27/2013  . Alcohol use Yes     Comment: last drink 03/07/2015   . Drug use:     Types: Cocaine, Marijuana     Comment: 10/23//2016  . Sexual activity: Yes    Birth control/ protection: Condom   Other Topics Concern  . None   Social History Narrative  .  None    Hospital Course:    Physical Findings: AIMS: Facial and Oral Movements Muscles of Facial Expression: None, normal Lips and Perioral Area: None, normal Jaw: None, normal Tongue: None, normal,Extremity Movements Upper (arms, wrists, hands, fingers): None, normal Lower (legs, knees, ankles, toes): None, normal, Trunk Movements Neck, shoulders, hips: None, normal, Overall Severity Severity of abnormal movements (highest score from questions above): None, normal Incapacitation due to abnormal movements: None, normal Patient's awareness of abnormal movements (rate only patient's report): No Awareness, Dental Status Current problems with teeth and/or dentures?: No Does patient usually wear dentures?: No  CIWA:  CIWA-Ar Total: 2 COWS:     Musculoskeletal: Strength & Muscle Tone: within normal limits Gait & Station: normal Patient leans: N/A  Psychiatric Specialty Exam: Physical Exam  ROS  Blood pressure 128/71, pulse (!) 105, temperature 98.6 F (37 C), temperature source Oral, resp. rate 16, height 5\' 10"  (1.778 m), weight 130 kg (286 lb 9.6 oz).Body mass index is 41.12 kg/m.  General Appearance: Casual and Fairly Groomed  Eye Contact:  Good  Speech:  Clear and Coherent and Normal Rate  Volume:  Normal  Mood:  Anxious and Depressed  Affect:  Appropriate, Congruent and Flat  Thought Process:  Coherent, Goal Directed and Linear  Orientation:  Full (Time, Place, and Person)  Thought Content:  Logical  Suicidal Thoughts:  No  Homicidal Thoughts:  No  Memory:  Immediate;   Good Recent;   Good Remote;   Fair  Judgement:  Fair  Insight:  Fair  Psychomotor Activity:  Normal  Concentration:  Concentration: Good and Attention Span: Good  Recall:  [employer]Fair  Fund of Knowledge:  Fair  Language:  Good  Akathisia:  No  Handed:  Right  AIMS (if indicated):     Assets:  Communication Skills Desire for Improvement Resilience Transportation  ADL's:  Intact  Cognition:  WNL   Sleep:   fair    Discharge destination:  Home, with appointment for 11:45AM on 03/02/2016 at [facility] for clinical intake      Medication List    STOP taking these medications   atorvastatin 20 MG tablet Commonly known as:  LIPITOR   azithromycin 250 MG tablet Commonly known as:  ZITHROMAX   metFORMIN 500 MG tablet Commonly known as:  GLUCOPHAGE     TAKE these medications     Indication  ARIPiprazole 5 MG tablet Commonly known as:  ABILIFY Take 1 tablet (5 mg total) by mouth daily. Start taking on:  03/02/2016  Indication:  Major Depressive Disorder   busPIRone 5 MG tablet Commonly known as:  BUSPAR Take 1 tablet (5 mg total) by mouth 2 (two) times daily.  Indication:  Symptoms of Feeling Anxious   FLUoxetine 20 MG capsule Commonly known as:  PROZAC Take 1 capsule (20 mg total) by mouth daily. Start taking on:  03/02/2016  Indication:  Depression   hydrOXYzine 50 MG tablet Commonly known as:  ATARAX/VISTARIL Take 1  tablet (50 mg total) by mouth every 6 (six) hours as needed for anxiety. What changed:  medication strength  how much to take  Indication:  Anxiety Neurosis   nicotine 21 mg/24hr patch Commonly known as:  NICODERM CQ - dosed in mg/24 hours Place 1 patch (21 mg total) onto the skin daily. Start taking on:  03/02/2016  Indication:  Nicotine Addiction   traZODone 100 MG tablet Commonly known as:  DESYREL Take 1 tablet (100 mg total) by mouth at bedtime. What changed:  medication strength  how much to take  when to take this  reasons to take this  Indication:  Trouble Sleeping      Follow-up Information    [facility]. Go on 03/02/2016.   Why:  Pt is expected to report to the above mentioned facility at 11:45am to complete clinical intake to determine if he meets inpatient criteria. Contact information: [address] [place] [address] [phone]          Treatment Plan:    Discharge home today with  follow-up appointment in place at [facility] for clinical intake and possible residential placement for assistance with substance abuse.    14 days of medication samples were provided to patient.  Medications:  Buspar 5 mg BID Trazadone 50 mg QHS Vistaril 25 mg q6hrs PRN Abilify 5 mg QD Prozac 20 mg QD  Disposition: Discharge home with follow up services in place at [facility] on 03/02/2016 for clinical intake   Signed: Laveda Abbe, NP 03/01/2016, 4:55 PM

## 2016-03-19 ENCOUNTER — Encounter (HOSPITAL_COMMUNITY): Payer: Self-pay | Admitting: Emergency Medicine

## 2016-03-19 ENCOUNTER — Emergency Department (HOSPITAL_COMMUNITY)
Admission: EM | Admit: 2016-03-19 | Discharge: 2016-03-19 | Disposition: A | Discharge status: Other Acute Inpatient Hospital | Payer: Self-pay | Attending: Physician Assistant | Admitting: Physician Assistant

## 2016-03-19 ENCOUNTER — Observation Stay (HOSPITAL_COMMUNITY)
Admission: AD | Admit: 2016-03-19 | Discharge: 2016-03-20 | Disposition: A | Discharge status: Home / Self Care | Payer: No Typology Code available for payment source | Source: Intra-hospital | Attending: Psychiatry | Admitting: Psychiatry

## 2016-03-19 ENCOUNTER — Encounter (HOSPITAL_COMMUNITY): Payer: Self-pay | Admitting: *Deleted

## 2016-03-19 DIAGNOSIS — F1414 Cocaine abuse with cocaine-induced mood disorder: Secondary | ICD-10-CM | POA: Diagnosis present

## 2016-03-19 DIAGNOSIS — J449 Chronic obstructive pulmonary disease, unspecified: Secondary | ICD-10-CM | POA: Insufficient documentation

## 2016-03-19 DIAGNOSIS — F332 Major depressive disorder, recurrent severe without psychotic features: Secondary | ICD-10-CM | POA: Diagnosis not present

## 2016-03-19 DIAGNOSIS — Z8249 Family history of ischemic heart disease and other diseases of the circulatory system: Secondary | ICD-10-CM | POA: Diagnosis not present

## 2016-03-19 DIAGNOSIS — I10 Essential (primary) hypertension: Secondary | ICD-10-CM | POA: Insufficient documentation

## 2016-03-19 DIAGNOSIS — Z8489 Family history of other specified conditions: Secondary | ICD-10-CM

## 2016-03-19 DIAGNOSIS — F1994 Other psychoactive substance use, unspecified with psychoactive substance-induced mood disorder: Secondary | ICD-10-CM | POA: Diagnosis present

## 2016-03-19 DIAGNOSIS — R45851 Suicidal ideations: Secondary | ICD-10-CM

## 2016-03-19 DIAGNOSIS — Z79899 Other long term (current) drug therapy: Secondary | ICD-10-CM

## 2016-03-19 DIAGNOSIS — Z951 Presence of aortocoronary bypass graft: Secondary | ICD-10-CM | POA: Insufficient documentation

## 2016-03-19 DIAGNOSIS — I2510 Atherosclerotic heart disease of native coronary artery without angina pectoris: Secondary | ICD-10-CM | POA: Insufficient documentation

## 2016-03-19 DIAGNOSIS — J45909 Unspecified asthma, uncomplicated: Secondary | ICD-10-CM | POA: Insufficient documentation

## 2016-03-19 DIAGNOSIS — F419 Anxiety disorder, unspecified: Secondary | ICD-10-CM | POA: Insufficient documentation

## 2016-03-19 DIAGNOSIS — F1910 Other psychoactive substance abuse, uncomplicated: Principal | ICD-10-CM | POA: Insufficient documentation

## 2016-03-19 DIAGNOSIS — F17210 Nicotine dependence, cigarettes, uncomplicated: Secondary | ICD-10-CM | POA: Insufficient documentation

## 2016-03-19 DIAGNOSIS — Z5181 Encounter for therapeutic drug level monitoring: Secondary | ICD-10-CM | POA: Insufficient documentation

## 2016-03-19 LAB — CBC
HCT: 47.2 % (ref 39.0–52.0)
Hemoglobin: 16.4 g/dL (ref 13.0–17.0)
MCH: 33.1 pg (ref 26.0–34.0)
MCHC: 34.7 g/dL (ref 30.0–36.0)
MCV: 95.4 fL (ref 78.0–100.0)
PLATELETS: 278 10*3/uL (ref 150–400)
RBC: 4.95 MIL/uL (ref 4.22–5.81)
RDW: 15.3 % (ref 11.5–15.5)
WBC: 4 10*3/uL (ref 4.0–10.5)

## 2016-03-19 LAB — RAPID URINE DRUG SCREEN, HOSP PERFORMED
Amphetamines: NOT DETECTED
BENZODIAZEPINES: NOT DETECTED
Barbiturates: NOT DETECTED
COCAINE: POSITIVE — AB
OPIATES: NOT DETECTED
Tetrahydrocannabinol: NOT DETECTED

## 2016-03-19 LAB — COMPREHENSIVE METABOLIC PANEL
ALBUMIN: 4.3 g/dL (ref 3.5–5.0)
ALT: 42 U/L (ref 17–63)
ANION GAP: 10 (ref 5–15)
AST: 41 U/L (ref 15–41)
Alkaline Phosphatase: 75 U/L (ref 38–126)
BILIRUBIN TOTAL: 0.8 mg/dL (ref 0.3–1.2)
BUN: 14 mg/dL (ref 6–20)
CO2: 25 mmol/L (ref 22–32)
Calcium: 9.5 mg/dL (ref 8.9–10.3)
Chloride: 103 mmol/L (ref 101–111)
Creatinine, Ser: 0.99 mg/dL (ref 0.61–1.24)
GFR calc Af Amer: >60 mL/min (ref >60)
GFR calc non Af Amer: >60 mL/min (ref >60)
GLUCOSE: 103 mg/dL — AB (ref 65–99)
POTASSIUM: 3.9 mmol/L (ref 3.5–5.1)
Sodium: 138 mmol/L (ref 135–145)
TOTAL PROTEIN: 8.1 g/dL (ref 6.5–8.1)

## 2016-03-19 LAB — ETHANOL: ALCOHOL ETHYL (B): 6 mg/dL — AB (ref <5)

## 2016-03-19 LAB — SALICYLATE LEVEL

## 2016-03-19 LAB — ACETAMINOPHEN LEVEL

## 2016-03-19 MED ORDER — TRAZODONE HCL 100 MG PO TABS
100.0000 mg | ORAL_TABLET | Freq: Every day | ORAL | Status: DC
  Administered 2016-03-19: 100 mg via ORAL
  Filled 2016-03-19: qty 1

## 2016-03-19 MED ORDER — IBUPROFEN 600 MG PO TABS
600.0000 mg | ORAL_TABLET | Freq: Three times a day (TID) | ORAL | Status: DC | PRN
  Administered 2016-03-19 – 2016-03-20 (×2): 600 mg via ORAL
  Filled 2016-03-19 (×2): qty 1

## 2016-03-19 MED ORDER — NICOTINE 21 MG/24HR TD PT24
21.0000 mg | MEDICATED_PATCH | Freq: Every day | TRANSDERMAL | Status: DC
  Administered 2016-03-20: 21 mg via TRANSDERMAL
  Filled 2016-03-19: qty 1

## 2016-03-19 MED ORDER — ONDANSETRON HCL 4 MG PO TABS: 4.0000 mg | ORAL_TABLET | Freq: Three times a day (TID) | ORAL | Status: DC | PRN

## 2016-03-19 MED ORDER — LORAZEPAM 1 MG PO TABS: 0.0000 mg | ORAL_TABLET | Freq: Four times a day (QID) | ORAL | Status: DC

## 2016-03-19 MED ORDER — INFLUENZA VAC SPLIT QUAD 0.5 ML IM SUSY
0.5000 mL | PREFILLED_SYRINGE | INTRAMUSCULAR | Status: DC
  Filled 2016-03-19: qty 0.5

## 2016-03-19 MED ORDER — FLUOXETINE HCL 20 MG PO CAPS
20.0000 mg | ORAL_CAPSULE | Freq: Every day | ORAL | Status: DC
  Administered 2016-03-19 – 2016-03-20 (×2): 20 mg via ORAL
  Filled 2016-03-19 (×2): qty 1

## 2016-03-19 MED ORDER — MAGNESIUM HYDROXIDE 400 MG/5ML PO SUSP: 30.0000 mL | Freq: Every day | ORAL | Status: DC | PRN

## 2016-03-19 MED ORDER — THIAMINE HCL 100 MG/ML IJ SOLN: 100.0000 mg | Freq: Every day | INTRAMUSCULAR | Status: DC

## 2016-03-19 MED ORDER — IBUPROFEN 200 MG PO TABS: 600.0000 mg | ORAL_TABLET | Freq: Three times a day (TID) | ORAL | Status: DC | PRN

## 2016-03-19 MED ORDER — ALUM & MAG HYDROXIDE-SIMETH 200-200-20 MG/5ML PO SUSP: 30.0000 mL | ORAL | Status: DC | PRN

## 2016-03-19 MED ORDER — HYDROXYZINE HCL 50 MG PO TABS
50.0000 mg | ORAL_TABLET | Freq: Four times a day (QID) | ORAL | Status: DC | PRN
  Administered 2016-03-19 – 2016-03-20 (×2): 50 mg via ORAL
  Filled 2016-03-19 (×2): qty 1

## 2016-03-19 MED ORDER — LORAZEPAM 1 MG PO TABS
1.0000 mg | ORAL_TABLET | Freq: Four times a day (QID) | ORAL | Status: DC | PRN
  Administered 2016-03-19 – 2016-03-20 (×2): 1 mg via ORAL
  Filled 2016-03-19 (×2): qty 1

## 2016-03-19 MED ORDER — LORAZEPAM 1 MG PO TABS: 0.0000 mg | ORAL_TABLET | Freq: Two times a day (BID) | ORAL | Status: DC

## 2016-03-19 MED ORDER — ONDANSETRON HCL 4 MG PO TABS
4.0000 mg | ORAL_TABLET | Freq: Three times a day (TID) | ORAL | Status: DC | PRN
  Administered 2016-03-19: 4 mg via ORAL
  Filled 2016-03-19: qty 1

## 2016-03-19 MED ORDER — BUSPIRONE HCL 5 MG PO TABS
5.0000 mg | ORAL_TABLET | Freq: Two times a day (BID) | ORAL | Status: DC
  Administered 2016-03-19 – 2016-03-20 (×2): 5 mg via ORAL
  Filled 2016-03-19 (×2): qty 1

## 2016-03-19 MED ORDER — NICOTINE 21 MG/24HR TD PT24
21.0000 mg | MEDICATED_PATCH | Freq: Every day | TRANSDERMAL | Status: DC
  Administered 2016-03-19: 21 mg via TRANSDERMAL
  Filled 2016-03-19: qty 1

## 2016-03-19 MED ORDER — ARIPIPRAZOLE 5 MG PO TABS
5.0000 mg | ORAL_TABLET | Freq: Every day | ORAL | Status: DC
  Administered 2016-03-19 – 2016-03-20 (×2): 5 mg via ORAL
  Filled 2016-03-19 (×2): qty 1

## 2016-03-19 MED ORDER — ZOLPIDEM TARTRATE 5 MG PO TABS: 5.0000 mg | ORAL_TABLET | Freq: Every evening | ORAL | Status: DC | PRN

## 2016-03-19 MED ORDER — VITAMIN B-1 100 MG PO TABS
100.0000 mg | ORAL_TABLET | Freq: Every day | ORAL | Status: DC
  Administered 2016-03-19: 100 mg via ORAL
  Filled 2016-03-19: qty 1

## 2016-03-19 MED ORDER — LORAZEPAM 1 MG PO TABS
ORAL_TABLET | ORAL | Status: AC
  Filled 2016-03-19: qty 1

## 2016-03-19 MED ORDER — ACETAMINOPHEN 325 MG PO TABS: 650.0000 mg | ORAL_TABLET | ORAL | Status: DC | PRN

## 2016-03-19 NOTE — ED Notes (Signed)
[facility] now [facility] bedside [facility]
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
## 2016-03-19 NOTE — ED Notes (Signed)
[facility] has been contacted

## 2016-03-19 NOTE — Progress Notes (Signed)
Pt is familiar to this Clinical research associatewriter.  Pt is awake and oriented and sts he continues to drink a case of beer per day.  Pt denies any HI or AVH at this time and SI is passive.  Pt admits to daily cocaine use daily.  Pt requesting meds for withdrawal and snacks and drinks. Pt given drinks and snacks and medication for withdrawal. Pt is continuously observed for safety while on the unit except when in the bathroom.  Pt remains safe.
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
## 2016-03-19 NOTE — ED Notes (Addendum)
Patient using hospital telephone.

## 2016-03-19 NOTE — Progress Notes (Signed)
CSW spoke with patient at bedside, to obtain signature for voluntary admission and consent for treatment in the observation unit. Patient signed form. CSW inquired if patient had any further questions, patient replied no. CSW provided signed form to patient's RN.

## 2016-03-19 NOTE — BH Assessment (Signed)
Tele Assessment Note  Writer wakes up pt for assessment. Pt is wearing scrubs and his affect is depressed. He is oriented x 4. He reports SI with plan to wreck his motorcycle or kill himself in another unspecified way. His depressive symptoms include irritability, worthlessness, anhedonia, guilt and fatigue. Pt reports having daily SI for years. He says, "I'm so tired of it." He reports short term and long term memory impairment. Pt reports drinking a case of beer daily and he drank one case yesterday. He sts he uses crack twice a week. Per chart review, [facility] - most recently discharged from OBS on 02/29/16. Pt reports he hasn't abused morphine in a long time. Pt reports "several" prior suicide attempts with most recent attempt 2 yrs ago. Pt denies homicidal thoughts or physical aggression. Pt denies having access to firearms. Pt denies having any legal problems at this time. Pt is calm and cooperative during assessment. Pt denies hallucinations. Pt does not appear to be responding to internal stimuli and exhibits no delusional thought. Pt's reality testing appears to be intact. He reports having no outpatient MH treatment currently. He sts his father is a source of support and he lives with his uncle.   Steven [name] is an 45 y.o. male.   Diagnosis: Bipolar I Disorder Alcohol Use Disorder, Severe Cocaine Use Disorder, Moderate  Past Medical History:  Past Medical History:  Diagnosis Date  . Asthma   . Bipolar disorder (manic depression) (HCC)   . COPD (chronic obstructive pulmonary disease) (HCC)   . Coronary artery disease   . Hx of CABG   . Hypercholesteremia   . Hypertension   . Kidney stone   . Tobacco use disorder     Past Surgical History:  Procedure Laterality Date  . ABDOMINAL ANGIOGRAM  08/15/2012   Procedure: ABDOMINAL ANGIOGRAM;  Surgeon: Corky CraftsJayadeep S Varanasi, MD;  [facility];  Service: Cardiovascular;;  .  APPENDECTOMY    . CARDIOVERSION N/A 08/28/2012   Procedure: Pseudo Compression;  Surgeon: Chuck Hinthristopher S Dickson, MD;  [facility];  Service: Cardiovascular;  Laterality: N/A;  . CORONARY ARTERY BYPASS GRAFT    . CYSTOSCOPY WITH RETROGRADE PYELOGRAM, URETEROSCOPY AND STENT PLACEMENT Right 12/31/2014   Procedure: CYSTOSCOPY  RIGHT URETEROSCOPY WITH STONE RETREIVAL RIGHT RETROGRADE PYELOGRAM;  Surgeon: Malen GauzePatrick L McKenzie, MD;  Location[facility] ORS;  Service: Urology;  Laterality: Right;  . KIDNEY STONE SURGERY    . LOWER EXTREMITY ANGIOGRAM N/A 08/15/2012   Procedure: LOWER EXTREMITY ANGIOGRAM with possible PTA /stent;  Surgeon: Corky CraftsJayadeep S Varanasi, MD;  [facility];  Service: Cardiovascular;  Laterality: N/A;    Family History:  Family History  Problem Relation Age of Onset  . Heart disease Father   . Hyperlipidemia Father   . Heart disease Maternal Grandmother     Social History:  reports that he has been smoking Cigarettes.  He has a 31.00 pack-year smoking history. He quit smokeless tobacco use about 2 years ago. He reports that he drinks alcohol. He reports that he uses drugs, including Cocaine and Marijuana.  Additional Social History:  Alcohol / Drug Use Pain Medications: pt denies abuse - see pta meds list Prescriptions: pt denies abuse - see pta meds list Over the Counter: pt denies abuse - see pta meds list History of alcohol / drug use?: Yes Longest period of sobriety (when/how long): unknown Negative Consequences of Use:  Financial, Personal relationships Substance #1 Name of Substance 1: etoh 1 - Age of First Use: teenager 1 - Amount (size/oz): one case of beer 1 - Frequency: daily 1 - Last Use / Amount: 03/18/16 - one case beer Substance #2 Name of Substance 2: crack cocaine 2 - Age of First Use: in his 6120s 2 - Amount (size/oz): varies 2 - Frequency: twice weekly 2 - Duration: months 2 - Last Use / Amount: 03/18/16 Substance #3 Name of Substance 3:  morphine - pt sts hasn't used in a long time  CIWA: CIWA-Ar BP: 151/89 Pulse Rate: 95 COWS:    PATIENT STRENGTHS: (choose at least two) Ability for insight Average or above average intelligence Capable of independent living Communication skills  Allergies: No Known Allergies  Home Medications:  (Not in a hospital admission)  OB/GYN Status:  No LMP for male patient.  General Assessment Data [facility]: [facility] TTS Assessment: In system Is this a Tele or Face-to-Face Assessment?: Face-to-Face Is this an Initial Assessment or a Re-assessment for this encounter?: Initial Assessment Marital status: Divorced [name] name: none Is patient pregnant?: No Pregnancy Status: No Living Arrangements: Other relatives (with uncle) Can pt return to current living arrangement?: Yes Admission Status: Voluntary Is patient capable of signing voluntary admission?: Yes Referral Source: Self/Family/Friend Insurance type: self pay     Crisis Care Plan Living Arrangements: Other relatives (with uncle) Name of Psychiatrist: none Name of Therapist: none  Education Status Is patient currently in school?: No Highest grade of school patient has completed: 9 Name of school: then got his GED  Risk to self with the past 6 months Suicidal Ideation: Yes-Currently Present Has patient been a risk to self within the past 6 months prior to admission? : Yes Suicidal Intent: Yes-Currently Present Has patient had any suicidal intent within the past 6 months prior to admission? : Yes Is patient at risk for suicide?: Yes Suicidal Plan?: Yes-Currently Present Has patient had any suicidal plan within the past 6 months prior to admission? : Yes Specify Current Suicidal Plan: to wreck motorcycle or something else Access to Means: Yes Specify Access to Suicidal Means: owns motorcycle What has been your use of drugs/alcohol within the last 12 months?: etoh daily, coke 2 x weekly Previous  Attempts/Gestures: Yes How many times?:  ("several") Other Self Harm Risks: none Triggers for Past Attempts: Unpredictable Intentional Self Injurious Behavior: None Family Suicide History: No Recent stressful life event(s):  (substance abuse, mental illness) Persecutory voices/beliefs?: No Depression: Yes Depression Symptoms: Loss of interest in usual pleasures, Guilt, Fatigue, Feeling angry/irritable, Feeling worthless/self pity Substance abuse history and/or treatment for substance abuse?: Yes Suicide prevention information given to non-admitted patients: Not applicable  Risk to Others within the past 6 months Homicidal Ideation: No Does patient have any lifetime risk of violence toward others beyond the six months prior to admission? : No Thoughts of Harm to Others: No Current Homicidal Intent: No Current Homicidal Plan: No Access to Homicidal Means: No Identified Victim: none History of harm to others?: No Assessment of Violence: None Noted Violent Behavior Description: pt denies hx violence Does patient have access to weapons?: No Criminal Charges Pending?: No Does patient have a court date: No Is patient on probation?: No  Psychosis Hallucinations: None noted Delusions: None noted  Mental Status Report Appearance/Hygiene: In scrubs, Disheveled Eye Contact: Fair Motor Activity: Freedom of movement Speech: Logical/coherent Level of Consciousness: Alert Mood: Depressed, Sad, Anhedonia Affect: Sad, Depressed Anxiety Level: Minimal Thought  Processes: Relevant, Coherent Judgement: Unimpaired Orientation: Person, Place, Time, Situation Obsessive Compulsive Thoughts/Behaviors: None  Cognitive Functioning Concentration: Normal Memory: Recent Impaired, Remote Impaired IQ: Average Insight: Fair Impulse Control: Poor Appetite: Fair Sleep: Decreased Total Hours of Sleep: 4 Vegetative Symptoms: None  ADLScreening [facility] Assessment Services) Patient's cognitive ability  adequate to safely complete daily activities?: Yes Patient able to express need for assistance with ADLs?: Yes Independently performs ADLs?: Yes (appropriate for developmental age)  Prior Inpatient Therapy Prior Inpatient Therapy: Yes Prior Therapy Dates: [facility], [facility] - 2016 - 2017 Prior Therapy Facilty/Provider(s): [facility] Reason for Treatment: SI, SA  Prior Outpatient Therapy Prior Outpatient Therapy: No Does patient have an ACCT team?: No Does patient have Intensive In-House Services?  : No Does patient have [facility] services? : Unknown Does patient have P4CC services?: Unknown  ADL Screening (condition at time of admission) Patient's cognitive ability adequate to safely complete daily activities?: Yes Is the patient deaf or have difficulty hearing?: No Does the patient have difficulty seeing, even when wearing glasses/contacts?: No Does the patient have difficulty concentrating, remembering, or making decisions?: Yes Patient able to express need for assistance with ADLs?: Yes Does the patient have difficulty dressing or bathing?: No Independently performs ADLs?: Yes (appropriate for developmental age) Does the patient have difficulty walking or climbing stairs?: No Weakness of Legs: None Weakness of Arms/Hands: None  Home Assistive Devices/Equipment Home Assistive Devices/Equipment: None    Abuse/Neglect Assessment (Assessment to be complete while patient is alone) Physical Abuse: Denies Verbal Abuse: Denies Sexual Abuse: Denies Exploitation of patient/patient's resources: Denies Self-Neglect: Denies     Merchant navy officer (For Healthcare) Does patient have an advance directive?: No    Additional Information 1:1 In Past 12 Months?: No CIRT Risk: No Elopement Risk: No Does patient have medical clearance?: No     Disposition:  Disposition Initial Assessment Completed for this Encounter: Yes Disposition of Patient: Other dispositions Other  disposition(s):  (dr Jannifer Franklin & Catha Nottingham lord DNP recommends OBS [facility])  Margaretmary Prisk P 03/19/2016 12:42 PM

## 2016-03-19 NOTE — [facility] Counselor (Signed)
[facility]. Pt can be transported now. Writer notified RN Verlon AuLeslie.  Evette Cristalaroline Paige Baelyn Doring, [place]CS[place] Therapeutic Triage Specialist

## 2016-03-19 NOTE — ED Notes (Signed)
[facility] Expected date:  Expected time:  Means of arrival:  Comments: Triage

## 2016-03-19 NOTE — ED Triage Notes (Signed)
Pt reports feeling stressed suicidal. No HI. Drank etoh last night, denies other recent substance use. Ambulatory to triage

## 2016-03-19 NOTE — Plan of Care (Signed)
[facility] OBSERVATION UNIT:  Family/Significant Other Suicide Prevention Education  Suicide Prevention Education:  Education Completed;,  (name of family member/significant other) has been identified by the patient as the family member/significant other with whom the patient will be residing, and identified as the person(s) who will aid the patient in the event of a mental health crisis (suicidal ideations/suicide attempt).  With written consent from the patient, the family member/significant other has been provided the following suicide prevention education, prior to the and/or following the discharge of the patient.  The suicide prevention education provided includes the following:  Suicide risk factors  Suicide prevention and interventions  National Suicide Hotline telephone number  [facility] assessment telephone number  [facility] Emergency Assistance 911  [facility] and/or Residential Mobile Crisis Unit telephone number  Request made of family/significant other to:  Remove weapons (e.g., guns, rifles, knives), all items previously/currently identified as safety concern.    Remove drugs/medications (over-the-counter, prescriptions, illicit drugs), all items previously/currently identified as a safety concern.  The family member/significant other verbalizes understanding of the suicide prevention education information provided.  The family member/significant other agrees to remove the items of safety concern listed above.  Jacquelyne BalintForrest, Bill Mcvey Shanta 03/19/2016, 4:37 PM   Jeremy Ditullio, Grafton FolkShalita Shanta, RN 03/19/16  4:36 PM

## 2016-03-19 NOTE — ED Provider Notes (Signed)
[facility]-EM[facility]ENCY DEPT Provider Note   CSN: [id number] Arrival date & time: 03/19/16  0857     History   Chief Complaint Chief Complaint  Patient presents with  . Suicidal    HPI Steven [name] is a 45 y.o. male.  HPI   Patient is a 45 year old male who presents to the ER for evaluation of suicidal thoughts, he has been feeling suicidal for months but today he states "I've had my fill of life," with passive plans to kill himself on his motorcycle.  He otherwise states his plan is to do "anyting to take me away for sure."  He has a hx of SI and suicide attempt in the past by cutting wrists and binging on cocaine hoping that doing $12,000 worth of cocaine with his PMHx of CABG, MI with d/c of all medications would kill him.  He reports severe depression and anxiety and using alcohol daily to self medicate. He states that he's been drinking a case of beer every day, and each morning if he does not drink he has severe tremors. He denies any past withdrawal or DT's.  [facility] for similar sx.  he reports being on an antidepressant that "doesn't work."  He does not recall the name of it. He states he is currently not complaining with any other home medications he is prescribed.  He denies homicidal ideations, auditory or visual hallucinations.  He is still using cocaine, last use yesterday. He denies any other illegal substance use.  Today he came to the ER because he was feeling he was going to harm himself and was either he comes to the ER or he believed he would kill himself.  He denies chest pain, shortness of breath, abdominal pain, nausea, vomiting, tremors, headache.  He reports unchanged "smoker's cough," without productive sputum, wheeze, fever, night sweats, orthopnea, PND, LE edema.  No other acute or associated sx.   Past Medical History:  Diagnosis Date  . Asthma   . Bipolar disorder (manic depression) (HCC)   . COPD (chronic obstructive pulmonary  disease) (HCC)   . Coronary artery disease   . Hx of CABG   . Hypercholesteremia   . Hypertension   . Kidney stone   . Tobacco use disorder     Patient Active Problem List   Diagnosis Date Noted  . Polysubstance abuse 02/23/2016  . Opiate dependence (HCC) 02/23/2016  . Cocaine abuse with cocaine-induced mood disorder (HCC) 02/21/2016  . MDD (major depressive disorder), recurrent severe, without psychosis (HCC) 06/18/2015  . Substance induced mood disorder (HCC) 03/08/2015  . Suicidal ideation   . COPD (chronic obstructive pulmonary disease) (HCC) 02/09/2015  . Tobacco use disorder 02/09/2015  . Stimulant use disorder (cocaine) 02/09/2015  . Alcohol use disorder, severe, in sustained remission (HCC) 02/09/2015  . Ureteral stone 12/31/2014  . Claudication of left lower extremity (HCC) 08/14/2012  . CAD s/p CABG 2012 [facility] 11/28/2011  . Hyperlipidemia LDL goal <100 03/13/2007  . Essential hypertension 03/13/2007  . GERD 03/13/2007    Past Surgical History:  Procedure Laterality Date  . ABDOMINAL ANGIOGRAM  08/15/2012   Procedure: ABDOMINAL ANGIOGRAM;  Surgeon: Corky CraftsJayadeep S Varanasi, MD;  [facility];  Service: Cardiovascular;;  . APPENDECTOMY    . CARDIOVERSION N/A 08/28/2012   Procedure: Pseudo Compression;  Surgeon: Chuck Hinthristopher S Dickson, MD;  [facility];  Service: Cardiovascular;  Laterality: N/A;  . CORONARY ARTERY BYPASS GRAFT    .  CYSTOSCOPY WITH RETROGRADE PYELOGRAM, URETEROSCOPY AND STENT PLACEMENT Right 12/31/2014   Procedure: CYSTOSCOPY  RIGHT URETEROSCOPY WITH STONE RETREIVAL RIGHT RETROGRADE PYELOGRAM;  Surgeon: Malen GauzePatrick L McKenzie, MD;  Location[facility] ORS;  Service: Urology;  Laterality: Right;  . KIDNEY STONE SURGERY    . LOWER EXTREMITY ANGIOGRAM N/A 08/15/2012   Procedure: LOWER EXTREMITY ANGIOGRAM with possible PTA /stent;  Surgeon: Corky CraftsJayadeep S Varanasi, MD;  [facility];  Service: Cardiovascular;  Laterality: N/A;        Home Medications    Prior to Admission medications   Medication Sig Start Date End Date Taking? Authorizing Provider  ARIPiprazole (ABILIFY) 5 MG tablet Take 1 tablet (5 mg total) by mouth daily. 03/02/16   Laveda AbbeLaurie Britton Parks, NP  busPIRone (BUSPAR) 5 MG tablet Take 1 tablet (5 mg total) by mouth 2 (two) times daily. 03/01/16   Laveda AbbeLaurie Britton Parks, NP  FLUoxetine (PROZAC) 20 MG capsule Take 1 capsule (20 mg total) by mouth daily. 03/02/16   Laveda AbbeLaurie Britton Parks, NP  hydrOXYzine (ATARAX/VISTARIL) 50 MG tablet Take 1 tablet (50 mg total) by mouth every 6 (six) hours as needed for anxiety. 03/01/16   Laveda AbbeLaurie Britton Parks, NP  nicotine (NICODERM CQ - DOSED IN MG/24 HOURS) 21 mg/24hr patch Place 1 patch (21 mg total) onto the skin daily. 03/02/16   Laveda AbbeLaurie Britton Parks, NP  traZODone (DESYREL) 100 MG tablet Take 1 tablet (100 mg total) by mouth at bedtime. 03/01/16   Laveda AbbeLaurie Britton Parks, NP    Family History Family History  Problem Relation Age of Onset  . Heart disease Father   . Hyperlipidemia Father   . Heart disease Maternal Grandmother     Social History Social History  Substance Use Topics  . Smoking status: Current Every Day Smoker    Packs/day: 1.00    Years: 31.00    Types: Cigarettes  . Smokeless tobacco: Former NeurosurgeonUser    Quit date: 04/27/2013  . Alcohol use Yes     Comment: last drink 03/07/2015      Allergies   Patient has no known allergies.   Review of Systems Review of Systems  All other systems reviewed and are negative.    Physical Exam Updated Vital Signs BP 154/94   Pulse 92   Temp 97.7 F (36.5 C) (Oral)   Resp 16   SpO2 95%   Physical Exam  Constitutional: He is oriented to person, place, and time. He appears well-developed and well-nourished. No distress.  HENT:  Head: Normocephalic and atraumatic.  Right Ear: External ear normal.  Left Ear: External ear normal.  Nose: Nose normal.  Mouth/Throat: Oropharynx is clear and moist.  No oropharyngeal exudate.  Multiple missing teeth  Eyes: Conjunctivae and EOM are normal. Pupils are equal, round, and reactive to light. Right eye exhibits no discharge. Left eye exhibits no discharge. No scleral icterus.  Neck: Normal range of motion. Neck supple. No JVD present. No tracheal deviation present.  Cardiovascular: Normal rate, regular rhythm, normal heart sounds and intact distal pulses.  Exam reveals no gallop and no friction rub.   No murmur heard. Regular rate and rhythm, no murmur, gallop or rub, symmetrical 2+ radial pulses and posterior tibialis pulses, no lower extremity edema No carotid bruits auscultated  Pulmonary/Chest: Effort normal and breath sounds normal. No stridor. No respiratory distress. He has no wheezes. He has no rales. He exhibits no tenderness.  Abdominal: Soft. Bowel sounds are normal. He exhibits no distension and no mass. There  is no tenderness. There is no guarding.  Obese abdomen, soft, non-distended, non-tender to palpation, normal BS x 4  Musculoskeletal: Normal range of motion. He exhibits no edema.  Lymphadenopathy:    He has no cervical adenopathy.  Neurological: He is alert and oriented to person, place, and time. He exhibits normal muscle tone. Coordination normal.  Skin: Skin is warm and dry. Capillary refill takes less than 2 seconds. No rash noted. He is not diaphoretic. No erythema. No pallor.  Psychiatric: His speech is normal. Cognition and memory are normal. He expresses suicidal ideation. He expresses no homicidal ideation. He expresses suicidal plans. He expresses no homicidal plans.  Nursing note and vitals reviewed.    ED Treatments / Results  Labs (all labs ordered are listed, but only abnormal results are displayed) Labs Reviewed  CBC  COMPREHENSIVE METABOLIC PANEL  ETHANOL  SALICYLATE LEVEL  ACETAMINOPHEN LEVEL  RAPID URINE DRUG SCREEN, HOSP PERFORMED    EKG  EKG Interpretation None       Radiology No results  found.  Procedures Procedures (including critical care time)  Medications Ordered in ED Medications  LORazepam (ATIVAN) tablet 0-4 mg (not administered)    Followed by  LORazepam (ATIVAN) tablet 0-4 mg (not administered)  thiamine (VITAMIN B-1) tablet 100 mg (not administered)    Or  thiamine (B-1) injection 100 mg (not administered)  alum & mag hydroxide-simeth (MAALOX/MYLANTA) 200-200-20 MG/5ML suspension 30 mL (not administered)  ondansetron (ZOFRAN) tablet 4 mg (not administered)  nicotine (NICODERM CQ - dosed in mg/24 hours) patch 21 mg (not administered)  zolpidem (AMBIEN) tablet 5 mg (not administered)  ibuprofen (ADVIL,MOTRIN) tablet 600 mg (not administered)  acetaminophen (TYLENOL) tablet 650 mg (not administered)     Initial Impression / Assessment and Plan / ED Course  I have reviewed the triage vital signs and the nursing notes.  Pertinent labs & imaging results that were available during my care of the patient were reviewed by me and considered in my medical decision making (see chart for details).  Clinical Course   45 year old male with suicidal ideations, history of suicide attempt, reports passive suicidal plans to crash motorcycle, endorses ETOH abuse, cocaine use, depression, anxiety. He feels he may harm himself if not in the ER, he denies HI and AVH  He is a current smoker, requests nicotine patch.  Hx of CAD, MI, CABG, no CP, SOB.  Normal cardiovascular and pulmonary physical exam.  Med clearance labs obtained, sitter ordered, feel he is a risk to himself.  CIWA ordered, last drink was at 2 am today, he is at risk for withdrawal, no sx of it currently.  He requests medication for help with his agitation.  Explained I would like to wait for TTS eval first.  Pt medically cleared and dispo according to [facility].  Final Clinical Impressions(s) / ED Diagnoses   Final diagnoses:  Cocaine abuse with cocaine-induced mood disorder [facility])    New Prescriptions New  Prescriptions   No medications on file     Danelle Berry, PA-C 04/04/16 1921    Courteney Randall An, MD 04/05/16 1543

## 2016-03-19 NOTE — ED Notes (Signed)
Report given to Crystal BayShalita, [facility]raiserN [facility].  Patient going to Observation bed 2

## 2016-03-19 NOTE — ED Notes (Signed)
Social worker at bedside.

## 2016-03-20 DIAGNOSIS — Z8489 Family history of other specified conditions: Secondary | ICD-10-CM | POA: Diagnosis not present

## 2016-03-20 DIAGNOSIS — F1994 Other psychoactive substance use, unspecified with psychoactive substance-induced mood disorder: Secondary | ICD-10-CM

## 2016-03-20 DIAGNOSIS — Z8249 Family history of ischemic heart disease and other diseases of the circulatory system: Secondary | ICD-10-CM | POA: Diagnosis not present

## 2016-03-20 DIAGNOSIS — Z9889 Other specified postprocedural states: Secondary | ICD-10-CM | POA: Diagnosis not present

## 2016-03-20 DIAGNOSIS — F17210 Nicotine dependence, cigarettes, uncomplicated: Secondary | ICD-10-CM

## 2016-03-20 MED ORDER — BUSPIRONE HCL 5 MG PO TABS: 5.0000 mg | ORAL_TABLET | Freq: Two times a day (BID) | ORAL | 0 refills | Status: DC

## 2016-03-20 MED ORDER — FLUOXETINE HCL 20 MG PO CAPS: 20.0000 mg | ORAL_CAPSULE | Freq: Every day | ORAL | 0 refills | Status: DC

## 2016-03-20 MED ORDER — NICOTINE 21 MG/24HR TD PT24: 21.0000 mg | MEDICATED_PATCH | Freq: Every day | TRANSDERMAL | 0 refills | Status: DC

## 2016-03-20 MED ORDER — TRAZODONE HCL 100 MG PO TABS: 100.0000 mg | ORAL_TABLET | Freq: Every day | ORAL | 0 refills | Status: DC

## 2016-03-20 MED ORDER — ARIPIPRAZOLE 5 MG PO TABS: 5.0000 mg | ORAL_TABLET | Freq: Every day | ORAL | Status: DC

## 2016-03-20 NOTE — Progress Notes (Signed)
This Clinical research associatewriter spoke to the patient about his plans for after discharge this afternoon. The patient stated he wants assistance with his substance abuse issues. [facility] This Clinical research associatewriter has provided the patient with information about Alcohol treatment centers including [facility]'s address and number. The patient was receptive to the information. 11:18AM 03/20/2016 Carmell AustriaAisha Nixon Sparr

## 2016-03-20 NOTE — Progress Notes (Signed)
D: Pt A & O X4. Denies SI, HI, AVH and pain at this time. Presents animated and in bright spirits at time  A: Scheduled and PRN medications administered as ordered with verbal education. Support and availability provided to pt. D/C instructions reviewed with pt. All belongings in locker 49 & 55 returned to pt prior to departure from facility. Safety maintained on and off unit till time of d/c without gestures of self harm behavior.  R: Pt receptive to care.Compliant with medications as ordered. Denies adverse drug reactions. Verbalized understanding related to d/c instructions. Signed belonging sheet in agreement with items received. Ambulatory with a steady gait. Appears to be in no physical distress at time of d/c.

## 2016-03-20 NOTE — Discharge Summary (Signed)
[facility] Observation Unit Discharge Summary Note  Patient:  Steven [name] is an 45 y.o., male MRN:  [id number] DOB:  04-Aug-1970 Patient phone:  [phone] (home)  Patient address:   [address] [place] [address],  Total Time spent with patient: 30 minutes  Date of Admission:  03/19/2016 Date of Discharge: 03/20/2016  Reason for Admission: Cocaine abuse with cocaine induced mood disorder  Principal Problem: Substance induced mood disorder [facility]) Discharge Diagnoses: Polysubstance abuse  Patient Active Problem List   Diagnosis Date Noted  . Polysubstance abuse [F19.10] 02/23/2016  . Opiate dependence (HCC) [F11.20] 02/23/2016  . 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 MDD (major depressive disorder), recurrent severe, without psychosis (HCC) [F33.2] 06/18/2015  . Substance induced mood disorder (HCC) [F19.94] 03/08/2015  . Suicidal ideation [R45.851]   . COPD (chronic obstructive pulmonary disease) (HCC) [J44.9] 02/09/2015  . Tobacco use disorder [F17.200] 02/09/2015  . Stimulant use disorder (cocaine) [F15.90] 02/09/2015  . Alcohol use disorder, severe, in sustained remission (HCC) [F10.21] 02/09/2015  . Ureteral stone [N20.1] 12/31/2014  . 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 CAD s/p CABG 2012 [facility] [I25.810] 11/28/2011  . Hyperlipidemia LDL goal <100 [E78.5] 03/13/2007  . Essential hypertension [I10] 03/13/2007  . GERD [K21.9] 03/13/2007    Past Psychiatric History: Bi Polar Depression, substance abuse, suicidal ideations  Past Medical History:  Past Medical History:  Diagnosis Date  . Asthma   . Bipolar disorder (manic depression) (HCC)   . COPD (chronic obstructive pulmonary disease) (HCC)   . Coronary artery disease   . Hx of CABG   . Hypercholesteremia   . Hypertension   . Kidney stone   . Tobacco use disorder     Past Surgical History:  Procedure Laterality Date  .  ABDOMINAL ANGIOGRAM  08/15/2012   Procedure: ABDOMINAL ANGIOGRAM;  Surgeon: Corky Crafts, MD;  Location: [facility] CATH LAB;  Service: Cardiovascular;;  . APPENDECTOMY    . CARDIOVERSION N/A 08/28/2012   Procedure: Pseudo Compression;  Surgeon: Chuck Hint, MD;  Location: [facility] CATH LAB;  Service: Cardiovascular;  Laterality: N/A;  . CORONARY ARTERY BYPASS GRAFT    . CYSTOSCOPY WITH RETROGRADE PYELOGRAM, URETEROSCOPY AND STENT PLACEMENT Right 12/31/2014   Procedure: CYSTOSCOPY  RIGHT URETEROSCOPY WITH STONE RETREIVAL RIGHT RETROGRADE PYELOGRAM;  Surgeon: Malen Gauze, MD;  Location: [facility] ORS;  Service: Urology;  Laterality: Right;  . KIDNEY STONE SURGERY    . LOWER EXTREMITY ANGIOGRAM N/A 08/15/2012   Procedure: LOWER EXTREMITY ANGIOGRAM with possible PTA /stent;  Surgeon: Corky Crafts, MD;  Location: [facility] CATH LAB;  Service: Cardiovascular;  Laterality: N/A;   Family History:  Family History  Problem Relation Age of Onset  . Heart disease Father   . Hyperlipidemia Father   . Heart disease Maternal Grandmother    Family Psychiatric  History: MDD Social History:  History  Alcohol Use  . Yes    Comment: last drink 03/07/2015      History  Drug Use  . Types: Cocaine, Marijuana    Comment: 10/23//2016    Social History   Social History  . Marital status: Divorced    Spouse name: N/A  . Number of children: N/A  . Years of education: N/A   Social History Main Topics  . Smoking status: Current Every Day Smoker    Packs/day: 1.00    Years: 31.00    Types: Cigarettes  . Smokeless tobacco:  Former NeurosurgeonUser    Quit date: 04/27/2013  . Alcohol use Yes     Comment: last drink 03/07/2015   . Drug use:     Types: Cocaine, Marijuana     Comment: 10/23//2016  . Sexual activity: Yes    Birth control/ protection: Condom   Other Topics Concern  . None   Social History Narrative  . None    Hospital Course:    Steven [name] is a 45 year old male who presented to [facility] with suicidal ideations with a plan to wreck his motorcycle. On exam patient reports suicidal ideations with a plan, no specific plan on how or where he would wreck his motorcycle. States "I am just tired of living. Things just never get better." Reports that he has continued to use alcohol and cocaine [facility]. Did not follow up with [facility]. Reports that he has been living with his Uncle off and on and will be able to return there after discharge, but feels that it is not a healthy situation. Reports that his Kateri McUncle is disabled due to a motorcycle accident and is very irritable. Denies homicidal ideations. Denies audiovisual hallucinations, Does not appear to be responding to internal stimuli.When discussing plans patient states, "I don't care what happens, I just really don't know what I want to do."  Patient was monitored overnight in the [facility]-Observation Unit. He reported going to [facility] for substance abuse treatment but states "I had to leave after a week because my Uncle had a medical problem. Then I started drinking again like a case of beer per day." [name] with [facility]. Patient's report of withdrawal symptoms was not consistent with [facility] and [facility]. His vitals were noted to be stable and his CIWA score was a four. The [facility]-Observation Counselor assisted patient with re-establishing services at [facility]. Steven [name] was documented to have a bright affect this morning and denied any suicidal ideation prior to his discharge. Steven [name] appeared to be future oriented and motivated to continue to seek help for his substance abuse problems.   Physical Findings: AIMS:  , ,  ,  ,    CIWA:  CIWA-Ar Total: 4 COWS:     Musculoskeletal: Strength & Muscle Tone: within normal limits Gait & Station: normal Patient leans: N/A  Psychiatric Specialty  Exam: Physical Exam  ROS  Blood pressure 136/84, pulse 100, temperature 98.4 F (36.9 C), temperature source Oral, resp. rate 20, height 5\' 10"  (1.778 m), weight 97.5 kg (215 lb), SpO2 100 %.Body mass index is 30.85 kg/m.  General Appearance: Casual and Fairly Groomed  Eye Contact:  Good  Speech:  Clear and Coherent and Normal Rate  Volume:  Normal  Mood:  Anxious and Depressed  Affect:  Appropriate, Congruent and Flat  Thought Process:  Coherent, Goal Directed and Linear  Orientation:  Full (Time, Place, and Person)  Thought Content:  Logical  Suicidal Thoughts:  No  Homicidal Thoughts:  No  Memory:  Immediate;   Good Recent;   Good Remote;   Fair  Judgement:  Fair  Insight:  Fair  Psychomotor Activity:  Normal  Concentration:  Concentration: Good and Attention Span: Good  Recall:  FiservFair  Fund of Knowledge:  Fair  Language:  Good  Akathisia:  No  Handed:  Right  AIMS (if indicated):     Assets:  Communication Skills  Desire for Improvement Resilience Transportation  ADL's:  Intact  Cognition:  WNL  Sleep:   fair    Discharge destination:  Home, with follow up at [facility] for clinical intake      Medication List    TAKE these medications     Indication  ARIPiprazole 5 MG tablet Commonly known as:  ABILIFY Take 1 tablet (5 mg total) by mouth daily.  Indication:  Major Depressive Disorder   busPIRone 5 MG tablet Commonly known as:  BUSPAR Take 1 tablet (5 mg total) by mouth 2 (two) times daily.  Indication:  Symptoms of Feeling Anxious   FLUoxetine 20 MG capsule Commonly known as:  PROZAC Take 1 capsule (20 mg total) by mouth daily.  Indication:  Depression   hydrOXYzine 50 MG tablet Commonly known as:  ATARAX/VISTARIL Take 1 tablet (50 mg total) by mouth every 6 (six) hours as needed for anxiety.  Indication:  Anxiety Neurosis   nicotine 21 mg/24hr patch Commonly known as:  NICODERM CQ - dosed in mg/24 hours Place 1 patch (21 mg total) onto the skin  daily.  Indication:  Nicotine Addiction   traZODone 100 MG tablet Commonly known as:  DESYREL Take 1 tablet (100 mg total) by mouth at bedtime.  Indication:  Trouble Sleeping       Treatment Plan:    Discharge home today with follow-[facility] [place] for clinical intake and possible residential placement for assistance with substance abuse.    Patient reported having prescriptions due to his recent admission to the [facility]-Observation Unit.  Medications:  Buspar 5 mg BID Trazadone 50 mg QHS Vistaril 25 mg q6hrs PRN Abilify 5 mg QD Prozac 20 mg QD  Disposition: Discharge home with follow up services in place at [facility] on 03/02/2016 for clinical intake  Signed: Fransisca KaufmannAVIS, Iliza Blankenbeckler, NP 03/20/2016, 4:04 PM

## 2016-03-23 ENCOUNTER — Inpatient Hospital Stay (HOSPITAL_COMMUNITY)
Admission: AD | Admit: 2016-03-23 | Discharge: 2016-03-27 | DRG: 885 | Disposition: A | Discharge status: Home / Self Care | Payer: Federal, State, Local not specified - Other | Source: Intra-hospital | Attending: Psychiatry | Admitting: Psychiatry

## 2016-03-23 ENCOUNTER — Emergency Department (HOSPITAL_COMMUNITY)
Admission: EM | Admit: 2016-03-23 | Discharge: 2016-03-23 | Disposition: A | Discharge status: Home / Self Care | Payer: Self-pay | Attending: Physician Assistant | Admitting: Physician Assistant

## 2016-03-23 ENCOUNTER — Encounter (HOSPITAL_COMMUNITY): Payer: Self-pay | Admitting: Emergency Medicine

## 2016-03-23 ENCOUNTER — Encounter (HOSPITAL_COMMUNITY): Payer: Self-pay

## 2016-03-23 DIAGNOSIS — F1021 Alcohol dependence, in remission: Secondary | ICD-10-CM | POA: Diagnosis present

## 2016-03-23 DIAGNOSIS — F1120 Opioid dependence, uncomplicated: Secondary | ICD-10-CM | POA: Diagnosis present

## 2016-03-23 DIAGNOSIS — F1414 Cocaine abuse with cocaine-induced mood disorder: Secondary | ICD-10-CM | POA: Diagnosis present

## 2016-03-23 DIAGNOSIS — F332 Major depressive disorder, recurrent severe without psychotic features: Secondary | ICD-10-CM | POA: Diagnosis present

## 2016-03-23 DIAGNOSIS — G4700 Insomnia, unspecified: Secondary | ICD-10-CM | POA: Diagnosis present

## 2016-03-23 DIAGNOSIS — I2510 Atherosclerotic heart disease of native coronary artery without angina pectoris: Secondary | ICD-10-CM | POA: Diagnosis present

## 2016-03-23 DIAGNOSIS — Z9889 Other specified postprocedural states: Secondary | ICD-10-CM | POA: Diagnosis not present

## 2016-03-23 DIAGNOSIS — Z91128 Patient's intentional underdosing of medication regimen for other reason: Secondary | ICD-10-CM

## 2016-03-23 DIAGNOSIS — I10 Essential (primary) hypertension: Secondary | ICD-10-CM | POA: Diagnosis present

## 2016-03-23 DIAGNOSIS — R45851 Suicidal ideations: Secondary | ICD-10-CM

## 2016-03-23 DIAGNOSIS — J449 Chronic obstructive pulmonary disease, unspecified: Secondary | ICD-10-CM | POA: Diagnosis present

## 2016-03-23 DIAGNOSIS — E7800 Pure hypercholesterolemia, unspecified: Secondary | ICD-10-CM | POA: Diagnosis present

## 2016-03-23 DIAGNOSIS — Z951 Presence of aortocoronary bypass graft: Secondary | ICD-10-CM | POA: Diagnosis not present

## 2016-03-23 DIAGNOSIS — K219 Gastro-esophageal reflux disease without esophagitis: Secondary | ICD-10-CM | POA: Diagnosis present

## 2016-03-23 DIAGNOSIS — F419 Anxiety disorder, unspecified: Secondary | ICD-10-CM | POA: Diagnosis present

## 2016-03-23 DIAGNOSIS — Z8249 Family history of ischemic heart disease and other diseases of the circulatory system: Secondary | ICD-10-CM | POA: Diagnosis not present

## 2016-03-23 DIAGNOSIS — F17210 Nicotine dependence, cigarettes, uncomplicated: Secondary | ICD-10-CM | POA: Insufficient documentation

## 2016-03-23 DIAGNOSIS — E118 Type 2 diabetes mellitus with unspecified complications: Secondary | ICD-10-CM | POA: Diagnosis present

## 2016-03-23 DIAGNOSIS — F1910 Other psychoactive substance abuse, uncomplicated: Secondary | ICD-10-CM | POA: Diagnosis present

## 2016-03-23 DIAGNOSIS — Z87442 Personal history of urinary calculi: Secondary | ICD-10-CM

## 2016-03-23 DIAGNOSIS — Z8349 Family history of other endocrine, nutritional and metabolic diseases: Secondary | ICD-10-CM | POA: Diagnosis not present

## 2016-03-23 DIAGNOSIS — Z79899 Other long term (current) drug therapy: Secondary | ICD-10-CM

## 2016-03-23 DIAGNOSIS — Z87820 Personal history of traumatic brain injury: Secondary | ICD-10-CM | POA: Diagnosis not present

## 2016-03-23 LAB — RAPID URINE DRUG SCREEN, HOSP PERFORMED
AMPHETAMINES: NOT DETECTED
Barbiturates: NOT DETECTED
Benzodiazepines: NOT DETECTED
COCAINE: POSITIVE — AB
OPIATES: NOT DETECTED
Tetrahydrocannabinol: POSITIVE — AB

## 2016-03-23 LAB — CBC
HCT: 46.8 % (ref 39.0–52.0)
Hemoglobin: 16 g/dL (ref 13.0–17.0)
MCH: 32.6 pg (ref 26.0–34.0)
MCHC: 34.2 g/dL (ref 30.0–36.0)
MCV: 95.3 fL (ref 78.0–100.0)
PLATELETS: 252 10*3/uL (ref 150–400)
RBC: 4.91 MIL/uL (ref 4.22–5.81)
RDW: 15.1 % (ref 11.5–15.5)
WBC: 7.6 10*3/uL (ref 4.0–10.5)

## 2016-03-23 LAB — COMPREHENSIVE METABOLIC PANEL
ALT: 39 U/L (ref 17–63)
ANION GAP: 9 (ref 5–15)
AST: 36 U/L (ref 15–41)
Albumin: 4.3 g/dL (ref 3.5–5.0)
Alkaline Phosphatase: 78 U/L (ref 38–126)
BUN: 24 mg/dL — ABNORMAL HIGH (ref 6–20)
CHLORIDE: 102 mmol/L (ref 101–111)
CO2: 25 mmol/L (ref 22–32)
CREATININE: 1.1 mg/dL (ref 0.61–1.24)
Calcium: 9.3 mg/dL (ref 8.9–10.3)
Glucose, Bld: 144 mg/dL — ABNORMAL HIGH (ref 65–99)
Potassium: 3.9 mmol/L (ref 3.5–5.1)
SODIUM: 136 mmol/L (ref 135–145)
Total Bilirubin: 0.8 mg/dL (ref 0.3–1.2)
Total Protein: 7.6 g/dL (ref 6.5–8.1)

## 2016-03-23 LAB — ETHANOL

## 2016-03-23 LAB — SALICYLATE LEVEL

## 2016-03-23 LAB — ACETAMINOPHEN LEVEL

## 2016-03-23 MED ORDER — ZOLPIDEM TARTRATE 5 MG PO TABS: 5.0000 mg | ORAL_TABLET | Freq: Every evening | ORAL | Status: DC | PRN

## 2016-03-23 MED ORDER — ACETAMINOPHEN 325 MG PO TABS
650.0000 mg | ORAL_TABLET | Freq: Four times a day (QID) | ORAL | Status: DC | PRN
  Administered 2016-03-24 – 2016-03-26 (×5): 650 mg via ORAL
  Filled 2016-03-23 (×5): qty 2

## 2016-03-23 MED ORDER — LORAZEPAM 1 MG PO TABS
0.0000 mg | ORAL_TABLET | Freq: Four times a day (QID) | ORAL | Status: DC
  Administered 2016-03-23: 2 mg via ORAL
  Filled 2016-03-23: qty 2

## 2016-03-23 MED ORDER — INFLUENZA VAC SPLIT QUAD 0.5 ML IM SUSY
0.5000 mL | PREFILLED_SYRINGE | INTRAMUSCULAR | Status: DC
  Filled 2016-03-23: qty 0.5

## 2016-03-23 MED ORDER — NICOTINE 21 MG/24HR TD PT24
21.0000 mg | MEDICATED_PATCH | Freq: Every day | TRANSDERMAL | Status: DC
  Administered 2016-03-24 – 2016-03-27 (×4): 21 mg via TRANSDERMAL
  Filled 2016-03-23 (×6): qty 1

## 2016-03-23 MED ORDER — FLUOXETINE HCL 20 MG PO CAPS
20.0000 mg | ORAL_CAPSULE | Freq: Every day | ORAL | Status: DC
  Administered 2016-03-24 – 2016-03-25 (×2): 20 mg via ORAL
  Filled 2016-03-23 (×4): qty 1

## 2016-03-23 MED ORDER — TRAZODONE HCL 50 MG PO TABS
50.0000 mg | ORAL_TABLET | Freq: Every evening | ORAL | Status: DC | PRN
  Administered 2016-03-23 – 2016-03-26 (×4): 50 mg via ORAL
  Filled 2016-03-23 (×4): qty 1

## 2016-03-23 MED ORDER — HYDROXYZINE HCL 25 MG PO TABS
25.0000 mg | ORAL_TABLET | Freq: Three times a day (TID) | ORAL | Status: DC | PRN
  Administered 2016-03-23 – 2016-03-24 (×2): 25 mg via ORAL
  Filled 2016-03-23 (×2): qty 1

## 2016-03-23 MED ORDER — METOPROLOL TARTRATE 25 MG PO TABS
50.0000 mg | ORAL_TABLET | Freq: Once | ORAL | Status: AC
  Administered 2016-03-23: 50 mg via ORAL
  Filled 2016-03-23: qty 2

## 2016-03-23 MED ORDER — ALBUTEROL SULFATE HFA 108 (90 BASE) MCG/ACT IN AERS
2.0000 | INHALATION_SPRAY | Freq: Four times a day (QID) | RESPIRATORY_TRACT | Status: DC | PRN
  Administered 2016-03-24 – 2016-03-25 (×2): 2 via RESPIRATORY_TRACT
  Filled 2016-03-23: qty 6.7

## 2016-03-23 MED ORDER — HYDRALAZINE HCL 20 MG/ML IJ SOLN: 5.0000 mg | Freq: Once | INTRAMUSCULAR | Status: DC

## 2016-03-23 MED ORDER — ONDANSETRON HCL 4 MG PO TABS: 4.0000 mg | ORAL_TABLET | Freq: Three times a day (TID) | ORAL | Status: DC | PRN

## 2016-03-23 MED ORDER — ACETAMINOPHEN 325 MG PO TABS: 650.0000 mg | ORAL_TABLET | ORAL | Status: DC | PRN

## 2016-03-23 MED ORDER — ALUM & MAG HYDROXIDE-SIMETH 200-200-20 MG/5ML PO SUSP: 30.0000 mL | ORAL | Status: DC | PRN

## 2016-03-23 MED ORDER — ARIPIPRAZOLE 5 MG PO TABS
5.0000 mg | ORAL_TABLET | Freq: Every day | ORAL | Status: DC
  Administered 2016-03-24 – 2016-03-27 (×4): 5 mg via ORAL
  Filled 2016-03-23 (×6): qty 1

## 2016-03-23 MED ORDER — THIAMINE HCL 100 MG/ML IJ SOLN: 100.0000 mg | Freq: Every day | INTRAMUSCULAR | Status: DC

## 2016-03-23 MED ORDER — MAGNESIUM HYDROXIDE 400 MG/5ML PO SUSP: 30.0000 mL | Freq: Every day | ORAL | Status: DC | PRN

## 2016-03-23 MED ORDER — LORAZEPAM 1 MG PO TABS: 0.0000 mg | ORAL_TABLET | Freq: Two times a day (BID) | ORAL | Status: DC

## 2016-03-23 MED ORDER — ALUM & MAG HYDROXIDE-SIMETH 200-200-20 MG/5ML PO SUSP
30.0000 mL | ORAL | Status: DC | PRN
  Administered 2016-03-23 – 2016-03-25 (×3): 30 mL via ORAL
  Filled 2016-03-23 (×3): qty 30

## 2016-03-23 MED ORDER — VITAMIN B-1 100 MG PO TABS
100.0000 mg | ORAL_TABLET | Freq: Every day | ORAL | Status: DC
  Administered 2016-03-23: 100 mg via ORAL
  Filled 2016-03-23: qty 1

## 2016-03-23 MED ORDER — NICOTINE 21 MG/24HR TD PT24
21.0000 mg | MEDICATED_PATCH | Freq: Every day | TRANSDERMAL | Status: DC
  Administered 2016-03-23: 21 mg via TRANSDERMAL
  Filled 2016-03-23: qty 1

## 2016-03-23 MED ORDER — LISINOPRIL 20 MG PO TABS
20.0000 mg | ORAL_TABLET | Freq: Every day | ORAL | Status: DC
  Administered 2016-03-24 – 2016-03-27 (×4): 20 mg via ORAL
  Filled 2016-03-23 (×6): qty 1

## 2016-03-23 MED ORDER — IBUPROFEN 200 MG PO TABS: 600.0000 mg | ORAL_TABLET | Freq: Three times a day (TID) | ORAL | Status: DC | PRN

## 2016-03-23 MED ORDER — METFORMIN HCL 500 MG PO TABS
500.0000 mg | ORAL_TABLET | Freq: Every day | ORAL | Status: DC
Start: 2016-03-24 — End: 2016-03-27
  Administered 2016-03-25 – 2016-03-27 (×3): 500 mg via ORAL
  Filled 2016-03-23 (×6): qty 1

## 2016-03-23 NOTE — BH Assessment (Signed)
[facility] Assessment Progress Note  Per Thedore MinsMojeed Akintayo, MD, this pt requires psychiatric hospitalization at this time.  Lillia AbedLindsay, RN, [id number]; they will be ready to receive pt once pt is medically cleared.  Pt has signed Voluntary Admission and Consent for Treatment, as well as Consent to Release Information to no one, and signed forms have been faxed to [facility].  Pt's nurse has been notified, and agrees to send original paperwork along with pt via [place], and to call report to [phone].  Doylene Canninghomas Willard Farquharson, MA Triage Specialist [phone]

## 2016-03-23 NOTE — Progress Notes (Signed)
Pt did not attend karaoke group this evening.  

## 2016-03-23 NOTE — BH Assessment (Addendum)
Assessment Note  Steven [name] is a 45 y.o. male [facility]. Pt states that he was on his bike today and was fighting the urge to "slam my bike into something". Pt stated that he became worried that he would not be able to actually die by doing this so he decided to come to ED for help. Pt identified the trigger to his feelings of suicide (and more specifically, hopelessness, as he stated several times) as "I've had my fill. I've had enough. I don't want to deal anymore". Upon inquiry about what he didn't want to deal with anymore, pt replied, "everything". Upon further inquiry, pt replied, "life in general". Pt is well known to [facility]-last being d/c from the Observation Unit 3 days ago. Pt regularly uses alcohol and cocaine to "self-medicate". Clinician asked pt if he adhered to the recommendations for f/u for OP treatment after [facility].  [facility] indicates [facility], but was told he couldn't come back for 90 days. Pt denied f/u with any OP psychiatry or therapy. Clinician processed with pt concerning his ultimate desired result. Pt discussed ambivalence in wanting to stop using alcohol/drugs, citing, "my life is not really better either way". Pt shared that he has applied for disability and feels that, if awarded this, his life would be better. Pt currently does not feel safe to be d/c, feeling that he will act on suicidal thoughts. Pt states, "if I could get a few days behind me without drinking, I think I would be better".   Diagnosis: MDD, recurrent episode, severe; Alcohol use disorder; Cocaine use disorder  Past Medical History:  Past Medical History:  Diagnosis Date  . Asthma   . Bipolar disorder (manic depression) (HCC)   . COPD (chronic obstructive pulmonary disease) (HCC)   . Coronary artery disease   . Hx of CABG   . Hypercholesteremia   . Hypertension   . Kidney stone   . Tobacco use disorder     Past Surgical  History:  Procedure Laterality Date  . ABDOMINAL ANGIOGRAM  08/15/2012   Procedure: ABDOMINAL ANGIOGRAM;  Surgeon: Corky CraftsJayadeep S Varanasi, MD;  [facility];  Service: Cardiovascular;;  . APPENDECTOMY    . CARDIOVERSION N/A 08/28/2012   Procedure: Pseudo Compression;  Surgeon: Chuck Hinthristopher S Dickson, MD;  [facility];  Service: Cardiovascular;  Laterality: N/A;  . CORONARY ARTERY BYPASS GRAFT    . CYSTOSCOPY WITH RETROGRADE PYELOGRAM, URETEROSCOPY AND STENT PLACEMENT Right 12/31/2014   Procedure: CYSTOSCOPY  RIGHT URETEROSCOPY WITH STONE RETREIVAL RIGHT RETROGRADE PYELOGRAM;  Surgeon: Malen GauzePatrick L McKenzie, MD;  Location: [facility] ORS;  Service: Urology;  Laterality: Right;  . KIDNEY STONE SURGERY    . LOWER EXTREMITY ANGIOGRAM N/A 08/15/2012   Procedure: LOWER EXTREMITY ANGIOGRAM with possible PTA /stent;  Surgeon: Corky CraftsJayadeep S Varanasi, MD;  [facility];  Service: Cardiovascular;  Laterality: N/A;    Family History:  Family History  Problem Relation Age of Onset  . Heart disease Father   . Hyperlipidemia Father   . Heart disease Maternal Grandmother     Social History:  reports that he has been smoking Cigarettes.  He has a 31.00 pack-year smoking history. He quit smokeless tobacco use about 2 years ago. He reports that he drinks alcohol. He reports that he uses drugs, including Cocaine and Marijuana.  Additional Social History:  Alcohol / Drug Use Pain Medications: denies Prescriptions: denies Over  the Counter: denies History of alcohol / drug use?: Yes Longest period of sobriety (when/how long): 18 months from 2015-2016 Substance #1 Name of Substance 1: etoh 1 - Age of First Use: teenager 1 - Amount (size/oz): one case of beer 1 - Frequency: daily 1 - Duration: on-going 1 - Last Use / Amount: yesterday/12 pack Substance #2 Name of Substance 2: crack cocaine 2 - Age of First Use: in his 85s 2 - Amount (size/oz): varies 2 - Frequency: twice weekly 2 - Duration:  ongoing 2 - Last Use / Amount: day before yesterday  CIWA: CIWA-Ar BP: 179/91 Pulse Rate: 92 COWS:    Allergies: No Known Allergies  Home Medications:  (Not in a hospital admission)  OB/GYN Status:  No LMP for male patient.  General Assessment Data [facility] of [facility]: [facility] TTS Assessment: In system Is this a Tele or Face-to-Face Assessment?: Face-to-Face Is this an Initial Assessment or a Re-assessment for this encounter?: Initial Assessment Marital status: Divorced Living Arrangements: Other relatives Can pt return to current living arrangement?: Yes Admission Status: Voluntary Is patient capable of signing voluntary admission?: Yes Referral Source: Self/Family/Friend Insurance type: none     Crisis Care Plan Living Arrangements: Other relatives Name of Psychiatrist: none Name of Therapist: none  Education Status Is patient currently in school?: No  Risk to self with the past 6 months Suicidal Ideation: Yes-Currently Present Has patient been a risk to self within the past 6 months prior to admission? : Yes Suicidal Intent: Yes-Currently Present Has patient had any suicidal intent within the past 6 months prior to admission? : Yes Is patient at risk for suicide?: Yes Suicidal Plan?: Yes-Currently Present Has patient had any suicidal plan within the past 6 months prior to admission? : Yes Specify Current Suicidal Plan: pt planned to slam his bike into something Access to Means: Yes Specify Access to Suicidal Means: pt has a bike What has been your use of drugs/alcohol within the last 12 months?: see above Previous Attempts/Gestures: Yes How many times?:  (several) Triggers for Past Attempts: Other (Comment) (discontent with life situations) Intentional Self Injurious Behavior: None Family Suicide History: No Recent stressful life event(s): Other (Comment) (see narrative) Persecutory voices/beliefs?: No Depression: Yes Depression Symptoms: Feeling  worthless/self pity Substance abuse history and/or treatment for substance abuse?: Yes Suicide prevention information given to non-admitted patients: Not applicable  Risk to Others within the past 6 months Homicidal Ideation: No Does patient have any lifetime risk of violence toward others beyond the six months prior to admission? : No Thoughts of Harm to Others: No Current Homicidal Intent: No Current Homicidal Plan: No Access to Homicidal Means: No History of harm to others?: No Assessment of Violence: None Noted Does patient have access to weapons?: No Criminal Charges Pending?: No Does patient have a court date: No Is patient on probation?: No  Psychosis Hallucinations: None noted Delusions: None noted  Mental Status Report Appearance/Hygiene: Unremarkable Eye Contact: Fair Motor Activity: Unremarkable Speech: Logical/coherent Level of Consciousness: Alert Mood: Pleasant Affect: Appropriate to circumstance Anxiety Level: Minimal Thought Processes: Coherent, Relevant Judgement: Partial Orientation: Person, Place, Time, Situation Obsessive Compulsive Thoughts/Behaviors: None  Cognitive Functioning Concentration: Normal Memory: Recent Intact, Remote Intact IQ: Average Insight: Good Impulse Control: Poor Appetite: Fair Sleep: No Change Vegetative Symptoms: None  ADLScreening [facility] Assessment Services) Patient's cognitive ability adequate to safely complete daily activities?: Yes Patient able to express need for assistance with ADLs?: Yes Independently performs ADLs?: Yes (appropriate for developmental age)  Prior Inpatient Therapy Prior Inpatient Therapy: Yes Prior Therapy Dates: multiple times [facility] Reason for Treatment: SI, SA  Prior Outpatient Therapy Prior Outpatient Therapy: Yes Prior Therapy Dates: 2014 Prior Therapy Facilty/Provider(s): [facility] Reason for Treatment: bipolar; substance abuse Does patient have  an ACCT team?: No Does patient have Intensive In-House Services?  : No Does patient have [facility] services? : No Does patient have P4CC services?: No  ADL Screening (condition at time of admission) Patient's cognitive ability adequate to safely complete daily activities?: Yes Is the patient deaf or have difficulty hearing?: No Does the patient have difficulty seeing, even when wearing glasses/contacts?: No Does the patient have difficulty concentrating, remembering, or making decisions?: No Patient able to express need for assistance with ADLs?: Yes Does the patient have difficulty dressing or bathing?: No Independently performs ADLs?: Yes (appropriate for developmental age) Does the patient have difficulty walking or climbing stairs?: No Weakness of Legs: None Weakness of Arms/Hands: None  Home Assistive Devices/Equipment Home Assistive Devices/Equipment: None    Abuse/Neglect Assessment (Assessment to be complete while patient is alone) Physical Abuse: Denies Verbal Abuse: Denies Sexual Abuse: Denies Exploitation of patient/patient's resources: Denies Self-Neglect: Denies Values / Beliefs Cultural Requests During Hospitalization: None Spiritual Requests During Hospitalization: None   Advance Directives (For Healthcare) Does patient have an advance directive?: No Would patient like information on creating an advanced directive?: No - patient declined information    Additional Information 1:1 In Past 12 Months?: No CIRT Risk: No Elopement Risk: No Does patient have medical clearance?: Yes     Disposition:  Disposition Initial Assessment Completed for this Encounter: Yes  On Site Evaluation by:   Reviewed with Physician:    Laddie AquasSamantha M Kabrina Christiano 03/23/2016 10:24 AM

## 2016-03-23 NOTE — Tx Team (Signed)
Initial Treatment Plan 03/23/2016 4:22 PM Steven ChangRoger L Police [id number]    PATIENT STRESSORS: Financial difficulties Health problems Substance abuse Caring for disabled uncle Unable to afford medications  PATIENT STRENGTHS: Active sense of humor Capable of independent living   PATIENT IDENTIFIED PROBLEMS: "Attitude, this self destructive behavior."  "Get my medication right."                   DISCHARGE CRITERIA:  Ability to meet basic life and health needs Adequate post-discharge living arrangements Improved stabilization in mood, thinking, and/or behavior Medical problems require only outpatient monitoring Motivation to continue treatment in a less acute level of care Need for constant or close observation no longer present Reduction of life-threatening or endangering symptoms to within safe limits Safe-care adequate arrangements made Verbal commitment to aftercare and medication compliance Withdrawal symptoms are absent or subacute and managed without 24-hour nursing intervention  PRELIMINARY DISCHARGE PLAN: Outpatient therapy  PATIENT/FAMILY INVOLVEMENT: This treatment plan has been presented to and reviewed with the patient, Steven Changoger L Boivin.  The patient and family have been given the opportunity to ask questions and make suggestions.  Lindajo Royalaniel P Trinnity Breunig, RN 03/23/2016, 4:22 PM

## 2016-03-23 NOTE — ED Provider Notes (Signed)
[facility]-EMERGENCY DEPT Provider Note   CSN: [id number] Arrival date & time: 03/23/16  0756     History   Chief Complaint Chief Complaint  Patient presents with  . Suicidal    HPI Steven [name] is a 45 y.o. male.  45 year old Caucasian male with a past medical history significant for COPD, bipolar disorder, hypertension, M.D. ED, substance abuse, alcohol abuse that presents to the ED today for depression and suicidal ideations. Patient states that "he just wants everything to end". Patient states he has thought about overdosing on heroin because he states "that we'll get the job done and it would look like an accident". Patient has a history of SI attempts in the past by cutting his wrist. Patient also reports severe depression and anxiety and uses alcohol to help him relieve his symptoms. Patient states that he drinks approximately a case of beer a day. States his last drink was yesterday afternoon when he consumes 12 beers. The [facility]. Patient admits to cocaine use 2 days ago along with marijuana. Patient states that he has been on psychiatric medication the past but has not been able to afford his medications in the past 2 weeks because of financial issues. Patient has not taken any of his prescribed medications. Patient denies any HI. He denies any auditory or visual hallucinations. Patient states he came to the ER today instead of killing himself.  Patient denies any fever, chills, headache, vision changes, lightheadedness, dizziness, chest pain, shortness of breath, abdominal pain, nausea, emesis, urinary symptoms, change in bowel habits, numbness/tingling, lower extremity edema. Denies any other complaints. Patient denies any pain at this time.  Patient is a multi pack year smoker and has a history of COPD. Has occasional cough that is baseline for his COPD. Denies any sputum productions, wheezes, orthopnea, nocturnal  dyspnea.        Past Medical History:  Diagnosis Date  . Asthma   . Bipolar disorder (manic depression) (HCC)   . COPD (chronic obstructive pulmonary disease) (HCC)   . Coronary artery disease   . Hx of CABG   . Hypercholesteremia   . Hypertension   . Kidney stone   . Tobacco use disorder     Patient Active Problem List   Diagnosis Date Noted  . Polysubstance abuse 02/23/2016  . Opiate dependence (HCC) 02/23/2016  . Cocaine abuse with cocaine-induced mood disorder (HCC) 02/21/2016  . MDD (major depressive disorder), recurrent severe, without psychosis (HCC) 06/18/2015  . Substance induced mood disorder (HCC) 03/08/2015  . Suicidal ideation   . COPD (chronic obstructive pulmonary disease) (HCC) 02/09/2015  . Tobacco use disorder 02/09/2015  . Stimulant use disorder (cocaine) 02/09/2015  . Alcohol use disorder, severe, in sustained remission (HCC) 02/09/2015  . Ureteral stone 12/31/2014  . Claudication of left lower extremity (HCC) 08/14/2012  . CAD s/p CABG 2012 [facility] 11/28/2011  . Hyperlipidemia LDL goal <100 03/13/2007  . Essential hypertension 03/13/2007  . GERD 03/13/2007    Past Surgical History:  Procedure Laterality Date  . ABDOMINAL ANGIOGRAM  08/15/2012   Procedure: ABDOMINAL ANGIOGRAM;  Surgeon: Corky Crafts, MD;  Location: [facility] CATH LAB;  Service: Cardiovascular;;  . APPENDECTOMY    . CARDIOVERSION N/A 08/28/2012   Procedure: Pseudo Compression;  Surgeon: Chuck Hint, MD;  Location: [facility] CATH LAB;  Service: Cardiovascular;  Laterality: N/A;  . CORONARY ARTERY BYPASS GRAFT    . CYSTOSCOPY WITH RETROGRADE PYELOGRAM,  URETEROSCOPY AND STENT PLACEMENT Right 12/31/2014   Procedure: CYSTOSCOPY  RIGHT URETEROSCOPY WITH STONE RETREIVAL RIGHT RETROGRADE PYELOGRAM;  Surgeon: Malen GauzePatrick L McKenzie, MD;  Location: [facility] ORS;  Service: Urology;  Laterality: Right;  . KIDNEY STONE SURGERY    . LOWER EXTREMITY ANGIOGRAM N/A 08/15/2012   Procedure: LOWER  EXTREMITY ANGIOGRAM with possible PTA /stent;  Surgeon: Corky CraftsJayadeep S Varanasi, MD;  [facility];  Service: Cardiovascular;  Laterality: N/A;       Home Medications    Prior to Admission medications   Medication Sig Start Date End Date Taking? Authorizing Provider  ARIPiprazole (ABILIFY) 5 MG tablet Take 1 tablet (5 mg total) by mouth daily. 03/20/16   Thermon LeylandLaura A Davis, NP  busPIRone (BUSPAR) 5 MG tablet Take 1 tablet (5 mg total) by mouth 2 (two) times daily. 03/20/16   Thermon LeylandLaura A Davis, NP  FLUoxetine (PROZAC) 20 MG capsule Take 1 capsule (20 mg total) by mouth daily. 03/20/16   Thermon LeylandLaura A Davis, NP  hydrOXYzine (ATARAX/VISTARIL) 50 MG tablet Take 1 tablet (50 mg total) by mouth every 6 (six) hours as needed for anxiety. 03/01/16   Laveda AbbeLaurie Britton Parks, NP  nicotine (NICODERM CQ - DOSED IN MG/24 HOURS) 21 mg/24hr patch Place 1 patch (21 mg total) onto the skin daily. 03/20/16   Thermon LeylandLaura A Davis, NP  traZODone (DESYREL) 100 MG tablet Take 1 tablet (100 mg total) by mouth at bedtime. 03/20/16   Thermon LeylandLaura A Davis, NP    Family History Family History  Problem Relation Age of Onset  . Heart disease Father   . Hyperlipidemia Father   . Heart disease Maternal Grandmother     Social History Social History  Substance Use Topics  . Smoking status: Current Every Day Smoker    Packs/day: 1.00    Years: 31.00    Types: Cigarettes  . Smokeless tobacco: Former NeurosurgeonUser    Quit date: 04/27/2013  . Alcohol use Yes     Comment: last drink 03/07/2015      Allergies   Patient has no known allergies.   Review of Systems Review of Systems  Constitutional: Negative for chills and fever.  HENT: Negative for congestion, ear pain, rhinorrhea and sore throat.   Eyes: Negative for pain and discharge.  Respiratory: Negative for cough and shortness of breath.   Cardiovascular: Negative for chest pain and palpitations.  Gastrointestinal: Negative for abdominal pain, diarrhea, nausea and vomiting.    Genitourinary: Negative for flank pain, frequency, hematuria and urgency.  Musculoskeletal: Negative for myalgias and neck pain.  Neurological: Negative for dizziness, syncope, weakness, light-headedness, numbness and headaches.  Psychiatric/Behavioral: Positive for agitation, behavioral problems, sleep disturbance and suicidal ideas. Negative for hallucinations and self-injury. The patient is nervous/anxious.   All other systems reviewed and are negative.    Physical Exam Updated Vital Signs BP 179/91 (BP Location: Right Arm)   Pulse 92   Temp 97.7 F (36.5 C)   Resp 16   SpO2 94%   Physical Exam  Constitutional: He is oriented to person, place, and time. He appears well-developed and well-nourished. No distress.  HENT:  Head: Normocephalic and atraumatic.  Mouth/Throat: Oropharynx is clear and moist.  Eyes: Conjunctivae and EOM are normal. Pupils are equal, round, and reactive to light. Right eye exhibits no discharge. Left eye exhibits no discharge. No scleral icterus.  Neck: Normal range of motion. Neck supple. No thyromegaly present.  Cardiovascular: Normal rate, regular rhythm, normal heart sounds and intact distal pulses.  Exam reveals no gallop and no friction rub.   No murmur heard. Pulses:      Radial pulses are 2+ on the right side, and 2+ on the left side.       Dorsalis pedis pulses are 2+ on the right side, and 2+ on the left side.  Pulmonary/Chest: Effort normal and breath sounds normal. No respiratory distress. He has no wheezes.  Abdominal: Soft. Bowel sounds are normal. He exhibits no distension. There is no tenderness. There is no rebound and no guarding.  Musculoskeletal: Normal range of motion.  No lower extremity edema.  Lymphadenopathy:    He has no cervical adenopathy.  Neurological: He is alert and oriented to person, place, and time.  Skin: Skin is warm and dry. Capillary refill takes less than 2 seconds.  Psychiatric: He has a normal mood and affect.  His speech is normal and behavior is normal. Thought content is not paranoid and not delusional. He expresses suicidal ideation. He expresses no homicidal ideation. He expresses suicidal plans. He expresses no homicidal plans.  Nursing note and vitals reviewed.    ED Treatments / Results  Labs (all labs ordered are listed, but only abnormal results are displayed) Labs Reviewed  COMPREHENSIVE METABOLIC PANEL - Abnormal; Notable for the following:       Result Value   Glucose, Bld 144 (*)    BUN 24 (*)    All other components within normal limits  ACETAMINOPHEN LEVEL - Abnormal; Notable for the following:    Acetaminophen (Tylenol), Serum <10 (*)    All other components within normal limits  RAPID URINE DRUG SCREEN, HOSP PERFORMED - Abnormal; Notable for the following:    Cocaine POSITIVE (*)    Tetrahydrocannabinol POSITIVE (*)    All other components within normal limits  ETHANOL  SALICYLATE LEVEL  CBC    EKG  EKG Interpretation None       Radiology No results found.  Procedures Procedures (including critical care time)  Medications Ordered in ED Medications - No data to display   Initial Impression / Assessment and Plan / ED Course  I have reviewed the triage vital signs and the nursing notes.  Pertinent labs & imaging results that were available during my care of the patient were reviewed by me and considered in my medical decision making (see chart for details).  Clinical Course   The patient presents to the ED today with suicidal ideations. He has a history of suicide attempts in the past but denies any attempts at this time. He does endorse a plan for this suicide. Patient denies any HI or hallucinations at this time. Patient does have history EtOH cocaine, and marajuina use. Patient is a tobacco user states that he needs a nicotine patch. Patient also states that he drinks approximately 12 beers a day. I placed patient on CIWA protocol. Patient states his last  drink was yesterday afternoon. I feel patient is at risk for withdrawal however he has no symptoms currently.Patient with history of CAD, MI, CABG. Denies any shortness of breath or chest pain at this time. He is not currently taking his blood pressure medicine. Blood pressure was elevated in the ED due to poor medicine compliance. I have ordered a patient's home metoprolol. Pressures improved. Patient has no complaints at this time. Medical clearance labs were obtained and unremarkable. The patient is cleared from medical standpoint. TTS was counseled recommended inpatient treatment.The patient is hemodynamically stable and in no acute distress.  Final diagnoses:  MDD (major depressive disorder), recurrent severe, without psychosis (HCC)    New Prescriptions New Prescriptions   No medications on file     Rise Mu, PA-C 03/23/16 1800    Courteney Randall An, MD 03/27/16 2024

## 2016-03-23 NOTE — Plan of Care (Signed)
Problem: Safety: Goal: Periods of time without injury will increase Outcome: Progressing Pt. denies HI, Endorses passive SI but verbal contracts for safety at this time, remains a low fall risk, Q 15 checks in place.
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
## 2016-03-23 NOTE — ED Notes (Signed)
[facility]  [facility]  Provider notified.

## 2016-03-23 NOTE — Progress Notes (Addendum)
D: Pt. is up and visible in his room, resting bed. Pt. endorses passive SI but verbal contracts for safety. Denies having any Pain/HI/AVH at this time. Pt. presents with and anxious & depressed affect and mood. Pt. states "I just feel real anxious right now". Physician on call contacted and order was obtained for Vistaril. CIWA is 7 @ this time. Pt. is cooperative and appears to be receptive to treatment plan.    A: Encouragement and support given. Meds. ordered and given. PRNs Vistaril, Maalox, Trazodone and given. Will re-eval as necessary.   R: 1:1 interaction in private. Safety maintained with Q 15 checks. Continues to follow treatment plan and will monitor closely. No additional questions/concerns at this time.

## 2016-03-23 NOTE — ED Triage Notes (Signed)
Patient with history of bipolar arrives today with depression and suicidal thoughts.  Denies homicidal ideation.  Does not have a specific plan for suicide but is thinking about something that "will get the job done."  Patient admits to using ETOH and cocaine.  Last use was day before yesterday 03/21/16.  Also reports taking several puffs of weed yesterday.  Drinks a 12 pack and corrected his story by saying he drank a 12 pack yesterday.  Patient reports he does not have the resources to get his medications.  Has been off of them for a month.

## 2016-03-23 NOTE — ED Notes (Signed)
[facility] notified that patient's BP has decreased.  [facility] called for transport.

## 2016-03-23 NOTE — [facility] Suicide Risk Assessment (Signed)
[facility]   Nursing information obtained from:    Demographic factors:    Current Mental Status:    Loss Factors:    Historical Factors:    Risk Reduction Factors:     Total Time spent with patient: 30 minutes Principal Problem: MDD (major depressive disorder), recurrent severe, without psychosis (HCC) Diagnosis:   Patient Active Problem List   Diagnosis Date Noted  . Polysubstance abuse [F19.10] 02/23/2016  . Opiate dependence (HCC) [F11.20] 02/23/2016  . 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 MDD (major depressive disorder), recurrent severe, without psychosis (HCC) [F33.2] 06/18/2015  . Substance induced mood disorder (HCC) [F19.94] 03/08/2015  . Suicidal ideation [R45.851]   . COPD (chronic obstructive pulmonary disease) (HCC) [J44.9] 02/09/2015  . Tobacco use disorder [F17.200] 02/09/2015  . Stimulant use disorder (cocaine) [F15.90] 02/09/2015  . Alcohol use disorder, severe, in sustained remission (HCC) [F10.21] 02/09/2015  . Ureteral stone [N20.1] 12/31/2014  . 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 CAD s/p CABG 2012 [facility] [I25.810] 11/28/2011  . Hyperlipidemia LDL goal <100 [E78.5] 03/13/2007  . Essential hypertension [I10] 03/13/2007  . GERD [K21.9] 03/13/2007   Subjective Data: he denies current suicidal or homicidal ideation, plan or intent  Continued Clinical Symptoms:  Alcohol Use Disorder Identification Test Final Score (AUDIT): 32 The "Alcohol Use Disorders Identification Test", Guidelines for Use in Primary Care, Second Edition.  World Science writerHealth Organization [facility](WHO). Score between 0-7:  no or low risk or alcohol related problems. Score between 8-15:  moderate risk of alcohol related problems. Score between 16-19:  high risk of alcohol related problems. Score 20 or above:  warrants further diagnostic evaluation for alcohol dependence and treatment.   CLINICAL  FACTORS:   Alcohol/Substance Abuse/Dependencies   Musculoskeletal: Strength & Muscle Tone: within normal limits Gait & Station: normal Patient leans: N/A  Psychiatric Specialty Exam: Physical Exam  ROS  Blood pressure 118/71, pulse 79, resp. rate 18, height 5' 7.5" (1.715 m), weight 99.3 kg (219 lb).Body mass index is 33.79 kg/m.    General Appearance: Casual  Eye Contact:  Good  Speech:  Clear and Coherent  Volume:  Normal  Mood:  Euthymic  Affect:  Facetious  Thought Process:  Irrelevant  Orientation:  Full (Time, Place, and Person)  Thought Content:  Negative  Suicidal Thoughts:  No  Homicidal Thoughts:  No  Memory:  Immediate;   Poor Recent;   Fair  Judgement:  Impaired  Insight:  Shallow  Psychomotor Activity:  Normal  Concentration:  Concentration: Fair and Attention Span: Fair  Recall:  [employer]  Fund of Knowledge:  Good  Language:  Good  Akathisia:  No  Handed:  Right  AIMS (if indicated):   0  Assets:  Resilience  ADL's:  Intact  Cognition:  WNL  Sleep:       COGNITIVE FEATURES THAT CONTRIBUTE TO RISK:  Loss of executive function    SUICIDE RISK:   Mild:  Suicidal ideation of limited frequency, intensity, duration, and specificity.  There are no identifiable plans, no associated intent, mild dysphoria and related symptoms, good self-control (both objective and subjective assessment), few other risk factors, and identifiable protective factors, including available and accessible social support.   PLAN OF CARE: see PAA  I certify that inpatient services furnished can reasonably be expected to improve the patient's condition.  Acquanetta SitElizabeth Woods Korene Dula, MD 03/23/2016, 4:14 PM

## 2016-03-24 MED ORDER — LORAZEPAM 1 MG PO TABS
1.0000 mg | ORAL_TABLET | Freq: Four times a day (QID) | ORAL | Status: AC | PRN
  Administered 2016-03-24 – 2016-03-26 (×7): 1 mg via ORAL
  Filled 2016-03-24 (×7): qty 1

## 2016-03-24 MED ORDER — ONDANSETRON HCL 4 MG PO TABS
8.0000 mg | ORAL_TABLET | Freq: Three times a day (TID) | ORAL | Status: DC | PRN
  Administered 2016-03-24 – 2016-03-26 (×4): 8 mg via ORAL
  Filled 2016-03-24 (×4): qty 2

## 2016-03-24 MED ORDER — HYDROXYZINE HCL 50 MG PO TABS
50.0000 mg | ORAL_TABLET | Freq: Three times a day (TID) | ORAL | Status: DC | PRN
  Administered 2016-03-24 – 2016-03-27 (×10): 50 mg via ORAL
  Filled 2016-03-24 (×11): qty 1

## 2016-03-24 NOTE — [facility] Suicide Risk Assessment (Signed)
[facility] INPATIENT:  Family/Significant Other Suicide Prevention Education  Suicide Prevention Education:  Patient Refusal for Family/Significant Other Suicide Prevention Education: The patient Steven [name] has refused to provide written consent for family/significant other to be provided Family/Significant Other Suicide Prevention Education during admission and/or prior to discharge.  Physician notified.  SPE completed with pt, as pt refused to consent to family contact. SPI pamphlet provided to pt and pt was encouraged to share information with support network, ask questions, and talk about any concerns relating to SPE. Pt denies access to guns/firearms and verbalized understanding of information provided. Mobile Crisis information also provided to pt.   Joliyah Lippens N Smart LCSW 03/24/2016, 10:55 AM
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
## 2016-03-24 NOTE — Progress Notes (Signed)
[facility] MD Progress Note  03/24/2016 10:37 AM  Patient Active Problem List   Diagnosis Date Noted  . Polysubstance abuse 02/23/2016  . Opiate dependence ([place]) 02/23/2016  . Cocaine abuse with cocaine-induced mood disorder ([place]) 02/21/2016  . MDD (major depressive disorder), recurrent severe, without psychosis ([place]) 06/18/2015  . Substance induced mood disorder ([place]) 03/08/2015  . Suicidal ideation   . COPD (chronic obstructive pulmonary disease) ([place]) 02/09/2015  . Tobacco use disorder 02/09/2015  . Stimulant use disorder (cocaine) 02/09/2015  . Alcohol use disorder, severe, in sustained remission ([place]) 02/09/2015  . Ureteral stone 12/31/2014  . Claudication of left lower extremity ([place]) 08/14/2012  . CAD s/p CABG 2012 [facility] 11/28/2011  . Hyperlipidemia LDL goal <100 03/13/2007  . Essential hypertension 03/13/2007  . GERD 03/13/2007    Diagnosis:History of using opiates, cocaine and alcohol  Subjective: Patient complains of anxiety, poor sleep, sweating and throughout this morning which she feels may be related to alcohol withdrawal. Is unclear if patient has been consuming alcohol in large amounts regularly recently but we will place the patient on Ativan when necessary and order  Zofran that the patient decided he did not want yesterday for nausea and vomiting and patient agrees with this plan. Patient does deny any suicidal or homicidal ideation, plan or intent. He describes his mood as being somewhat anxious. Nursing reports that he did refuse his metformin this morning stating he did not know why it was ordered.  Objective: Well-developed well-nourished man who is in no apparent distress but does look slightly anxious, mood is described as anxious and affect is congruent speech and motor do appear within normal limits thought processes are linear and goal-directed thought content denies any suicidal or homicidal ideation, plan or intent, alert and oriented 3, IQ appears an  low average range insight and judgment are limited  Nursing reports that CIWAs have been running around 6-7 Aims exam yesterday was 0   Current Facility-Administered Medications (Endocrine & Metabolic):  .  metFORMIN (GLUCOPHAGE) tablet 500 mg   Current Facility-Administered Medications (Cardiovascular):  .  lisinopril (PRINIVIL,ZESTRIL) tablet 20 mg   Current Facility-Administered Medications (Respiratory):  .  albuterol (PROVENTIL HFA;VENTOLIN HFA) 108 (90 Base) MCG/ACT inhaler 2 puff   Current Facility-Administered Medications (Analgesics):  .  acetaminophen (TYLENOL) tablet 650 mg     Current Facility-Administered Medications (Other):  .  alum & mag hydroxide-simeth (MAALOX/MYLANTA) 200-200-20 MG/5ML suspension 30 mL .  ARIPiprazole (ABILIFY) tablet 5 mg .  FLUoxetine (PROZAC) capsule 20 mg .  hydrOXYzine (ATARAX/VISTARIL) tablet 25 mg .  Influenza vac split quadrivalent PF (FLUARIX) injection 0.5 mL .  LORazepam (ATIVAN) tablet 1 mg .  magnesium hydroxide (MILK OF MAGNESIA) suspension 30 mL .  nicotine (NICODERM CQ - dosed in mg/24 hours) patch 21 mg .  ondansetron (ZOFRAN) tablet 8 mg .  traZODone (DESYREL) tablet 50 mg  No current outpatient prescriptions on file.  Vital Signs:Blood pressure 122/79, pulse 93, temperature 97.6 F (36.4 C), temperature source Oral, resp. rate 20, height 5' 7.5" (1.715 m), weight 99.3 kg (219 lb).    Lab Results:  Results for orders placed or performed during the hospital encounter of 03/23/16 (from the past 48 hour(s))  Rapid urine drug screen (hospital performed)     Status: Abnormal   Collection Time: 03/23/16  8:52 AM  Result Value Ref Range   Opiates NONE DETECTED NONE DETECTED   Cocaine POSITIVE (A) NONE DETECTED   Benzodiazepines NONE DETECTED NONE DETECTED  Amphetamines NONE DETECTED NONE DETECTED   Tetrahydrocannabinol POSITIVE (A) NONE DETECTED   Barbiturates NONE DETECTED NONE DETECTED    Comment:        DRUG  SCREEN FOR MEDICAL PURPOSES ONLY.  IF CONFIRMATION IS NEEDED FOR ANY PURPOSE, NOTIFY LAB WITHIN 5 DAYS.        LOWEST DETECTABLE LIMITS FOR URINE DRUG SCREEN Drug Class       Cutoff (ng/mL) Amphetamine      1000 Barbiturate      200 Benzodiazepine   809 Tricyclics       983 Opiates          300 Cocaine          300 THC              50   Ethanol     Status: None   Collection Time: 03/23/16  9:08 AM  Result Value Ref Range   Alcohol, Ethyl (B) <5 <5 mg/dL    Comment:        LOWEST DETECTABLE LIMIT FOR SERUM ALCOHOL IS 5 mg/dL FOR MEDICAL PURPOSES ONLY   Salicylate level     Status: None   Collection Time: 03/23/16  9:08 AM  Result Value Ref Range   Salicylate Lvl <3.8 2.8 - 30.0 mg/dL  Acetaminophen level     Status: Abnormal   Collection Time: 03/23/16  9:08 AM  Result Value Ref Range   Acetaminophen (Tylenol), Serum <10 (L) 10 - 30 ug/mL    Comment:        THERAPEUTIC CONCENTRATIONS VARY SIGNIFICANTLY. A RANGE OF 10-30 ug/mL MAY BE AN EFFECTIVE CONCENTRATION FOR MANY PATIENTS. HOWEVER, SOME ARE BEST TREATED AT CONCENTRATIONS OUTSIDE THIS RANGE. ACETAMINOPHEN CONCENTRATIONS >150 ug/mL AT 4 HOURS AFTER INGESTION AND >50 ug/mL AT 12 HOURS AFTER INGESTION ARE OFTEN ASSOCIATED WITH TOXIC REACTIONS.   Comprehensive metabolic panel     Status: Abnormal   Collection Time: 03/23/16  9:17 AM  Result Value Ref Range   Sodium 136 135 - 145 mmol/L   Potassium 3.9 3.5 - 5.1 mmol/L   Chloride 102 101 - 111 mmol/L   CO2 25 22 - 32 mmol/L   Glucose, Bld 144 (H) 65 - 99 mg/dL   BUN 24 (H) 6 - 20 mg/dL   Creatinine, Ser 1.10 0.61 - 1.24 mg/dL   Calcium 9.3 8.9 - 10.3 mg/dL   Total Protein 7.6 6.5 - 8.1 g/dL   Albumin 4.3 3.5 - 5.0 g/dL   AST 36 15 - 41 U/L   ALT 39 17 - 63 U/L   Alkaline Phosphatase 78 38 - 126 U/L   Total Bilirubin 0.8 0.3 - 1.2 mg/dL   GFR calc non Af Amer >60 >60 mL/min   GFR calc Af Amer >60 >60 mL/min    Comment: (NOTE) The eGFR has been  calculated using the CKD EPI equation. This calculation has not been validated in all clinical situations. eGFR's persistently <60 mL/min signify possible Chronic Kidney Disease.    Anion gap 9 5 - 15  cbc     Status: None   Collection Time: 03/23/16  9:17 AM  Result Value Ref Range   WBC 7.6 4.0 - 10.5 K/uL   RBC 4.91 4.22 - 5.81 MIL/uL   Hemoglobin 16.0 13.0 - 17.0 g/dL   HCT 46.8 39.0 - 52.0 %   MCV 95.3 78.0 - 100.0 fL   MCH 32.6 26.0 - 34.0 pg   MCHC 34.2 30.0 - 36.0 g/dL  RDW 15.1 11.5 - 15.5 %   Platelets 252 150 - 400 K/uL    Physical Findings: AIMS: Facial and Oral Movements Muscles of Facial Expression: None, normal Lips and Perioral Area: None, normal Jaw: None, normal Tongue: None, normal,Extremity Movements Upper (arms, wrists, hands, fingers): None, normal Lower (legs, knees, ankles, toes): None, normal, Trunk Movements Neck, shoulders, hips: None, normal, Overall Severity Severity of abnormal movements (highest score from questions above): None, normal Incapacitation due to abnormal movements: None, normal Patient's awareness of abnormal movements (rate only patient's report): No Awareness, Dental Status Current problems with teeth and/or dentures?: No Does patient usually wear dentures?: No  CIWA:  CIWA-Ar Total: 7 COWS:      Assessment/Plan: We will order when necessary Ativan for CIWA greater than 10. Will increase Vistaril to 50 mg by mouth  when necessary for anxiety. Order Zofran when necessary for nausea and vomiting. Continued to to monitor patient's condition and patient will continue to work with Education officer, museum for resources for ongoing treatment after discharge.  Linard Millers, MD 03/24/2016, 10:37 AM

## 2016-03-24 NOTE — Progress Notes (Signed)
D    Pt endorses depression and anxiety   He tends to isolate and has some medication seeking behaviors   He was requesting medication that he just had 2 hours ago from another nurse   He did not attend AA group and has minimal insight into his addiction issues A    Verbal support given   Medications administered and effectiveness monitored   Q 15 min checks R   Pt is safe at present time and receptive to verbal support

## 2016-03-24 NOTE — Progress Notes (Signed)
Recreation Therapy Notes  Date: 03/24/16 Time: 0930 Location: [address] Dayroom  Group Topic: Stress Management  Goal Area(s) Addresses:  Patient will verbalize importance of using healthy stress management.  Patient will identify positive emotions associated with healthy stress management.   Intervention: Stress Management  Activity :  Progressive Muscle Relaxation.  LRT introduced the stress management technique of progressive muscle relaxation.  LRT read a script to guide patients through the technique.  Patients were to follow along as LRT read script.  Education:  Stress Management, Discharge Planning.   Education Outcome: Acknowledges edcuation/In group clarification offered/Needs additional education  Clinical Observations/Feedback: Pt did not attend  group.    Tianah Lonardo, LRT/CTRS         Reace Breshears A 03/24/2016 11:42 AM 

## 2016-03-24 NOTE — Tx Team (Signed)
Interdisciplinary Treatment and Diagnostic Plan Update  03/24/2016 Time of Session: 9:30AM Billey ChangRoger L Oberholzer MRN: [id number]  Principal Diagnosis: MDD (major depressive disorder), recurrent severe, without psychosis (HCC)  Secondary Diagnoses: Principal Problem:   MDD (major depressive disorder), recurrent severe, without psychosis (HCC)   Current Medications:  Current Facility-Administered Medications  Medication Dose Route Frequency Provider Last Rate Last Dose  . acetaminophen (TYLENOL) tablet 650 mg  650 mg Oral Q6H PRN Kristeen MansFran E Hobson, NP      . albuterol (PROVENTIL HFA;VENTOLIN HFA) 108 (90 Base) MCG/ACT inhaler 2 puff  2 puff Inhalation Q6H PRN Acquanetta SitElizabeth Woods Oates, MD      . alum & mag hydroxide-simeth (MAALOX/MYLANTA) 200-200-20 MG/5ML suspension 30 mL  30 mL Oral Q4H PRN Kristeen MansFran E Hobson, NP   30 mL at 03/23/16 2150  . ARIPiprazole (ABILIFY) tablet 5 mg  5 mg Oral Daily Acquanetta SitElizabeth Woods Oates, MD      . FLUoxetine (PROZAC) capsule 20 mg  20 mg Oral Daily Acquanetta SitElizabeth Woods Oates, MD      . hydrOXYzine (ATARAX/VISTARIL) tablet 25 mg  25 mg Oral TID PRN Jackelyn PolingJason A Berry, NP   25 mg at 03/24/16 0423  . Influenza vac split quadrivalent PF (FLUARIX) injection 0.5 mL  0.5 mL Intramuscular Tomorrow-1000 Acquanetta SitElizabeth Woods Oates, MD      . lisinopril (PRINIVIL,ZESTRIL) tablet 20 mg  20 mg Oral Daily Acquanetta SitElizabeth Woods Oates, MD      . magnesium hydroxide (MILK OF MAGNESIA) suspension 30 mL  30 mL Oral Daily PRN Kristeen MansFran E Hobson, NP      . metFORMIN (GLUCOPHAGE) tablet 500 mg  500 mg Oral Q breakfast Acquanetta SitElizabeth Woods Oates, MD      . nicotine (NICODERM CQ - dosed in mg/24 hours) patch 21 mg  21 mg Transdermal Daily Acquanetta SitElizabeth Woods Oates, MD      . traZODone (DESYREL) tablet 50 mg  50 mg Oral QHS PRN Acquanetta SitElizabeth Woods Oates, MD   50 mg at 03/23/16 2103   PTA Medications: Prescriptions Prior to Admission  Medication Sig Dispense Refill Last Dose  . ARIPiprazole (ABILIFY) 5 MG tablet Take 1 tablet (5 mg total) by  mouth daily. 14 tablet  unknown  . busPIRone (BUSPAR) 5 MG tablet Take 1 tablet (5 mg total) by mouth 2 (two) times daily. 14 tablet 0 unknown  . FLUoxetine (PROZAC) 20 MG capsule Take 1 capsule (20 mg total) by mouth daily. 14 capsule 0 unknown  . hydrOXYzine (ATARAX/VISTARIL) 50 MG tablet Take 1 tablet (50 mg total) by mouth every 6 (six) hours as needed for anxiety. 14 tablet 0 unknown  . nicotine (NICODERM CQ - DOSED IN MG/24 HOURS) 21 mg/24hr patch Place 1 patch (21 mg total) onto the skin daily. 28 patch 0 unknown  . traZODone (DESYREL) 100 MG tablet Take 1 tablet (100 mg total) by mouth at bedtime. 14 tablet 0 unknown    Patient Stressors: Financial difficulties Health problems Substance abuse  Patient Strengths: Active sense of humor Capable of independent living  Treatment Modalities: Medication Management, Group therapy, Case management,  1 to 1 session with clinician, Psychoeducation, Recreational therapy.   Physician Treatment Plan for Primary Diagnosis: MDD (major depressive disorder), recurrent severe, without psychosis (HCC) Long Term Goal(s): Improvement in symptoms so as ready for discharge Improvement in symptoms so as ready for discharge   Short Term Goals: Ability to demonstrate self-control will improve Compliance with prescribed medications will improve Ability to identify changes in lifestyle to reduce recurrence  of condition will improve Ability to identify and develop effective coping behaviors will improve Ability to identify triggers associated with substance abuse/mental health issues will improve  Medication Management: Evaluate patient's response, side effects, and tolerance of medication regimen.  Therapeutic Interventions: 1 to 1 sessions, Unit Group sessions and Medication administration.  Evaluation of Outcomes: Progressing  Physician Treatment Plan for Secondary Diagnosis: Principal Problem:   MDD (major depressive disorder), recurrent severe,  without psychosis (HCC)  Long Term Goal(s): Improvement in symptoms so as ready for discharge Improvement in symptoms so as ready for discharge   Short Term Goals: Ability to demonstrate self-control will improve Compliance with prescribed medications will improve Ability to identify changes in lifestyle to reduce recurrence of condition will improve Ability to identify and develop effective coping behaviors will improve Ability to identify triggers associated with substance abuse/mental health issues will improve     Medication Management: Evaluate patient's response, side effects, and tolerance of medication regimen.  Therapeutic Interventions: 1 to 1 sessions, Unit Group sessions and Medication administration.  Evaluation of Outcomes: Progressing   RN Treatment Plan for Primary Diagnosis: MDD (major depressive disorder), recurrent severe, without psychosis (HCC) Long Term Goal(s): Knowledge of disease and therapeutic regimen to maintain health will improve  Short Term Goals: Ability to remain free from injury will improve, Ability to disclose and discuss suicidal ideas and Ability to identify and develop effective coping behaviors will improve  Medication Management: RN will administer medications as ordered by provider, will assess and evaluate patient's response and provide education to patient for prescribed medication. RN will report any adverse and/or side effects to prescribing provider.  Therapeutic Interventions: 1 on 1 counseling sessions, Psychoeducation, Medication administration, Evaluate responses to treatment, Monitor vital signs and CBGs as ordered, Perform/monitor CIWA, COWS, AIMS and Fall Risk screenings as ordered, Perform wound care treatments as ordered.  Evaluation of Outcomes: Progressing   LCSW Treatment Plan for Primary Diagnosis: MDD (major depressive disorder), recurrent severe, without psychosis (HCC) Long Term Goal(s): Safe transition to appropriate next  level of care at discharge, Engage patient in therapeutic group addressing interpersonal concerns.  Short Term Goals: Engage patient in aftercare planning with referrals and resources, Facilitate patient progression through stages of change regarding substance use diagnoses and concerns and Identify triggers associated with mental health/substance abuse issues  Therapeutic Interventions: Assess for all discharge needs, 1 to 1 time with Social worker, Explore available resources and support systems, Assess for adequacy in community support network, Educate family and significant other(s) on suicide prevention, Complete Psychosocial Assessment, Interpersonal group therapy.  Evaluation of Outcomes: Progressing   Progress in Treatment: Attending groups: No. New to unit. Continuing to assess.  Participating in groups: No. Taking medication as prescribed: Yes. Toleration medication: Yes. Family/Significant other contact made: No, will contact:  family member if patient consents. Patient understands diagnosis: Yes. Discussing patient identified problems/goals with staff: Yes. Medical problems stabilized or resolved: Yes. Denies suicidal/homicidal ideation: No. Passive SI/Able to contract for safety on the unit.  Issues/concerns per patient self-inventory: No. Other: n/a  New problem(s) identified: No, Describe:  n/a  New Short Term/Long Term Goal(s): medication stabilization, detox, development of effective aftercare plan.   Discharge Plan or Barriers:  CSW assessing for appropriate referrals. During pt's last admission on [address] 06/2015, he was referred to [facility] for further inpatient treatment. Pt reports that he has not been following up with o/p recommendations for mental health treatment in the past several months. Pt has been admitted  to [facility] OBS unit  3x in the past year in addition.   Reason for Continuation of Hospitalization: Depression Medication stabilization Suicidal  ideation Withdrawal symptoms  Estimated Length of Stay: 3-5 days   Attendees: Patient: 03/24/2016 8:26 AM  Physician: Dr. Vallery RidgeElizabeth Oates MD 03/24/2016 8:26 AM  Nursing: Gilda CreaseBobbie; Jane RN 03/24/2016 8:26 AM  RN Care Manager: Onnie BoerJennifer Clark CM 03/24/2016 8:26 AM  Social Worker: Trula SladeHeather Smart, LCSW 03/24/2016 8:26 AM  Recreational Therapist:  03/24/2016 8:26 AM  Other: Gray BernhardtMay Augustin NP; Contrad Withrow NP 03/24/2016 8:26 AM  Other:  03/24/2016 8:26 AM  Other: 03/24/2016 8:26 AM    Scribe for Treatment Team: Ledell PeoplesHeather N Smart, LCSW 03/24/2016 8:26 AM

## 2016-03-24 NOTE — Progress Notes (Signed)
Patient has spent much of day in bed, complains of ETOH withdrawal symptoms currently. Stated that he has detoxed before and had 18 months sober. Denies current SI.

## 2016-03-24 NOTE — Progress Notes (Signed)
Patient did not attend the evening speaker AA meeting. Pt was notified that group was beginning but remained in bed.   

## 2016-03-24 NOTE — Progress Notes (Signed)
CSW met with pt individually to discuss aftercare plan. Patient reports that he is here partly to help with getting approved for disability. He requested referral to PCP through [facility] health and Wellness. Patient plans to return to motel or to a family member's home at discharge and will follow-up at [facility] for outpatient mental health services. He was provided with [employer] pamphlet, AA list, and IRC/PATH pamphlet. CSW emailed PATH team lead Izora Gala, requesting that a PATH member assess patient before he discharges next week for services. Patient was receptive and agreeable to this. He declined referral for inpatient treatment. "I have too much to do. I have to meet with my disability lawyer and go to the [facility] and get set up with an address."   Maxie Better, MSW, LCSW Clinical Social Worker 03/24/2016 2:24 PM

## 2016-03-24 NOTE — [facility] Group Notes (Signed)
[facility] LCSW Group Therapy  03/24/2016 2:21 PM  Type of Therapy:  Group Therapy  Participation Level:  Did Not Attend-pt invited. Chose to remain in bed.   Summary of Progress/Problems: Feelings around Relapse. Group members discussed the meaning of relapse and shared personal stories of relapse, how it affected them and others, and how they perceived themselves during this time. Group members were encouraged to identify triggers, warning signs and coping skills used when facing the possibility of relapse. Social supports were discussed and explored in detail. Post Acute Withdrawal Syndrome (handout provided) was introduced and examined. Pt's were encouraged to ask questions, talk about key points associated with PAWS, and process this information in terms of relapse prevention.   Yoneko Talerico N Smart LCSW 03/24/2016, 2:21 PM

## 2016-03-25 DIAGNOSIS — Z79899 Other long term (current) drug therapy: Secondary | ICD-10-CM

## 2016-03-25 DIAGNOSIS — F332 Major depressive disorder, recurrent severe without psychotic features: Principal | ICD-10-CM

## 2016-03-25 DIAGNOSIS — F17210 Nicotine dependence, cigarettes, uncomplicated: Secondary | ICD-10-CM

## 2016-03-25 DIAGNOSIS — Z8349 Family history of other endocrine, nutritional and metabolic diseases: Secondary | ICD-10-CM

## 2016-03-25 DIAGNOSIS — Z8249 Family history of ischemic heart disease and other diseases of the circulatory system: Secondary | ICD-10-CM

## 2016-03-25 MED ORDER — FLUOXETINE HCL 20 MG PO CAPS
40.0000 mg | ORAL_CAPSULE | Freq: Every day | ORAL | Status: DC
  Administered 2016-03-26 – 2016-03-27 (×2): 40 mg via ORAL
  Filled 2016-03-25 (×4): qty 2

## 2016-03-25 MED ORDER — QUETIAPINE FUMARATE 50 MG PO TABS
50.0000 mg | ORAL_TABLET | Freq: Once | ORAL | Status: AC
  Administered 2016-03-25: 50 mg via ORAL
  Filled 2016-03-25 (×2): qty 1

## 2016-03-25 MED ORDER — PROMETHAZINE HCL 25 MG/ML IJ SOLN
12.5000 mg | Freq: Once | INTRAMUSCULAR | Status: AC
  Administered 2016-03-25: 12.5 mg via INTRAMUSCULAR
  Filled 2016-03-25 (×2): qty 1

## 2016-03-25 NOTE — [facility] Group Notes (Signed)
[facility] Group Notes: (Clinical Social Work)   03/25/2016      Type of Therapy:  Group Therapy   Participation Level:  Did Not Attend despite MHT prompting   Ace Bergfeld Grossman-Orr, LCSW 03/25/2016, 12:01 PM     

## 2016-03-25 NOTE — Progress Notes (Signed)
D: Patient's self inventory sheet: patient has good sleep, did not recieve sleep medication.good  Appetite, normal energy level, poor concentration. Rated depression 9/10, hopeless 8/10, anxiety 8/10. SI/HI/AVH: endorses intermittent passive SI. Physical complaints are denied. Goal is "to stay here". Plans to work on (no response). Pt is isolative and minimally participative.   A: Medications administered, assessed medication knowledge and education given on medication regimen.  Emotional support and encouragement given patient. R: Endorses intermittent passive SI and denies HI , contracts for safety. Safety maintained with 15 minute checks.

## 2016-03-25 NOTE — Progress Notes (Signed)
[facility] MD Progress Note  03/25/2016 4:03 PM Steven [name]  MRN:  [id number] Subjective:  Patient reports " I am okay, just having hot and cold sweating episodes."  Objectives: Steven [name] is awake, alert and oriented X4 seen resting in bedroom. Denies suicidal or homicidal ideation. Pt states passive thoughts of suicidal ideations. Denies auditory or visual hallucination and does not appear to be responding to internal stimuli. Patient reports he is medication compliant without mediation side effects.   States his depression 8/10. Reports good appetite and reports resting well. Support, encouragement and reassurance was provided.   Principal Problem: MDD (major depressive disorder), recurrent severe, without psychosis (HCC) Diagnosis:   Patient Active Problem List   Diagnosis Date Noted  . Polysubstance abuse [F19.10] 02/23/2016  . Opiate dependence (HCC) [F11.20] 02/23/2016  . 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 MDD (major depressive disorder), recurrent severe, without psychosis (HCC) [F33.2] 06/18/2015  . Substance induced mood disorder (HCC) [F19.94] 03/08/2015  . Suicidal ideation [R45.851]   . COPD (chronic obstructive pulmonary disease) (HCC) [J44.9] 02/09/2015  . Tobacco use disorder [F17.200] 02/09/2015  . Stimulant use disorder (cocaine) [F15.90] 02/09/2015  . Alcohol use disorder, severe, in sustained remission (HCC) [F10.21] 02/09/2015  . Ureteral stone [N20.1] 12/31/2014  . 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 CAD s/p CABG 2012 [facility] [I25.810] 11/28/2011  . Hyperlipidemia LDL goal <100 [E78.5] 03/13/2007  . Essential hypertension [I10] 03/13/2007  . GERD [K21.9] 03/13/2007   Total Time spent with patient: 30 minutes  Past Psychiatric History:   Past Medical History:  Past Medical History:  Diagnosis Date  . Asthma   . Bipolar disorder (manic depression) (HCC)   . COPD (chronic  obstructive pulmonary disease) (HCC)   . Coronary artery disease   . Hx of CABG   . Hypercholesteremia   . Hypertension   . Kidney stone   . Tobacco use disorder     Past Surgical History:  Procedure Laterality Date  . ABDOMINAL ANGIOGRAM  08/15/2012   Procedure: ABDOMINAL ANGIOGRAM;  Surgeon: Corky Crafts, MD;  Location: [facility] CATH LAB;  Service: Cardiovascular;;  . APPENDECTOMY    . CARDIOVERSION N/A 08/28/2012   Procedure: Pseudo Compression;  Surgeon: Chuck Hint, MD;  Location: [facility] CATH LAB;  Service: Cardiovascular;  Laterality: N/A;  . CORONARY ARTERY BYPASS GRAFT    . CYSTOSCOPY WITH RETROGRADE PYELOGRAM, URETEROSCOPY AND STENT PLACEMENT Right 12/31/2014   Procedure: CYSTOSCOPY  RIGHT URETEROSCOPY WITH STONE RETREIVAL RIGHT RETROGRADE PYELOGRAM;  Surgeon: Malen Gauze, MD;  Location: [facility] ORS;  Service: Urology;  Laterality: Right;  . KIDNEY STONE SURGERY    . LOWER EXTREMITY ANGIOGRAM N/A 08/15/2012   Procedure: LOWER EXTREMITY ANGIOGRAM with possible PTA /stent;  Surgeon: Corky Crafts, MD;  Location: [facility] CATH LAB;  Service: Cardiovascular;  Laterality: N/A;   Family History:  Family History  Problem Relation Age of Onset  . Heart disease Father   . Hyperlipidemia Father   . Heart disease Maternal Grandmother    Family Psychiatric  History:  Social History:  History  Alcohol Use  . Yes     History  Drug Use  . Types: Cocaine, Marijuana    Comment: 10/23//2016    Social History   Social History  . Marital status: Divorced    Spouse name: N/A  . Number of children: N/A  . Years of education: N/A   Social History Main Topics  .  Smoking status: Current Every Day Smoker    Packs/day: 1.00    Years: 31.00    Types: Cigarettes  . Smokeless tobacco: Former NeurosurgeonUser    Quit date: 04/27/2013  . Alcohol use Yes  . Drug use:     Types: Cocaine, Marijuana     Comment: 10/23//2016  . Sexual activity: Yes    Birth control/ protection: None   Other  Topics Concern  . None   Social History Narrative  . None   Additional Social History:    Pain Medications: denies Prescriptions: denies Over the Counter: denies History of alcohol / drug use?: Yes Longest period of sobriety (when/how long): 18 months from 2015-2016 Negative Consequences of Use: Financial, Legal, Personal relationships Withdrawal Symptoms: Tremors, Nausea / Vomiting, Sweats Name of Substance 1: Alcohol 1 - Age of First Use: Teenager 1 - Amount (size/oz): 12-24 beers 1 - Frequency: daily 1 - Duration: on-going 1 - Last Use / Amount: yesterday/12 pack Name of Substance 2: Cocaine 2 - Age of First Use: in his 6620s 2 - Amount (size/oz): varies 2 - Frequency: twice weekly 2 - Duration: ongoing 2 - Last Use / Amount: day before yesterday                Sleep: Good  Appetite:  Fair  Current Medications: Current Facility-Administered Medications  Medication Dose Route Frequency Provider Last Rate Last Dose  . acetaminophen (TYLENOL) tablet 650 mg  650 mg Oral Q6H PRN Steven MansFran E Hobson, NP   650 mg at 03/25/16 1602  . albuterol (PROVENTIL HFA;VENTOLIN HFA) 108 (90 Base) MCG/ACT inhaler 2 puff  2 puff Inhalation Q6H PRN Steven SitElizabeth Woods Oates, MD   2 puff at 03/24/16 1712  . alum & mag hydroxide-simeth (MAALOX/MYLANTA) 200-200-20 MG/5ML suspension 30 mL  30 mL Oral Q4H PRN Steven MansFran E Hobson, NP   30 mL at 03/25/16 1101  . ARIPiprazole (ABILIFY) tablet 5 mg  5 mg Oral Daily Steven SitElizabeth Woods Oates, MD   5 mg at 03/25/16 0856  . FLUoxetine (PROZAC) capsule 20 mg  20 mg Oral Daily Steven SitElizabeth Woods Oates, MD   20 mg at 03/25/16 0856  . hydrOXYzine (ATARAX/VISTARIL) tablet 50 mg  50 mg Oral TID PRN Steven SitElizabeth Woods Oates, MD   50 mg at 03/25/16 1101  . Influenza vac split quadrivalent PF (FLUARIX) injection 0.5 mL  0.5 mL Intramuscular Tomorrow-1000 Steven SitElizabeth Woods Oates, MD      . lisinopril (PRINIVIL,ZESTRIL) tablet 20 mg  20 mg Oral Daily Steven SitElizabeth Woods Oates, MD   20 mg at  03/25/16 0856  . LORazepam (ATIVAN) tablet 1 mg  1 mg Oral Q6H PRN Steven SitElizabeth Woods Oates, MD   1 mg at 03/25/16 1602  . magnesium hydroxide (MILK OF MAGNESIA) suspension 30 mL  30 mL Oral Daily PRN Steven MansFran E Hobson, NP      . metFORMIN (GLUCOPHAGE) tablet 500 mg  500 mg Oral Q breakfast Steven SitElizabeth Woods Oates, MD   500 mg at 03/25/16 0856  . nicotine (NICODERM CQ - dosed in mg/24 hours) patch 21 mg  21 mg Transdermal Daily Steven SitElizabeth Woods Oates, MD   21 mg at 03/25/16 0856  . ondansetron (ZOFRAN) tablet 8 mg  8 mg Oral Q8H PRN Steven SitElizabeth Woods Oates, MD   8 mg at 03/25/16 0901  . traZODone (DESYREL) tablet 50 mg  50 mg Oral QHS PRN Steven SitElizabeth Woods Oates, MD   50 mg at 03/24/16 2109    Lab Results: No results found for  this or any previous visit (from the past 48 hour(s)).  Blood Alcohol level:  Lab Results  Component Value Date   ETH <5 03/23/2016   ETH 6 (H) 03/19/2016    Metabolic Disorder Labs: Lab Results  Component Value Date   HGBA1C 6.5 (H) 04/06/2015   MPG 140 04/06/2015   MPG 97 04/27/2013   Lab Results  Component Value Date   PROLACTIN 18.2 (H) 04/06/2015   Lab Results  Component Value Date   CHOL 284 (H) 04/06/2015   TRIG 378 (H) 04/06/2015   HDL 43 04/06/2015   CHOLHDL 6.6 04/06/2015   VLDL 76 (H) 04/06/2015   LDLCALC 165 (H) 04/06/2015   LDLCALC 114 (H) 02/10/2015    Physical Findings: AIMS: Facial and Oral Movements Muscles of Facial Expression: None, normal Lips and Perioral Area: None, normal Jaw: None, normal Tongue: None, normal,Extremity Movements Upper (arms, wrists, hands, fingers): None, normal Lower (legs, knees, ankles, toes): None, normal, Trunk Movements Neck, shoulders, hips: None, normal, Overall Severity Severity of abnormal movements (highest score from questions above): None, normal Incapacitation due to abnormal movements: None, normal Patient's awareness of abnormal movements (rate only patient's report): No Awareness, Dental  Status Current problems with teeth and/or dentures?: No Does patient usually wear dentures?: No  CIWA:  CIWA-Ar Total: 12 COWS:     Musculoskeletal: Strength & Muscle Tone: within normal limits Gait & Station: normal Patient leans: N/A  Psychiatric Specialty Exam: Physical Exam  Nursing note and vitals reviewed. Constitutional: He is oriented to person, place, and time.  Neurological: He is alert and oriented to person, place, and time.  Psychiatric: He has a normal mood and affect. His behavior is normal.    Review of Systems  Psychiatric/Behavioral: Positive for depression, substance abuse and suicidal ideas. The patient is nervous/anxious and has insomnia.     Blood pressure 113/80, pulse 96, temperature 99.2 F (37.3 C), resp. rate 16, height 5' 7.5" (1.715 m), weight 99.3 kg (219 lb).Body mass index is 33.79 kg/m.  General Appearance: Casual  Eye Contact:  Minimal  Speech:  Clear and Coherent  Volume:  Normal  Mood:  Depressed  Affect:  Appropriate  Thought Process:  Coherent  Orientation:  Full (Time, Place, and Person)  Thought Content:  Hallucinations: None  Suicidal Thoughts:  No  Homicidal Thoughts:  No  Memory:  Immediate;   Fair Recent;   Fair Remote;   Fair  Judgement:  Fair  Insight:  Fair  Psychomotor Activity:  Normal  Concentration:  Concentration: Fair  Recall:  [employer] of Knowledge:  Fair  Language:  Fair  Akathisia:  Yes  Handed:  Right  AIMS (if indicated):     Assets:  Communication Skills Desire for Improvement Physical Health  ADL's:  Intact  Cognition:  WNL  Sleep:  Number of Hours: 5.5     I agree with current treatment plan on 03/25/2016, Patient seen face-to-face for psychiatric evaluation follow-up, chart reviewed. Reviewed the information documented and agree with the treatment plan.  Treatment Plan Summary: Daily contact with patient to assess and evaluate symptoms and progress in treatment and Medication management    Continue with Abilify 5 mg and will increase  Prozac 20 mg to 40mg   for mood stabilization. Continue with Trazodone 50 mg for insomnia Started on CWIA/ Ativan Protocol- PRN Will continue to monitor vitals ,medication compliance and treatment side effects while patient is here.  CSW will start working on disposition.  Patient to participate  in therapeutic milieu  Oneta Rackanika N Lewis, NP 03/25/2016, 4:03 PM

## 2016-03-25 NOTE — [facility] Group Notes (Signed)
Patient did not attend Psychosocial Group led by RN. 

## 2016-03-25 NOTE — Progress Notes (Signed)
Psychoeducational Group Note  Date:  03/25/2016 Time: 2100  Group Topic/Focus:  wrap upgroup  Participation Level: Did Not Attend  Participation Quality:  Not Applicable  Affect:  Not Applicable  Cognitive:  Not Applicable  Insight:  Not Applicable  Engagement in Group: Not Applicable  Additional Comments:  Pt was notified that group was beginning but remained in bed.   Marcille BuffyMcNeil, Hershall Benkert S 03/25/2016, 10:00 PM

## 2016-03-25 NOTE — Progress Notes (Signed)
Patient is complaining of increased physical discomfort - sweats and chills, feeling very shaky inside, severe nausea without vomiting, and back and left testicle pain. Patient states that he has had about 20 kidney stones in the past and these pain symptoms feel similar. VS T 99.7 P 106 BP sitting 102/64. Discussed with NP and phenergan ordered for nausea. Patient received tylenol prior to temp being taken along with Ativan. Will continue to monitor - per patient, this is about the time that his ETOH withdrawal symptoms peak.

## 2016-03-26 MED ORDER — FAMOTIDINE 20 MG PO TABS
40.0000 mg | ORAL_TABLET | Freq: Once | ORAL | Status: AC
  Administered 2016-03-26: 40 mg via ORAL
  Filled 2016-03-26: qty 2

## 2016-03-26 MED ORDER — FAMOTIDINE 20 MG PO TABS
ORAL_TABLET | ORAL | Status: AC
  Filled 2016-03-26: qty 2

## 2016-03-26 MED ORDER — LORAZEPAM 1 MG PO TABS
1.0000 mg | ORAL_TABLET | Freq: Once | ORAL | Status: AC
  Administered 2016-03-26: 1 mg via ORAL
  Filled 2016-03-26: qty 1

## 2016-03-26 MED ORDER — PANTOPRAZOLE SODIUM 40 MG PO TBEC
40.0000 mg | DELAYED_RELEASE_TABLET | Freq: Every day | ORAL | Status: DC
  Administered 2016-03-26 – 2016-03-27 (×2): 40 mg via ORAL
  Filled 2016-03-26 (×4): qty 1

## 2016-03-26 NOTE — Progress Notes (Signed)
[facility] MD Progress Note  03/26/2016 9:41 AM Steven [name]  MRN:  [id number] Subjective:  Patient reports " I need to finding a different AA meeting." States having a good day because his father is coming to pick him up. Patient reports he feels ready for discharge.   Objectives: Steven [name] is awake, alert and oriented X4 seen resting in bedroom. Denies suicidal or homicidal ideation. Pt states passive thoughts of suicidal ideations. Denies auditory or visual hallucination and does not appear to be responding to internal stimuli. Patient reports he is medication compliant without mediation side effects. Patient appears to be in good sprits. Patient is requesting to be discharged today, so that he can [place]. Patient report he has a sponsor to help with his discharge treatment.States his depression 2/10. Patient reports " I thought I was in observation and I can leave when I get ready." Reports good appetite and reports resting well. Support, encouragement and reassurance was provided.   Principal Problem: MDD (major depressive disorder), recurrent severe, without psychosis (HCC) Diagnosis:   Patient Active Problem List   Diagnosis Date Noted  . Polysubstance abuse [F19.10] 02/23/2016  . Opiate dependence (HCC) [F11.20] 02/23/2016  . 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 MDD (major depressive disorder), recurrent severe, without psychosis (HCC) [F33.2] 06/18/2015  . Substance induced mood disorder (HCC) [F19.94] 03/08/2015  . Suicidal ideation [R45.851]   . COPD (chronic obstructive pulmonary disease) (HCC) [J44.9] 02/09/2015  . Tobacco use disorder [F17.200] 02/09/2015  . Stimulant use disorder (cocaine) [F15.90] 02/09/2015  . Alcohol use disorder, severe, in sustained remission (HCC) [F10.21] 02/09/2015  . Ureteral stone [N20.1] 12/31/2014  . 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 CAD s/p CABG  2012 [facility] [I25.810] 11/28/2011  . Hyperlipidemia LDL goal <100 [E78.5] 03/13/2007  . Essential hypertension [I10] 03/13/2007  . GERD [K21.9] 03/13/2007   Total Time spent with patient: 30 minutes  Past Psychiatric History:   Past Medical History:  Past Medical History:  Diagnosis Date  . Asthma   . Bipolar disorder (manic depression) (HCC)   . COPD (chronic obstructive pulmonary disease) (HCC)   . Coronary artery disease   . Hx of CABG   . Hypercholesteremia   . Hypertension   . Kidney stone   . Tobacco use disorder     Past Surgical History:  Procedure Laterality Date  . ABDOMINAL ANGIOGRAM  08/15/2012   Procedure: ABDOMINAL ANGIOGRAM;  Surgeon: Corky Crafts, MD;  Location: [facility] CATH LAB;  Service: Cardiovascular;;  . APPENDECTOMY    . CARDIOVERSION N/A 08/28/2012   Procedure: Pseudo Compression;  Surgeon: Chuck Hint, MD;  Location: [facility] CATH LAB;  Service: Cardiovascular;  Laterality: N/A;  . CORONARY ARTERY BYPASS GRAFT    . CYSTOSCOPY WITH RETROGRADE PYELOGRAM, URETEROSCOPY AND STENT PLACEMENT Right 12/31/2014   Procedure: CYSTOSCOPY  RIGHT URETEROSCOPY WITH STONE RETREIVAL RIGHT RETROGRADE PYELOGRAM;  Surgeon: Malen Gauze, MD;  Location: [facility] ORS;  Service: Urology;  Laterality: Right;  . KIDNEY STONE SURGERY    . LOWER EXTREMITY ANGIOGRAM N/A 08/15/2012   Procedure: LOWER EXTREMITY ANGIOGRAM with possible PTA /stent;  Surgeon: Corky Crafts, MD;  Location: [facility] CATH LAB;  Service: Cardiovascular;  Laterality: N/A;   Family History:  Family History  Problem Relation Age of Onset  . Heart disease Father   . Hyperlipidemia Father   . Heart disease Maternal Grandmother  Family Psychiatric  History:  Social History:  History  Alcohol Use  . Yes     History  Drug Use  . Types: Cocaine, Marijuana    Comment: 10/23//2016    Social History   Social History  . Marital status: Divorced    Spouse name: N/A  . Number of children:  N/A  . Years of education: N/A   Social History Main Topics  . Smoking status: Current Every Day Smoker    Packs/day: 1.00    Years: 31.00    Types: Cigarettes  . Smokeless tobacco: Former NeurosurgeonUser    Quit date: 04/27/2013  . Alcohol use Yes  . Drug use:     Types: Cocaine, Marijuana     Comment: 10/23//2016  . Sexual activity: Yes    Birth control/ protection: None   Other Topics Concern  . None   Social History Narrative  . None   Additional Social History:    Pain Medications: denies Prescriptions: denies Over the Counter: denies History of alcohol / drug use?: Yes Longest period of sobriety (when/how long): 18 months from 2015-2016 Negative Consequences of Use: Financial, Legal, Personal relationships Withdrawal Symptoms: Tremors, Nausea / Vomiting, Sweats Name of Substance 1: Alcohol 1 - Age of First Use: Teenager 1 - Amount (size/oz): 12-24 beers 1 - Frequency: daily 1 - Duration: on-going 1 - Last Use / Amount: yesterday/12 pack Name of Substance 2: Cocaine 2 - Age of First Use: in his 6720s 2 - Amount (size/oz): varies 2 - Frequency: twice weekly 2 - Duration: ongoing 2 - Last Use / Amount: day before yesterday                Sleep: Good  Appetite:  Fair  Current Medications: Current Facility-Administered Medications  Medication Dose Route Frequency Provider Last Rate Last Dose  . famotidine (PEPCID) 20 MG tablet           . acetaminophen (TYLENOL) tablet 650 mg  650 mg Oral Q6H PRN Kristeen MansFran E Hobson, NP   650 mg at 03/25/16 1602  . albuterol (PROVENTIL HFA;VENTOLIN HFA) 108 (90 Base) MCG/ACT inhaler 2 puff  2 puff Inhalation Q6H PRN Acquanetta SitElizabeth Woods Oates, MD   2 puff at 03/25/16 1607  . alum & mag hydroxide-simeth (MAALOX/MYLANTA) 200-200-20 MG/5ML suspension 30 mL  30 mL Oral Q4H PRN Kristeen MansFran E Hobson, NP   30 mL at 03/25/16 1752  . ARIPiprazole (ABILIFY) tablet 5 mg  5 mg Oral Daily Acquanetta SitElizabeth Woods Oates, MD   5 mg at 03/26/16 0930  . FLUoxetine (PROZAC)  capsule 40 mg  40 mg Oral Daily Oneta Rackanika N Lewis, NP   40 mg at 03/26/16 0930  . hydrOXYzine (ATARAX/VISTARIL) tablet 50 mg  50 mg Oral TID PRN Acquanetta SitElizabeth Woods Oates, MD   50 mg at 03/26/16 0421  . Influenza vac split quadrivalent PF (FLUARIX) injection 0.5 mL  0.5 mL Intramuscular Tomorrow-1000 Acquanetta SitElizabeth Woods Oates, MD      . lisinopril (PRINIVIL,ZESTRIL) tablet 20 mg  20 mg Oral Daily Acquanetta SitElizabeth Woods Oates, MD   20 mg at 03/26/16 0930  . magnesium hydroxide (MILK OF MAGNESIA) suspension 30 mL  30 mL Oral Daily PRN Kristeen MansFran E Hobson, NP      . metFORMIN (GLUCOPHAGE) tablet 500 mg  500 mg Oral Q breakfast Acquanetta SitElizabeth Woods Oates, MD   500 mg at 03/26/16 0930  . nicotine (NICODERM CQ - dosed in mg/24 hours) patch 21 mg  21 mg Transdermal Daily Verlee MonteElizabeth Woods Rio OsoOates,  MD   21 mg at 03/26/16 0931  . ondansetron (ZOFRAN) tablet 8 mg  8 mg Oral Q8H PRN Acquanetta Sit, MD   8 mg at 03/25/16 1630  . pantoprazole (PROTONIX) EC tablet 40 mg  40 mg Oral Daily Jackelyn Poling, NP   40 mg at 03/26/16 0930  . traZODone (DESYREL) tablet 50 mg  50 mg Oral QHS PRN Acquanetta Sit, MD   50 mg at 03/25/16 2123    Lab Results: No results found for this or any previous visit (from the past 48 hour(s)).  Blood Alcohol level:  Lab Results  Component Value Date   ETH <5 03/23/2016   ETH 6 (H) 03/19/2016    Metabolic Disorder Labs: Lab Results  Component Value Date   HGBA1C 6.5 (H) 04/06/2015   MPG 140 04/06/2015   MPG 97 04/27/2013   Lab Results  Component Value Date   PROLACTIN 18.2 (H) 04/06/2015   Lab Results  Component Value Date   CHOL 284 (H) 04/06/2015   TRIG 378 (H) 04/06/2015   HDL 43 04/06/2015   CHOLHDL 6.6 04/06/2015   VLDL 76 (H) 04/06/2015   LDLCALC 165 (H) 04/06/2015   LDLCALC 114 (H) 02/10/2015    Physical Findings: AIMS: Facial and Oral Movements Muscles of Facial Expression: None, normal Lips and Perioral Area: None, normal Jaw: None, normal Tongue: None,  normal,Extremity Movements Upper (arms, wrists, hands, fingers): None, normal Lower (legs, knees, ankles, toes): None, normal, Trunk Movements Neck, shoulders, hips: None, normal, Overall Severity Severity of abnormal movements (highest score from questions above): None, normal Incapacitation due to abnormal movements: None, normal Patient's awareness of abnormal movements (rate only patient's report): No Awareness, Dental Status Current problems with teeth and/or dentures?: No Does patient usually wear dentures?: No  CIWA:  CIWA-Ar Total: 9 COWS:     Musculoskeletal: Strength & Muscle Tone: within normal limits Gait & Station: normal Patient leans: N/A  Psychiatric Specialty Exam: Physical Exam  Nursing note and vitals reviewed. Constitutional: He is oriented to person, place, and time. He appears well-developed and well-nourished.  Neurological: He is alert and oriented to person, place, and time.  Skin: Skin is warm and dry.  Psychiatric: He has a normal mood and affect. His behavior is normal.    Review of Systems  Psychiatric/Behavioral: Positive for depression and substance abuse. The patient is nervous/anxious and has insomnia.     Blood pressure 97/60, pulse 92, temperature 98 F (36.7 C), temperature source Oral, resp. rate 19, height 5' 7.5" (1.715 m), weight 99.3 kg (219 lb).Body mass index is 33.79 kg/m.  General Appearance: Casual  Eye Contact:  Minimal  Speech:  Clear and Coherent  Volume:  Normal  Mood:  Depressed  Affect:  Appropriate  Thought Process:  Coherent  Orientation:  Full (Time, Place, and Person)  Thought Content:  Hallucinations: None  Suicidal Thoughts:  No  Homicidal Thoughts:  No  Memory:  Immediate;   Fair Recent;   Fair Remote;   Fair  Judgement:  Fair  Insight:  Fair  Psychomotor Activity:  Normal  Concentration:  Concentration: Fair  Recall:  [employer] of Knowledge:  Fair  Language:  Fair  Akathisia:  Yes  Handed:  Right  AIMS  (if indicated):     Assets:  Communication Skills Desire for Improvement Physical Health  ADL's:  Intact  Cognition:  WNL  Sleep:  Number of Hours: 4.75     I agree with current  treatment plan on 03/26/2016, Patient seen face-to-face for psychiatric evaluation follow-up, chart reviewed. Reviewed the information documented and agree with the treatment plan.  Treatment Plan Summary: Daily contact with patient to assess and evaluate symptoms and progress in treatment and Medication management   Continue with Abilify 5 mg for depression/mood stabilization Continue Prozac 40mg   for mood stabilization. Continue with Trazodone 50 mg for insomnia Started on CWIA/ Ativan Protocol- PRN Will continue to monitor vitals ,medication compliance and treatment side effects while patient is here.  CSW will start working on disposition.  Patient to participate in therapeutic milieu  Oneta Rackanika N Lewis, NP 03/26/2016, 9:41 AM

## 2016-03-26 NOTE — Progress Notes (Signed)
D   Pt spends a lot of time at the nurses station requesting medications    He doesn't go to groups and has limited interaction with staff and peers    He said this was the worst detox he has ever gone through    He said he just wants something to help his anxiety and help him to sleep A    Verbal support given    Medications administered and effectiveness monitored   Discussed pt request with NP and received orders    Q 15 min checks R   Pt is safe at present time

## 2016-03-26 NOTE — Progress Notes (Signed)
D    Pt isolated to his room and was not medication seeking this evening  He has limited socialization with others    He said he is ready for discharge and hopes to be out of the hospital by lunchtime tomorrow   He said his Dad is in town and he wants to spend time with him   No complaints of stomach problems  A    Verbal support given   Medications administered and effectiveness monitored   Q 15 min checks R   He remains safe

## 2016-03-26 NOTE — Progress Notes (Signed)
D: Patient's self inventory sheet: patient has good sleep, with sleep medication.good  Appetite, normal energy level, good concentration. Rated depression 3/10, hopeless 3/10, anxiety 7/10. SI/HI/AVH: Currently denies all. Physical complaints are denied. Goal is "I want to leave. Dad coming from [place]. Would like to leave if possible". Plans to work on "talk with nurses and doctor". Patient reports a remarkable improvement today in all areas. Denies all physical symptoms and also denies the depression and SI he has reported since admission. Stated that he would like to discharge today.   A: Medications administered, assessed medication knowledge and education given on medication regimen.  Emotional support and encouragement given patient. R: Denies SI and HI , contracts for safety. Safety maintained with 15 minute checks.

## 2016-03-26 NOTE — Progress Notes (Signed)
Psychoeducational Group Note  Date:  03/26/2016 Time:  2100 Group Topic/Focus:  wrap up group  Participation Level: Did Not Attend  Participation Quality:  Not Applicable  Affect:  Not Applicable  Cognitive:  Not Applicable  Insight:  Not Applicable  Engagement in Group: Not Applicable  Additional Comments:  Pt was notified that group was beginning but remained in bed.   Marcille BuffyMcNeil, Nabilah Davoli S 03/26/2016, 10:40 PM

## 2016-03-26 NOTE — BHH Group Notes (Signed)
Adult Therapy Group Note  Date: 03/26/2016  Time:  10:00-11:00AM  Group Topic/Focus: Healthy Support Systems   Building Self Esteem:   The focus of this group was to assist patients in identifying their current healthy supports as well as to list and discuss other supports that can be put in place to help with achieving life goals.  These items included supports such as 12-step groups, individual therapy, psychiatrists, children, faith activities, accountability partner, sponsor, group therapy, support groups, classes on mental health, phone apps/YouTube, gym/exercise, and more.  A song was  played at the end of group with a short discussion about using music as an additional support/coping mechanism.    Participation Level:  Active  Participation Quality:  Attentive and Sharing  Affect:  Appropriate  Cognitive:  Appropriate  Insight: Good  Engagement in Group:  Engaged  Modes of Intervention:  Activity, Discussion and Support  Additional Comments:  The patient expressed that a current health support is his father, and his cousin is unhealthy.  He is agitating today for discharge.  Lynnell ChadMareida J Grossman-Orr, LCSW 03/26/2016  11:20 AM

## 2016-03-27 MED ORDER — PANTOPRAZOLE SODIUM 40 MG PO TBEC: 40.0000 mg | DELAYED_RELEASE_TABLET | Freq: Every day | ORAL | 0 refills | Status: DC

## 2016-03-27 MED ORDER — TRAZODONE HCL 50 MG PO TABS: 50.0000 mg | ORAL_TABLET | Freq: Every evening | ORAL | 0 refills | Status: DC | PRN

## 2016-03-27 MED ORDER — LISINOPRIL 20 MG PO TABS: 20.0000 mg | ORAL_TABLET | Freq: Every day | ORAL | 0 refills | Status: DC

## 2016-03-27 MED ORDER — NICOTINE 21 MG/24HR TD PT24: 21.0000 mg | MEDICATED_PATCH | Freq: Every day | TRANSDERMAL | 0 refills | Status: DC

## 2016-03-27 MED ORDER — ARIPIPRAZOLE 5 MG PO TABS: 5.0000 mg | ORAL_TABLET | Freq: Every day | ORAL | 0 refills | Status: DC

## 2016-03-27 MED ORDER — HYDROXYZINE HCL 50 MG PO TABS: 50.0000 mg | ORAL_TABLET | Freq: Three times a day (TID) | ORAL | 0 refills | Status: DC | PRN

## 2016-03-27 MED ORDER — FLUOXETINE HCL 40 MG PO CAPS: 40.0000 mg | ORAL_CAPSULE | Freq: Every day | ORAL | 0 refills | Status: DC

## 2016-03-27 MED ORDER — METFORMIN HCL 500 MG PO TABS: 500.0000 mg | ORAL_TABLET | Freq: Every day | ORAL | 0 refills | Status: DC

## 2016-03-27 NOTE — Progress Notes (Signed)
Recreation Therapy Notes  Date: 03/27/16 Time: 0930 Location: 300 Hall Dayroom  Group Topic: Stress Management  Goal Area(s) Addresses:  Patient will verbalize importance of using healthy stress management.  Patient will identify positive emotions associated with healthy stress management.   Behavioral Response: Engaged  Intervention: Stress Management  Activity :  Peaceful [place]aves.  LRT introduced to the stress management technique of guided imagery.  LRT read a script to engage patients in the activity.  Patients were to follow along as LRT read script to participate in activity.  Education:  Stress Management, Discharge Planning.   Education Outcome: Acknowledges edcuation/In group clarification offered/Needs additional education  Clinical Observations/Feedback: Pt attended group.     Caroll RancherMarjette Marji Kuehnel, LRT/CTRS      Caroll RancherLindsay, Aveena Bari A 03/27/2016 12:16 PM

## 2016-03-27 NOTE — Tx Team (Signed)
Interdisciplinary Treatment and Diagnostic Plan Update  03/27/2016 Time of Session: 9:30AM Steven [name] MRN: [id number]  Principal Diagnosis: MDD (major depressive disorder), recurrent severe, without psychosis ([place])  Secondary Diagnoses: Principal Problem:   MDD (major depressive disorder), recurrent severe, without psychosis ([place])   Current Medications:  Current Facility-Administered Medications  Medication Dose Route Frequency Provider Last Rate Last Dose  . acetaminophen (TYLENOL) tablet 650 mg  650 mg Oral Q6H PRN Lurena Nida, NP   650 mg at 03/26/16 1300  . albuterol (PROVENTIL HFA;VENTOLIN HFA) 108 (90 Base) MCG/ACT inhaler 2 puff  2 puff Inhalation Q6H PRN Linard Millers, MD   2 puff at 03/25/16 1607  . alum & mag hydroxide-simeth (MAALOX/MYLANTA) 200-200-20 MG/5ML suspension 30 mL  30 mL Oral Q4H PRN Lurena Nida, NP   30 mL at 03/25/16 1752  . ARIPiprazole (ABILIFY) tablet 5 mg  5 mg Oral Daily Linard Millers, MD   5 mg at 03/27/16 0840  . FLUoxetine (PROZAC) capsule 40 mg  40 mg Oral Daily Derrill Center, NP   40 mg at 03/27/16 0840  . hydrOXYzine (ATARAX/VISTARIL) tablet 50 mg  50 mg Oral TID PRN Linard Millers, MD   50 mg at 03/27/16 0844  . Influenza vac split quadrivalent PF (FLUARIX) injection 0.5 mL  0.5 mL Intramuscular Tomorrow-1000 Linard Millers, MD      . lisinopril (PRINIVIL,ZESTRIL) tablet 20 mg  20 mg Oral Daily Linard Millers, MD   20 mg at 03/27/16 0840  . magnesium hydroxide (MILK OF MAGNESIA) suspension 30 mL  30 mL Oral Daily PRN Lurena Nida, NP      . metFORMIN (GLUCOPHAGE) tablet 500 mg  500 mg Oral Q breakfast Linard Millers, MD   500 mg at 03/27/16 0840  . nicotine (NICODERM CQ - dosed in mg/24 hours) patch 21 mg  21 mg Transdermal Daily Linard Millers, MD   21 mg at 03/27/16 0841  . ondansetron (ZOFRAN) tablet 8 mg  8 mg Oral Q8H PRN Linard Millers, MD   8 mg at 03/26/16 1503  . pantoprazole  (PROTONIX) EC tablet 40 mg  40 mg Oral Daily Rozetta Nunnery, NP   40 mg at 03/27/16 0840  . traZODone (DESYREL) tablet 50 mg  50 mg Oral QHS PRN Linard Millers, MD   50 mg at 03/26/16 2130   PTA Medications: Prescriptions Prior to Admission  Medication Sig Dispense Refill Last Dose  . ARIPiprazole (ABILIFY) 5 MG tablet Take 1 tablet (5 mg total) by mouth daily. 14 tablet  unknown  . busPIRone (BUSPAR) 5 MG tablet Take 1 tablet (5 mg total) by mouth 2 (two) times daily. 14 tablet 0 unknown  . FLUoxetine (PROZAC) 20 MG capsule Take 1 capsule (20 mg total) by mouth daily. 14 capsule 0 unknown  . hydrOXYzine (ATARAX/VISTARIL) 50 MG tablet Take 1 tablet (50 mg total) by mouth every 6 (six) hours as needed for anxiety. 14 tablet 0 unknown  . nicotine (NICODERM CQ - DOSED IN MG/24 HOURS) 21 mg/24hr patch Place 1 patch (21 mg total) onto the skin daily. 28 patch 0 unknown  . traZODone (DESYREL) 100 MG tablet Take 1 tablet (100 mg total) by mouth at bedtime. 14 tablet 0 unknown    Patient Stressors: Financial difficulties Health problems Substance abuse  Patient Strengths: Active sense of humor Capable of independent living  Treatment Modalities: Medication Management, Group therapy, Case management,  1 to  1 session with clinician, Psychoeducation, Recreational therapy.   Physician Treatment Plan for Primary Diagnosis: MDD (major depressive disorder), recurrent severe, without psychosis ([place]) Long Term Goal(s): Improvement in symptoms so as ready for discharge Improvement in symptoms so as ready for discharge   Short Term Goals: Ability to demonstrate self-control will improve Compliance with prescribed medications will improve Ability to identify changes in lifestyle to reduce recurrence of condition will improve Ability to identify and develop effective coping behaviors will improve Ability to identify triggers associated with substance abuse/mental health issues will  improve  Medication Management: Evaluate patient's response, side effects, and tolerance of medication regimen.  Therapeutic Interventions: 1 to 1 sessions, Unit Group sessions and Medication administration.  Evaluation of Outcomes: Met  Physician Treatment Plan for Secondary Diagnosis: Principal Problem:   MDD (major depressive disorder), recurrent severe, without psychosis ([place])  Long Term Goal(s): Improvement in symptoms so as ready for discharge Improvement in symptoms so as ready for discharge   Short Term Goals: Ability to demonstrate self-control will improve Compliance with prescribed medications will improve Ability to identify changes in lifestyle to reduce recurrence of condition will improve Ability to identify and develop effective coping behaviors will improve Ability to identify triggers associated with substance abuse/mental health issues will improve     Medication Management: Evaluate patient's response, side effects, and tolerance of medication regimen.  Therapeutic Interventions: 1 to 1 sessions, Unit Group sessions and Medication administration.  Evaluation of Outcomes:  Met   RN Treatment Plan for Primary Diagnosis: MDD (major depressive disorder), recurrent severe, without psychosis ([place]) Long Term Goal(s): Knowledge of disease and therapeutic regimen to maintain health will improve  Short Term Goals: Ability to remain free from injury will improve, Ability to disclose and discuss suicidal ideas and Ability to identify and develop effective coping behaviors will improve  Medication Management: RN will administer medications as ordered by provider, will assess and evaluate patient's response and provide education to patient for prescribed medication. RN will report any adverse and/or side effects to prescribing provider.  Therapeutic Interventions: 1 on 1 counseling sessions, Psychoeducation, Medication administration, Evaluate responses to treatment, Monitor  vital signs and CBGs as ordered, Perform/monitor CIWA, COWS, AIMS and Fall Risk screenings as ordered, Perform wound care treatments as ordered.  Evaluation of Outcomes: Met   LCSW Treatment Plan for Primary Diagnosis: MDD (major depressive disorder), recurrent severe, without psychosis ([place]) Long Term Goal(s): Safe transition to appropriate next level of care at discharge, Engage patient in therapeutic group addressing interpersonal concerns.  Short Term Goals: Engage patient in aftercare planning with referrals and resources, Facilitate patient progression through stages of change regarding substance use diagnoses and concerns and Identify triggers associated with mental health/substance abuse issues  Therapeutic Interventions: Assess for all discharge needs, 1 to 1 time with Social worker, Explore available resources and support systems, Assess for adequacy in community support network, Educate family and significant other(s) on suicide prevention, Complete Psychosocial Assessment, Interpersonal group therapy.  Evaluation of Outcomes: Met   Progress in Treatment: Attending groups: No.  Participating in groups: No. Taking medication as prescribed: Yes. Toleration medication: Yes. Family/Significant other contact made: SPE completed with pt; pt declined to consent to family contact.  Patient understands diagnosis: Yes. Discussing patient identified problems/goals with staff: Yes. Medical problems stabilized or resolved: Yes. Denies suicidal/homicidal ideation: Yes, self report.  Issues/concerns per patient self-inventory: No. Other: n/a  New problem(s) identified: No, Describe:  n/a  New Short Term/Long Term Goal(s): medication stabilization, detox,  development of effective aftercare plan.   Discharge Plan or Barriers:  Pt plans to d/c home with his father. [facility] [place] [facility]. Pt given AA/NA information and Mental health association.   Reason for Continuation of  Hospitalization: none  Estimated Length of Stay: d/c today  Attendees: Patient: 03/27/2016 9:30 AM  Physician: Dr. Odelia Gage MD 03/27/2016 9:30 AM  Nursing: Williemae Natter RN 03/27/2016 9:30 AM  RN Care Manager: Lars Pinks CM 03/27/2016 9:30 AM  Social Worker: Maxie Better, LCSW 03/27/2016 9:30 AM  Recreational Therapist:  03/27/2016 9:30 AM  Other:Contrad Withrow NP 03/27/2016 9:30 AM  Other:  03/27/2016 9:30 AM  Other: 03/27/2016 9:30 AM    Scribe for Treatment Team: [place], LCSW 03/27/2016 9:30 AM

## 2016-03-27 NOTE — Progress Notes (Signed)
  [facility] :  Will you be returning to the same living situation after discharge:  Yes,  motel or with his father At discharge, do you have transportation home?: Yes,  dad Do you have the ability to pay for your medications: Yes,  mental health  Release of information consent forms completed and submitted to medical records by CSW.  Patient to Follow up at: Follow-up Information    [facility] Follow up.   Specialty:  Behavioral Health Why:  Walk in between 8am-9am Monday through Friday for hospital follow-up/medication management/assessment for counseling services.  Contact information: [address] [place] [address] [phone]        [facility] Follow up.   Why:  Please call Tuesday morning at 9;30AM to get appt scheduled. No appts are available at this time. Thank you.  Contact information: [address] [place], [place] [address] Phone: [phone] Fax: [phone]          Next level of care provider has [facility] to [facility] Link:no  Safety Planning and Suicide Prevention discussed: Yes,  SPE completed with pt; pt declined to consent to family contact.  Have you used any form of tobacco in the last 30 days? (Cigarettes, Smokeless Tobacco, Cigars, and/or Pipes): Yes  Has patient been referred to the Quitline?: Patient refused referral  Patient has been referred for addiction treatment: Yes  Jeury Mcnab N Smart LCSW 03/27/2016, 9:27 AM
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
## 2016-03-27 NOTE — Progress Notes (Signed)
Discharge note:  Patient discharged home per MD order.  Patient received all personal belongings from locker and unit.  [facility].  He denies any thoughts of self harm; AVH/HI.  Patient received prescriptions of his medications.  Reviewed AVS/transition record with patient and he indicated understanding.  Patient left ambulatory with his father.

## 2016-04-17 ENCOUNTER — Encounter (HOSPITAL_COMMUNITY): Payer: Self-pay | Admitting: *Deleted

## 2016-04-17 ENCOUNTER — Emergency Department (HOSPITAL_COMMUNITY)
Admission: EM | Admit: 2016-04-17 | Discharge: 2016-04-17 | Disposition: A | Discharge status: Other Acute Inpatient Hospital | Payer: Self-pay | Attending: Emergency Medicine | Admitting: Emergency Medicine

## 2016-04-17 DIAGNOSIS — F1010 Alcohol abuse, uncomplicated: Secondary | ICD-10-CM | POA: Insufficient documentation

## 2016-04-17 DIAGNOSIS — J449 Chronic obstructive pulmonary disease, unspecified: Secondary | ICD-10-CM | POA: Insufficient documentation

## 2016-04-17 DIAGNOSIS — F17210 Nicotine dependence, cigarettes, uncomplicated: Secondary | ICD-10-CM | POA: Insufficient documentation

## 2016-04-17 DIAGNOSIS — Z79899 Other long term (current) drug therapy: Secondary | ICD-10-CM | POA: Insufficient documentation

## 2016-04-17 DIAGNOSIS — Z7984 Long term (current) use of oral hypoglycemic drugs: Secondary | ICD-10-CM | POA: Insufficient documentation

## 2016-04-17 DIAGNOSIS — F332 Major depressive disorder, recurrent severe without psychotic features: Secondary | ICD-10-CM | POA: Insufficient documentation

## 2016-04-17 DIAGNOSIS — I10 Essential (primary) hypertension: Secondary | ICD-10-CM | POA: Insufficient documentation

## 2016-04-17 DIAGNOSIS — I2510 Atherosclerotic heart disease of native coronary artery without angina pectoris: Secondary | ICD-10-CM | POA: Insufficient documentation

## 2016-04-17 DIAGNOSIS — Z951 Presence of aortocoronary bypass graft: Secondary | ICD-10-CM | POA: Insufficient documentation

## 2016-04-17 DIAGNOSIS — R45851 Suicidal ideations: Secondary | ICD-10-CM

## 2016-04-17 LAB — SALICYLATE LEVEL

## 2016-04-17 LAB — CBC WITH DIFFERENTIAL/PLATELET
Basophils Absolute: 0.1 10*3/uL (ref 0.0–0.1)
Basophils Relative: 1 %
EOS ABS: 0.3 10*3/uL (ref 0.0–0.7)
EOS PCT: 5 %
HCT: 46.2 % (ref 39.0–52.0)
Hemoglobin: 16 g/dL (ref 13.0–17.0)
LYMPHS ABS: 1.2 10*3/uL (ref 0.7–4.0)
Lymphocytes Relative: 18 %
MCH: 32.8 pg (ref 26.0–34.0)
MCHC: 34.6 g/dL (ref 30.0–36.0)
MCV: 94.7 fL (ref 78.0–100.0)
MONOS PCT: 11 %
Monocytes Absolute: 0.7 10*3/uL (ref 0.1–1.0)
Neutro Abs: 4.1 10*3/uL (ref 1.7–7.7)
Neutrophils Relative %: 65 %
PLATELETS: 255 10*3/uL (ref 150–400)
RBC: 4.88 MIL/uL (ref 4.22–5.81)
RDW: 14.5 % (ref 11.5–15.5)
WBC: 6.3 10*3/uL (ref 4.0–10.5)

## 2016-04-17 LAB — URINALYSIS, ROUTINE W REFLEX MICROSCOPIC
BILIRUBIN URINE: NEGATIVE
GLUCOSE, UA: NEGATIVE mg/dL
HGB URINE DIPSTICK: NEGATIVE
KETONES UR: NEGATIVE mg/dL
Leukocytes, UA: NEGATIVE
Nitrite: NEGATIVE
PROTEIN: NEGATIVE mg/dL
Specific Gravity, Urine: 1.019 (ref 1.005–1.030)
pH: 6 (ref 5.0–8.0)

## 2016-04-17 LAB — COMPREHENSIVE METABOLIC PANEL
ALK PHOS: 75 U/L (ref 38–126)
ALT: 52 U/L (ref 17–63)
AST: 52 U/L — ABNORMAL HIGH (ref 15–41)
Albumin: 4 g/dL (ref 3.5–5.0)
Anion gap: 9 (ref 5–15)
BILIRUBIN TOTAL: 0.5 mg/dL (ref 0.3–1.2)
BUN: 14 mg/dL (ref 6–20)
CALCIUM: 9.4 mg/dL (ref 8.9–10.3)
CO2: 25 mmol/L (ref 22–32)
CREATININE: 0.98 mg/dL (ref 0.61–1.24)
Chloride: 103 mmol/L (ref 101–111)
GFR calc non Af Amer: >60 mL/min (ref >60)
GLUCOSE: 114 mg/dL — AB (ref 65–99)
Potassium: 3.6 mmol/L (ref 3.5–5.1)
SODIUM: 137 mmol/L (ref 135–145)
Total Protein: 6.7 g/dL (ref 6.5–8.1)

## 2016-04-17 LAB — RAPID URINE DRUG SCREEN, HOSP PERFORMED
Amphetamines: NOT DETECTED
Barbiturates: NOT DETECTED
Benzodiazepines: NOT DETECTED
Cocaine: POSITIVE — AB
OPIATES: NOT DETECTED
Tetrahydrocannabinol: POSITIVE — AB

## 2016-04-17 LAB — ACETAMINOPHEN LEVEL

## 2016-04-17 LAB — ETHANOL: Alcohol, Ethyl (B): <5 mg/dL (ref <5)

## 2016-04-17 MED ORDER — LISINOPRIL 20 MG PO TABS
20.0000 mg | ORAL_TABLET | Freq: Every day | ORAL | Status: DC
  Administered 2016-04-17: 20 mg via ORAL
  Filled 2016-04-17: qty 1

## 2016-04-17 MED ORDER — PANTOPRAZOLE SODIUM 40 MG PO TBEC
40.0000 mg | DELAYED_RELEASE_TABLET | Freq: Every day | ORAL | Status: DC
  Administered 2016-04-17: 40 mg via ORAL
  Filled 2016-04-17: qty 1

## 2016-04-17 MED ORDER — LORAZEPAM 1 MG PO TABS: 0.0000 mg | ORAL_TABLET | Freq: Two times a day (BID) | ORAL | Status: DC

## 2016-04-17 MED ORDER — HYDROXYZINE HCL 25 MG PO TABS: 50.0000 mg | ORAL_TABLET | Freq: Three times a day (TID) | ORAL | Status: DC | PRN

## 2016-04-17 MED ORDER — FLUOXETINE HCL 20 MG PO CAPS
40.0000 mg | ORAL_CAPSULE | Freq: Every day | ORAL | Status: DC
  Administered 2016-04-17: 40 mg via ORAL
  Filled 2016-04-17: qty 2

## 2016-04-17 MED ORDER — LORAZEPAM 1 MG PO TABS
0.0000 mg | ORAL_TABLET | Freq: Four times a day (QID) | ORAL | Status: DC
  Administered 2016-04-17: 1 mg via ORAL
  Filled 2016-04-17: qty 1

## 2016-04-17 MED ORDER — VITAMIN B-1 100 MG PO TABS
100.0000 mg | ORAL_TABLET | Freq: Every day | ORAL | Status: DC
  Administered 2016-04-17: 100 mg via ORAL
  Filled 2016-04-17: qty 1

## 2016-04-17 MED ORDER — METFORMIN HCL 500 MG PO TABS: 500.0000 mg | ORAL_TABLET | Freq: Every day | ORAL | Status: DC

## 2016-04-17 MED ORDER — IBUPROFEN 200 MG PO TABS
600.0000 mg | ORAL_TABLET | Freq: Three times a day (TID) | ORAL | Status: DC | PRN
Start: 2016-04-17 — End: 2016-04-18

## 2016-04-17 MED ORDER — NICOTINE 21 MG/24HR TD PT24
21.0000 mg | MEDICATED_PATCH | Freq: Every day | TRANSDERMAL | Status: DC
  Administered 2016-04-17: 21 mg via TRANSDERMAL
  Filled 2016-04-17: qty 1

## 2016-04-17 MED ORDER — THIAMINE HCL 100 MG/ML IJ SOLN: 100.0000 mg | Freq: Every day | INTRAMUSCULAR | Status: DC

## 2016-04-17 MED ORDER — TRAZODONE HCL 50 MG PO TABS: 50.0000 mg | ORAL_TABLET | Freq: Every evening | ORAL | Status: DC | PRN

## 2016-04-17 MED ORDER — ARIPIPRAZOLE 5 MG PO TABS
5.0000 mg | ORAL_TABLET | Freq: Every day | ORAL | Status: DC
  Administered 2016-04-17: 5 mg via ORAL
  Filled 2016-04-17: qty 1

## 2016-04-17 MED ORDER — ALUM & MAG HYDROXIDE-SIMETH 200-200-20 MG/5ML PO SUSP: 30.0000 mL | ORAL | Status: DC | PRN

## 2016-04-17 MED ORDER — ACETAMINOPHEN 325 MG PO TABS: 650.0000 mg | ORAL_TABLET | ORAL | Status: DC | PRN

## 2016-04-17 MED ORDER — ONDANSETRON HCL 4 MG PO TABS: 4.0000 mg | ORAL_TABLET | Freq: Three times a day (TID) | ORAL | Status: DC | PRN

## 2016-04-17 NOTE — ED Notes (Signed)
Patient is alert and  Oriented. Patient states he is wanting help coming off alcohol. Support and encouragement provided. Q 15 minute checks in progress and patient remains safe on unit.

## 2016-04-17 NOTE — BH Assessment (Signed)
[facility]HH Assessment Progress Note  Per Alberteen SamFran Hobson, [name], this pt [facility] at [facility].  Lillia AbedLindsay, RN, [facility] Obs [facility]; they will be ready to receive pt at 19:30.  Pt has signed Voluntary Admission and Consent for Treatment, as well as Consent to Release Information to no one, and signed forms have [facility].  Pt's nurse, Diane, has been notified, and agrees to send original paperwork along with pt via [facility], and to call report to [phone] or [phone].  Doylene Canninghomas Lagretta Loseke, MA Triage Specialist [phone]

## 2016-04-17 NOTE — ED Triage Notes (Signed)
Pt c/o having a alcoholic problem and needs help to quit.  Pt c/o of severe financial problems and sts he has thoughts of hurting himself.  Pt denies plan.  Pt denies HI.  Pt denies AH/VH.

## 2016-04-17 NOTE — ED Provider Notes (Signed)
[facility]-EMERGENCY DEPT Provider Note   CSN: [id number] Arrival date & time: 04/17/16  6295     History   Chief Complaint Chief Complaint  Patient presents with  . Medical Clearance    HPI Steven [name] is a 45 y.o. male.  HPI 45 year old male with complex past medical history as below here with suicidal ideation and alcohol abuse. Patient states that over the last month, his alcohol use has increased and he is now drinking one case of beer daily. He begins drinking a significant as he wakes up and drink throughout the day. Over the last month, he has felt increasingly depressed and endorses thoughts of hopelessness regarding his alcoholism as well as suicidal ideation. He has previously been seen and hospitalized for similar complaints. He states that over the last several days, he has had increasingly severe thoughts of suicidal ideation. He states that he drinks to improve these thoughts but they immediately return, as well as occasionally worsen while drinking. He has not attempted suicide but does admit that he has thought about slitting his wrists. Has a history of previous suicide attempt via slitting his left wrist in the past. Denies any fevers or chills. He has not been taking his medications regularly.  Past Medical History:  Diagnosis Date  . Asthma   . Bipolar disorder (manic depression) (HCC)   . COPD (chronic obstructive pulmonary disease) (HCC)   . Coronary artery disease   . Hx of CABG   . Hypercholesteremia   . Hypertension   . Kidney stone   . Tobacco use disorder     Patient Active Problem List   Diagnosis Date Noted  . Polysubstance abuse 02/23/2016  . Opiate dependence (HCC) 02/23/2016  . Cocaine abuse with cocaine-induced mood disorder (HCC) 02/21/2016  . MDD (major depressive disorder), recurrent severe, without psychosis (HCC) 06/18/2015  . Substance induced mood disorder (HCC) 03/08/2015  . Suicidal ideation   . COPD (chronic obstructive pulmonary  disease) (HCC) 02/09/2015  . Tobacco use disorder 02/09/2015  . Stimulant use disorder (cocaine) 02/09/2015  . Alcohol use disorder, severe, in sustained remission (HCC) 02/09/2015  . Ureteral stone 12/31/2014  . Claudication of left lower extremity (HCC) 08/14/2012  . CAD s/p CABG 2012 [facility] 11/28/2011  . Hyperlipidemia LDL goal <100 03/13/2007  . Essential hypertension 03/13/2007  . GERD 03/13/2007    Past Surgical History:  Procedure Laterality Date  . ABDOMINAL ANGIOGRAM  08/15/2012   Procedure: ABDOMINAL ANGIOGRAM;  Surgeon: Corky Crafts, MD;  Location: [facility] CATH LAB;  Service: Cardiovascular;;  . APPENDECTOMY    . CARDIOVERSION N/A 08/28/2012   Procedure: Pseudo Compression;  Surgeon: Chuck Hint, MD;  Location: [facility] CATH LAB;  Service: Cardiovascular;  Laterality: N/A;  . CORONARY ARTERY BYPASS GRAFT    . CYSTOSCOPY WITH RETROGRADE PYELOGRAM, URETEROSCOPY AND STENT PLACEMENT Right 12/31/2014   Procedure: CYSTOSCOPY  RIGHT URETEROSCOPY WITH STONE RETREIVAL RIGHT RETROGRADE PYELOGRAM;  Surgeon: Malen Gauze, MD;  Location: [facility] ORS;  Service: Urology;  Laterality: Right;  . KIDNEY STONE SURGERY    . LOWER EXTREMITY ANGIOGRAM N/A 08/15/2012   Procedure: LOWER EXTREMITY ANGIOGRAM with possible PTA /stent;  Surgeon: Corky Crafts, MD;  Location: [facility] CATH LAB;  Service: Cardiovascular;  Laterality: N/A;       Home Medications    Prior to Admission medications   Medication Sig Start Date End Date Taking? Authorizing Provider  ARIPiprazole (ABILIFY) 5 MG tablet Take 1 tablet (5 mg total) by  mouth daily. 03/28/16   Beau FannyJohn C Withrow, [name]  FLUoxetine (PROZAC) 40 MG capsule Take 1 capsule (40 mg total) by mouth daily. 03/28/16   Beau FannyJohn C Withrow, [name]  hydrOXYzine (ATARAX/VISTARIL) 50 MG tablet Take 1 tablet (50 mg total) by mouth 3 (three) times daily as needed for anxiety. 03/27/16   Beau FannyJohn C Withrow, [name]  lisinopril (PRINIVIL,ZESTRIL) 20 MG tablet Take 1  tablet (20 mg total) by mouth daily. 03/28/16   Beau FannyJohn C Withrow, [name]  metFORMIN (GLUCOPHAGE) 500 MG tablet Take 1 tablet (500 mg total) by mouth daily with breakfast. 03/28/16   Beau FannyJohn C Withrow, [name]  nicotine (NICODERM CQ - DOSED IN MG/24 HOURS) 21 mg/24hr patch Place 1 patch (21 mg total) onto the skin daily. 03/28/16   Beau FannyJohn C Withrow, [name]  pantoprazole (PROTONIX) 40 MG tablet Take 1 tablet (40 mg total) by mouth daily. 03/28/16   Beau FannyJohn C Withrow, [name]  traZODone (DESYREL) 50 MG tablet Take 1 tablet (50 mg total) by mouth at bedtime as needed for sleep. 03/27/16   Beau FannyJohn C Withrow, [name]    Family History Family History  Problem Relation Age of Onset  . Heart disease Father   . Hyperlipidemia Father   . Heart disease Maternal Grandmother     Social History Social History  Substance Use Topics  . Smoking status: Current Every Day Smoker    Packs/day: 1.00    Years: 31.00    Types: Cigarettes  . Smokeless tobacco: Former NeurosurgeonUser    Quit date: 04/27/2013  . Alcohol use 7.2 oz/week    12 Cans of beer per week     Comment: 1 case of beer a day     Allergies   Patient has no known allergies.   Review of Systems Review of Systems  Constitutional: Positive for fatigue. Negative for chills and fever.  HENT: Negative for congestion and rhinorrhea.   Eyes: Negative for visual disturbance.  Respiratory: Negative for cough, shortness of breath and wheezing.   Cardiovascular: Negative for chest pain and leg swelling.  Gastrointestinal: Negative for abdominal pain, diarrhea, nausea and vomiting.  Genitourinary: Negative for dysuria and flank pain.  Musculoskeletal: Negative for neck pain and neck stiffness.  Skin: Negative for rash and wound.  Allergic/Immunologic: Negative for immunocompromised state.  Neurological: Negative for syncope, weakness and headaches.  Psychiatric/Behavioral: Positive for dysphoric mood and suicidal ideas.  All other systems reviewed and are  negative.    Physical Exam Updated Vital Signs BP 171/93 (BP Location: Left Arm)   Pulse 95   Temp 97.7 F (36.5 C) (Oral)   Resp 18   Ht 5\' 10"  (1.778 m)   Wt 210 lb (95.3 kg)   SpO2 99%   BMI 30.13 kg/m   Physical Exam  Constitutional: He is oriented to person, place, and time. He appears well-developed and well-nourished. No distress.  HENT:  Head: Normocephalic and atraumatic.  Eyes: Conjunctivae are normal.  Neck: Neck supple.  Cardiovascular: Normal rate, regular rhythm and normal heart sounds.  Exam reveals no friction rub.   No murmur heard. Pulmonary/Chest: Effort normal and breath sounds normal. No respiratory distress. He has no wheezes. He has no rales.  Abdominal: He exhibits no distension.  Musculoskeletal: He exhibits no edema.  Neurological: He is alert and oriented to person, place, and time. He exhibits normal muscle tone.  Skin: Skin is warm. Capillary refill takes less than 2 seconds.  Psychiatric: He is withdrawn. He expresses impulsivity. He exhibits a depressed  mood. He expresses suicidal ideation.  Nursing note and vitals reviewed.    ED Treatments / Results  Labs (all labs ordered are listed, but only abnormal results are displayed) Labs Reviewed  COMPREHENSIVE METABOLIC PANEL - Abnormal; Notable for the following:       Result Value   Glucose, Bld 114 (*)    AST 52 (*)    All other components within normal limits  CBC WITH DIFFERENTIAL/PLATELET  ETHANOL  RAPID URINE DRUG SCREEN, HOSP PERFORMED  SALICYLATE LEVEL  ACETAMINOPHEN LEVEL  URINALYSIS, ROUTINE W REFLEX MICROSCOPIC (NOT [facility])    EKG  EKG Interpretation None       Radiology No results found.  Procedures Procedures (including critical care time)  Medications Ordered in ED Medications  traZODone (DESYREL) tablet 50 mg (not administered)  pantoprazole (PROTONIX) EC tablet 40 mg (40 mg Oral Given 04/17/16 1036)  nicotine (NICODERM CQ - dosed in mg/24 hours) patch 21  mg (21 mg Transdermal Patch Applied 04/17/16 1036)  metFORMIN (GLUCOPHAGE) tablet 500 mg (not administered)  lisinopril (PRINIVIL,ZESTRIL) tablet 20 mg (20 mg Oral Given 04/17/16 1036)  hydrOXYzine (ATARAX/VISTARIL) tablet 50 mg (not administered)  FLUoxetine (PROZAC) capsule 40 mg (40 mg Oral Given 04/17/16 1036)  ARIPiprazole (ABILIFY) tablet 5 mg (5 mg Oral Given 04/17/16 1036)     Initial Impression / Assessment and Plan / ED Course  I have reviewed the triage vital signs and the nursing notes.  Pertinent labs & imaging results that were available during my care of the patient were reviewed by me and considered in my medical decision making (see chart for details).  Clinical Course     45 year old male with history of chronic alcoholism, coronary disease, depression and previous suicide attempt who presents with worsening suicidal ideation and increasing alcohol use. On arrival, vital signs are stable. He has no evidence of withdrawal on my examination with no tremors, tachycardia, tachypnea, or auditory or visual hallucinations. He does not clinically appear intoxicated at this time. Screening lab work obtained and is largely unremarkable. Will place on his home medications and consult psychiatry. Patient medically stable for psychiatric disposition.  Final Clinical Impressions(s) / ED Diagnoses   Final diagnoses:  Alcohol abuse  Suicidal ideation      Shaune Pollackameron Bosten Newstrom, MD 04/17/16 1125

## 2016-04-17 NOTE — ED Notes (Signed)
Sandwich given to patient.

## 2016-04-17 NOTE — BH Assessment (Signed)
Assessment Note  Steven [name] is an 45 y.o. male with a history of Bipolar Disorder (Manic Depression). [facility]. He reports suicidal ideations, "Because I can't stop drinking daily". Patient's suicidal thoughts started 2 days ago. He was asked about a suicidal plan and replies, "Drinking a case of beer every day is my suicidal plan". Patient does not have firearms. Sts his access to means is his alcohol. He has tried to harm himself 2x's in the past. Previous suicidal triggers were related to alcoholism. Patient denies current self mutilating behaviors. Sts he has a history of cutting. Last cutting episode was 2 yrs ago. Today his primary stressor is finances. He currently lives in a hotel room and doesn't have any money. He reports on-going suicidal thoughts for several yrs. Sts that his thoughts have worsened in the past 3 to 4 days. His depressive symptoms include loss of interest in usual pleasures, hopelessness, fatigue, crying spells, and anger/irritability. Appetite is poor. Patient denies HI. He denies history of aggressive or assaultive behaviors. No legal issues. No AVH's. [facility]. He does not have a outpatient therapist or psychiatrist.  Sts that his father is his support system. [facility] well [facility] to [facility].  Clinician asked pt if he adhered to the recommendations for f/u for OP treatment after [facility]. Pt indicates he was busy working and could not follow up with the discharge plans provided to him on each visit. Today he sts, "I am no longer working so now I can follow up". Patient's substance of choice is alcohol. He also uses codeine, cocaine, and THC. SEE ADDITIONAL SOCIAL HISTORY FOR DETAILS RELATED TO PATIENT'S SUBSTANCE USE. Patient sts that he is currently withdrawing from substance use. He reports hot/cold flashes, diarrhea, nausea, and  agitation. He also has a history of seizures. Patient is unable to recall last seizure.   Diagnosis: Major Depressive Disorder, Recurrent, Severe, without psychotic features and Alcohol Use Disorder, Severe  Past Medical History:  Past Medical History:  Diagnosis Date  . Asthma   . Bipolar disorder (manic depression) (HCC)   . COPD (chronic obstructive pulmonary disease) (HCC)   . Coronary artery disease   . Hx of CABG   . Hypercholesteremia   . Hypertension   . Kidney stone   . Tobacco use disorder     Past Surgical History:  Procedure Laterality Date  . ABDOMINAL ANGIOGRAM  08/15/2012   Procedure: ABDOMINAL ANGIOGRAM;  Surgeon: Corky CraftsJayadeep S Varanasi, MD;  [facility];  Service: Cardiovascular;;  . APPENDECTOMY    . CARDIOVERSION N/A 08/28/2012   Procedure: Pseudo Compression;  Surgeon: Chuck Hinthristopher S Dickson, MD;  [facility];  Service: Cardiovascular;  Laterality: N/A;  . CORONARY ARTERY BYPASS GRAFT    . CYSTOSCOPY WITH RETROGRADE PYELOGRAM, URETEROSCOPY AND STENT PLACEMENT Right 12/31/2014   Procedure: CYSTOSCOPY  RIGHT URETEROSCOPY WITH STONE RETREIVAL RIGHT RETROGRADE PYELOGRAM;  Surgeon: Malen GauzePatrick L McKenzie, MD;  Location: [facility] ORS;  Service: Urology;  Laterality: Right;  . KIDNEY STONE SURGERY    . LOWER EXTREMITY ANGIOGRAM N/A 08/15/2012   Procedure: LOWER EXTREMITY ANGIOGRAM with possible PTA /stent;  Surgeon: Corky CraftsJayadeep S Varanasi, MD;  [facility];  Service: Cardiovascular;  Laterality: N/A;    Family History:  Family History  Problem Relation Age of Onset  . Heart disease Father   .  Hyperlipidemia Father   . Heart disease Maternal Grandmother     Social History:  reports that he has been smoking Cigarettes.  He has a 31.00 pack-year smoking history. He quit smokeless tobacco use about 2 years ago. He reports that he drinks about 7.2 oz of alcohol per week . He reports that he uses drugs, including Cocaine and Marijuana.  Additional Social  History:  Alcohol / Drug Use Pain Medications: denies Prescriptions: denies Over the Counter: denies History of alcohol / drug use?: Yes Longest period of sobriety (when/how long): 18 months from 2015-2016 Negative Consequences of Use: Financial, Legal, Personal relationships Withdrawal Symptoms: Tremors, Nausea / Vomiting, Sweats Substance #1 Name of Substance 1: Alcohol 1 - Age of First Use: Teenager 1 - Amount (size/oz): 12-24 beers; "sometimes a case per day if not more" 1 - Frequency: daily 1 - Duration: 1 year daily  1 - Last Use / Amount: "this morning about 3 am" Substance #2 Name of Substance 2: Cocaine 2 - Age of First Use: "Maybe 68 or 45 yrs old" 2 - Amount (size/oz): "1 gram or so" 2 - Frequency: "once or twice weekly" 2 - Duration: ongoing 2 - Last Use / Amount: 04/16/2016; 1 gram  Substance #3 Name of Substance 3: codeine 3 - Age of First Use: 40's 3 - Amount (size/oz): "Codeine 30's" 3 - Frequency: 1-2x's per week  3 - Duration: 1 year  3 - Last Use / Amount: 1 week ago  Substance #4 Name of Substance 4: THC 4 - Age of First Use: 45 yrs old  4 - Amount (size/oz): 1 gram per use 4 - Frequency: 1-2x's per week  4 - Duration: on-going  4 - Last Use / Amount: "couple of days ago"  CIWA: CIWA-Ar BP: 142/82 Pulse Rate: 80 COWS:    Allergies: No Known Allergies  Home Medications:  (Not in a hospital admission)  OB/GYN Status:  No LMP for male patient.  General Assessment Data Location of Assessment: [facility] ED TTS Assessment: In system Is this a Tele or Face-to-Face Assessment?: Face-to-Face Is this an Initial Assessment or a Re-assessment for this encounter?: Initial Assessment Marital status: Divorced [name] name:  (None ) Is patient pregnant?: No Pregnancy Status: No Living Arrangements: Other (Comment) ("I've been staying in motels lately") Can pt return to current living arrangement?: Yes Admission Status: Voluntary Is patient capable of signing  voluntary admission?: Yes Referral Source: Self/Family/Friend Insurance type:  ("I applied for disability"; currently self pay)     Crisis Care Plan Living Arrangements: Other (Comment) ("I've been staying in motels lately") Legal Guardian: Other: (no legal guardian ) Name of Psychiatrist:  (None Reported) Name of Therapist:  (No therapist )  Education Status Is patient currently in school?: No Current Grade:  (n/a) Highest grade of school patient has completed:  (GED) Name of school:  (n/a) Contact person:  (n/a)  Risk to self with the past 6 months Suicidal Ideation: Yes-Currently Present Has patient been a risk to self within the past 6 months prior to admission? : Yes Suicidal Intent: Yes-Currently Present Has patient had any suicidal intent within the past 6 months prior to admission? : Yes Is patient at risk for suicide?: Yes Suicidal Plan?: Yes-Currently Present Has patient had any suicidal plan within the past 6 months prior to admission? : Yes Specify Current Suicidal Plan:  ("Drink self to death") Access to Means: No Specify Access to Suicidal Means:  (denies ) What has been your use of  drugs/alcohol within the last 12 months?:  (alcohol, thc, cocaine, and codeine) Previous Attempts/Gestures: Yes How many times?:  (2x's) Other Self Harm Risks:  ("I use to do the cutting thing but I quiet"; last-1 yr ago ) Triggers for Past Attempts: Other (Comment) Intentional Self Injurious Behavior: None ("My life") Family Suicide History: No Recent stressful life event(s): Other (Comment), Financial Problems ("money, living arrangements, bills, etc.") Persecutory voices/beliefs?: No Depression: Yes Depression Symptoms: Feeling angry/irritable, Fatigue, Insomnia, Feeling worthless/self pity, Loss of interest in usual pleasures, Isolating, Despondent Substance abuse history and/or treatment for substance abuse?: Yes Suicide prevention information given to non-admitted patients:  Not applicable  Risk to Others within the past 6 months Homicidal Ideation: No Does patient have any lifetime risk of violence toward others beyond the six months prior to admission? : No Thoughts of Harm to Others: No Current Homicidal Intent: No Current Homicidal Plan: No Access to Homicidal Means: No Identified Victim:  (n/a) History of harm to others?: No Assessment of Violence: None Noted Violent Behavior Description:  (patient is calm and cooperative ) Does patient have access to weapons?: No Criminal Charges Pending?: No Does patient have a court date: No Is patient on probation?: No  Psychosis Hallucinations: None noted Delusions: None noted  Mental Status Report Appearance/Hygiene: Unremarkable, In scrubs Eye Contact: Fair Motor Activity: Freedom of movement Speech: Logical/coherent Level of Consciousness: Alert Mood: Anxious, Depressed Affect: Appropriate to circumstance, Sad Anxiety Level: Panic Attacks Panic attack frequency:  (1-2 times per week ) Most recent panic attack:  ("day before yesterday") Thought Processes: Relevant Judgement: Impaired Orientation: Person, Place, Time, Situation Obsessive Compulsive Thoughts/Behaviors: None  Cognitive Functioning Concentration: Decreased Memory: Recent Intact, Remote Intact IQ: Average Insight: Poor Impulse Control: Fair Appetite: Good Weight Loss:  (n/a) Weight Gain:  ("I eat to much...gained 35 pounds in a few months") Sleep: Decreased Total Hours of Sleep:  ("4-5 hrs on a good night") Vegetative Symptoms: None  ADLScreening [facility] Assessment Services) Patient's cognitive ability adequate to safely complete daily activities?: Yes Patient able to express need for assistance with ADLs?: Yes Independently performs ADLs?: Yes (appropriate for developmental age)  Prior Inpatient Therapy Prior Inpatient Therapy: Yes Prior Therapy Dates: multiple times Prior Therapy Facilty/Provider([facility]): [facility] Reason for  Treatment: SI, SA  Prior Outpatient Therapy Prior Outpatient Therapy: Yes Prior Therapy Dates: 2014 Prior Therapy Facilty/Provider(s): [facility] Reason for Treatment: bipolar; substance abuse Does patient have an ACCT team?: No Does patient have Intensive In-House Services?  : No Does patient have [facility] services? : No Does patient have P4CC services?: No  ADL Screening (condition at time of admission) Patient's cognitive ability adequate to safely complete daily activities?: Yes Is the patient deaf or have difficulty hearing?: No Does the patient have difficulty seeing, even when wearing glasses/contacts?: No Does the patient have difficulty concentrating, remembering, or making decisions?: No Patient able to express need for assistance with ADLs?: Yes Does the patient have difficulty dressing or bathing?: No Independently performs ADLs?: Yes (appropriate for developmental age) Does the patient have difficulty walking or climbing stairs?: No Weakness of Legs: None Weakness of Arms/Hands: None  Home Assistive Devices/Equipment Home Assistive Devices/Equipment: None    Abuse/Neglect Assessment (Assessment to be complete while patient is alone) Physical Abuse: Denies Verbal Abuse: Denies Sexual Abuse: Denies Exploitation of patient/patient's resources: Denies Self-Neglect: Denies Values / Beliefs Cultural Requests During Hospitalization: None   Advance Directives (For Healthcare) Does Patient Have a Medical Advance Directive?: No Nutrition Screen- [facility]  Patient's home diet: Regular  Additional Information 1:1 In Past 12 Months?: No CIRT Risk: No Elopement Risk: No Does patient have medical clearance?: Yes     Disposition:  Disposition Initial Assessment Completed for this Encounter: Yes (Patient accepted to the OBS unit/Bed #1) Disposition of Patient: Other dispositions (Patient meets criteria for the OBS unit, per Drenda FreezeFran, NP) Other disposition(s): Other  (Comment) (Patient meets criteria for the OBS unit, per Drenda FreezeFran, NP)  On Site Evaluation by:   Reviewed with Physician:  Drenda FreezeFran, NP  Melynda Rippleoyka Kenaz Olafson 04/17/2016 3:58 PM

## 2016-04-17 NOTE — ED Notes (Signed)
Introduced self to patient/family. Pt oriented to unit expectations.  Assessed pt for:  A) Anxiety &/or agitation: On admission to the [facility] pt is calm and cooperative. His affect is flat and he is depressed. Denies SI/HI, but wants help with alcohol. States he drinks a case of beer a day.  S) Safety: Safety maintained with q-15-minute checks and hourly rounds by staff. Pt assessed for contraband on admission and none found.  A) ADLs: Pt able to perform ADLs independently.  P) Pick-Up (room cleanliness): Pt's room clean and free of clutter.

## 2016-04-17 NOTE — BH Assessment (Signed)
Assessment Note  Steven [name] is an 45 y.o. male.   Diagnosis:  Past Medical History:  Past Medical History:  Diagnosis Date  . Asthma   . Bipolar disorder (manic depression) (HCC)   . COPD (chronic obstructive pulmonary disease) (HCC)   . Coronary artery disease   . Hx of CABG   . Hypercholesteremia   . Hypertension   . Kidney stone   . Tobacco use disorder     Past Surgical History:  Procedure Laterality Date  . ABDOMINAL ANGIOGRAM  08/15/2012   Procedure: ABDOMINAL ANGIOGRAM;  Surgeon: Corky CraftsJayadeep S Varanasi, MD;  [facility];  Service: Cardiovascular;;  . APPENDECTOMY    . CARDIOVERSION N/A 08/28/2012   Procedure: Pseudo Compression;  Surgeon: Chuck Hinthristopher S Dickson, MD;  [facility];  Service: Cardiovascular;  Laterality: N/A;  . CORONARY ARTERY BYPASS GRAFT    . CYSTOSCOPY WITH RETROGRADE PYELOGRAM, URETEROSCOPY AND STENT PLACEMENT Right 12/31/2014   Procedure: CYSTOSCOPY  RIGHT URETEROSCOPY WITH STONE RETREIVAL RIGHT RETROGRADE PYELOGRAM;  Surgeon: Malen GauzePatrick L McKenzie, MD;  Location: [facility] OR[facility];  Service: Urology;  Laterality: Right;  . KIDNEY STONE SURGERY    . LOWER EXTREMITY ANGIOGRAM N/A 08/15/2012   Procedure: LOWER EXTREMITY ANGIOGRAM with possible PTA /stent;  Surgeon: Corky CraftsJayadeep S Varanasi, MD;  [facility];  Service: Cardiovascular;  Laterality: N/A;    Family History:  Family History  Problem Relation Age of Onset  . Heart disease Father   . Hyperlipidemia Father   . Heart disease Maternal Grandmother     Social History:  reports that he has been smoking Cigarettes.  He has a 31.00 pack-year smoking history. He quit smokeless tobacco use about 2 years ago. He reports that he drinks about 7.2 oz of alcohol per week . He reports that he uses drugs, including Cocaine and Marijuana.  Additional Social History:  Alcohol / Drug Use Pain Medications: denies Prescriptions: denies Over the Counter: denies History of alcohol / drug use?:  Yes Longest period of sobriety (when/how long): 18 months from 2015-2016 Negative Consequences of Use: Financial, Legal, Personal relationships Withdrawal Symptoms: Tremors, Nausea / Vomiting, Sweats Substance #1 Name of Substance 1: Alcohol 1 - Age of First Use: Teenager 1 - Amount (size/oz): 12-24 beers; "sometimes a case per day if not more" 1 - Frequency: daily 1 - Duration: 1 year daily  1 - Last Use / Amount: "this morning about 3 am" Substance #2 Name of Substance 2: Cocaine 2 - Age of First Use: "Maybe [age] or 45 yrs old" 2 - Amount (size/oz): "1 gram or so" 2 - Frequency: "once or twice weekly" 2 - Duration: ongoing 2 - Last Use / Amount: 04/16/2016; 1 gram  Substance #3 Name of Substance 3: codeine 3 - Age of First Use: 40's 3 - Amount (size/oz): "Morphine 30's" 3 - Frequency: 1-2x's per week  3 - Duration: 1 year  3 - Last Use / Amount: 1 week ago  Substance #4 Name of Substance 4: THC 4 - Age of First Use: [age] yrs old  4 - Amount (size/oz): 1 gram per use 4 - Frequency: 1-2x's per week  4 - Duration: on-going  4 - Last Use / Amount: "couple of days ago"  CIWA: CIWA-Ar BP: 142/82 Pulse Rate: 80 COWS:    Allergies: No Known Allergies  Home Medications:  (Not in a hospital admission)  OB/GYN Status:  No LMP for male patient.  General Assessment Data Location of Assessment: [facility]  ED TTS Assessment: In system Is this a Tele or Face-to-Face Assessment?: Face-to-Face Is this an Initial Assessment or a Re-assessment for this encounter?: Initial Assessment Marital status: Divorced Liberty name:  (None ) Is patient pregnant?: No Pregnancy Status: No Living Arrangements: Other (Comment) ("I've been staying in motels lately") Can pt return to current living arrangement?: Yes Admission Status: Voluntary Is patient capable of signing voluntary admission?: Yes Referral Source: Self/Family/Friend Insurance type:  ("I applied for disability"; currently self pay)      Crisis Care Plan Living Arrangements: Other (Comment) ("I've been staying in motels lately") Legal Guardian: Other: (no legal guardian ) Name of Psychiatrist:  (None Reported) Name of Therapist:  (No therapist )  Education Status Is patient currently in school?: No Current Grade:  (n/a) Highest grade of school patient has completed:  (GED) Name of school:  (n/a) Contact person:  (n/a)  Risk to self with the past 6 months Suicidal Ideation: Yes-Currently Present Has patient been a risk to self within the past 6 months prior to admission? : Yes Suicidal Intent: Yes-Currently Present Has patient had any suicidal intent within the past 6 months prior to admission? : Yes Is patient at risk for suicide?: Yes Suicidal Plan?: Yes-Currently Present Has patient had any suicidal plan within the past 6 months prior to admission? : Yes Specify Current Suicidal Plan:  ("Drink self to death") Access to Means: No Specify Access to Suicidal Means:  (denies ) What has been your use of drugs/alcohol within the last 12 months?:  (alcohol, thc, cocaine, and codeine) Previous Attempts/Gestures: Yes How many times?:  (2x's) Other Self Harm Risks:  ("I use to do the cutting thing but I quiet"; last-1 yr ago ) Triggers for Past Attempts: Other (Comment) Intentional Self Injurious Behavior: None ("My life") Family Suicide History: No Recent stressful life event(s): Other (Comment), Financial Problems ("money, living arrangements, bills, etc.") Persecutory voices/beliefs?: No Depression: Yes Depression Symptoms: Feeling angry/irritable, Fatigue, Insomnia, Feeling worthless/self pity, Loss of interest in usual pleasures, Isolating, Despondent Substance abuse history and/or treatment for substance abuse?: No Suicide prevention information given to non-admitted patients: Not applicable  Risk to Others within the past 6 months Homicidal Ideation: No Does patient have any lifetime risk of violence toward  others beyond the six months prior to admission? : No Thoughts of Harm to Others: No Current Homicidal Intent: No Current Homicidal Plan: No Access to Homicidal Means: No Identified Victim:  (n/a) History of harm to others?: No Assessment of Violence: None Noted Violent Behavior Description:  (patient is calm and cooperative ) Does patient have access to weapons?: No Criminal Charges Pending?: No Does patient have a court date: No Is patient on probation?: No  Psychosis Hallucinations: None noted Delusions: None noted  Mental Status Report Appearance/Hygiene: Unremarkable, In scrubs Eye Contact: Fair Motor Activity: Unremarkable Speech: Logical/coherent Level of Consciousness: Alert Mood: Anxious, Depressed Affect: Appropriate to circumstance, Sad Anxiety Level: Panic Attacks Panic attack frequency:  (1-2 times per week ) Most recent panic attack:  ("day before yesterday") Thought Processes: Relevant Judgement: Impaired Orientation: Person, Place, Time, Situation Obsessive Compulsive Thoughts/Behaviors: None  Cognitive Functioning Concentration: Decreased Memory: Recent Intact, Remote Intact IQ: Average Insight: Poor Impulse Control: Fair Appetite: Good Weight Loss:  (n/a) Weight Gain:  ("I eat to much...gained 35 pounds in a few months") Sleep: Decreased Total Hours of Sleep:  ("4-5 hrs on a good night") Vegetative Symptoms: None  ADLScreening [facility] Assessment Services) Patient's cognitive ability adequate to safely complete daily  activities?: Yes Patient able to express need for assistance with ADLs?: Yes Independently performs ADLs?: Yes (appropriate for developmental age)  Prior Inpatient Therapy Prior Inpatient Therapy: Yes Prior Therapy Dates: multiple times [facility] Reason for Treatment: SI, SA  Prior Outpatient Therapy Prior Outpatient Therapy: Yes Prior Therapy Dates: 2014 Prior Therapy [facility]ilty[facility]Provider[facility]): [facility] Reason for Treatment: bipolar; substance abuse Does patient have an ACCT team?: No Does patient have Intensive In-House Services?  : No Does patient have [facility] services? : No Does patient have P4CC services?: No  ADL Screening (condition at time of admission) Patient's cognitive ability adequate to safely complete daily activities?: Yes Is the patient deaf or have difficulty hearing?: No Does the patient have difficulty seeing, even when wearing glasses/contacts?: No Does the patient have difficulty concentrating, remembering, or making decisions?: No Patient able to express need for assistance with ADLs?: Yes Does the patient have difficulty dressing or bathing?: No Independently performs ADLs?: Yes (appropriate for developmental age) Does the patient have difficulty walking or climbing stairs?: No Weakness of Legs: None Weakness of Arms/Hands: None  Home Assistive Devices/Equipment Home Assistive Devices/Equipment: None    Abuse/Neglect Assessment (Assessment to be complete while patient is alone) Physical Abuse: Denies Verbal Abuse: Denies Sexual Abuse: Denies Exploitation of patient/patient's resources: Denies Self-Neglect: Denies Values / Beliefs Cultural Requests During Hospitalization: None   Advance Directives (For Healthcare) Does Patient Have a Medical Advance Directive?: No Nutrition Screen- [facility] Patient's home diet: Regular  Additional Information 1:1 In Past 12 Months?: No CIRT Risk: No Elopement Risk: No Does patient have medical clearance?: Yes     Disposition:  Disposition Initial Assessment Completed for this Encounter: Yes  On Site Evaluation by:   Reviewed with Physician:    Steven [name] 04/17/2016 3:26 PM

## 2016-04-17 NOTE — ED Notes (Signed)
[facility]. Patient left with [facility] transportation service. Patient alert and stable. Patient ambulatory. No concerns voiced by patient. Patient belongings and transport papers given to transportation driver. [facility].

## 2016-04-17 NOTE — ED Notes (Signed)
Attempted to call report to Observation Unit. Staff said that they will call when they are ready for the patient.

## 2016-04-17 NOTE — ED Notes (Signed)
ED Provider at bedside. 

## 2016-04-18 ENCOUNTER — Observation Stay (HOSPITAL_COMMUNITY)
Admission: AD | Admit: 2016-04-18 | Discharge: 2016-04-20 | Disposition: A | Discharge status: Home / Self Care | Payer: No Typology Code available for payment source | Source: Intra-hospital | Attending: Psychiatry | Admitting: Psychiatry

## 2016-04-18 ENCOUNTER — Encounter (HOSPITAL_COMMUNITY): Payer: Self-pay | Admitting: *Deleted

## 2016-04-18 DIAGNOSIS — Z8249 Family history of ischemic heart disease and other diseases of the circulatory system: Secondary | ICD-10-CM | POA: Diagnosis not present

## 2016-04-18 DIAGNOSIS — Z79899 Other long term (current) drug therapy: Secondary | ICD-10-CM | POA: Diagnosis not present

## 2016-04-18 DIAGNOSIS — I2510 Atherosclerotic heart disease of native coronary artery without angina pectoris: Secondary | ICD-10-CM | POA: Insufficient documentation

## 2016-04-18 DIAGNOSIS — Z9889 Other specified postprocedural states: Secondary | ICD-10-CM | POA: Diagnosis not present

## 2016-04-18 DIAGNOSIS — F332 Major depressive disorder, recurrent severe without psychotic features: Principal | ICD-10-CM | POA: Insufficient documentation

## 2016-04-18 DIAGNOSIS — J449 Chronic obstructive pulmonary disease, unspecified: Secondary | ICD-10-CM | POA: Insufficient documentation

## 2016-04-18 DIAGNOSIS — I10 Essential (primary) hypertension: Secondary | ICD-10-CM | POA: Insufficient documentation

## 2016-04-18 DIAGNOSIS — Z951 Presence of aortocoronary bypass graft: Secondary | ICD-10-CM | POA: Insufficient documentation

## 2016-04-18 DIAGNOSIS — F17210 Nicotine dependence, cigarettes, uncomplicated: Secondary | ICD-10-CM | POA: Insufficient documentation

## 2016-04-18 MED ORDER — HYDROXYZINE HCL 50 MG PO TABS
50.0000 mg | ORAL_TABLET | Freq: Three times a day (TID) | ORAL | Status: DC | PRN
Start: 2016-04-18 — End: 2016-04-20
  Administered 2016-04-18 – 2016-04-20 (×5): 50 mg via ORAL
  Filled 2016-04-18 (×2): qty 1
  Filled 2016-04-18: qty 10
  Filled 2016-04-18 (×4): qty 1

## 2016-04-18 MED ORDER — ONDANSETRON HCL 4 MG PO TABS: 4.0000 mg | ORAL_TABLET | Freq: Three times a day (TID) | ORAL | Status: DC | PRN

## 2016-04-18 MED ORDER — FLUOXETINE HCL 20 MG PO CAPS
40.0000 mg | ORAL_CAPSULE | Freq: Every day | ORAL | Status: DC
  Administered 2016-04-18 – 2016-04-20 (×3): 40 mg via ORAL
  Filled 2016-04-18 (×3): qty 2
  Filled 2016-04-18: qty 14

## 2016-04-18 MED ORDER — VITAMIN B-1 100 MG PO TABS
100.0000 mg | ORAL_TABLET | Freq: Every day | ORAL | Status: DC
  Administered 2016-04-19 – 2016-04-20 (×2): 100 mg via ORAL
  Filled 2016-04-18 (×2): qty 1

## 2016-04-18 MED ORDER — LOPERAMIDE HCL 2 MG PO CAPS: 2.0000 mg | ORAL_CAPSULE | ORAL | Status: DC | PRN

## 2016-04-18 MED ORDER — ONDANSETRON 4 MG PO TBDP: 4.0000 mg | ORAL_TABLET | Freq: Four times a day (QID) | ORAL | Status: DC | PRN

## 2016-04-18 MED ORDER — LORAZEPAM 1 MG PO TABS: 0.0000 mg | ORAL_TABLET | Freq: Two times a day (BID) | ORAL | Status: DC

## 2016-04-18 MED ORDER — LORAZEPAM 1 MG PO TABS
1.0000 mg | ORAL_TABLET | Freq: Three times a day (TID) | ORAL | Status: AC
  Administered 2016-04-19 (×3): 1 mg via ORAL
  Filled 2016-04-18 (×3): qty 1

## 2016-04-18 MED ORDER — ARIPIPRAZOLE 5 MG PO TABS
5.0000 mg | ORAL_TABLET | Freq: Every day | ORAL | Status: DC
  Administered 2016-04-18 – 2016-04-20 (×3): 5 mg via ORAL
  Filled 2016-04-18: qty 1
  Filled 2016-04-18: qty 7
  Filled 2016-04-18 (×2): qty 1

## 2016-04-18 MED ORDER — THIAMINE HCL 100 MG/ML IJ SOLN
100.0000 mg | Freq: Once | INTRAMUSCULAR | Status: DC
  Filled 2016-04-18: qty 2

## 2016-04-18 MED ORDER — LORAZEPAM 1 MG PO TABS
1.0000 mg | ORAL_TABLET | Freq: Four times a day (QID) | ORAL | Status: AC
  Administered 2016-04-18 (×4): 1 mg via ORAL
  Filled 2016-04-18 (×4): qty 1

## 2016-04-18 MED ORDER — ADULT MULTIVITAMIN W/MINERALS CH
1.0000 | ORAL_TABLET | Freq: Every day | ORAL | Status: DC
  Administered 2016-04-18 – 2016-04-20 (×3): 1 via ORAL
  Filled 2016-04-18 (×3): qty 1

## 2016-04-18 MED ORDER — HYDROXYZINE HCL 25 MG PO TABS: 25.0000 mg | ORAL_TABLET | Freq: Four times a day (QID) | ORAL | Status: DC | PRN

## 2016-04-18 MED ORDER — ACETAMINOPHEN 325 MG PO TABS
650.0000 mg | ORAL_TABLET | ORAL | Status: DC | PRN
  Administered 2016-04-18 – 2016-04-19 (×2): 650 mg via ORAL
  Filled 2016-04-18: qty 2

## 2016-04-18 MED ORDER — PANTOPRAZOLE SODIUM 40 MG PO TBEC
40.0000 mg | DELAYED_RELEASE_TABLET | Freq: Every day | ORAL | Status: DC
  Administered 2016-04-18 – 2016-04-20 (×3): 40 mg via ORAL
  Filled 2016-04-18 (×2): qty 1
  Filled 2016-04-18: qty 7
  Filled 2016-04-18: qty 1

## 2016-04-18 MED ORDER — LORAZEPAM 1 MG PO TABS
ORAL_TABLET | ORAL | Status: AC
  Administered 2016-04-18: 07:00:00
  Filled 2016-04-18: qty 1

## 2016-04-18 MED ORDER — ACETAMINOPHEN 325 MG PO TABS
ORAL_TABLET | ORAL | Status: AC
  Administered 2016-04-18: 07:00:00
  Filled 2016-04-18: qty 2

## 2016-04-18 MED ORDER — THIAMINE HCL 100 MG/ML IJ SOLN: 100.0000 mg | Freq: Every day | INTRAMUSCULAR | Status: DC

## 2016-04-18 MED ORDER — TRAZODONE HCL 50 MG PO TABS
50.0000 mg | ORAL_TABLET | Freq: Every evening | ORAL | Status: DC | PRN
  Administered 2016-04-18 – 2016-04-19 (×2): 50 mg via ORAL
  Filled 2016-04-18: qty 7
  Filled 2016-04-18 (×2): qty 1

## 2016-04-18 MED ORDER — ALUM & MAG HYDROXIDE-SIMETH 200-200-20 MG/5ML PO SUSP: 30.0000 mL | ORAL | Status: DC | PRN

## 2016-04-18 MED ORDER — LORAZEPAM 1 MG PO TABS
0.0000 mg | ORAL_TABLET | Freq: Four times a day (QID) | ORAL | Status: DC
  Administered 2016-04-18 (×2): 1 mg via ORAL

## 2016-04-18 MED ORDER — LORAZEPAM 1 MG PO TABS: 1.0000 mg | ORAL_TABLET | Freq: Every day | ORAL | Status: DC

## 2016-04-18 MED ORDER — METFORMIN HCL 500 MG PO TABS
500.0000 mg | ORAL_TABLET | Freq: Every day | ORAL | Status: DC
  Administered 2016-04-18 – 2016-04-20 (×3): 500 mg via ORAL
  Filled 2016-04-18 (×3): qty 1
  Filled 2016-04-18: qty 7

## 2016-04-18 MED ORDER — VITAMIN B-1 100 MG PO TABS: 100.0000 mg | ORAL_TABLET | Freq: Every day | ORAL | Status: DC

## 2016-04-18 MED ORDER — LORAZEPAM 1 MG PO TABS
ORAL_TABLET | ORAL | Status: AC
  Administered 2016-04-18: 06:00:00
  Filled 2016-04-18: qty 1

## 2016-04-18 MED ORDER — LORAZEPAM 1 MG PO TABS
1.0000 mg | ORAL_TABLET | Freq: Two times a day (BID) | ORAL | Status: DC
  Administered 2016-04-20: 1 mg via ORAL
  Filled 2016-04-18: qty 1

## 2016-04-18 MED ORDER — LISINOPRIL 20 MG PO TABS
20.0000 mg | ORAL_TABLET | Freq: Every day | ORAL | Status: DC
  Administered 2016-04-18 – 2016-04-20 (×3): 20 mg via ORAL
  Filled 2016-04-18: qty 1
  Filled 2016-04-18: qty 7
  Filled 2016-04-18 (×2): qty 1

## 2016-04-18 MED ORDER — NICOTINE 21 MG/24HR TD PT24
21.0000 mg | MEDICATED_PATCH | Freq: Every day | TRANSDERMAL | Status: DC
  Administered 2016-04-18 – 2016-04-20 (×3): 21 mg via TRANSDERMAL
  Filled 2016-04-18 (×3): qty 1

## 2016-04-18 NOTE — Progress Notes (Signed)
D: Patient appropriate on unit. Presents with sad affect. Denies pain, SI/HI, AH/VH at this time. No behavioral issues noted. Patient made no new complaint. A: Staff offered support, encouraged to continue with the treatment plan and verbalize needs to staff. Due meds given as ordered. Constant observation for safety maintained except when patient is in the bathroom. Will continue to moniotr patient for safety and stability.  R: Patient remains safe.

## 2016-04-18 NOTE — Progress Notes (Signed)
[facility] INPATIENT:  Family/Significant Other Suicide Prevention Education  Suicide Prevention Education:  Patient Refusal for Family/Significant Other Suicide Prevention Education: The patient Steven [name] has refused to provide written consent for family/significant other to be provided Family/Significant Other Suicide Prevention Education during admission and/or prior to discharge.  Physician notified.  Steven [name], Steven [name] 04/18/2016, 2:17 AM
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
## 2016-04-18 NOTE — Progress Notes (Signed)
D:  Patient oriented x 4; he denies suicidal and homicidal ideation and AVH; no self-injurious behaviors noted or reported. A:  Medications given as scheduled;  Emotional support provided; encouraged him to seek assistance with needs/concerns. R:  Safety maintained on unit. 

## 2016-04-18 NOTE — Progress Notes (Signed)
Admission Note:  Patient is a 45 year old male [facility]. On admission, patient presents with a flat affect and depressed mood. Patient stated "I am here because I can't stop drinking. I was suicidal as a result of my excessive drinking but not now". Reports withdrawing from alcohol with the following signs: hot/cold flashes, anxiety, agitation, diarrhea and nausea. Patient denies pain, SI, AH/VH at this time.   Patient was calm and cooperative with the admission process.  Denies any hx of sexual, physical and emotional abuse.   A: Skin/body search done. No contraband seen. Scar noted at the chest and healed wound at the right inner thigh. No tattoo noted. POC and unit policies explained and understanding verbalized. Consents obtained. Accepted food and fluids offered.  R: Patient had no additional questions or concerns.

## 2016-04-18 NOTE — Progress Notes (Signed)
Patient complained of body pain of 8/10. Accepted PRN of acetaminophen 650 mg. Will reassess patient.

## 2016-04-19 DIAGNOSIS — Z9889 Other specified postprocedural states: Secondary | ICD-10-CM | POA: Diagnosis not present

## 2016-04-19 DIAGNOSIS — R45851 Suicidal ideations: Secondary | ICD-10-CM

## 2016-04-19 DIAGNOSIS — F332 Major depressive disorder, recurrent severe without psychotic features: Principal | ICD-10-CM

## 2016-04-19 DIAGNOSIS — Z79899 Other long term (current) drug therapy: Secondary | ICD-10-CM

## 2016-04-19 DIAGNOSIS — Z8349 Family history of other endocrine, nutritional and metabolic diseases: Secondary | ICD-10-CM

## 2016-04-19 DIAGNOSIS — Z8249 Family history of ischemic heart disease and other diseases of the circulatory system: Secondary | ICD-10-CM | POA: Diagnosis not present

## 2016-04-19 DIAGNOSIS — F17210 Nicotine dependence, cigarettes, uncomplicated: Secondary | ICD-10-CM
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
## 2016-04-19 NOTE — Progress Notes (Signed)
Pt sleeping throughout the day. Easily around and responsive. Pt mood sullen. Shows no initiative when asked about plans for discharge. States "I don't really have a reason to stop drinking. My life isn't any better when I drink or when I'm sober."  Complained of anxiety and given PRN Hydroxyzine 50 mg po at 1112 with good results on follow up. Denies SI/HI/AVH and contracted for safety.

## 2016-04-19 NOTE — Progress Notes (Signed)
Patient was asked if he was ready to make some calls to some of the Facility's in order to find a placement.  He stated he already knew where he wanted to go.  When [facility] was asked [facility].  He was then asked if he didn't think an In-Patient program might not be better and he stated it didn't really matter he had done that too.  Patient didn't appear very invested in getting better.  He stated it didn't make much of a difference when he was drunk or not.

## 2016-04-19 NOTE — Progress Notes (Signed)
Patient continues to state he doesn't feel well.  He did state he would need to find a place to go to when he leaves here. Patient slept most of the morning and chose to not make any calls for a placement.  Patient will be encouraged to make calls to look for a placement after lunch.

## 2016-04-19 NOTE — Progress Notes (Signed)
Pt sleeping in bed at shift change.  Pt up to bathroom and responses to assesment questions with short answers.  Pt denies SI, HI and AVH.  Pt denies pain of discomfort at this time.  Pt interested in when he can get his sleep meds and goes back to bed once he has answer. Pt is continuously observed on unit for safety except when in bathroom. Pt remains safe

## 2016-04-20 MED ORDER — HYDROXYZINE HCL 50 MG PO TABS: 50.0000 mg | ORAL_TABLET | Freq: Three times a day (TID) | ORAL | 0 refills | Status: DC | PRN

## 2016-04-20 MED ORDER — NICOTINE 21 MG/24HR TD PT24: 21.0000 mg | MEDICATED_PATCH | Freq: Every day | TRANSDERMAL | 0 refills | Status: DC

## 2016-04-20 MED ORDER — FLUOXETINE HCL 40 MG PO CAPS: 40.0000 mg | ORAL_CAPSULE | Freq: Every day | ORAL | 0 refills | Status: DC

## 2016-04-20 MED ORDER — PANTOPRAZOLE SODIUM 40 MG PO TBEC: 40.0000 mg | DELAYED_RELEASE_TABLET | Freq: Every day | ORAL | 0 refills | Status: DC

## 2016-04-20 MED ORDER — LISINOPRIL 20 MG PO TABS: 20.0000 mg | ORAL_TABLET | Freq: Every day | ORAL | 0 refills | Status: AC

## 2016-04-20 MED ORDER — METFORMIN HCL 500 MG PO TABS: 500.0000 mg | ORAL_TABLET | Freq: Every day | ORAL | 0 refills | Status: DC

## 2016-04-20 MED ORDER — ARIPIPRAZOLE 5 MG PO TABS: 5.0000 mg | ORAL_TABLET | Freq: Every day | ORAL | 0 refills | Status: DC

## 2016-04-20 MED ORDER — TRAZODONE HCL 50 MG PO TABS: 50.0000 mg | ORAL_TABLET | Freq: Every evening | ORAL | 0 refills | Status: DC | PRN

## 2016-04-20 NOTE — Progress Notes (Signed)
Pt discharged to home alert and [facility] @ 1050. Denied SI/HI/AVH and in a jovial mood. All personal belongings given to pt along with take home medications with instructions, AVS and BH transition record. Pt verbalized that he will follow up with Social Services for alcohol treatment and housing options.

## 2016-04-20 NOTE — Discharge Summary (Signed)
Physician Discharge Summary Note  Patient:  Steven [name] is an 45 y.o., male   MRN:  [id number] DOB:  1971/01/09 Patient phone:  [phone] (home)  Patient address:   [address] [place] [address],  Total Time spent with patient: 15 minutes  Date of Admission:  04/18/2016 Date of Discharge: 04/20/2016  Reason for Admission:  Substance induced mood disorder  Principal Problem: MDD (major depressive disorder), recurrent severe, without psychosis [facility]) Discharge Diagnoses: Patient Active Problem List   Diagnosis Date Noted  . Polysubstance abuse [F19.10] 02/23/2016    Priority: High  . Opiate dependence (HCC) [F11.20] 02/23/2016    Priority: High  . Cocaine abuse with cocaine-induced mood disorder (HCC) [F14.14] 02/21/2016    Priority: High  . MDD (major depressive disorder), recurrent severe, without psychosis (HCC) [F33.2] 06/18/2015    Priority: High  . Substance induced mood disorder (HCC) [F19.94] 03/08/2015    Priority: High  . Suicidal ideation [R45.851]     Priority: Medium  . COPD (chronic obstructive pulmonary disease) (HCC) [J44.9] 02/09/2015  . Tobacco use disorder [F17.200] 02/09/2015  . Stimulant use disorder (cocaine) [F15.90] 02/09/2015  . Ureteral stone [N20.1] 12/31/2014  . 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 CAD s/p CABG 2012 [facility] [I25.810] 11/28/2011  . Hyperlipidemia LDL goal <100 [E78.5] 03/13/2007  . Essential hypertension [I10] 03/13/2007  . GERD [K21.9] 03/13/2007    Past Psychiatric History: Bipolar disorder, Polysubstance abuse, Opiate abuse, cocaine abuse  Past Medical History:  Past Medical History:  Diagnosis Date  . Asthma   . Bipolar disorder (manic depression) (HCC)   . COPD (chronic obstructive pulmonary disease) (HCC)   . Coronary artery disease   . Hx of CABG   . Hypercholesteremia   . Hypertension   . Kidney stone   . Tobacco use disorder     Past Surgical History:   Procedure Laterality Date  . ABDOMINAL ANGIOGRAM  08/15/2012   Procedure: ABDOMINAL ANGIOGRAM;  Surgeon: Corky Crafts, MD;  Location: [facility] CATH LAB;  Service: Cardiovascular;;  . APPENDECTOMY    . CARDIOVERSION N/A 08/28/2012   Procedure: Pseudo Compression;  Surgeon: Chuck Hint, MD;  Location[facility] CATH LAB;  Service: Cardiovascular;  Laterality: N/A;  . CORONARY ARTERY BYPASS GRAFT    . CYSTOSCOPY WITH RETROGRADE PYELOGRAM, URETEROSCOPY AND STENT PLACEMENT Right 12/31/2014   Procedure: CYSTOSCOPY  RIGHT URETEROSCOPY WITH STONE RETREIVAL RIGHT RETROGRADE PYELOGRAM;  Surgeon: Malen Gauze, MD;  [facility] ORS;  Service: Urology;  Laterality: Right;  . KIDNEY STONE SURGERY    . LOWER EXTREMITY ANGIOGRAM N/A 08/15/2012   Procedure: LOWER EXTREMITY ANGIOGRAM with possible PTA /stent;  Surgeon: Corky Crafts, MD;  Location: [facility] CATH LAB;  Service: Cardiovascular;  Laterality: N/A;   Family History:  Family History  Problem Relation Age of Onset  . Heart disease Father   . Hyperlipidemia Father   . Heart disease Maternal Grandmother    Family Psychiatric  History: Unknown Social History:  History  Alcohol Use  . 7.2 oz/week  . 12 Cans of beer per week    Comment: 1 case of beer a day     History  Drug Use  . Types: Cocaine, Marijuana    Social History   Social History  . Marital status: Divorced    Spouse name: N/A  . Number of children: N/A  . Years of education: N/A   Social History Main Topics  . Smoking  status: Current Every Day Smoker    Packs/day: 1.00    Years: 31.00    Types: Cigarettes  . Smokeless tobacco: Former NeurosurgeonUser    Quit date: 04/27/2013  . Alcohol use 7.2 oz/week    12 Cans of beer per week     Comment: 1 case of beer a day  . Drug use:     Types: Cocaine, Marijuana  . Sexual activity: Yes    Birth control/ protection: None   Other Topics Concern  . None   Social History Narrative  . None    Hospital Course:  Steven SitesRoger Wiechman  is a 45 year old caucasian male who [facility] with substance abuse mood disorder. Pt's UDS was + for cocaine and marijuana however pt claimed to have drinking heavily, UDS - for ETOH. Pt stated he wants help with his substance abuse addiction and [facility]. Pt is well known to this facility and many area emergency rooms and has had multiple admissions this year (see previous note). [facility] after a four day treatment stay. [facility] but only [facility] and left stating "I have to work so I can have money and I don't have time for all of that." Pt demonstrates a high degree of personal gain from [facility].   [facility] incident. Pt was calm and cooperative, alert & oriented x 4, dressed in paper scrubs and lying on the bed. Pt was not unreasonable with discharge planning and stated he will need his 14 day supply of medication until [facility] and medication management.   Physical Findings: AIMS: Facial and Oral Movements Muscles of Facial Expression: None, normal Lips and Perioral Area: None, normal Jaw: None, normal Tongue: None, normal,Extremity Movements Upper (arms, wrists, hands, fingers): None, normal Lower (legs, knees, ankles, toes): None, normal, Trunk Movements Neck, shoulders, hips: None, normal, Overall Severity Severity of abnormal movements (highest score from questions above): None, normal Incapacitation due to abnormal movements: None, normal Patient's awareness of abnormal movements (rate only patient's report): No Awareness, Dental Status Current problems with teeth and/or dentures?: No Does patient usually wear dentures?: No  CIWA:  CIWA-Ar Total: 2 COWS:     Musculoskeletal: Strength &  Muscle Tone: within normal limits Gait & Station: normal Patient leans: N/A  Psychiatric Specialty Exam: Physical Exam  Constitutional: He appears well-developed and well-nourished.  HENT:  Head: Normocephalic.  Musculoskeletal: Normal range of motion.    Review of Systems  Psychiatric/Behavioral: Positive for depression, substance abuse and suicidal ideas. Negative for hallucinations and memory loss. The patient is not nervous/anxious and does not have insomnia.   All other systems reviewed and are negative.   Blood pressure 124/86, pulse 89, temperature 98 F (36.7 C), resp. rate 18, height 5\' 10"  (1.778 m), weight 96.6 kg (213 lb), SpO2 100 %.Body mass index is 30.56 kg/m.  General Appearance: Casual  Eye Contact:  Good  Speech:  Clear and Coherent and Normal Rate  Volume:  Normal  Mood:  Depressed and Hopeless  Affect:  Congruent  Thought Process:  Coherent, Goal Directed and Linear  Orientation:  Full (Time, Place, and Person)  Thought Content:  Logical  Suicidal Thoughts:  Yes.  with  intent/plan, passive plan   Homicidal Thoughts:  No  Memory:  Immediate;   Good Recent;   Good Remote;   Fair  Judgement:  Fair  Insight:  Fair  Psychomotor Activity:  Normal  Concentration:  Concentration: Good and Attention Span: Good  Recall:  Good  Fund of Knowledge:  Good  Language:  Good  Akathisia:  No  Handed:  Right  AIMS (if indicated):     Assets:  Communication Skills Desire for Improvement Housing Resilience Social Support Transportation  ADL's:  Intact  Cognition:  WNL  Sleep:           Has this patient used any form of tobacco in the last 30 days? (Cigarettes, Smokeless Tobacco, Cigars, and/or Pipes) Yes, Yes, A prescription for an FDA-approved tobacco cessation medication was offered at discharge and the patient refused  Blood Alcohol level:  Lab Results  Component Value Date   [facility] 04/17/2016   ETH <5 03/23/2016    Metabolic Disorder Labs:  Lab  Results  Component Value Date   HGBA1C 6.5 (H) 04/06/2015   MPG 140 04/06/2015   MPG 97 04/27/2013   Lab Results  Component Value Date   PROLACTIN 18.2 (H) 04/06/2015   Lab Results  Component Value Date   CHOL 284 (H) 04/06/2015   TRIG 378 (H) 04/06/2015   HDL 43 04/06/2015   CHOLHDL 6.6 04/06/2015   VLDL 76 (H) 04/06/2015   LDLCALC 165 (H) 04/06/2015   LDLCALC 114 (H) 02/10/2015    See Psychiatric Specialty Exam and Suicide Risk Assessment completed by Attending Physician prior to discharge.  Discharge destination:  Home  Is patient on multiple antipsychotic therapies at discharge:  No   Has Patient had three or more failed trials of antipsychotic monotherapy by history:  No  Recommended Plan for Multiple Antipsychotic Therapies: NA     Medication List    TAKE these medications     Indication  ARIPiprazole 5 MG tablet Commonly known as:  ABILIFY Take 1 tablet (5 mg total) by mouth daily. Start taking on:  04/21/2016  Indication:  mood stabilization   FLUoxetine 40 MG capsule Commonly known as:  PROZAC Take 1 capsule (40 mg total) by mouth daily. Start taking on:  04/21/2016  Indication:  Depression   hydrOXYzine 50 MG tablet Commonly known as:  ATARAX/VISTARIL Take 1 tablet (50 mg total) by mouth 3 (three) times daily as needed for anxiety.  Indication:  Anxiety Neurosis   lisinopril 20 MG tablet Commonly known as:  PRINIVIL,ZESTRIL Take 1 tablet (20 mg total) by mouth daily. Start taking on:  04/21/2016  Indication:  High Blood Pressure Disorder   metFORMIN 500 MG tablet Commonly known as:  GLUCOPHAGE Take 1 tablet (500 mg total) by mouth daily with breakfast. Start taking on:  04/21/2016  Indication:  Type 2 Diabetes   nicotine 21 mg/24hr patch Commonly known as:  NICODERM CQ - dosed in mg/24 hours Place 1 patch (21 mg total) onto the skin daily. Start taking on:  04/21/2016  Indication:  Nicotine Addiction   pantoprazole 40 MG tablet Commonly  known as:  PROTONIX Take 1 tablet (40 mg total) by mouth daily. Start taking on:  04/21/2016  Indication:  Gastroesophageal Reflux Disease   traZODone 50 MG tablet Commonly known as:  DESYREL Take 1 tablet (50 mg total) by mouth at bedtime as needed for sleep.  Indication:  Trouble Sleeping        Follow-up recommendations:  Activity:  As tolerated Diet:  carb modified Other:  Abstain from substance and alcohol abuse  Comments:   Follow up with family Services for substance abuse treatment, medication management, and therapy  Follow up with PCP for Hypertension and Diabetes management  Take all medications as prescribed: Abilify 5 mg QD Prozac 40 mg QD Vistaril 50 mg TID Lisinopril 20 mg QD Metformin 500 mg QD with Breakfast Nicoderm CQ Patch 21 mg QD Protonix 40 mg QD Trazadone 50 mg QHS PRN    Signed: Laveda Abbe, NP 04/20/2016, 9:21 AM

## 2016-04-20 NOTE — Progress Notes (Signed)
This Clinical research associatewriter spoke to the patient about his plans for after discharge. The aptient plans to stay with his uncle upon discharge. He [facility]  Carmell Austriaisha Luan Maberry 04/20/2016

## 2016-05-17 ENCOUNTER — Emergency Department (HOSPITAL_COMMUNITY)
Admission: EM | Admit: 2016-05-17 | Discharge: 2016-05-18 | Disposition: A | Discharge status: Home / Self Care | Payer: No Typology Code available for payment source | Attending: Emergency Medicine | Admitting: Emergency Medicine

## 2016-05-17 ENCOUNTER — Encounter (HOSPITAL_COMMUNITY): Payer: Self-pay | Admitting: Nurse Practitioner

## 2016-05-17 DIAGNOSIS — F1414 Cocaine abuse with cocaine-induced mood disorder: Secondary | ICD-10-CM | POA: Diagnosis present

## 2016-05-17 DIAGNOSIS — Z79899 Other long term (current) drug therapy: Secondary | ICD-10-CM | POA: Insufficient documentation

## 2016-05-17 DIAGNOSIS — F1910 Other psychoactive substance abuse, uncomplicated: Secondary | ICD-10-CM

## 2016-05-17 DIAGNOSIS — Z7984 Long term (current) use of oral hypoglycemic drugs: Secondary | ICD-10-CM | POA: Insufficient documentation

## 2016-05-17 DIAGNOSIS — F1210 Cannabis abuse, uncomplicated: Secondary | ICD-10-CM | POA: Insufficient documentation

## 2016-05-17 DIAGNOSIS — I2510 Atherosclerotic heart disease of native coronary artery without angina pectoris: Secondary | ICD-10-CM | POA: Insufficient documentation

## 2016-05-17 DIAGNOSIS — Z9114 Patient's other noncompliance with medication regimen: Secondary | ICD-10-CM | POA: Insufficient documentation

## 2016-05-17 DIAGNOSIS — R45851 Suicidal ideations: Secondary | ICD-10-CM

## 2016-05-17 DIAGNOSIS — I10 Essential (primary) hypertension: Secondary | ICD-10-CM | POA: Insufficient documentation

## 2016-05-17 DIAGNOSIS — F17210 Nicotine dependence, cigarettes, uncomplicated: Secondary | ICD-10-CM | POA: Insufficient documentation

## 2016-05-17 DIAGNOSIS — Z951 Presence of aortocoronary bypass graft: Secondary | ICD-10-CM | POA: Insufficient documentation

## 2016-05-17 DIAGNOSIS — J449 Chronic obstructive pulmonary disease, unspecified: Secondary | ICD-10-CM | POA: Insufficient documentation

## 2016-05-17 LAB — CBC WITH DIFFERENTIAL/PLATELET
BASOS ABS: 0 10*3/uL (ref 0.0–0.1)
Basophils Relative: 0 %
Eosinophils Absolute: 0.3 10*3/uL (ref 0.0–0.7)
Eosinophils Relative: 4 %
HEMATOCRIT: 46.6 % (ref 39.0–52.0)
Hemoglobin: 15.6 g/dL (ref 13.0–17.0)
LYMPHS ABS: 1.2 10*3/uL (ref 0.7–4.0)
LYMPHS PCT: 19 %
MCH: 32.4 pg (ref 26.0–34.0)
MCHC: 33.5 g/dL (ref 30.0–36.0)
MCV: 96.9 fL (ref 78.0–100.0)
MONO ABS: 0.6 10*3/uL (ref 0.1–1.0)
Monocytes Relative: 9 %
NEUTROS ABS: 4.3 10*3/uL (ref 1.7–7.7)
Neutrophils Relative %: 68 %
PLATELETS: 254 10*3/uL (ref 150–400)
RBC: 4.81 MIL/uL (ref 4.22–5.81)
RDW: 14.6 % (ref 11.5–15.5)
WBC: 6.3 10*3/uL (ref 4.0–10.5)

## 2016-05-17 LAB — COMPREHENSIVE METABOLIC PANEL
ALBUMIN: 4.3 g/dL (ref 3.5–5.0)
ALT: 51 U/L (ref 17–63)
AST: 50 U/L — ABNORMAL HIGH (ref 15–41)
Alkaline Phosphatase: 64 U/L (ref 38–126)
Anion gap: 10 (ref 5–15)
BUN: 20 mg/dL (ref 6–20)
CHLORIDE: 102 mmol/L (ref 101–111)
CO2: 27 mmol/L (ref 22–32)
CREATININE: 1.02 mg/dL (ref 0.61–1.24)
Calcium: 10.1 mg/dL (ref 8.9–10.3)
GFR calc Af Amer: >60 mL/min (ref >60)
GFR calc non Af Amer: >60 mL/min (ref >60)
Glucose, Bld: 101 mg/dL — ABNORMAL HIGH (ref 65–99)
POTASSIUM: 4.2 mmol/L (ref 3.5–5.1)
SODIUM: 139 mmol/L (ref 135–145)
Total Bilirubin: 0.7 mg/dL (ref 0.3–1.2)
Total Protein: 7.2 g/dL (ref 6.5–8.1)

## 2016-05-17 LAB — RAPID URINE DRUG SCREEN, HOSP PERFORMED
Amphetamines: NOT DETECTED
BARBITURATES: NOT DETECTED
BENZODIAZEPINES: NOT DETECTED
Cocaine: POSITIVE — AB
Opiates: NOT DETECTED
Tetrahydrocannabinol: POSITIVE — AB

## 2016-05-17 LAB — ETHANOL

## 2016-05-17 MED ORDER — IBUPROFEN 800 MG PO TABS
800.0000 mg | ORAL_TABLET | Freq: Three times a day (TID) | ORAL | Status: DC | PRN
  Administered 2016-05-17 – 2016-05-18 (×3): 800 mg via ORAL
  Filled 2016-05-17 (×3): qty 1

## 2016-05-17 MED ORDER — TRAZODONE HCL 50 MG PO TABS
50.0000 mg | ORAL_TABLET | Freq: Every evening | ORAL | Status: DC | PRN
  Administered 2016-05-17: 50 mg via ORAL
  Filled 2016-05-17: qty 1

## 2016-05-17 MED ORDER — METFORMIN HCL 500 MG PO TABS
500.0000 mg | ORAL_TABLET | Freq: Every day | ORAL | Status: DC
  Administered 2016-05-18: 500 mg via ORAL
  Filled 2016-05-17: qty 1

## 2016-05-17 MED ORDER — PANTOPRAZOLE SODIUM 40 MG PO TBEC
40.0000 mg | DELAYED_RELEASE_TABLET | Freq: Every day | ORAL | Status: DC
  Administered 2016-05-17 – 2016-05-18 (×2): 40 mg via ORAL
  Filled 2016-05-17 (×2): qty 1

## 2016-05-17 MED ORDER — ACETAMINOPHEN 500 MG PO TABS
1000.0000 mg | ORAL_TABLET | Freq: Three times a day (TID) | ORAL | Status: DC | PRN
  Administered 2016-05-17: 1000 mg via ORAL
  Filled 2016-05-17: qty 2

## 2016-05-17 MED ORDER — NICOTINE 21 MG/24HR TD PT24
21.0000 mg | MEDICATED_PATCH | Freq: Once | TRANSDERMAL | Status: AC
  Administered 2016-05-17: 21 mg via TRANSDERMAL
  Filled 2016-05-17: qty 1

## 2016-05-17 MED ORDER — LISINOPRIL 20 MG PO TABS
20.0000 mg | ORAL_TABLET | Freq: Every day | ORAL | Status: DC
Start: 2016-05-17 — End: 2016-05-18
  Administered 2016-05-17 – 2016-05-18 (×2): 20 mg via ORAL
  Filled 2016-05-17 (×2): qty 1

## 2016-05-17 MED ORDER — FLUOXETINE HCL 20 MG PO CAPS
40.0000 mg | ORAL_CAPSULE | Freq: Every day | ORAL | Status: DC
  Administered 2016-05-17 – 2016-05-18 (×2): 40 mg via ORAL
  Filled 2016-05-17 (×2): qty 2

## 2016-05-17 MED ORDER — HYDROXYZINE HCL 25 MG PO TABS
50.0000 mg | ORAL_TABLET | Freq: Three times a day (TID) | ORAL | Status: DC | PRN
  Administered 2016-05-17: 50 mg via ORAL
  Filled 2016-05-17: qty 2

## 2016-05-17 MED ORDER — AMOXICILLIN-POT CLAVULANATE 875-125 MG PO TABS
1.0000 | ORAL_TABLET | Freq: Two times a day (BID) | ORAL | Status: DC
  Administered 2016-05-17 – 2016-05-18 (×2): 1 via ORAL
  Filled 2016-05-17 (×2): qty 1

## 2016-05-17 NOTE — BH Assessment (Signed)
Tele Assessment Note    Steven [name] is an 46 y.o. male. Pt reports SI with "multiple plans." Pt denies HI and AVH. Pt states he has been suicidal for "months." Pt reports daily drinking. Pt states he has drank 1 case of beer a day. Pt reports withdrawal symptoms and past seizures due to alcohol abuse. Pt states he uses marijuana and powder cocaine occasionally. Pt denies previous SI attempts. Pt has been hospitalized multiple times since 2016 for SA and SI. Pt's last hospitalization was 04/2016. Pt reports past inpatient and outpatient SA programs. Pt denies abuse. Pt denies current mental health medication. Pt states he resides with his uncle but they fight often. Pt reports a previous heart attack and 5 bypass surgeries.  Per Catha Nottingham, DNP. Pt meets inpatient criteria. TTS to seek placement.  Diagnosis:  F33.2 MDD, severe, recurrent  Past Medical History:  Past Medical History:  Diagnosis Date  . Asthma   . Bipolar disorder (manic depression) (HCC)   . COPD (chronic obstructive pulmonary disease) (HCC)   . Coronary artery disease   . Hx of CABG   . Hypercholesteremia   . Hypertension   . Kidney stone   . Tobacco use disorder     Past Surgical History:  Procedure Laterality Date  . ABDOMINAL ANGIOGRAM  08/15/2012   Procedure: ABDOMINAL ANGIOGRAM;  Surgeon: Corky Crafts, MD;  Location: [facility] CATH LAB;  Service: Cardiovascular;;  . APPENDECTOMY    . CARDIOVERSION N/A 08/28/2012   Procedure: Pseudo Compression;  Surgeon: Chuck Hint, MD;  Location: [facility] CATH LAB;  Service: Cardiovascular;  Laterality: N/A;  . CORONARY ARTERY BYPASS GRAFT    . CYSTOSCOPY WITH RETROGRADE PYELOGRAM, URETEROSCOPY AND STENT PLACEMENT Right 12/31/2014   Procedure: CYSTOSCOPY  RIGHT URETEROSCOPY WITH STONE RETREIVAL RIGHT RETROGRADE PYELOGRAM;  Surgeon: Malen Gauze, MD;  Location[facility];  Service: Urology;  Laterality: Right;  . KIDNEY STONE SURGERY    . LOWER EXTREMITY ANGIOGRAM N/A  08/15/2012   Procedure: LOWER EXTREMITY ANGIOGRAM with possible PTA /stent;  Surgeon: Corky Crafts, MD;  Location: [facility] CATH LAB;  Service: Cardiovascular;  Laterality: N/A;    Family History:  Family History  Problem Relation Age of Onset  . Heart disease Father   . Hyperlipidemia Father   . Heart disease Maternal Grandmother     Social History:  reports that he has been smoking Cigarettes.  He has a 31.00 pack-year smoking history. He quit smokeless tobacco use about 3 years ago. He reports that he drinks about 14.4 oz of alcohol per week . He reports that he uses drugs, including Cocaine and Marijuana, about 2 times per week.  Additional Social History:  Alcohol / Drug Use Pain Medications: Pt denies Prescriptions: Pt denies Over the Counter: Pt denies History of alcohol / drug use?: Yes Longest period of sobriety (when/how long): 18 months Negative Consequences of Use: Financial, Legal, Personal relationships, Work / School Withdrawal Symptoms: Agitation, Seizures, Irritability, Tingling, Cramps, Nausea / Vomiting, Fever / Chills Onset of Seizures: 2016 Date of most recent seizure: 10/2015 Substance #1 Name of Substance 1: alcohol 1 - Age of First Use: unknown 1 - Amount (size/oz): a case a day 1 - Frequency: daily 1 - Duration: ongoing 1 - Last Use / Amount: 05/16/2016 Substance #2 Name of Substance 2: marijuana 2 - Age of First Use: unknown 2 - Amount (size/oz): "very little" 2 - Frequency: occasional 2 - Duration: ongoing 2 - Last Use / Amount: 05/10/2017  Substance #3 Name of Substance 3: cocaine 3 - Age of First Use: unknown 3 - Amount (size/oz): " a couple of lines"  3 - Frequency: occasional 3 - Duration: ongoing 3 - Last Use / Amount: 04/30/17  CIWA: CIWA-Ar BP: 161/100 Pulse Rate: 94 COWS:    PATIENT STRENGTHS: (choose at least two) Average or above average intelligence Communication skills  Allergies: No Known Allergies  Home Medications:  (Not  in a hospital admission)  OB/GYN Status:  No LMP for male patient.  General Assessment Data [facility]: [facility] TTS Assessment: In system Is this a Tele or Face-to-Face Assessment?: Tele Assessment Is this an Initial Assessment or a Re-assessment for this encounter?: Initial Assessment Marital status: Divorced Oakwood[place]iden name: NA Is patient pregnant?: No Pregnancy Status: No Living Arrangements: Non-relatives/Friends Can pt return to current living arrangement?: No Admission Status: Voluntary Is patient capable of signing voluntary admission?: Yes Referral Source: Self/Family/Friend Insurance type: SP     Crisis Care Plan Living Arrangements: Non-relatives/Friends Legal Guardian: Other: (self) Name of Psychiatrist: NA Name of Therapist: NA  Education Status Is patient currently in school?: No Current Grade: NA Highest grade of school patient has completed: GED Name of school: [employer] Contact person: NA  Risk to self with the past 6 months Suicidal Ideation: Yes-Currently Present Has patient been a risk to self within the past 6 months prior to admission? : Yes Suicidal Intent: Yes-Currently Present Has patient had any suicidal intent within the past 6 months prior to admission? : Yes Is patient at risk for suicide?: Yes Suicidal Plan?: Yes-Currently Present Has patient had any suicidal plan within the past 6 months prior to admission? : Yes Specify Current Suicidal Plan: " I have a couple of routes" Access to Means: No Specify Access to Suicidal Means: unknown What has been your use of drugs/alcohol within the last 12 months?: alcohol, mariuana, and cocaine Previous Attempts/Gestures: No How many times?: 0 Other Self Harm Risks: NA Triggers for Past Attempts: Other (Comment) Intentional Self Injurious Behavior: None Comment - Self Injurious Behavior: SA Family Suicide History: No Recent stressful life event(s): Other (Comment) (SA) Persecutory voices/beliefs?:  No Depression: Yes Depression Symptoms: Despondent, Insomnia, Tearfulness, Isolating, Fatigue, Guilt, Loss of interest in usual pleasures, Feeling worthless/self pity, Feeling angry/irritable Substance abuse history and/or treatment for substance abuse?: Yes Suicide prevention information given to non-admitted patients: Not applicable  Risk to Others within the past 6 months Homicidal Ideation: No Does patient have any lifetime risk of violence toward others beyond the six months prior to admission? : No Thoughts of Harm to Others: No Current Homicidal Intent: No Current Homicidal Plan: No Access to Homicidal Means: No Describe Access to Homicidal Means: NA Identified Victim: NA History of harm to others?: No Assessment of Violence: None Noted Violent Behavior Description: NA Does patient have access to weapons?: No Criminal Charges Pending?: No Does patient have a court date: No Is patient on probation?: No  Psychosis Hallucinations: None noted Delusions: None noted  Mental Status Report Appearance/Hygiene: Unremarkable, In scrubs Eye Contact: Fair Motor Activity: Freedom of movement Speech: Logical/coherent Level of Consciousness: Alert Mood: Depressed, Sad Affect: Depressed, Sad Anxiety Level: Moderate Panic attack frequency: NA Most recent panic attack: NA Thought Processes: Coherent, Relevant Judgement: Unimpaired Orientation: Person, Place, Time, Situation, Appropriate for developmental age Obsessive Compulsive Thoughts/Behaviors: None  Cognitive Functioning Concentration: Normal Memory: Recent Intact, Remote Intact IQ: Average Insight: Fair Impulse Control: Fair Appetite: Fair Weight Loss: 0 Weight Gain: 0 Sleep:  Decreased Total Hours of Sleep: 5 Vegetative Symptoms: None  ADLScreening [facility] Assessment Services) Patient's cognitive ability adequate to safely complete daily activities?: Yes Patient able to express need for assistance with ADLs?:  Yes Independently performs ADLs?: Yes (appropriate for developmental age)  Prior Inpatient Therapy Prior Inpatient Therapy: Yes Prior Therapy Dates: multiple times Prior Therapy Facilty/Provider(s): [facility] Reason for Treatment: SI, SA  Prior Outpatient Therapy Prior Outpatient Therapy: Yes Prior Therapy Dates: 2014 Prior Therapy Facilty/Provider(s): [facility] Reason for Treatment: bipolar; substance abuse Does patient have an ACCT team?: No Does patient have Intensive In-House Services?  : No Does patient have [facility] services? : No Does patient have P4CC services?: No  ADL Screening (condition at time of admission) Patient's cognitive ability adequate to safely complete daily activities?: Yes Is the patient deaf or have difficulty hearing?: No Does the patient have difficulty seeing, even when wearing glasses/contacts?: No Does the patient have difficulty concentrating, remembering, or making decisions?: No Patient able to express need for assistance with ADLs?: Yes Does the patient have difficulty dressing or bathing?: No Independently performs ADLs?: Yes (appropriate for developmental age) Does the patient have difficulty walking or climbing stairs?: No Weakness of Legs: None Weakness of Arms/Hands: None       Abuse/Neglect Assessment (Assessment to be complete while patient is alone) Physical Abuse: Denies Verbal Abuse: Denies Sexual Abuse: Denies Exploitation of patient/patient's resources: Denies Self-Neglect: Denies     Merchant navy officer (For Healthcare) Does Patient Have a Medical Advance Directive?: No    Additional Information 1:1 In Past 12 Months?: No CIRT Risk: No Elopement Risk: No Does patient have medical clearance?: Yes     Disposition:  Disposition Initial Assessment Completed for this Encounter: Yes Disposition of Patient: Inpatient treatment program Type of inpatient treatment program: Adult  Laquan Ludden D 05/17/2016 1:19 PM

## 2016-05-17 NOTE — ED Provider Notes (Signed)
Blood pressure 161/100, pulse 94, temperature 97.5 F (36.4 C), temperature source Oral, resp. rate 16, height 5\' 10"  (1.778 m), weight 213 lb (96.6 kg), SpO2 96 %.  In short, Steven ChangRoger L [name] is a 46 y.o. male with a chief complaint of Suicidal .  Refer to the original H&P for additional details.  Called to evaluate patient with dental pain. He has global poor dentition. Some mild face swelling appreciated. No trismus. No obvious abscess. He will need outpatient dental evaluation. Tylenol/Motrin for pain and will start abx with some mild face swelling. Started Augmentin BID for 7 days.   Alona BeneJoshua Long, MD     Maia PlanJoshua G Long, MD 05/17/16 [phone]

## 2016-05-17 NOTE — Progress Notes (Signed)
05/18/15 1404:  LRT introduced self to pt and offered activities.  Pt stated he was not interested in any activities at this time.  Caroll RancherMarjette Elinore Shults, LRT/CTRS

## 2016-05-17 NOTE — ED Notes (Signed)
Pt c/o tooth pain 8/10; reports pain has been over the past couple of weeks. Jorene MinorsJameson Lord,NP notified.

## 2016-05-17 NOTE — ED Notes (Signed)
EDP at bedside  

## 2016-05-17 NOTE — ED Notes (Signed)
Pt sleeping at present, no distress noted, calm & cooperative.  Monitoring for safety, Q 15 min checks in effect. 

## 2016-05-17 NOTE — ED Notes (Signed)
Pt belongings placed in LOCKER 33

## 2016-05-17 NOTE — ED Notes (Signed)
Pt admitted to room #36. Sad affect, irritable at times.Pt endorsing SI. Denies HI. Denies AVH. Pt reports he is "tired of it" Pt reports he has been off his medication for two weeks d/t he is unable to afford the medication, reports finished the supply of medication that was given to him previous hospitalization. Encouragement and support provided. Special checks q 15 mins in place for safety. Video monitoring in place. Will continue to monitor.

## 2016-05-17 NOTE — ED Triage Notes (Signed)
Patient states he is having suicidal ideations this whole week. Patient has stopped all of his medication about 2-3 weeks ago due to financial reasons and has felt more mentally unstable and starting having thoughts of harming himself. He has no homicidal thoughts at this time. Patient states he smokes a pack per day, drinks ETOH 24 case a day, and does mariajuana and cocaine about twice a week.

## 2016-05-17 NOTE — ED Provider Notes (Signed)
[facility]-EM[facility] DEPT Provider Note   CSN: [id number] Arrival date & time: 05/17/16  0946     History   Chief Complaint Chief Complaint  Patient presents with  . Suicidal    HPI Steven [name] is a 46 y.o. male.  HPI Patient reports that he has run out of his medications due to financial problems. He reports he's become more distressed and had suicidal thoughts for the past week. He does not identify what plan he has.  Past Medical History:  Diagnosis Date  . Asthma   . Bipolar disorder (manic depression) (HCC)   . COPD (chronic obstructive pulmonary disease) (HCC)   . Coronary artery disease   . Hx of CABG   . Hypercholesteremia   . Hypertension   . Kidney stone   . Tobacco use disorder     Patient Active Problem List   Diagnosis Date Noted  . Polysubstance abuse 02/23/2016  . Opiate dependence (HCC) 02/23/2016  . Cocaine abuse with cocaine-induced mood disorder (HCC) 02/21/2016  . MDD (major depressive disorder), recurrent severe, without psychosis (HCC) 06/18/2015  . Substance induced mood disorder (HCC) 03/08/2015  . Suicidal ideation   . COPD (chronic obstructive pulmonary disease) (HCC) 02/09/2015  . Tobacco use disorder 02/09/2015  . Stimulant use disorder (cocaine) 02/09/2015  . Ureteral stone 12/31/2014  . Claudication of left lower extremity (HCC) 08/14/2012  . CAD s/p CABG 2012 [facility] 11/28/2011  . Hyperlipidemia LDL goal <100 03/13/2007  . Essential hypertension 03/13/2007  . GERD 03/13/2007    Past Surgical History:  Procedure Laterality Date  . ABDOMINAL ANGIOGRAM  08/15/2012   Procedure: ABDOMINAL ANGIOGRAM;  Surgeon: Corky CraftsJayadeep S Varanasi, MD;  [facility];  Service: Cardiovascular;;  . APPENDECTOMY    . CARDIOVERSION N/A 08/28/2012   Procedure: Pseudo Compression;  Surgeon: Chuck Hinthristopher S Dickson, MD;  [facility];  Service: Cardiovascular;  Laterality: N/A;  . CORONARY ARTERY BYPASS GRAFT    . CYSTOSCOPY WITH  RETROGRADE PYELOGRAM, URETEROSCOPY AND STENT PLACEMENT Right 12/31/2014   Procedure: CYSTOSCOPY  RIGHT URETEROSCOPY WITH STONE RETREIVAL RIGHT RETROGRADE PYELOGRAM;  Surgeon: Malen GauzePatrick L McKenzie, MD;  Location: [facility] ORS;  Service: Urology;  Laterality: Right;  . KIDNEY STONE SURGERY    . LOWER EXTREMITY ANGIOGRAM N/A 08/15/2012   Procedure: LOWER EXTREMITY ANGIOGRAM with possible PTA /stent;  Surgeon: Corky CraftsJayadeep S Varanasi, MD;  [facility];  Service: Cardiovascular;  Laterality: N/A;       Home Medications    Prior to Admission medications   Medication Sig Start Date End Date Taking? Authorizing Provider  hydrOXYzine (ATARAX/VISTARIL) 50 MG tablet Take 1 tablet (50 mg total) by mouth 3 (three) times daily as needed for anxiety. 04/20/16  Yes Laveda AbbeLaurie Britton Parks, NP  lisinopril (PRINIVIL,ZESTRIL) 20 MG tablet Take 1 tablet (20 mg total) by mouth daily. 04/21/16  Yes Laveda AbbeLaurie Britton Parks, NP  metFORMIN (GLUCOPHAGE) 500 MG tablet Take 1 tablet (500 mg total) by mouth daily with breakfast. 04/21/16  Yes Laveda AbbeLaurie Britton Parks, NP  nicotine (NICODERM CQ - DOSED IN MG/24 HOURS) 21 mg/24hr patch Place 1 patch (21 mg total) onto the skin daily. 04/21/16  Yes Laveda AbbeLaurie Britton Parks, NP  pantoprazole (PROTONIX) 40 MG tablet Take 1 tablet (40 mg total) by mouth daily. 04/21/16  Yes Laveda AbbeLaurie Britton Parks, NP  traZODone (DESYREL) 50 MG tablet Take 1 tablet (50 mg total) by mouth at bedtime as needed for sleep. 04/20/16  Yes Laveda AbbeLaurie Britton Parks, NP  amoxicillin-clavulanate (AUGMENTIN) 875-125 MG tablet Take 1 tablet by mouth every 12 (twelve) hours. 05/18/16   Charm Rings, NP  ARIPiprazole (ABILIFY) 5 MG tablet Take 1 tablet (5 mg total) by mouth daily. 05/18/16   Charm Rings, NP  FLUoxetine (PROZAC) 40 MG capsule Take 1 capsule (40 mg total) by mouth daily. 05/18/16   Charm Rings, NP    Family History Family History  Problem Relation Age of Onset  . Heart disease Father   . Hyperlipidemia Father   .  Heart disease Maternal Grandmother     Social History Social History  Substance Use Topics  . Smoking status: Current Every Day Smoker    Packs/day: 1.00    Years: 31.00    Types: Cigarettes  . Smokeless tobacco: Former Neurosurgeon    Quit date: 04/27/2013  . Alcohol use 14.4 oz/week    24 Cans of beer per week     Comment: 1 case of beer a day     Allergies   Patient has no known allergies.   Review of Systems Review of Systems  10 Systems reviewed and are negative for acute change except as noted in the HPI.  Physical Exam Updated Vital Signs BP 151/79 (BP Location: Left Arm)   Pulse 91   Temp 97.9 F (36.6 C) (Oral)   Resp 18   Ht 5\' 10"  (1.778 m)   Wt 213 lb (96.6 kg)   SpO2 96%   BMI 30.56 kg/m   Physical Exam  Constitutional: He is oriented to person, place, and time. He appears well-developed and well-nourished.  HENT:  Head: Normocephalic and atraumatic.  Nose: Nose normal.  Mouth/Throat: Oropharynx is clear and moist.  Has very poor dentition with many teeth severely decayed. No focal area of facial swelling or evident drainage. Neck is supple.  Eyes: Conjunctivae and EOM are normal. Pupils are equal, round, and reactive to light.  Neck: Neck supple.  Cardiovascular: Normal rate, regular rhythm and intact distal pulses.   No murmur heard. Pulmonary/Chest: Effort normal and breath sounds normal. No respiratory distress. He has no wheezes.  Abdominal: Soft. He exhibits no distension. There is no tenderness. There is no guarding.  Musculoskeletal: Normal range of motion. He exhibits no edema.  Neurological: He is alert and oriented to person, place, and time. No cranial nerve deficit. He exhibits normal muscle tone. Coordination normal.  Skin: Skin is warm and dry.  Psychiatric:  Affect is slightly flat the patient is cooperative and giving full history.  Nursing note and vitals reviewed.    ED Treatments / Results  Labs (all labs ordered are listed, but  only abnormal results are displayed) Labs Reviewed  COMPREHENSIVE METABOLIC PANEL - Abnormal; Notable for the following:       Result Value   Glucose, Bld 101 (*)    AST 50 (*)    All other components within normal limits  RAPID URINE DRUG SCREEN, HOSP PERFORMED - Abnormal; Notable for the following:    Cocaine POSITIVE (*)    Tetrahydrocannabinol POSITIVE (*)    All other components within normal limits  ETHANOL  CBC WITH DIFFERENTIAL/PLATELET    EKG  EKG Interpretation None       Radiology No results found.  Procedures Procedures (including critical care time)  Medications Ordered in ED Medications  nicotine (NICODERM CQ - dosed in mg/24 hours) patch 21 mg (21 mg Transdermal Patch Applied 05/17/16 1321)     Initial Impression / Assessment and  Plan / ED Course  I have reviewed the triage vital signs and the nursing notes.  Pertinent labs & imaging results that were available during my care of the patient were reviewed by me and considered in my medical decision making (see chart for details).  Clinical Course     Final Clinical Impressions(s) / ED Diagnoses   Final diagnoses:  Suicidal ideation  Polysubstance abuse  History of medication noncompliance  Cocaine abuse with cocaine-induced mood disorder (HCC)   Patient reports suicidal ideation. He has been noncompliant with medications. Clinically he is alert and well appearance. He has comorbid medical illnesses but these are stable at this time. New Prescriptions Discharge Medication List as of 05/18/2016 10:38 AM    START taking these medications   Details  amoxicillin-clavulanate (AUGMENTIN) 875-125 MG tablet Take 1 tablet by mouth every 12 (twelve) hours., Starting Thu 05/18/2016, Normal         Arby Barrette, MD 05/19/16 [phone]

## 2016-05-17 NOTE — ED Notes (Signed)
Waiting on meds from pharmacy 

## 2016-05-17 NOTE — ED Notes (Signed)
Pt continues to complain of tooth pain, this nurse notified EDP, reports he will come to unit.

## 2016-05-18 DIAGNOSIS — F17210 Nicotine dependence, cigarettes, uncomplicated: Secondary | ICD-10-CM

## 2016-05-18 DIAGNOSIS — Z8249 Family history of ischemic heart disease and other diseases of the circulatory system: Secondary | ICD-10-CM

## 2016-05-18 DIAGNOSIS — F1414 Cocaine abuse with cocaine-induced mood disorder: Secondary | ICD-10-CM | POA: Diagnosis not present

## 2016-05-18 DIAGNOSIS — F1910 Other psychoactive substance abuse, uncomplicated: Secondary | ICD-10-CM

## 2016-05-18 DIAGNOSIS — F1120 Opioid dependence, uncomplicated: Secondary | ICD-10-CM

## 2016-05-18 DIAGNOSIS — Z9889 Other specified postprocedural states: Secondary | ICD-10-CM

## 2016-05-18 DIAGNOSIS — Z79899 Other long term (current) drug therapy: Secondary | ICD-10-CM

## 2016-05-18 MED ORDER — FLUOXETINE HCL 40 MG PO CAPS: 40.0000 mg | ORAL_CAPSULE | Freq: Every day | ORAL | 1 refills | Status: DC

## 2016-05-18 MED ORDER — ARIPIPRAZOLE 5 MG PO TABS: 5.0000 mg | ORAL_TABLET | Freq: Every day | ORAL | 0 refills | Status: DC

## 2016-05-18 MED ORDER — FLUOXETINE HCL 40 MG PO CAPS: 40.0000 mg | ORAL_CAPSULE | Freq: Every day | ORAL | 0 refills | Status: DC

## 2016-05-18 MED ORDER — AMOXICILLIN-POT CLAVULANATE 875-125 MG PO TABS: 1.0000 | ORAL_TABLET | Freq: Two times a day (BID) | ORAL | 0 refills | Status: DC

## 2016-05-18 MED ORDER — ARIPIPRAZOLE 5 MG PO TABS
5.0000 mg | ORAL_TABLET | Freq: Every day | ORAL | 1 refills | Status: DC
Start: 2016-05-18 — End: 2017-12-06

## 2016-05-18 NOTE — Consult Note (Signed)
[facility] Consult   Reason for Consult:  Cocaine abuse with suicidal ideations Referring Physician:  EDP Patient Identification: Steven [name] MRN:  [id number] Principal Diagnosis: Cocaine abuse with cocaine-induced mood disorder [facility]) Diagnosis:   Patient Active Problem List   Diagnosis Date Noted  . Cocaine abuse with cocaine-induced mood disorder ([place]) [F14.14] 02/21/2016    Priority: High  . Polysubstance abuse [F19.10] 02/23/2016  . Opiate dependence ([place]) [F11.20] 02/23/2016  . MDD (major depressive disorder), recurrent severe, without psychosis ([place]) [F33.2] 06/18/2015  . Substance induced mood disorder ([place]) [F19.94] 03/08/2015  . Suicidal ideation [R45.851]   . COPD (chronic obstructive pulmonary disease) ([place]) [J44.9] 02/09/2015  . Tobacco use disorder [F17.200] 02/09/2015  . Stimulant use disorder (cocaine) [F15.90] 02/09/2015  . Ureteral stone [N20.1] 12/31/2014  . Claudication of left lower extremity ([place]) [I73.9] 08/14/2012  . CAD s/p CABG 2012 [facility] [I25.810] 11/28/2011  . Hyperlipidemia LDL goal <100 [E78.5] 03/13/2007  . Essential hypertension [I10] 03/13/2007  . GERD [K21.9] 03/13/2007    Total Time spent with patient: 45 minutes  Subjective:   Steven [name] is a 46 y.o. male patient reports, "Just need my Prozac."  HPI:  46 yo male who presented to the ED after abusing cocaine and having suicidal ideations.  He is well known to these providers and ED for similar admissions with the same symptoms.  Steven [name] lives with his uncle and when he uses cocaine his uncle gets upset and wants him out.  Today, he is clear and coherent with no suicidal/homicidal ideations, hallucinations, or withdrawal symptoms.  He would like to get restarted on Prozac for his depression, low level.  Stable for discharge.  Past Psychiatric History: substance abuse, cocaine  Risk to Self: None Risk to Others: Homicidal Ideation: No Thoughts of Harm to Others:  No Current Homicidal Intent: No Current Homicidal Plan: No Access to Homicidal Means: No Describe Access to Homicidal Means: NA Identified Victim: NA History of harm to others?: No Assessment of Violence: None Noted Violent Behavior Description: NA Does patient have access to weapons?: No Criminal Charges Pending?: No Does patient have a court date: No Prior Inpatient Therapy: Prior Inpatient Therapy: Yes Prior Therapy Dates: multiple times Prior Therapy Facilty/Provider(s): [facility] Reason for Treatment: SI, SA Prior Outpatient Therapy: Prior Outpatient Therapy: Yes Prior Therapy Dates: 2014 Prior Therapy Facilty/Provider(s): [facility] Reason for Treatment: bipolar; substance abuse Does patient have an ACCT team?: No Does patient have Intensive In-House Services?  : No Does patient have [facility] services? : No Does patient have [facility] services?: No  Past Medical History:  Past Medical History:  Diagnosis Date  . Asthma   . Bipolar disorder (manic depression) ([place])   . COPD (chronic obstructive pulmonary disease) ([place])   . Coronary artery disease   . Hx of CABG   . Hypercholesteremia   . Hypertension   . Kidney stone   . Tobacco use disorder     Past Surgical History:  Procedure Laterality Date  . ABDOMINAL ANGIOGRAM  08/15/2012   Procedure: ABDOMINAL ANGIOGRAM;  Surgeon: Jettie Booze, MD;  Location: [facility] CATH LAB;  Service: Cardiovascular;;  . APPENDECTOMY    . CARDIOVERSION N/A 08/28/2012   Procedure: Pseudo Compression;  Surgeon: Angelia Mould, MD;  Location: [facility] CATH LAB;  Service: Cardiovascular;  Laterality: N/A;  . CORONARY ARTERY BYPASS GRAFT    . CYSTOSCOPY WITH RETROGRADE PYELOGRAM, URETEROSCOPY AND STENT PLACEMENT Right 12/31/2014   Procedure: CYSTOSCOPY  RIGHT URETEROSCOPY WITH STONE RETREIVAL RIGHT RETROGRADE PYELOGRAM;  Surgeon: Cleon Gustin, MD;  Location: [facility]S;  Service: Urology;  Laterality: Right;  . KIDNEY STONE SURGERY    . LOWER  EXTREMITY ANGIOGRAM N/A 08/15/2012   Procedure: LOWER EXTREMITY ANGIOGRAM with possible PTA /stent;  Surgeon: Jettie Booze, MD;  Location: [facility] CATH LAB;  Service: Cardiovascular;  Laterality: N/A;   Family History:  Family History  Problem Relation Age of Onset  . Heart disease Father   . Hyperlipidemia Father   . Heart disease Maternal Grandmother    Family Psychiatric  History: none Social History:  History  Alcohol Use  . 14.4 oz/week  . 24 Cans of beer per week    Comment: 1 case of beer a day     History  Drug Use  . Frequency: 2.0 times per week  . Types: Cocaine, Marijuana    Social History   Social History  . Marital status: Divorced    Spouse name: N/A  . Number of children: N/A  . Years of education: N/A   Social History Main Topics  . Smoking status: Current Every Day Smoker    Packs/day: 1.00    Years: 31.00    Types: Cigarettes  . Smokeless tobacco: Former Systems developer    Quit date: 04/27/2013  . Alcohol use 14.4 oz/week    24 Cans of beer per week     Comment: 1 case of beer a day  . Drug use:     Frequency: 2.0 times per week    Types: Cocaine, Marijuana  . Sexual activity: Yes    Birth control/ protection: None   Other Topics Concern  . None   Social History Narrative  . None   Additional Social History:    Allergies:  No Known Allergies  Labs:  Results for orders placed or performed during the hospital encounter of 05/17/16 (from the past 48 hour(s))  Comprehensive metabolic panel     Status: Abnormal   Collection Time: 05/17/16 12:08 PM  Result Value Ref Range   Sodium 139 135 - 145 mmol/L   Potassium 4.2 3.5 - 5.1 mmol/L   Chloride 102 101 - 111 mmol/L   CO2 27 22 - 32 mmol/L   Glucose, Bld 101 (H) 65 - 99 mg/dL   BUN 20 6 - 20 mg/dL   Creatinine, Ser 1.02 0.61 - 1.24 mg/dL   Calcium 10.1 8.9 - 10.3 mg/dL   Total Protein 7.2 6.5 - 8.1 g/dL   Albumin 4.3 3.5 - 5.0 g/dL   AST 50 (H) 15 - 41 U/L   ALT 51 17 - 63 U/L   Alkaline  Phosphatase 64 38 - 126 U/L   Total Bilirubin 0.7 0.3 - 1.2 mg/dL   GFR calc non Af Amer >60 >60 mL/min   GFR calc Af Amer >60 >60 mL/min    Comment: (NOTE) The eGFR has been calculated using the CKD EPI equation. This calculation has not been validated in all clinical situations. eGFR's persistently <60 mL/min signify possible Chronic Kidney Disease.    Anion gap 10 5 - 15  Ethanol     Status: None   Collection Time: 05/17/16 12:08 PM  Result Value Ref Range   Alcohol, Ethyl (B) <5 <5 mg/dL    Comment:        LOWEST DETECTABLE LIMIT FOR SERUM ALCOHOL IS 5 mg/dL FOR MEDICAL PURPOSES ONLY   CBC with Diff     Status: None  Collection Time: 05/17/16 12:08 PM  Result Value Ref Range   WBC 6.3 4.0 - 10.5 K/uL   RBC 4.81 4.22 - 5.81 MIL/uL   Hemoglobin 15.6 13.0 - 17.0 g/dL   HCT 31.0 33.7 - 52.6 %   MCV 96.9 78.0 - 100.0 fL   MCH 32.4 26.0 - 34.0 pg   MCHC 33.5 30.0 - 36.0 g/dL   RDW 16.1 19.6 - 38.1 %   Platelets 254 150 - 400 K/uL   Neutrophils Relative % 68 %   Neutro Abs 4.3 1.7 - 7.7 K/uL   Lymphocytes Relative 19 %   Lymphs Abs 1.2 0.7 - 4.0 K/uL   Monocytes Relative 9 %   Monocytes Absolute 0.6 0.1 - 1.0 K/uL   Eosinophils Relative 4 %   Eosinophils Absolute 0.3 0.0 - 0.7 K/uL   Basophils Relative 0 %   Basophils Absolute 0.0 0.0 - 0.1 K/uL  Urine rapid drug screen (hosp performed)not at [facility]     Status: Abnormal   Collection Time: 05/17/16 12:52 PM  Result Value Ref Range   Opiates NONE DETECTED NONE DETECTED   Cocaine POSITIVE (A) NONE DETECTED   Benzodiazepines NONE DETECTED NONE DETECTED   Amphetamines NONE DETECTED NONE DETECTED   Tetrahydrocannabinol POSITIVE (A) NONE DETECTED   Barbiturates NONE DETECTED NONE DETECTED    Comment:        DRUG SCREEN FOR MEDICAL PURPOSES ONLY.  IF CONFIRMATION IS NEEDED FOR ANY PURPOSE, NOTIFY LAB WITHIN 5 DAYS.        LOWEST DETECTABLE LIMITS FOR URINE DRUG SCREEN Drug Class       Cutoff (ng/mL) Amphetamine       1000 Barbiturate      200 Benzodiazepine   200 Tricyclics       300 Opiates          300 Cocaine          300 THC              50     Current Facility-Administered Medications  Medication Dose Route Frequency Provider Last Rate Last Dose  . acetaminophen (TYLENOL) tablet 1,000 mg  1,000 mg Oral Q8H PRN Maia Plan, MD   1,000 mg at 05/17/16 1754  . amoxicillin-clavulanate (AUGMENTIN) 875-125 MG per tablet 1 tablet  1 tablet Oral Q12H Maia Plan, MD   1 tablet at 05/18/16 0914  . FLUoxetine (PROZAC) capsule 40 mg  40 mg Oral Daily Arby Barrette, MD   40 mg at 05/18/16 0914  . hydrOXYzine (ATARAX/VISTARIL) tablet 50 mg  50 mg Oral TID PRN Arby Barrette, MD   50 mg at 05/17/16 2135  . ibuprofen (ADVIL,MOTRIN) tablet 800 mg  800 mg Oral TID PRN Charm Rings, NP   800 mg at 05/18/16 0524  . lisinopril (PRINIVIL,ZESTRIL) tablet 20 mg  20 mg Oral Daily Arby Barrette, MD   20 mg at 05/18/16 0914  . metFORMIN (GLUCOPHAGE) tablet 500 mg  500 mg Oral Q breakfast Arby Barrette, MD   500 mg at 05/18/16 0817  . nicotine (NICODERM CQ - dosed in mg/24 hours) patch 21 mg  21 mg Transdermal Once Arby Barrette, MD   21 mg at 05/17/16 1321  . pantoprazole (PROTONIX) EC tablet 40 mg  40 mg Oral Daily Arby Barrette, MD   40 mg at 05/18/16 0914  . traZODone (DESYREL) tablet 50 mg  50 mg Oral QHS PRN Arby Barrette, MD   50 mg at 05/17/16 2138  Current Outpatient Prescriptions  Medication Sig Dispense Refill  . ARIPiprazole (ABILIFY) 5 MG tablet Take 1 tablet (5 mg total) by mouth daily. 14 tablet 0  . FLUoxetine (PROZAC) 40 MG capsule Take 1 capsule (40 mg total) by mouth daily. 14 capsule 0  . hydrOXYzine (ATARAX/VISTARIL) 50 MG tablet Take 1 tablet (50 mg total) by mouth 3 (three) times daily as needed for anxiety. 30 tablet 0  . lisinopril (PRINIVIL,ZESTRIL) 20 MG tablet Take 1 tablet (20 mg total) by mouth daily. 14 tablet 0  . metFORMIN (GLUCOPHAGE) 500 MG tablet Take 1 tablet (500 mg total)  by mouth daily with breakfast. 14 tablet 0  . nicotine (NICODERM CQ - DOSED IN MG/24 HOURS) 21 mg/24hr patch Place 1 patch (21 mg total) onto the skin daily. 28 patch 0  . pantoprazole (PROTONIX) 40 MG tablet Take 1 tablet (40 mg total) by mouth daily. 14 tablet 0  . traZODone (DESYREL) 50 MG tablet Take 1 tablet (50 mg total) by mouth at bedtime as needed for sleep. 14 tablet 0    Musculoskeletal: Strength & Muscle Tone: within normal limits Gait & Station: normal Patient leans: N/A  Psychiatric Specialty Exam: Physical Exam  Constitutional: He is oriented to person, place, and time. He appears well-developed and well-nourished.  HENT:  Head: Normocephalic.  Neck: Normal range of motion.  Respiratory: Effort normal.  Musculoskeletal: Normal range of motion.  Neurological: He is alert and oriented to person, place, and time.  Psychiatric: His speech is normal and behavior is normal. Judgment and thought content normal. Cognition and memory are normal. He exhibits a depressed mood.    Review of Systems  Psychiatric/Behavioral: Positive for depression and substance abuse.  All other systems reviewed and are negative.   Blood pressure 157/96, pulse 91, temperature 97.6 F (36.4 C), temperature source Oral, resp. rate 16, height '5\' 10"'$  (1.778 m), weight 96.6 kg (213 lb), SpO2 97 %.Body mass index is 30.56 kg/m.  General Appearance: Casual  Eye Contact:  Good  Speech:  Normal Rate  Volume:  Normal  Mood:  Depressed, mild  Affect:  Congruent  Thought Process:  Coherent and Descriptions of Associations: Intact  Orientation:  Full (Time, Place, and Person)  Thought Content:  WDL  Suicidal Thoughts:  No  Homicidal Thoughts:  No  Memory:  Immediate;   Good Recent;   Good Remote;   Good  Judgement:  Fair  Insight:  Fair  Psychomotor Activity:  Normal  Concentration:  Concentration: Good and Attention Span: Good  Recall:  Good  Fund of Knowledge:  Fair  Language:  Good   Akathisia:  No  Handed:  Right  AIMS (if indicated):     Assets:  Housing Leisure Time Physical Health Resilience Social Support  ADL's:  Intact  Cognition:  WNL  Sleep:        Treatment Plan Summary: Daily contact with patient to assess and evaluate symptoms and progress in treatment, Medication management and Plan coaine abuse with cocaine induced mood disorder:  -Crisis stabilization -Medication management:  Restarted Prozac 40 mg daily for depression along with Ability 5 mg daily for mood stabilization -Individual and substance abuse counseling  Disposition: No evidence of imminent risk to self or others at present.    Waylan Boga, NP 05/18/2016 9:59 AM  Patient seen face-to-face for psychiatric evaluation, chart reviewed and case discussed with the physician extender and developed treatment plan. Reviewed the information documented and agree with the treatment plan. Jovon Streetman,  MD 

## 2016-05-18 NOTE — ED Notes (Signed)
Pt d/c home per MD order. Pt not endorsing SI/HI/AVH at discharge. This nurse reviewed discharge summary with pt. Pt verbalizes understanding of discharge summary and outpatient resources. RX provided. Personal property returned to pt. Pt signed e-signature. Ambulatory off unit with [facility].

## 2016-05-18 NOTE — Discharge Instructions (Signed)
For your ongoing mental health needs, you are advised to follow up with [facility].  New and returning patients are seen at their walk-in clinic.  Walk-in hours are Monday - Friday from 8:00 am - 3:00 pm.  Walk-in patients are seen on a first come, first served basis.  Try to arrive as early as possible for he best chance of being seen the same day:       [facility]      [address]      [place], [place] [address]      [phone]  To help you maintain a sober lifestyle, a substance abuse treatment program may be beneficial to you.  Contact one of the following facilities at your earliest opportunity to ask about enrolling:  RESIDENTIAL PROGRAMS:       [facility]      [address]      [place], [place] [address]      [phone]       [facility]      [address]      [place], [place] [address]      [phone]       [facility]      [address]      [place], [place] [address]      [phone]  OUTPATIENT PROGRAMS:       [facility]      [address]      [place], [place] [address]      [phone]      New patients are seen at the walk-in clinic every Tuesday from 9:00 am - 12:00 pm.

## 2016-05-18 NOTE — BH Assessment (Signed)
[facility] Assessment Progress Note  Per Thedore MinsMojeed Akintayo, MD, this pt does not require psychiatric hospitalization at this time.  [facility] and for area substance abuse treatment providers.  This has been included in pt's discharge instructions.  Pt's nurse, Morrie Sheldonshley, has been notified.  Steven Canninghomas Elisea Khader, MA Triage Specialist [phone]

## 2016-05-18 NOTE — [facility] Suicide Risk Assessment (Signed)
Suicide Risk Assessment  Discharge Assessment   [facility] Discharge Suicide Risk Assessment   Principal Problem: Cocaine abuse with cocaine-induced mood disorder [facility]) Discharge Diagnoses:  Patient Active Problem List   Diagnosis Date Noted  . Cocaine abuse with cocaine-induced mood disorder (HCC) [F14.14] 02/21/2016    Priority: High  . Polysubstance abuse [F19.10] 02/23/2016  . Opiate dependence (HCC) [F11.20] 02/23/2016  . MDD (major depressive disorder), recurrent severe, without psychosis (HCC) [F33.2] 06/18/2015  . Substance induced mood disorder (HCC) [F19.94] 03/08/2015  . Suicidal ideation [R45.851]   . COPD (chronic obstructive pulmonary disease) (HCC) [J44.9] 02/09/2015  . Tobacco use disorder [F17.200] 02/09/2015  . Stimulant use disorder (cocaine) [F15.90] 02/09/2015  . Ureteral stone [N20.1] 12/31/2014  . 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 CAD s/p CABG 2012 [facility] [I25.810] 11/28/2011  . Hyperlipidemia LDL goal <100 [E78.5] 03/13/2007  . Essential hypertension [I10] 03/13/2007  . GERD [K21.9] 03/13/2007    Total Time spent with patient: 45 minutes  Musculoskeletal: Strength & Muscle Tone: within normal limits Gait & Station: normal Patient leans: N/A  Psychiatric Specialty Exam: Physical Exam  Constitutional: He is oriented to person, place, and time. He appears well-developed and well-nourished.  HENT:  Head: Normocephalic.  Neck: Normal range of motion.  Respiratory: Effort normal.  Musculoskeletal: Normal range of motion.  Neurological: He is alert and oriented to person, place, and time.  Psychiatric: His speech is normal and behavior is normal. Judgment and thought content normal. Cognition and memory are normal. He exhibits a depressed mood.    Review of Systems  Psychiatric/Behavioral: Positive for depression and substance abuse.  All other systems reviewed and are negative.   Blood pressure 157/96, pulse 91,  temperature 97.6 F (36.4 C), temperature source Oral, resp. rate 16, height 5\' 10"  (1.778 m), weight 96.6 kg (213 lb), SpO2 97 %.Body mass index is 30.56 kg/m.  General Appearance: Casual  Eye Contact:  Good  Speech:  Normal Rate  Volume:  Normal  Mood:  Depressed, mild  Affect:  Congruent  Thought Process:  Coherent and Descriptions of Associations: Intact  Orientation:  Full (Time, Place, and Person)  Thought Content:  WDL  Suicidal Thoughts:  No  Homicidal Thoughts:  No  Memory:  Immediate;   Good Recent;   Good Remote;   Good  Judgement:  Fair  Insight:  Fair  Psychomotor Activity:  Normal  Concentration:  Concentration: Good and Attention Span: Good  Recall:  Good  Fund of Knowledge:  Fair  Language:  Good  Akathisia:  No  Handed:  Right  AIMS (if indicated):     Assets:  Housing Leisure Time Physical Health Resilience Social Support  ADL's:  Intact  Cognition:  WNL  Sleep:       Mental Status Per Nursing Assessment::   On Admission:   cocaine abuse with suicidal ideations  Demographic Factors:  Male and Caucasian  Loss Factors: NA  Historical Factors: NA  Risk Reduction Factors:   Sense of responsibility to family, Living with another person, especially a relative and Positive social support  Continued Clinical Symptoms:  Depression, mild  Cognitive Features That Contribute To Risk:  None    Suicide Risk:  Minimal: No identifiable suicidal ideation.  Patients presenting with no risk factors but with morbid ruminations; may be classified as minimal risk based on the severity of the depressive symptoms    Plan Of Care/Follow-up recommendations:  Activity:  as tolerated Diet:  heart healthy diet  Santanna Whitford, NP 05/18/2016, 10:05 AM

## 2016-07-02 ENCOUNTER — Emergency Department (HOSPITAL_COMMUNITY)
Admission: EM | Admit: 2016-07-02 | Discharge: 2016-07-02 | Disposition: A | Discharge status: Home / Self Care | Payer: Self-pay | Attending: Emergency Medicine | Admitting: Emergency Medicine

## 2016-07-02 ENCOUNTER — Encounter (HOSPITAL_COMMUNITY): Payer: Self-pay | Admitting: Oncology

## 2016-07-02 DIAGNOSIS — F10920 Alcohol use, unspecified with intoxication, uncomplicated: Secondary | ICD-10-CM

## 2016-07-02 DIAGNOSIS — J449 Chronic obstructive pulmonary disease, unspecified: Secondary | ICD-10-CM | POA: Insufficient documentation

## 2016-07-02 DIAGNOSIS — F10129 Alcohol abuse with intoxication, unspecified: Secondary | ICD-10-CM | POA: Insufficient documentation

## 2016-07-02 DIAGNOSIS — F17210 Nicotine dependence, cigarettes, uncomplicated: Secondary | ICD-10-CM | POA: Insufficient documentation

## 2016-07-02 DIAGNOSIS — Z79899 Other long term (current) drug therapy: Secondary | ICD-10-CM | POA: Insufficient documentation

## 2016-07-02 DIAGNOSIS — I10 Essential (primary) hypertension: Secondary | ICD-10-CM | POA: Insufficient documentation

## 2016-07-02 DIAGNOSIS — Z7984 Long term (current) use of oral hypoglycemic drugs: Secondary | ICD-10-CM | POA: Insufficient documentation

## 2016-07-02 DIAGNOSIS — I2510 Atherosclerotic heart disease of native coronary artery without angina pectoris: Secondary | ICD-10-CM | POA: Insufficient documentation

## 2016-07-02 DIAGNOSIS — Z951 Presence of aortocoronary bypass graft: Secondary | ICD-10-CM | POA: Insufficient documentation

## 2016-07-02 MED ORDER — IPRATROPIUM-ALBUTEROL 0.5-2.5 (3) MG/3ML IN SOLN
3.0000 mL | Freq: Once | RESPIRATORY_TRACT | Status: AC
  Administered 2016-07-02: 3 mL via RESPIRATORY_TRACT
  Filled 2016-07-02: qty 3

## 2016-07-02 MED ORDER — CHLORDIAZEPOXIDE HCL 25 MG PO CAPS: ORAL_CAPSULE | ORAL | 0 refills | Status: DC

## 2016-07-02 MED ORDER — FAMOTIDINE 20 MG PO TABS
40.0000 mg | ORAL_TABLET | Freq: Once | ORAL | Status: AC
  Administered 2016-07-02: 40 mg via ORAL
  Filled 2016-07-02: qty 2

## 2016-07-02 NOTE — ED Notes (Signed)
Pt. REFUSED to be discharged , told Nurse Tech and this Nurse that he was not seen by a doctor yet. Pt. Walked out of dept. Upset ,refused to sign discharge and refused to accept discharge papers and prescription. MD / PA notified.

## 2016-07-02 NOTE — ED Triage Notes (Signed)
Pt states he is going to [facility] on Tuesday and would like to be watched until then.  Pt's last drink was approximately 2 hours ago.  Reports drinking a case of beer today.  Pt states that when he has detox in the past he has had seizures.

## 2016-07-02 NOTE — ED Notes (Addendum)
Pt states he is going to go to [facility] Tuesday for alcohol detoxification.  He states he has been 18 months sober. Denies SI/HI. C/o indigestion, "feels like my belly is on fire."  Admits to chest discomfort for 5 months.
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
## 2016-07-02 NOTE — Discharge Instructions (Signed)
Please follow up with [facility] to help with detox Start Librium tomorrow to help with withdrawal symptoms - do not drink alcohol or drive while taking this medicine
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
## 2017-02-11 ENCOUNTER — Emergency Department (HOSPITAL_COMMUNITY): Payer: No Typology Code available for payment source

## 2017-02-11 ENCOUNTER — Emergency Department (HOSPITAL_COMMUNITY): Payer: No Typology Code available for payment source | Admitting: Certified Registered Nurse Anesthetist

## 2017-02-11 ENCOUNTER — Inpatient Hospital Stay (HOSPITAL_COMMUNITY)
Admission: EM | Admit: 2017-02-11 | Discharge: 2017-02-20 | DRG: 481 | Disposition: A | Discharge status: Skilled Nursing Facility | Payer: No Typology Code available for payment source | Attending: Family Medicine | Admitting: Family Medicine

## 2017-02-11 ENCOUNTER — Encounter (HOSPITAL_COMMUNITY)
Admission: EM | Disposition: A | Discharge status: Skilled Nursing Facility | Payer: Self-pay | Source: Home / Self Care | Attending: Internal Medicine

## 2017-02-11 ENCOUNTER — Inpatient Hospital Stay (HOSPITAL_COMMUNITY): Payer: No Typology Code available for payment source

## 2017-02-11 ENCOUNTER — Encounter (HOSPITAL_COMMUNITY): Payer: Self-pay | Admitting: Emergency Medicine

## 2017-02-11 DIAGNOSIS — E876 Hypokalemia: Secondary | ICD-10-CM | POA: Diagnosis not present

## 2017-02-11 DIAGNOSIS — I2510 Atherosclerotic heart disease of native coronary artery without angina pectoris: Secondary | ICD-10-CM | POA: Diagnosis present

## 2017-02-11 DIAGNOSIS — S32402A Unspecified fracture of left acetabulum, initial encounter for closed fracture: Secondary | ICD-10-CM | POA: Diagnosis present

## 2017-02-11 DIAGNOSIS — S81831A Puncture wound without foreign body, right lower leg, initial encounter: Secondary | ICD-10-CM | POA: Diagnosis present

## 2017-02-11 DIAGNOSIS — S81811A Laceration without foreign body, right lower leg, initial encounter: Secondary | ICD-10-CM | POA: Diagnosis present

## 2017-02-11 DIAGNOSIS — Z951 Presence of aortocoronary bypass graft: Secondary | ICD-10-CM

## 2017-02-11 DIAGNOSIS — I252 Old myocardial infarction: Secondary | ICD-10-CM | POA: Diagnosis not present

## 2017-02-11 DIAGNOSIS — S81011A Laceration without foreign body, right knee, initial encounter: Secondary | ICD-10-CM | POA: Diagnosis present

## 2017-02-11 DIAGNOSIS — J449 Chronic obstructive pulmonary disease, unspecified: Secondary | ICD-10-CM | POA: Diagnosis present

## 2017-02-11 DIAGNOSIS — R601 Generalized edema: Secondary | ICD-10-CM | POA: Diagnosis not present

## 2017-02-11 DIAGNOSIS — Z720 Tobacco use: Secondary | ICD-10-CM | POA: Diagnosis present

## 2017-02-11 DIAGNOSIS — Z9889 Other specified postprocedural states: Secondary | ICD-10-CM

## 2017-02-11 DIAGNOSIS — E669 Obesity, unspecified: Secondary | ICD-10-CM | POA: Diagnosis present

## 2017-02-11 DIAGNOSIS — Z23 Encounter for immunization: Secondary | ICD-10-CM

## 2017-02-11 DIAGNOSIS — G40909 Epilepsy, unspecified, not intractable, without status epilepticus: Secondary | ICD-10-CM | POA: Diagnosis present

## 2017-02-11 DIAGNOSIS — E1151 Type 2 diabetes mellitus with diabetic peripheral angiopathy without gangrene: Secondary | ICD-10-CM | POA: Diagnosis present

## 2017-02-11 DIAGNOSIS — S3022XA Contusion of scrotum and testes, initial encounter: Secondary | ICD-10-CM | POA: Diagnosis present

## 2017-02-11 DIAGNOSIS — I10 Essential (primary) hypertension: Secondary | ICD-10-CM | POA: Diagnosis present

## 2017-02-11 DIAGNOSIS — T148XXA Other injury of unspecified body region, initial encounter: Secondary | ICD-10-CM

## 2017-02-11 DIAGNOSIS — S81001A Unspecified open wound, right knee, initial encounter: Secondary | ICD-10-CM

## 2017-02-11 DIAGNOSIS — Y92410 Unspecified street and highway as the place of occurrence of the external cause: Secondary | ICD-10-CM

## 2017-02-11 DIAGNOSIS — T501X5A Adverse effect of loop [high-ceiling] diuretics, initial encounter: Secondary | ICD-10-CM | POA: Diagnosis not present

## 2017-02-11 DIAGNOSIS — Z8673 Personal history of transient ischemic attack (TIA), and cerebral infarction without residual deficits: Secondary | ICD-10-CM

## 2017-02-11 DIAGNOSIS — S86921A Laceration of unspecified muscle(s) and tendon(s) at lower leg level, right leg, initial encounter: Secondary | ICD-10-CM | POA: Diagnosis present

## 2017-02-11 DIAGNOSIS — D62 Acute posthemorrhagic anemia: Secondary | ICD-10-CM | POA: Diagnosis not present

## 2017-02-11 DIAGNOSIS — F17210 Nicotine dependence, cigarettes, uncomplicated: Secondary | ICD-10-CM | POA: Diagnosis present

## 2017-02-11 DIAGNOSIS — R0600 Dyspnea, unspecified: Secondary | ICD-10-CM

## 2017-02-11 DIAGNOSIS — E8809 Other disorders of plasma-protein metabolism, not elsewhere classified: Secondary | ICD-10-CM | POA: Diagnosis not present

## 2017-02-11 DIAGNOSIS — N5089 Other specified disorders of the male genital organs: Secondary | ICD-10-CM | POA: Diagnosis present

## 2017-02-11 DIAGNOSIS — F319 Bipolar disorder, unspecified: Secondary | ICD-10-CM | POA: Diagnosis present

## 2017-02-11 DIAGNOSIS — R52 Pain, unspecified: Secondary | ICD-10-CM

## 2017-02-11 DIAGNOSIS — Z9119 Patient's noncompliance with other medical treatment and regimen: Secondary | ICD-10-CM

## 2017-02-11 DIAGNOSIS — Z419 Encounter for procedure for purposes other than remedying health state, unspecified: Secondary | ICD-10-CM

## 2017-02-11 DIAGNOSIS — Z6830 Body mass index (BMI) 30.0-30.9, adult: Secondary | ICD-10-CM | POA: Diagnosis not present

## 2017-02-11 DIAGNOSIS — Z79899 Other long term (current) drug therapy: Secondary | ICD-10-CM

## 2017-02-11 DIAGNOSIS — S32462A Displaced associated transverse-posterior fracture of left acetabulum, initial encounter for closed fracture: Secondary | ICD-10-CM

## 2017-02-11 HISTORY — PX: INSERTION OF TRACTION PIN: SHX6560

## 2017-02-11 HISTORY — DX: Cerebral infarction, unspecified: I63.9

## 2017-02-11 HISTORY — DX: Unspecified convulsions: R56.9

## 2017-02-11 HISTORY — PX: APPLICATION OF WOUND VAC: SHX5189

## 2017-02-11 HISTORY — DX: Acute myocardial infarction, unspecified: I21.9

## 2017-02-11 HISTORY — PX: INCISION AND DRAINAGE: SHX5863

## 2017-02-11 LAB — URINALYSIS, ROUTINE W REFLEX MICROSCOPIC
Bilirubin Urine: NEGATIVE
GLUCOSE, UA: NEGATIVE mg/dL
Hgb urine dipstick: NEGATIVE
Ketones, ur: NEGATIVE mg/dL
LEUKOCYTES UA: NEGATIVE
Nitrite: NEGATIVE
PROTEIN: NEGATIVE mg/dL
pH: 6 (ref 5.0–8.0)

## 2017-02-11 LAB — CBC
HEMATOCRIT: 42.2 % (ref 39.0–52.0)
HEMOGLOBIN: 14.4 g/dL (ref 13.0–17.0)
MCH: 32.3 pg (ref 26.0–34.0)
MCHC: 34.1 g/dL (ref 30.0–36.0)
MCV: 94.6 fL (ref 78.0–100.0)
Platelets: 266 10*3/uL (ref 150–400)
RBC: 4.46 MIL/uL (ref 4.22–5.81)
RDW: 13.3 % (ref 11.5–15.5)
WBC: 15.1 10*3/uL — ABNORMAL HIGH (ref 4.0–10.5)

## 2017-02-11 LAB — COMPREHENSIVE METABOLIC PANEL
ALBUMIN: 3.9 g/dL (ref 3.5–5.0)
ALK PHOS: 84 U/L (ref 38–126)
ALT: 29 U/L (ref 17–63)
AST: 28 U/L (ref 15–41)
Anion gap: 10 (ref 5–15)
BUN: 12 mg/dL (ref 6–20)
CALCIUM: 9.5 mg/dL (ref 8.9–10.3)
CHLORIDE: 103 mmol/L (ref 101–111)
CO2: 24 mmol/L (ref 22–32)
CREATININE: 1.1 mg/dL (ref 0.61–1.24)
GFR calc Af Amer: >60 mL/min (ref >60)
GFR calc non Af Amer: >60 mL/min (ref >60)
GLUCOSE: 124 mg/dL — AB (ref 65–99)
Potassium: 4.1 mmol/L (ref 3.5–5.1)
SODIUM: 137 mmol/L (ref 135–145)
Total Bilirubin: 0.7 mg/dL (ref 0.3–1.2)
Total Protein: 7.2 g/dL (ref 6.5–8.1)

## 2017-02-11 LAB — I-STAT CHEM 8, ED
BUN: 13 mg/dL (ref 6–20)
CHLORIDE: 104 mmol/L (ref 101–111)
Calcium, Ion: 1.17 mmol/L (ref 1.15–1.40)
Creatinine, Ser: 1 mg/dL (ref 0.61–1.24)
Glucose, Bld: 129 mg/dL — ABNORMAL HIGH (ref 65–99)
HEMATOCRIT: 42 % (ref 39.0–52.0)
HEMOGLOBIN: 14.3 g/dL (ref 13.0–17.0)
POTASSIUM: 4 mmol/L (ref 3.5–5.1)
SODIUM: 138 mmol/L (ref 135–145)
TCO2: 24 mmol/L (ref 22–32)

## 2017-02-11 LAB — SAMPLE TO BLOOD BANK

## 2017-02-11 LAB — I-STAT CG4 LACTIC ACID, ED: LACTIC ACID, VENOUS: 1.72 mmol/L (ref 0.5–1.9)

## 2017-02-11 LAB — VALPROIC ACID LEVEL

## 2017-02-11 LAB — PROTIME-INR
INR: 0.93
Prothrombin Time: 12.4 seconds (ref 11.4–15.2)

## 2017-02-11 LAB — ETHANOL

## 2017-02-11 SURGERY — INCISION AND DRAINAGE
Anesthesia: General | Site: Leg Upper | Laterality: Right

## 2017-02-11 MED ORDER — LIDOCAINE 2% (20 MG/ML) 5 ML SYRINGE
INTRAMUSCULAR | Status: AC
  Filled 2017-02-11: qty 5

## 2017-02-11 MED ORDER — IPRATROPIUM-ALBUTEROL 0.5-2.5 (3) MG/3ML IN SOLN: 3.0000 mL | Freq: Four times a day (QID) | RESPIRATORY_TRACT | Status: DC | PRN

## 2017-02-11 MED ORDER — POLYETHYLENE GLYCOL 3350 17 G PO PACK
17.0000 g | PACK | Freq: Every day | ORAL | Status: DC | PRN
  Administered 2017-02-18 – 2017-02-19 (×2): 17 g via ORAL
  Filled 2017-02-11 (×2): qty 1

## 2017-02-11 MED ORDER — DEXAMETHASONE SODIUM PHOSPHATE 10 MG/ML IJ SOLN
INTRAMUSCULAR | Status: DC | PRN
  Administered 2017-02-11: 10 mg via INTRAVENOUS

## 2017-02-11 MED ORDER — SUCCINYLCHOLINE CHLORIDE 200 MG/10ML IV SOSY
PREFILLED_SYRINGE | INTRAVENOUS | Status: DC | PRN
  Administered 2017-02-11: 120 mg via INTRAVENOUS

## 2017-02-11 MED ORDER — ACETAMINOPHEN 325 MG PO TABS: 650.0000 mg | ORAL_TABLET | Freq: Four times a day (QID) | ORAL | Status: DC | PRN

## 2017-02-11 MED ORDER — SUGAMMADEX SODIUM 200 MG/2ML IV SOLN
INTRAVENOUS | Status: AC
  Filled 2017-02-11: qty 2

## 2017-02-11 MED ORDER — DIPHENHYDRAMINE HCL 12.5 MG/5ML PO ELIX
25.0000 mg | ORAL_SOLUTION | ORAL | Status: DC | PRN
  Administered 2017-02-12 (×2): 25 mg via ORAL
  Filled 2017-02-11 (×3): qty 10

## 2017-02-11 MED ORDER — MORPHINE SULFATE (PF) 4 MG/ML IV SOLN: 4.0000 mg | Freq: Once | INTRAVENOUS | Status: DC

## 2017-02-11 MED ORDER — SODIUM CHLORIDE 0.9 % IR SOLN
Status: DC | PRN
  Administered 2017-02-11 (×3): 3000 mL

## 2017-02-11 MED ORDER — 0.9 % SODIUM CHLORIDE (POUR BTL) OPTIME
TOPICAL | Status: DC | PRN
  Administered 2017-02-11: 1000 mL

## 2017-02-11 MED ORDER — ONDANSETRON HCL 4 MG/2ML IJ SOLN
4.0000 mg | Freq: Once | INTRAMUSCULAR | Status: AC
  Administered 2017-02-11: 4 mg via INTRAVENOUS
  Filled 2017-02-11: qty 2

## 2017-02-11 MED ORDER — IPRATROPIUM-ALBUTEROL 0.5-2.5 (3) MG/3ML IN SOLN
3.0000 mL | Freq: Four times a day (QID) | RESPIRATORY_TRACT | Status: DC
  Administered 2017-02-11: 3 mL via RESPIRATORY_TRACT
  Filled 2017-02-11: qty 3

## 2017-02-11 MED ORDER — CEFAZOLIN SODIUM-DEXTROSE 2-4 GM/100ML-% IV SOLN
2.0000 g | Freq: Four times a day (QID) | INTRAVENOUS | Status: AC
  Administered 2017-02-11 – 2017-02-12 (×3): 2 g via INTRAVENOUS
  Filled 2017-02-11 (×4): qty 100

## 2017-02-11 MED ORDER — FENTANYL CITRATE (PF) 250 MCG/5ML IJ SOLN
INTRAMUSCULAR | Status: AC
  Filled 2017-02-11: qty 5

## 2017-02-11 MED ORDER — SUGAMMADEX SODIUM 200 MG/2ML IV SOLN
INTRAVENOUS | Status: DC | PRN
  Administered 2017-02-11: 200 mg via INTRAVENOUS

## 2017-02-11 MED ORDER — HYDROMORPHONE HCL 1 MG/ML IJ SOLN
1.0000 mg | Freq: Once | INTRAMUSCULAR | Status: AC
  Administered 2017-02-11: 1 mg via INTRAVENOUS
  Filled 2017-02-11: qty 1

## 2017-02-11 MED ORDER — PROPOFOL 10 MG/ML IV BOLUS
INTRAVENOUS | Status: DC | PRN
  Administered 2017-02-11: 200 mg via INTRAVENOUS

## 2017-02-11 MED ORDER — AMOXICILLIN-POT CLAVULANATE 875-125 MG PO TABS
1.0000 | ORAL_TABLET | Freq: Two times a day (BID) | ORAL | Status: DC
  Administered 2017-02-11: 1 via ORAL
  Filled 2017-02-11: qty 1

## 2017-02-11 MED ORDER — LIDOCAINE HCL (CARDIAC) 20 MG/ML IV SOLN
INTRAVENOUS | Status: DC | PRN
  Administered 2017-02-11: 80 mg via INTRAVENOUS

## 2017-02-11 MED ORDER — ONDANSETRON HCL 4 MG/2ML IJ SOLN: 4.0000 mg | Freq: Four times a day (QID) | INTRAMUSCULAR | Status: DC | PRN

## 2017-02-11 MED ORDER — DIPHENHYDRAMINE HCL 50 MG/ML IJ SOLN
25.0000 mg | Freq: Once | INTRAMUSCULAR | Status: AC
  Administered 2017-02-11: 25 mg via INTRAVENOUS
  Filled 2017-02-11: qty 1

## 2017-02-11 MED ORDER — EPHEDRINE SULFATE-NACL 50-0.9 MG/10ML-% IV SOSY
PREFILLED_SYRINGE | INTRAVENOUS | Status: DC | PRN
  Administered 2017-02-11 (×3): 10 mg via INTRAVENOUS

## 2017-02-11 MED ORDER — ALBUTEROL SULFATE HFA 108 (90 BASE) MCG/ACT IN AERS
INHALATION_SPRAY | RESPIRATORY_TRACT | Status: DC | PRN
  Administered 2017-02-11: 6 via RESPIRATORY_TRACT

## 2017-02-11 MED ORDER — PROMETHAZINE HCL 25 MG/ML IJ SOLN: 6.2500 mg | INTRAMUSCULAR | Status: DC | PRN

## 2017-02-11 MED ORDER — NICOTINE 21 MG/24HR TD PT24
21.0000 mg | MEDICATED_PATCH | Freq: Once | TRANSDERMAL | Status: AC
  Administered 2017-02-11: 21 mg via TRANSDERMAL
  Filled 2017-02-11: qty 1

## 2017-02-11 MED ORDER — HYDROMORPHONE HCL 1 MG/ML IJ SOLN
INTRAMUSCULAR | Status: AC
  Administered 2017-02-11: 0.5 mg via INTRAVENOUS
  Filled 2017-02-11: qty 1

## 2017-02-11 MED ORDER — SODIUM CHLORIDE 0.9 % IV BOLUS (SEPSIS)
1000.0000 mL | Freq: Once | INTRAVENOUS | Status: AC
  Administered 2017-02-11: 1000 mL via INTRAVENOUS

## 2017-02-11 MED ORDER — OXYCODONE HCL 5 MG PO TABS
ORAL_TABLET | ORAL | Status: AC
  Filled 2017-02-11: qty 1

## 2017-02-11 MED ORDER — LACTATED RINGERS IV SOLN
INTRAVENOUS | Status: DC
  Administered 2017-02-11 – 2017-02-14 (×4): via INTRAVENOUS

## 2017-02-11 MED ORDER — PANTOPRAZOLE SODIUM 40 MG PO TBEC
40.0000 mg | DELAYED_RELEASE_TABLET | Freq: Every day | ORAL | Status: DC
  Administered 2017-02-11 – 2017-02-13 (×2): 40 mg via ORAL
  Filled 2017-02-11 (×2): qty 1

## 2017-02-11 MED ORDER — OXYCODONE HCL 5 MG PO TABS
5.0000 mg | ORAL_TABLET | ORAL | Status: DC | PRN
  Administered 2017-02-11: 10 mg via ORAL
  Administered 2017-02-11 – 2017-02-19 (×35): 15 mg via ORAL
  Administered 2017-02-19: 10 mg via ORAL
  Administered 2017-02-19 (×4): 15 mg via ORAL
  Administered 2017-02-20: 10 mg via ORAL
  Administered 2017-02-20: 15 mg via ORAL
  Administered 2017-02-20: 5 mg via ORAL
  Administered 2017-02-20: 15 mg via ORAL
  Administered 2017-02-20: 10 mg via ORAL
  Filled 2017-02-11 (×5): qty 3
  Filled 2017-02-11: qty 2
  Filled 2017-02-11 (×27): qty 3
  Filled 2017-02-11: qty 1
  Filled 2017-02-11 (×9): qty 3
  Filled 2017-02-11: qty 2
  Filled 2017-02-11 (×2): qty 3

## 2017-02-11 MED ORDER — METHOCARBAMOL 500 MG PO TABS
500.0000 mg | ORAL_TABLET | Freq: Four times a day (QID) | ORAL | Status: DC | PRN
  Administered 2017-02-11 – 2017-02-20 (×9): 500 mg via ORAL
  Filled 2017-02-11 (×8): qty 1

## 2017-02-11 MED ORDER — FENTANYL CITRATE (PF) 100 MCG/2ML IJ SOLN
25.0000 ug | Freq: Once | INTRAMUSCULAR | Status: DC
Start: 2017-02-11 — End: 2017-02-11

## 2017-02-11 MED ORDER — ACETAMINOPHEN 650 MG RE SUPP: 650.0000 mg | Freq: Four times a day (QID) | RECTAL | Status: DC | PRN

## 2017-02-11 MED ORDER — CEFAZOLIN SODIUM-DEXTROSE 2-4 GM/100ML-% IV SOLN
2.0000 g | Freq: Once | INTRAVENOUS | Status: AC
  Administered 2017-02-11: 2 g via INTRAVENOUS
  Filled 2017-02-11: qty 100

## 2017-02-11 MED ORDER — MORPHINE SULFATE (PF) 4 MG/ML IV SOLN
1.0000 mg | INTRAVENOUS | Status: DC | PRN
  Administered 2017-02-11 – 2017-02-12 (×6): 1 mg via INTRAVENOUS
  Filled 2017-02-11 (×6): qty 1

## 2017-02-11 MED ORDER — METHOCARBAMOL 500 MG PO TABS
ORAL_TABLET | ORAL | Status: AC
  Filled 2017-02-11: qty 1

## 2017-02-11 MED ORDER — METOCLOPRAMIDE HCL 5 MG PO TABS
5.0000 mg | ORAL_TABLET | Freq: Three times a day (TID) | ORAL | Status: DC | PRN
  Administered 2017-02-11: 10 mg via ORAL
  Filled 2017-02-11: qty 2

## 2017-02-11 MED ORDER — MIDAZOLAM HCL 2 MG/2ML IJ SOLN
INTRAMUSCULAR | Status: AC
  Filled 2017-02-11: qty 2

## 2017-02-11 MED ORDER — METOCLOPRAMIDE HCL 5 MG/ML IJ SOLN: 5.0000 mg | Freq: Three times a day (TID) | INTRAMUSCULAR | Status: DC | PRN

## 2017-02-11 MED ORDER — ROCURONIUM BROMIDE 10 MG/ML (PF) SYRINGE
PREFILLED_SYRINGE | INTRAVENOUS | Status: AC
  Filled 2017-02-11: qty 5

## 2017-02-11 MED ORDER — ONDANSETRON HCL 4 MG/2ML IJ SOLN
INTRAMUSCULAR | Status: DC | PRN
  Administered 2017-02-11: 4 mg via INTRAVENOUS

## 2017-02-11 MED ORDER — SORBITOL 70 % SOLN: 30.0000 mL | Freq: Every day | Status: DC | PRN

## 2017-02-11 MED ORDER — SODIUM CHLORIDE 0.9 % IV SOLN
INTRAVENOUS | Status: DC
  Administered 2017-02-11 – 2017-02-13 (×3): via INTRAVENOUS

## 2017-02-11 MED ORDER — HYDRALAZINE HCL 20 MG/ML IJ SOLN
10.0000 mg | Freq: Four times a day (QID) | INTRAMUSCULAR | Status: DC | PRN
  Administered 2017-02-12: 10 mg via INTRAVENOUS
  Filled 2017-02-11: qty 1

## 2017-02-11 MED ORDER — ENOXAPARIN SODIUM 40 MG/0.4ML ~~LOC~~ SOLN: 40.0000 mg | SUBCUTANEOUS | Status: DC

## 2017-02-11 MED ORDER — DIVALPROEX SODIUM 500 MG PO DR TAB
1000.0000 mg | DELAYED_RELEASE_TABLET | Freq: Every day | ORAL | Status: DC
  Administered 2017-02-11 – 2017-02-17 (×6): 1000 mg via ORAL
  Filled 2017-02-11 (×6): qty 2

## 2017-02-11 MED ORDER — MAGNESIUM CITRATE PO SOLN
1.0000 | Freq: Once | ORAL | Status: AC | PRN
  Administered 2017-02-17: 1 via ORAL
  Filled 2017-02-11: qty 296

## 2017-02-11 MED ORDER — IOPAMIDOL (ISOVUE-300) INJECTION 61%
INTRAVENOUS | Status: AC
  Administered 2017-02-11: 100 mL
  Filled 2017-02-11: qty 100

## 2017-02-11 MED ORDER — ONDANSETRON HCL 4 MG/2ML IJ SOLN
INTRAMUSCULAR | Status: AC
  Filled 2017-02-11: qty 2

## 2017-02-11 MED ORDER — ONDANSETRON HCL 4 MG PO TABS
4.0000 mg | ORAL_TABLET | Freq: Four times a day (QID) | ORAL | Status: DC | PRN
  Administered 2017-02-11 – 2017-02-13 (×2): 4 mg via ORAL
  Filled 2017-02-11 (×2): qty 1

## 2017-02-11 MED ORDER — HYDROMORPHONE HCL 1 MG/ML IJ SOLN
0.2500 mg | INTRAMUSCULAR | Status: DC | PRN
  Administered 2017-02-11 (×4): 0.5 mg via INTRAVENOUS

## 2017-02-11 MED ORDER — ROCURONIUM BROMIDE 100 MG/10ML IV SOLN
INTRAVENOUS | Status: DC | PRN
  Administered 2017-02-11: 50 mg via INTRAVENOUS

## 2017-02-11 MED ORDER — PROMETHAZINE HCL 25 MG/ML IJ SOLN
INTRAMUSCULAR | Status: AC
  Filled 2017-02-11: qty 1

## 2017-02-11 MED ORDER — KETOROLAC TROMETHAMINE 15 MG/ML IJ SOLN
30.0000 mg | Freq: Four times a day (QID) | INTRAMUSCULAR | Status: AC
  Administered 2017-02-11 – 2017-02-12 (×2): 30 mg via INTRAVENOUS
  Filled 2017-02-11 (×2): qty 2

## 2017-02-11 MED ORDER — FENTANYL CITRATE (PF) 100 MCG/2ML IJ SOLN
INTRAMUSCULAR | Status: DC | PRN
  Administered 2017-02-11 (×5): 50 ug via INTRAVENOUS

## 2017-02-11 MED ORDER — ALBUTEROL SULFATE (2.5 MG/3ML) 0.083% IN NEBU
2.5000 mg | INHALATION_SOLUTION | Freq: Four times a day (QID) | RESPIRATORY_TRACT | Status: AC | PRN
  Administered 2017-02-11: 2.5 mg via RESPIRATORY_TRACT

## 2017-02-11 MED ORDER — METHOCARBAMOL 1000 MG/10ML IJ SOLN
500.0000 mg | Freq: Four times a day (QID) | INTRAMUSCULAR | Status: DC | PRN
  Filled 2017-02-11: qty 5

## 2017-02-11 MED ORDER — TETANUS-DIPHTH-ACELL PERTUSSIS 5-2.5-18.5 LF-MCG/0.5 IM SUSP
0.5000 mL | Freq: Once | INTRAMUSCULAR | Status: AC
  Administered 2017-02-11: 0.5 mL via INTRAMUSCULAR
  Filled 2017-02-11: qty 0.5

## 2017-02-11 MED ORDER — KETOROLAC TROMETHAMINE 30 MG/ML IJ SOLN
INTRAMUSCULAR | Status: AC
  Administered 2017-02-11: 30 mg
  Filled 2017-02-11: qty 1

## 2017-02-11 MED ORDER — FENTANYL CITRATE (PF) 100 MCG/2ML IJ SOLN
50.0000 ug | Freq: Once | INTRAMUSCULAR | Status: AC
  Administered 2017-02-11: 50 ug via INTRAVENOUS
  Filled 2017-02-11: qty 2

## 2017-02-11 MED ORDER — MIDAZOLAM HCL 5 MG/5ML IJ SOLN
INTRAMUSCULAR | Status: DC | PRN
  Administered 2017-02-11: 2 mg via INTRAVENOUS

## 2017-02-11 MED ORDER — DEXAMETHASONE SODIUM PHOSPHATE 10 MG/ML IJ SOLN
INTRAMUSCULAR | Status: AC
  Filled 2017-02-11: qty 1

## 2017-02-11 MED ORDER — MORPHINE SULFATE (PF) 4 MG/ML IV SOLN
6.0000 mg | Freq: Once | INTRAVENOUS | Status: AC
  Administered 2017-02-11: 6 mg via INTRAVENOUS
  Filled 2017-02-11: qty 2

## 2017-02-11 MED ORDER — ALBUTEROL SULFATE (2.5 MG/3ML) 0.083% IN NEBU
INHALATION_SOLUTION | RESPIRATORY_TRACT | Status: AC
  Administered 2017-02-11: 2.5 mg via RESPIRATORY_TRACT
  Filled 2017-02-11: qty 3

## 2017-02-11 SURGICAL SUPPLY — 57 items
BANDAGE ACE 3X5.8 VEL STRL LF (GAUZE/BANDAGES/DRESSINGS) IMPLANT
BANDAGE ELASTIC 6 VELCRO ST LF (GAUZE/BANDAGES/DRESSINGS) ×2 IMPLANT
BNDG COHESIVE 1X5 TAN STRL LF (GAUZE/BANDAGES/DRESSINGS) IMPLANT
BNDG COHESIVE 4X5 TAN STRL (GAUZE/BANDAGES/DRESSINGS) ×4 IMPLANT
BNDG COHESIVE 6X5 TAN STRL LF (GAUZE/BANDAGES/DRESSINGS) ×8 IMPLANT
BNDG CONFORM 3 STRL LF (GAUZE/BANDAGES/DRESSINGS) IMPLANT
BNDG GAUZE ELAST 4 BULKY (GAUZE/BANDAGES/DRESSINGS) ×2 IMPLANT
BNDG GAUZE STRTCH 6 (GAUZE/BANDAGES/DRESSINGS) ×4 IMPLANT
CORDS BIPOLAR (ELECTRODE) IMPLANT
COVER SURGICAL LIGHT HANDLE (MISCELLANEOUS) ×4 IMPLANT
CUFF TOURNIQUET SINGLE 24IN (TOURNIQUET CUFF) IMPLANT
CUFF TOURNIQUET SINGLE 34IN LL (TOURNIQUET CUFF) ×2 IMPLANT
CUFF TOURNIQUET SINGLE 44IN (TOURNIQUET CUFF) IMPLANT
DRAPE INCISE IOBAN 66X45 STRL (DRAPES) ×4 IMPLANT
DRAPE U-SHAPE 47X51 STRL (DRAPES) ×4 IMPLANT
DURAPREP 26ML APPLICATOR (WOUND CARE) ×4 IMPLANT
ELECT REM PT RETURN 9FT ADLT (ELECTROSURGICAL)
ELECTRODE REM PT RTRN 9FT ADLT (ELECTROSURGICAL) IMPLANT
GAUZE SPONGE 4X4 12PLY STRL (GAUZE/BANDAGES/DRESSINGS) ×8 IMPLANT
GAUZE SPONGE 4X4 16PLY XRAY LF (GAUZE/BANDAGES/DRESSINGS) ×2 IMPLANT
GAUZE XEROFORM 1X8 LF (GAUZE/BANDAGES/DRESSINGS) ×2 IMPLANT
GAUZE XEROFORM 5X9 LF (GAUZE/BANDAGES/DRESSINGS) ×4 IMPLANT
GLOVE SKINSENSE NS SZ7.5 (GLOVE) ×4
GLOVE SKINSENSE STRL SZ7.5 (GLOVE) ×4 IMPLANT
GOWN STRL REIN XL XLG (GOWN DISPOSABLE) ×8 IMPLANT
HANDPIECE INTERPULSE COAX TIP (DISPOSABLE)
K-WIRE TROCAR NS 2.5X285 (MISCELLANEOUS) ×4
KIT BASIN OR (CUSTOM PROCEDURE TRAY) ×4 IMPLANT
KIT ROOM TURNOVER OR (KITS) ×4 IMPLANT
KWIRE TROCAR NS 2.5X285 (MISCELLANEOUS) IMPLANT
LOOP VESSEL MAXI BLUE (MISCELLANEOUS) ×2 IMPLANT
MANIFOLD NEPTUNE II (INSTRUMENTS) ×4 IMPLANT
PACK ORTHO EXTREMITY (CUSTOM PROCEDURE TRAY) ×4 IMPLANT
PAD ABD 8X10 STRL (GAUZE/BANDAGES/DRESSINGS) ×4 IMPLANT
PAD ARMBOARD 7.5X6 YLW CONV (MISCELLANEOUS) ×8 IMPLANT
PADDING CAST ABS 4INX4YD NS (CAST SUPPLIES) ×2
PADDING CAST ABS COTTON 4X4 ST (CAST SUPPLIES) ×2 IMPLANT
PADDING CAST COTTON 6X4 STRL (CAST SUPPLIES) ×4 IMPLANT
PADDING CAST SYNTHETIC 4 (CAST SUPPLIES) ×2
PADDING CAST SYNTHETIC 4X4 STR (CAST SUPPLIES) IMPLANT
SET HNDPC FAN SPRY TIP SCT (DISPOSABLE) IMPLANT
SPONGE LAP 18X18 X RAY DECT (DISPOSABLE) ×4 IMPLANT
STOCKINETTE IMPERVIOUS 9X36 MD (GAUZE/BANDAGES/DRESSINGS) ×4 IMPLANT
SUT ETHILON 2 0 FS 18 (SUTURE) ×14 IMPLANT
SUT ETHILON 2 0 PSLX (SUTURE) ×4 IMPLANT
SUT VIC AB 2-0 FS1 27 (SUTURE) ×8 IMPLANT
SWAB CULTURE ESWAB REG 1ML (MISCELLANEOUS) IMPLANT
TOWEL OR 17X24 6PK STRL BLUE (TOWEL DISPOSABLE) ×4 IMPLANT
TOWEL OR 17X26 10 PK STRL BLUE (TOWEL DISPOSABLE) ×4 IMPLANT
TUBE CONNECTING 12'X1/4 (SUCTIONS) ×1
TUBE CONNECTING 12X1/4 (SUCTIONS) ×3 IMPLANT
TUBE FEEDING ENTERAL 5FR 16IN (TUBING) IMPLANT
TUBING CYSTO DISP (UROLOGICAL SUPPLIES) ×4 IMPLANT
UNDERPAD 30X30 (UNDERPADS AND DIAPERS) ×8 IMPLANT
WATER STERILE IRR 1000ML POUR (IV SOLUTION) ×4 IMPLANT
WND VAC CANISTER 500ML (MISCELLANEOUS) ×2 IMPLANT
YANKAUER SUCT BULB TIP NO VENT (SUCTIONS) ×4 IMPLANT
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
## 2017-02-11 NOTE — Progress Notes (Signed)
Dr Roda Shutters called to request a hospitalist consult for co-morbidities & home meds & pt's complaints of "cough x 1 week with green stuff"

## 2017-02-11 NOTE — Progress Notes (Signed)
Pt on phone with his dad since 46- hospitalist at bedside for consult- dinner tray ordered @ 1800- awaiting arrival
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
## 2017-02-11 NOTE — ED Notes (Signed)
Trauma End Time:  1326
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
## 2017-02-11 NOTE — Transfer of Care (Signed)
Immediate Anesthesia Transfer of Care Note  Patient: Steven [name]  Procedure(s) Performed: INCISION AND DRAINAGE RIGHT LEG (Right Leg Upper) INSERTION OF TRACTION PIN LEFT LEG (Left Leg Upper) APPLICATION OF WOUND VAC (Right Leg Upper)  Patient Location: PACU  Anesthesia Type:General  Level of Consciousness: awake and alert   Airway & Oxygen Therapy: Patient Spontanous Breathing and Patient connected to nasal cannula oxygen  Post-op Assessment: Report given to RN and Post -op Vital signs reviewed and stable  Post vital signs: Reviewed and stable  Last Vitals:  Vitals:   02/11/17 1353 02/11/17 1354  BP:    Pulse: 80 84  Resp:    Temp:    SpO2: 97% 97%    Last Pain:  Vitals:   02/11/17 1037  TempSrc:   PainSc: 10-Worst pain ever      Patients Stated Pain Goal: 0 (02/11/17 1020)  Complications: No apparent anesthesia complications

## 2017-02-11 NOTE — Anesthesia Postprocedure Evaluation (Signed)
Anesthesia Post Note  Patient: Steven [name]  Procedure(s) Performed: INCISION AND DRAINAGE RIGHT LEG (Right Leg Upper) INSERTION OF TRACTION PIN LEFT LEG (Left Leg Upper) APPLICATION OF WOUND VAC (Right Leg Upper)     Patient location during evaluation: PACU Anesthesia Type: General Level of consciousness: awake and alert Pain management: pain level controlled Vital Signs Assessment: post-procedure vital signs reviewed and stable Respiratory status: spontaneous breathing, nonlabored ventilation, respiratory function stable and patient connected to nasal cannula oxygen Cardiovascular status: blood pressure returned to baseline and stable Postop Assessment: no apparent nausea or vomiting Anesthetic complications: no    Last Vitals:  Vitals:   02/11/17 1534 02/11/17 1549  BP: (!) 171/86 (!) 165/89  Pulse: 90 93  Resp: (!) 22 16  Temp:    SpO2: 94% 94%    Last Pain:  Vitals:   02/11/17 1037  TempSrc:   PainSc: 10-Worst pain ever                 Jesenya Bowditch S
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
## 2017-02-11 NOTE — ED Triage Notes (Signed)
Pt returned from CT scan . Pt refused to have  X-Rays  Because of pain.
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
## 2017-02-11 NOTE — Anesthesia Procedure Notes (Signed)
Procedure Name: Intubation Date/Time: 02/11/2017 2:03 PM Performed by: Willeen Cass P Pre-anesthesia Checklist: Patient identified, Emergency Drugs available, Suction available and Patient being monitored Patient Re-evaluated:Patient Re-evaluated prior to induction Oxygen Delivery Method: Circle System Utilized Preoxygenation: Pre-oxygenation with 100% oxygen Induction Type: IV induction Ventilation: Mask ventilation without difficulty Laryngoscope Size: Mac and 4 Grade View: Grade I Tube type: Oral Tube size: 7.5 mm Number of attempts: 1 Airway Equipment and Method: Stylet Placement Confirmation: ETT inserted through vocal cords under direct vision,  positive ETCO2 and breath sounds checked- equal and bilateral Secured at: 22 cm Tube secured with: Tape Dental Injury: Teeth and Oropharynx as per pre-operative assessment

## 2017-02-12 ENCOUNTER — Inpatient Hospital Stay (HOSPITAL_COMMUNITY): Payer: No Typology Code available for payment source

## 2017-02-12 ENCOUNTER — Encounter (HOSPITAL_COMMUNITY): Payer: Self-pay | Admitting: Orthopaedic Surgery

## 2017-02-12 ENCOUNTER — Inpatient Hospital Stay (HOSPITAL_COMMUNITY): Payer: No Typology Code available for payment source | Admitting: Certified Registered Nurse Anesthetist

## 2017-02-12 ENCOUNTER — Encounter (HOSPITAL_COMMUNITY)
Admission: EM | Disposition: A | Discharge status: Skilled Nursing Facility | Payer: Self-pay | Source: Home / Self Care | Attending: Internal Medicine

## 2017-02-12 DIAGNOSIS — E669 Obesity, unspecified: Secondary | ICD-10-CM | POA: Diagnosis present

## 2017-02-12 DIAGNOSIS — Z720 Tobacco use: Secondary | ICD-10-CM | POA: Diagnosis present

## 2017-02-12 DIAGNOSIS — I2510 Atherosclerotic heart disease of native coronary artery without angina pectoris: Secondary | ICD-10-CM | POA: Diagnosis present

## 2017-02-12 DIAGNOSIS — I10 Essential (primary) hypertension: Secondary | ICD-10-CM | POA: Diagnosis present

## 2017-02-12 DIAGNOSIS — J449 Chronic obstructive pulmonary disease, unspecified: Secondary | ICD-10-CM | POA: Diagnosis present

## 2017-02-12 DIAGNOSIS — G40909 Epilepsy, unspecified, not intractable, without status epilepticus: Secondary | ICD-10-CM

## 2017-02-12 HISTORY — PX: ORIF ACETABULAR FRACTURE: SHX5029

## 2017-02-12 LAB — BASIC METABOLIC PANEL
Anion gap: 10 (ref 5–15)
BUN: 10 mg/dL (ref 6–20)
CALCIUM: 8.8 mg/dL — AB (ref 8.9–10.3)
CO2: 24 mmol/L (ref 22–32)
CREATININE: 1.04 mg/dL (ref 0.61–1.24)
Chloride: 102 mmol/L (ref 101–111)
GFR calc non Af Amer: >60 mL/min (ref >60)
GLUCOSE: 126 mg/dL — AB (ref 65–99)
Potassium: 3.7 mmol/L (ref 3.5–5.1)
Sodium: 136 mmol/L (ref 135–145)

## 2017-02-12 LAB — CBC WITH DIFFERENTIAL/PLATELET
BASOS PCT: 0 %
Basophils Absolute: 0 10*3/uL (ref 0.0–0.1)
Eosinophils Absolute: 0 10*3/uL (ref 0.0–0.7)
Eosinophils Relative: 0 %
HEMATOCRIT: 36.5 % — AB (ref 39.0–52.0)
Hemoglobin: 12.3 g/dL — ABNORMAL LOW (ref 13.0–17.0)
Lymphocytes Relative: 6 %
Lymphs Abs: 0.8 10*3/uL (ref 0.7–4.0)
MCH: 32.3 pg (ref 26.0–34.0)
MCHC: 33.7 g/dL (ref 30.0–36.0)
MCV: 95.8 fL (ref 78.0–100.0)
MONO ABS: 0.9 10*3/uL (ref 0.1–1.0)
MONOS PCT: 6 %
NEUTROS ABS: 12.3 10*3/uL — AB (ref 1.7–7.7)
Neutrophils Relative %: 88 %
Platelets: 246 10*3/uL (ref 150–400)
RBC: 3.81 MIL/uL — ABNORMAL LOW (ref 4.22–5.81)
RDW: 13.4 % (ref 11.5–15.5)
WBC: 14 10*3/uL — ABNORMAL HIGH (ref 4.0–10.5)

## 2017-02-12 LAB — TYPE AND SCREEN
ABO/RH(D): A POS
Antibody Screen: NEGATIVE

## 2017-02-12 LAB — ABO/RH: ABO/RH(D): A POS

## 2017-02-12 LAB — SURGICAL PCR SCREEN
MRSA, PCR: NEGATIVE
Staphylococcus aureus: NEGATIVE

## 2017-02-12 SURGERY — OPEN REDUCTION INTERNAL FIXATION (ORIF) ACETABULAR FRACTURE
Anesthesia: General | Site: Pelvis | Laterality: Left

## 2017-02-12 MED ORDER — CEFAZOLIN SODIUM-DEXTROSE 1-4 GM/50ML-% IV SOLN
1.0000 g | Freq: Three times a day (TID) | INTRAVENOUS | Status: AC
  Administered 2017-02-12 – 2017-02-15 (×9): 1 g via INTRAVENOUS
  Filled 2017-02-12 (×9): qty 50

## 2017-02-12 MED ORDER — LACTATED RINGERS IV SOLN
INTRAVENOUS | Status: DC
  Administered 2017-02-12 – 2017-02-14 (×4): via INTRAVENOUS

## 2017-02-12 MED ORDER — SUGAMMADEX SODIUM 200 MG/2ML IV SOLN
INTRAVENOUS | Status: DC | PRN
  Administered 2017-02-12: 200 mg via INTRAVENOUS

## 2017-02-12 MED ORDER — PROPOFOL 10 MG/ML IV BOLUS
INTRAVENOUS | Status: DC | PRN
  Administered 2017-02-12: 110 mg via INTRAVENOUS

## 2017-02-12 MED ORDER — EPHEDRINE 5 MG/ML INJ
INTRAVENOUS | Status: AC
  Filled 2017-02-12: qty 10

## 2017-02-12 MED ORDER — ONDANSETRON HCL 4 MG/2ML IJ SOLN
INTRAMUSCULAR | Status: AC
  Filled 2017-02-12: qty 2

## 2017-02-12 MED ORDER — 0.9 % SODIUM CHLORIDE (POUR BTL) OPTIME
TOPICAL | Status: DC | PRN
  Administered 2017-02-12: 1000 mL

## 2017-02-12 MED ORDER — SUCCINYLCHOLINE CHLORIDE 200 MG/10ML IV SOSY
PREFILLED_SYRINGE | INTRAVENOUS | Status: AC
  Filled 2017-02-12: qty 10

## 2017-02-12 MED ORDER — HYDROMORPHONE HCL 1 MG/ML IJ SOLN
0.2500 mg | INTRAMUSCULAR | Status: DC | PRN
  Administered 2017-02-12 (×3): 0.5 mg via INTRAVENOUS

## 2017-02-12 MED ORDER — HYDROMORPHONE HCL 1 MG/ML IJ SOLN
1.0000 mg | INTRAMUSCULAR | Status: DC | PRN
  Administered 2017-02-12 – 2017-02-13 (×4): 1 mg via INTRAVENOUS
  Filled 2017-02-12 (×4): qty 1

## 2017-02-12 MED ORDER — LORAZEPAM 2 MG/ML IJ SOLN: 2.0000 mg | Freq: Once | INTRAMUSCULAR | Status: DC

## 2017-02-12 MED ORDER — DEXAMETHASONE SODIUM PHOSPHATE 10 MG/ML IJ SOLN
INTRAMUSCULAR | Status: DC | PRN
  Administered 2017-02-12: 8 mg via INTRAVENOUS

## 2017-02-12 MED ORDER — EPHEDRINE SULFATE 50 MG/ML IJ SOLN
INTRAMUSCULAR | Status: DC | PRN
  Administered 2017-02-12: 15 mg via INTRAVENOUS

## 2017-02-12 MED ORDER — PHENYLEPHRINE 40 MCG/ML (10ML) SYRINGE FOR IV PUSH (FOR BLOOD PRESSURE SUPPORT)
PREFILLED_SYRINGE | INTRAVENOUS | Status: AC
  Filled 2017-02-12: qty 10

## 2017-02-12 MED ORDER — FENTANYL CITRATE (PF) 100 MCG/2ML IJ SOLN
50.0000 ug | INTRAMUSCULAR | Status: AC | PRN
  Administered 2017-02-12 (×2): 50 ug via INTRAVENOUS
  Filled 2017-02-12 (×2): qty 2

## 2017-02-12 MED ORDER — HYDROMORPHONE HCL 1 MG/ML IJ SOLN
INTRAMUSCULAR | Status: AC
  Administered 2017-02-12: 0.5 mg via INTRAVENOUS
  Filled 2017-02-12: qty 1

## 2017-02-12 MED ORDER — PHENYLEPHRINE HCL 10 MG/ML IJ SOLN
INTRAVENOUS | Status: DC | PRN
  Administered 2017-02-12: 40 ug/min via INTRAVENOUS

## 2017-02-12 MED ORDER — ONDANSETRON HCL 4 MG PO TABS: 4.0000 mg | ORAL_TABLET | Freq: Four times a day (QID) | ORAL | Status: DC | PRN

## 2017-02-12 MED ORDER — FENTANYL CITRATE (PF) 100 MCG/2ML IJ SOLN
100.0000 ug | Freq: Once | INTRAMUSCULAR | Status: AC
  Administered 2017-02-12: 100 ug via INTRAVENOUS

## 2017-02-12 MED ORDER — ROCURONIUM BROMIDE 10 MG/ML (PF) SYRINGE
PREFILLED_SYRINGE | INTRAVENOUS | Status: AC
  Filled 2017-02-12: qty 10

## 2017-02-12 MED ORDER — ACETAMINOPHEN 500 MG PO TABS
500.0000 mg | ORAL_TABLET | Freq: Four times a day (QID) | ORAL | Status: DC | PRN
  Administered 2017-02-13 – 2017-02-18 (×3): 1000 mg via ORAL
  Filled 2017-02-12 (×3): qty 2

## 2017-02-12 MED ORDER — GLYCOPYRROLATE 0.2 MG/ML IV SOSY
PREFILLED_SYRINGE | INTRAVENOUS | Status: DC | PRN
  Administered 2017-02-12: .2 mg via INTRAVENOUS

## 2017-02-12 MED ORDER — AMOXICILLIN-POT CLAVULANATE 875-125 MG PO TABS
1.0000 | ORAL_TABLET | Freq: Two times a day (BID) | ORAL | Status: DC
  Administered 2017-02-15 – 2017-02-20 (×10): 1 via ORAL
  Filled 2017-02-12 (×10): qty 1

## 2017-02-12 MED ORDER — MIDAZOLAM HCL 2 MG/2ML IJ SOLN
INTRAMUSCULAR | Status: AC
  Filled 2017-02-12: qty 2

## 2017-02-12 MED ORDER — ENOXAPARIN SODIUM 40 MG/0.4ML ~~LOC~~ SOLN
40.0000 mg | SUBCUTANEOUS | Status: DC
  Administered 2017-02-15 – 2017-02-20 (×6): 40 mg via SUBCUTANEOUS
  Filled 2017-02-12 (×7): qty 0.4

## 2017-02-12 MED ORDER — VANCOMYCIN HCL 1000 MG IV SOLR
INTRAVENOUS | Status: DC | PRN
  Administered 2017-02-12: 1000 mg via TOPICAL

## 2017-02-12 MED ORDER — METOCLOPRAMIDE HCL 5 MG PO TABS: 5.0000 mg | ORAL_TABLET | Freq: Three times a day (TID) | ORAL | Status: DC | PRN

## 2017-02-12 MED ORDER — POLYETHYLENE GLYCOL 3350 17 G PO PACK: 17.0000 g | PACK | Freq: Every day | ORAL | Status: DC

## 2017-02-12 MED ORDER — MIDAZOLAM HCL 5 MG/5ML IJ SOLN
INTRAMUSCULAR | Status: DC | PRN
Start: 2017-02-12 — End: 2017-02-12
  Administered 2017-02-12: 2 mg via INTRAVENOUS

## 2017-02-12 MED ORDER — PROPOFOL 10 MG/ML IV BOLUS
INTRAVENOUS | Status: AC
  Filled 2017-02-12: qty 40

## 2017-02-12 MED ORDER — ONDANSETRON HCL 4 MG/2ML IJ SOLN: 4.0000 mg | Freq: Four times a day (QID) | INTRAMUSCULAR | Status: DC | PRN

## 2017-02-12 MED ORDER — ONDANSETRON HCL 4 MG/2ML IJ SOLN
INTRAMUSCULAR | Status: DC | PRN
  Administered 2017-02-12: 4 mg via INTRAVENOUS

## 2017-02-12 MED ORDER — ACETAMINOPHEN 160 MG/5ML PO SOLN: 325.0000 mg | ORAL | Status: DC | PRN

## 2017-02-12 MED ORDER — LIDOCAINE HCL (CARDIAC) 20 MG/ML IV SOLN
INTRAVENOUS | Status: DC | PRN
  Administered 2017-02-12: 100 mg via INTRAVENOUS

## 2017-02-12 MED ORDER — FENTANYL CITRATE (PF) 250 MCG/5ML IJ SOLN
INTRAMUSCULAR | Status: AC
  Filled 2017-02-12: qty 5

## 2017-02-12 MED ORDER — INFLUENZA VAC SPLIT QUAD 0.5 ML IM SUSY
0.5000 mL | PREFILLED_SYRINGE | INTRAMUSCULAR | Status: AC
  Administered 2017-02-18: 0.5 mL via INTRAMUSCULAR
  Filled 2017-02-12: qty 0.5

## 2017-02-12 MED ORDER — LIDOCAINE 2% (20 MG/ML) 5 ML SYRINGE
INTRAMUSCULAR | Status: AC
  Filled 2017-02-12: qty 5

## 2017-02-12 MED ORDER — OXYCODONE HCL 5 MG/5ML PO SOLN: 5.0000 mg | Freq: Once | ORAL | Status: DC | PRN

## 2017-02-12 MED ORDER — ACETAMINOPHEN 325 MG PO TABS: 325.0000 mg | ORAL_TABLET | ORAL | Status: DC | PRN

## 2017-02-12 MED ORDER — OXYCODONE HCL 5 MG PO TABS
ORAL_TABLET | ORAL | Status: AC
  Administered 2017-02-12: 15 mg via ORAL
  Filled 2017-02-12: qty 3

## 2017-02-12 MED ORDER — POTASSIUM CHLORIDE IN NACL 20-0.9 MEQ/L-% IV SOLN: INTRAVENOUS | Status: DC

## 2017-02-12 MED ORDER — SODIUM CHLORIDE 0.9 % IR SOLN
Status: DC | PRN
  Administered 2017-02-12: 3000 mL

## 2017-02-12 MED ORDER — FENTANYL CITRATE (PF) 100 MCG/2ML IJ SOLN
INTRAMUSCULAR | Status: AC
  Administered 2017-02-12: 100 ug via INTRAVENOUS
  Filled 2017-02-12: qty 2

## 2017-02-12 MED ORDER — HYDRALAZINE HCL 20 MG/ML IJ SOLN
INTRAMUSCULAR | Status: AC
  Filled 2017-02-12: qty 1

## 2017-02-12 MED ORDER — ROCURONIUM BROMIDE 100 MG/10ML IV SOLN
INTRAVENOUS | Status: DC | PRN
  Administered 2017-02-12: 50 mg via INTRAVENOUS
  Administered 2017-02-12 (×2): 10 mg via INTRAVENOUS
  Administered 2017-02-12: 20 mg via INTRAVENOUS
  Administered 2017-02-12 (×2): 10 mg via INTRAVENOUS
  Administered 2017-02-12 (×2): 20 mg via INTRAVENOUS

## 2017-02-12 MED ORDER — PNEUMOCOCCAL VAC POLYVALENT 25 MCG/0.5ML IJ INJ
0.5000 mL | INJECTION | INTRAMUSCULAR | Status: AC
  Administered 2017-02-18: 0.5 mL via INTRAMUSCULAR
  Filled 2017-02-12: qty 0.5

## 2017-02-12 MED ORDER — OXYCODONE HCL 5 MG PO TABS: 5.0000 mg | ORAL_TABLET | Freq: Once | ORAL | Status: DC | PRN

## 2017-02-12 MED ORDER — METOCLOPRAMIDE HCL 5 MG/ML IJ SOLN: 5.0000 mg | Freq: Three times a day (TID) | INTRAMUSCULAR | Status: DC | PRN

## 2017-02-12 MED ORDER — VANCOMYCIN HCL 1000 MG IV SOLR
INTRAVENOUS | Status: AC
  Filled 2017-02-12: qty 1000

## 2017-02-12 MED ORDER — DOCUSATE SODIUM 100 MG PO CAPS
100.0000 mg | ORAL_CAPSULE | Freq: Two times a day (BID) | ORAL | Status: DC
  Administered 2017-02-12 – 2017-02-19 (×14): 100 mg via ORAL
  Filled 2017-02-12 (×14): qty 1

## 2017-02-12 MED ORDER — ACETAMINOPHEN 650 MG RE SUPP: 650.0000 mg | Freq: Four times a day (QID) | RECTAL | Status: DC | PRN

## 2017-02-12 MED ORDER — FENTANYL CITRATE (PF) 100 MCG/2ML IJ SOLN
INTRAMUSCULAR | Status: DC | PRN
  Administered 2017-02-12: 100 ug via INTRAVENOUS
  Administered 2017-02-12 (×6): 50 ug via INTRAVENOUS

## 2017-02-12 MED ORDER — ALUM & MAG HYDROXIDE-SIMETH 200-200-20 MG/5ML PO SUSP
30.0000 mL | ORAL | Status: DC | PRN
  Administered 2017-02-12: 30 mL via ORAL
  Filled 2017-02-12: qty 30

## 2017-02-12 SURGICAL SUPPLY — 85 items
ADH SKN CLS APL DERMABOND .7 (GAUZE/BANDAGES/DRESSINGS) ×1
BIT DRILL AO MATTA 2.5MX230M (BIT) IMPLANT
BIT DRILL SCALD PELVS II 2.5MM (BIT) IMPLANT
BIT DRILL STEP 3.5 (DRILL) IMPLANT
BLADE CLIPPER SURG (BLADE) ×2 IMPLANT
BNDG GAUZE ELAST 4 BULKY (GAUZE/BANDAGES/DRESSINGS) ×2 IMPLANT
BRUSH SCRUB SURG 4.25 DISP (MISCELLANEOUS) ×4 IMPLANT
CHLORAPREP W/TINT 26ML (MISCELLANEOUS) ×3 IMPLANT
CLOSURE STERI-STRIP 1/2X4 (GAUZE/BANDAGES/DRESSINGS) ×1
CLOSURE WOUND 1/2 X4 (GAUZE/BANDAGES/DRESSINGS)
CLSR STERI-STRIP ANTIMIC 1/2X4 (GAUZE/BANDAGES/DRESSINGS) ×1 IMPLANT
COVER SURGICAL LIGHT HANDLE (MISCELLANEOUS) ×3 IMPLANT
DERMABOND ADVANCED (GAUZE/BANDAGES/DRESSINGS) ×2
DERMABOND ADVANCED .7 DNX12 (GAUZE/BANDAGES/DRESSINGS) IMPLANT
DRAPE C-ARM 42X72 X-RAY (DRAPES) ×3 IMPLANT
DRAPE C-ARMOR (DRAPES) ×3 IMPLANT
DRAPE INCISE IOBAN 66X45 STRL (DRAPES) ×3 IMPLANT
DRAPE INCISE IOBAN 85X60 (DRAPES) ×3 IMPLANT
DRAPE ORTHO SPLIT 77X108 STRL (DRAPES) ×6
DRAPE SURG ORHT 6 SPLT 77X108 (DRAPES) ×2 IMPLANT
DRAPE U-SHAPE 47X51 STRL (DRAPES) ×3 IMPLANT
DRILL BIT AO MATTA 2.5MX230M (BIT) ×3
DRILL SCALED PELVIS II 2.5MM (BIT) ×3
DRILL STEP 3.5 (DRILL)
DRSG ADAPTIC 3X8 NADH LF (GAUZE/BANDAGES/DRESSINGS) ×2 IMPLANT
DRSG MEPILEX BORDER 4X12 (GAUZE/BANDAGES/DRESSINGS) IMPLANT
DRSG MEPILEX BORDER 4X4 (GAUZE/BANDAGES/DRESSINGS) ×4 IMPLANT
DRSG MEPILEX BORDER 4X8 (GAUZE/BANDAGES/DRESSINGS) ×2 IMPLANT
ELECT BLADE 6.5 EXT (BLADE) ×3 IMPLANT
ELECT CAUTERY BLADE 6.4 (BLADE) ×2 IMPLANT
ELECT REM PT RETURN 9FT ADLT (ELECTROSURGICAL) ×3
ELECTRODE REM PT RTRN 9FT ADLT (ELECTROSURGICAL) ×1 IMPLANT
GAUZE SPONGE 4X4 12PLY STRL (GAUZE/BANDAGES/DRESSINGS) ×2 IMPLANT
GLOVE BIO SURGEON STRL SZ7.5 (GLOVE) ×14 IMPLANT
GLOVE BIOGEL PI IND STRL 7.0 (GLOVE) IMPLANT
GLOVE BIOGEL PI IND STRL 7.5 (GLOVE) ×1 IMPLANT
GLOVE BIOGEL PI INDICATOR 7.0 (GLOVE) ×2
GLOVE BIOGEL PI INDICATOR 7.5 (GLOVE) ×6
GLOVE SURG SS PI 6.5 STRL IVOR (GLOVE) ×4 IMPLANT
GLOVE SURG SS PI 7.5 STRL IVOR (GLOVE) ×6 IMPLANT
GOWN STRL REUS W/ TWL LRG LVL3 (GOWN DISPOSABLE) ×2 IMPLANT
GOWN STRL REUS W/TWL LRG LVL3 (GOWN DISPOSABLE) ×9
HANDPIECE INTERPULSE COAX TIP (DISPOSABLE) ×3
K-WIRE 3.2X150 (WIRE) ×6
KIT BASIN OR (CUSTOM PROCEDURE TRAY) ×3 IMPLANT
KIT ROOM TURNOVER OR (KITS) ×3 IMPLANT
KWIRE 3.2X150 (WIRE) IMPLANT
MANIFOLD NEPTUNE II (INSTRUMENTS) ×3 IMPLANT
NDL MAYO TROCAR (NEEDLE) IMPLANT
NEEDLE MAYO TROCAR (NEEDLE) ×3 IMPLANT
NS IRRIG 1000ML POUR BTL (IV SOLUTION) ×3 IMPLANT
PACK TOTAL JOINT (CUSTOM PROCEDURE TRAY) ×3 IMPLANT
PACK UNIVERSAL I (CUSTOM PROCEDURE TRAY) ×2 IMPLANT
PAD ARMBOARD 7.5X6 YLW CONV (MISCELLANEOUS) ×6 IMPLANT
PLATE SUPRAPECTINEAL L (Plate) ×2 IMPLANT
RETRIEVER SUT HEWSON (MISCELLANEOUS) ×1 IMPLANT
SCREW CORTEX ST MATTA 3.5X28MM (Screw) ×2 IMPLANT
SCREW CORTEX ST MATTA 3.5X34MM (Screw) ×4 IMPLANT
SCREW CORTEX ST MATTA 3.5X36MM (Screw) ×4 IMPLANT
SCREW CORTEX ST MATTA 3.5X40MM (Screw) ×2 IMPLANT
SCREW CORTEX ST MATTA 3.5X45MM (Screw) ×4 IMPLANT
SCREW CORTEX ST MATTA 3.5X70MM (Screw) ×6 IMPLANT
SET HNDPC FAN SPRY TIP SCT (DISPOSABLE) ×1 IMPLANT
SPONGE LAP 18X18 X RAY DECT (DISPOSABLE) ×2 IMPLANT
STAPLER VISISTAT 35W (STAPLE) ×3 IMPLANT
STRIP CLOSURE SKIN 1/2X4 (GAUZE/BANDAGES/DRESSINGS) ×1 IMPLANT
SUCTION FRAZIER HANDLE 10FR (MISCELLANEOUS)
SUCTION TUBE FRAZIER 10FR DISP (MISCELLANEOUS) ×1 IMPLANT
SUT ETHILON 2 0 PSLX (SUTURE) ×2 IMPLANT
SUT FIBERWIRE #2 38 T-5 BLUE (SUTURE)
SUT MNCRL AB 3-0 PS2 18 (SUTURE) ×3 IMPLANT
SUT MON AB 2-0 CT1 36 (SUTURE) ×1 IMPLANT
SUT VIC AB 0 CT1 27 (SUTURE)
SUT VIC AB 0 CT1 27XBRD ANBCTR (SUTURE) ×1 IMPLANT
SUT VIC AB 1 CT1 18XCR BRD 8 (SUTURE) ×1 IMPLANT
SUT VIC AB 1 CT1 27 (SUTURE) ×3
SUT VIC AB 1 CT1 27XBRD ANBCTR (SUTURE) ×1 IMPLANT
SUT VIC AB 1 CT1 8-18 (SUTURE) ×3
SUT VIC AB 2-0 CT1 27 (SUTURE) ×6
SUT VIC AB 2-0 CT1 TAPERPNT 27 (SUTURE) ×1 IMPLANT
SUTURE FIBERWR #2 38 T-5 BLUE (SUTURE) ×2 IMPLANT
TOWEL OR 17X24 6PK STRL BLUE (TOWEL DISPOSABLE) ×6 IMPLANT
TOWEL OR 17X26 10 PK STRL BLUE (TOWEL DISPOSABLE) ×6 IMPLANT
TRAY FOLEY W/METER SILVER 16FR (SET/KITS/TRAYS/PACK) ×2 IMPLANT
WATER STERILE IRR 1000ML POUR (IV SOLUTION) IMPLANT

## 2017-02-12 NOTE — Progress Notes (Signed)
Riyansh is stable this morning.  Plans are for ORIF of left his acetabulum and possible closure of RLE wound with Dr. Jena Gauss today.  Appreciate his assistance.  Mayra Reel, MD [facility] [phone] 7:29 AM
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
## 2017-02-12 NOTE — Transfer of Care (Signed)
Immediate Anesthesia Transfer of Care Note  Patient: Steven [name]  Procedure(s) Performed: OPEN REDUCTION INTERNAL FIXATION (ORIF) ACETABULAR FRACTURE (Left Pelvis)  Patient Location: PACU  Anesthesia Type:General  Level of Consciousness: awake, alert  and oriented  Airway & Oxygen Therapy: Patient Spontanous Breathing and Patient connected to nasal cannula oxygen  Post-op Assessment: Report given to RN and Post -op Vital signs reviewed and stable  Post vital signs: Reviewed and stable  Last Vitals:  Vitals:   02/12/17 0447 02/12/17 0600  BP: 135/85   Pulse: 89   Resp:  20  Temp: 37.2 C   SpO2: 93% 94%    Last Pain:  Vitals:   02/12/17 1000  TempSrc:   PainSc: 10-Worst pain ever      Patients Stated Pain Goal: 0 (02/11/17 1020)  Complications: No apparent anesthesia complications

## 2017-02-12 NOTE — Progress Notes (Addendum)
Patient not seen 02/12/17, was in the OR when I attempted to round. Will F/U 02/13/17.   Steven [name]

## 2017-02-12 NOTE — Anesthesia Postprocedure Evaluation (Signed)
Anesthesia Post Note  Patient: Steven [name]  Procedure(s) Performed: OPEN REDUCTION INTERNAL FIXATION (ORIF) ACETABULAR FRACTURE (Left Pelvis)     Patient location during evaluation: PACU Anesthesia Type: General Level of consciousness: awake and alert Pain management: pain level controlled Vital Signs Assessment: post-procedure vital signs reviewed and stable Respiratory status: spontaneous breathing, nonlabored ventilation, respiratory function stable and patient connected to nasal cannula oxygen Cardiovascular status: blood pressure returned to baseline and stable Postop Assessment: no apparent nausea or vomiting Anesthetic complications: no    Last Vitals:  Vitals:   02/12/17 1745 02/12/17 1747  BP: (!) 187/100 (!) 187/100  Pulse: (!) 115   Resp: 16   Temp:    SpO2: 96%     Last Pain:  Vitals:   02/12/17 1730  TempSrc:   PainSc: 8                  Sandy Haye DAVID

## 2017-02-12 NOTE — Anesthesia Preprocedure Evaluation (Addendum)
Anesthesia Evaluation  Patient identified by MRN, date of birth, ID band Patient awake    Reviewed: Allergy & Precautions, NPO status , Patient's Chart, lab work & pertinent test results  History of Anesthesia Complications Negative for: history of anesthetic complications  Airway Mallampati: II  TM Distance: >3 FB Neck ROM: Full    Dental  (+) Dental Advisory Given, Chipped, Missing, Loose, Poor Dentition   Pulmonary COPD, Current Smoker,    breath sounds clear to auscultation       Cardiovascular hypertension, Pt. on medications + CAD, + Past MI and + Peripheral Vascular Disease   Rhythm:Regular Rate:Normal     Neuro/Psych Seizures -, Well Controlled,  CVA, No Residual Symptoms negative psych ROS   GI/Hepatic negative GI ROS, Neg liver ROS,   Endo/Other  negative endocrine ROS  Renal/GU negative Renal ROS     Musculoskeletal   Abdominal   Peds  Hematology  (+) anemia ,   Anesthesia Other Findings AAA stented  Reproductive/Obstetrics                            Anesthesia Physical Anesthesia Plan  ASA: III  Anesthesia Plan: General   Post-op Pain Management:    Induction: Intravenous  PONV Risk Score and Plan: 1 and Ondansetron and Dexamethasone  Airway Management Planned: Oral ETT  Additional Equipment: None  Intra-op Plan:   Post-operative Plan: Extubation in OR  Informed Consent: I have reviewed the patients History and Physical, chart, labs and discussed the procedure including the risks, benefits and alternatives for the proposed anesthesia with the patient or authorized representative who has indicated his/her understanding and acceptance.   Dental advisory given  Plan Discussed with: CRNA, Surgeon and Anesthesiologist  Anesthesia Plan Comments:        Anesthesia Quick Evaluation

## 2017-02-12 NOTE — Progress Notes (Signed)
Patient transferred from PACU with RN. Vitals stable at this time, pt reports 10/10 pain to right leg. Wound vac continuous at 125, left leg in traction-20 lbs per order. Pt has strong palpable pedal pulses to both lower extremities. Will continue to monitor.

## 2017-02-13 ENCOUNTER — Encounter (HOSPITAL_COMMUNITY): Payer: Self-pay | Admitting: Student

## 2017-02-13 ENCOUNTER — Inpatient Hospital Stay (HOSPITAL_COMMUNITY): Payer: No Typology Code available for payment source

## 2017-02-13 DIAGNOSIS — Z9889 Other specified postprocedural states: Secondary | ICD-10-CM

## 2017-02-13 DIAGNOSIS — G40909 Epilepsy, unspecified, not intractable, without status epilepticus: Secondary | ICD-10-CM

## 2017-02-13 DIAGNOSIS — S32402A Unspecified fracture of left acetabulum, initial encounter for closed fracture: Principal | ICD-10-CM

## 2017-02-13 DIAGNOSIS — S81001D Unspecified open wound, right knee, subsequent encounter: Secondary | ICD-10-CM

## 2017-02-13 DIAGNOSIS — J449 Chronic obstructive pulmonary disease, unspecified: Secondary | ICD-10-CM

## 2017-02-13 DIAGNOSIS — S069X9A Unspecified intracranial injury with loss of consciousness of unspecified duration, initial encounter: Secondary | ICD-10-CM

## 2017-02-13 DIAGNOSIS — S81811D Laceration without foreign body, right lower leg, subsequent encounter: Secondary | ICD-10-CM

## 2017-02-13 DIAGNOSIS — S86921D Laceration of unspecified muscle(s) and tendon(s) at lower leg level, right leg, subsequent encounter: Secondary | ICD-10-CM

## 2017-02-13 DIAGNOSIS — I10 Essential (primary) hypertension: Secondary | ICD-10-CM

## 2017-02-13 DIAGNOSIS — E669 Obesity, unspecified: Secondary | ICD-10-CM

## 2017-02-13 LAB — CBC
HCT: 33.8 % — ABNORMAL LOW (ref 39.0–52.0)
Hemoglobin: 11 g/dL — ABNORMAL LOW (ref 13.0–17.0)
MCH: 31.5 pg (ref 26.0–34.0)
MCHC: 32.5 g/dL (ref 30.0–36.0)
MCV: 96.8 fL (ref 78.0–100.0)
PLATELETS: 256 10*3/uL (ref 150–400)
RBC: 3.49 MIL/uL — AB (ref 4.22–5.81)
RDW: 13.9 % (ref 11.5–15.5)
WBC: 14.7 10*3/uL — AB (ref 4.0–10.5)

## 2017-02-13 LAB — BASIC METABOLIC PANEL
ANION GAP: 10 (ref 5–15)
BUN: 21 mg/dL — AB (ref 6–20)
CALCIUM: 8.7 mg/dL — AB (ref 8.9–10.3)
CO2: 25 mmol/L (ref 22–32)
Chloride: 100 mmol/L — ABNORMAL LOW (ref 101–111)
Creatinine, Ser: 1.53 mg/dL — ABNORMAL HIGH (ref 0.61–1.24)
GFR calc Af Amer: >60 mL/min (ref >60)
GFR, EST NON AFRICAN AMERICAN: 52 mL/min — AB (ref >60)
GLUCOSE: 115 mg/dL — AB (ref 65–99)
Potassium: 4.3 mmol/L (ref 3.5–5.1)
SODIUM: 135 mmol/L (ref 135–145)

## 2017-02-13 MED ORDER — TRAMADOL HCL 50 MG PO TABS
100.0000 mg | ORAL_TABLET | Freq: Four times a day (QID) | ORAL | Status: DC
  Administered 2017-02-13 – 2017-02-20 (×26): 100 mg via ORAL
  Filled 2017-02-13 (×26): qty 2

## 2017-02-13 MED ORDER — CHLORPROMAZINE HCL 25 MG PO TABS
25.0000 mg | ORAL_TABLET | Freq: Four times a day (QID) | ORAL | Status: DC
  Administered 2017-02-13 – 2017-02-17 (×12): 25 mg via ORAL
  Filled 2017-02-13 (×18): qty 1

## 2017-02-13 MED ORDER — LEVALBUTEROL HCL 0.63 MG/3ML IN NEBU
0.6300 mg | INHALATION_SOLUTION | Freq: Four times a day (QID) | RESPIRATORY_TRACT | Status: DC | PRN
  Administered 2017-02-14: 0.63 mg via RESPIRATORY_TRACT
  Filled 2017-02-13: qty 3

## 2017-02-13 MED ORDER — HYDROMORPHONE HCL 1 MG/ML IJ SOLN
2.0000 mg | INTRAMUSCULAR | Status: DC | PRN
  Administered 2017-02-13 – 2017-02-16 (×18): 2 mg via INTRAVENOUS
  Filled 2017-02-13 (×18): qty 2

## 2017-02-13 MED ORDER — PANTOPRAZOLE SODIUM 40 MG PO TBEC
80.0000 mg | DELAYED_RELEASE_TABLET | Freq: Two times a day (BID) | ORAL | Status: DC
  Administered 2017-02-13 – 2017-02-20 (×14): 80 mg via ORAL
  Filled 2017-02-13 (×14): qty 2

## 2017-02-13 MED ORDER — NICOTINE 21 MG/24HR TD PT24
21.0000 mg | MEDICATED_PATCH | Freq: Once | TRANSDERMAL | Status: DC
  Administered 2017-02-13: 21 mg via TRANSDERMAL
  Filled 2017-02-13 (×2): qty 1

## 2017-02-13 MED ORDER — ALBUTEROL SULFATE (2.5 MG/3ML) 0.083% IN NEBU: 2.5000 mg | INHALATION_SOLUTION | RESPIRATORY_TRACT | Status: DC | PRN

## 2017-02-13 MED ORDER — NICOTINE 21 MG/24HR TD PT24
21.0000 mg | MEDICATED_PATCH | Freq: Every day | TRANSDERMAL | Status: DC
  Administered 2017-02-13 – 2017-02-20 (×9): 21 mg via TRANSDERMAL
  Filled 2017-02-13 (×8): qty 1

## 2017-02-13 MED ORDER — BETHANECHOL CHLORIDE 25 MG PO TABS
25.0000 mg | ORAL_TABLET | Freq: Four times a day (QID) | ORAL | Status: DC
  Administered 2017-02-13 – 2017-02-17 (×15): 25 mg via ORAL
  Filled 2017-02-13 (×15): qty 1

## 2017-02-13 NOTE — Anesthesia Preprocedure Evaluation (Addendum)
Anesthesia Evaluation  Patient identified by MRN, date of birth, ID band Patient awake    Reviewed: Allergy & Precautions, NPO status , Patient's Chart, lab work & pertinent test results  History of Anesthesia Complications Negative for: history of anesthetic complications  Airway Mallampati: III  TM Distance: >3 FB Neck ROM: Full    Dental  (+) Dental Advisory Given, Chipped, Missing, Loose, Poor Dentition   Pulmonary COPD, Current Smoker,    breath sounds clear to auscultation       Cardiovascular hypertension, Pt. on medications + CAD, + Past MI and + Peripheral Vascular Disease   Rhythm:Regular Rate:Normal     Neuro/Psych Seizures -, Well Controlled,  CVA, No Residual Symptoms negative psych ROS   GI/Hepatic negative GI ROS, Neg liver ROS,   Endo/Other  negative endocrine ROS  Renal/GU negative Renal ROS     Musculoskeletal   Abdominal   Peds  Hematology  (+) anemia ,   Anesthesia Other Findings AAA stented  Reproductive/Obstetrics                            Anesthesia Physical  Anesthesia Plan  ASA: III  Anesthesia Plan: General   Post-op Pain Management:    Induction: Intravenous  PONV Risk Score and Plan: 2 and Ondansetron, Dexamethasone and Treatment may vary due to age or medical condition  Airway Management Planned: LMA  Additional Equipment: None  Intra-op Plan:   Post-operative Plan: Extubation in OR  Informed Consent: I have reviewed the patients History and Physical, chart, labs and discussed the procedure including the risks, benefits and alternatives for the proposed anesthesia with the patient or authorized representative who has indicated his/her understanding and acceptance.   Dental advisory given  Plan Discussed with: CRNA  Anesthesia Plan Comments:         Anesthesia Quick Evaluation

## 2017-02-13 NOTE — Progress Notes (Signed)
Orthopaedic Trauma Progress Note  S: Pain not well controlled with movement. States that feels sharp pain in hip. Foley removed was able to void but with difficulty  O:  Vitals:   02/13/17 0007 02/13/17 0546  BP: 139/81 132/80  Pulse: (!) 107 92  Resp: 20 20  Temp: 98.8 F (37.1 C) 98.3 F (36.8 C)  SpO2: 96% 100%  Pelvis: Notable swelling over suprapubic region. Dressing is clean dry and intact. Neurovascularly intact distally  Imaging: None performed yet  Labs: Hgb 11  A/P: POD1 s/p ORIF left acetabulum, POD2 I&D and wound vac right lower leg  -TDWB LLE, WBAT RLE -Plan to return to OR tomorrow for closure of RLE wound -NPO past midnight -Lovenox for DVT -PT/OT  Roby Lofts, MD [facility] [phone] (phone)
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
## 2017-02-13 NOTE — Progress Notes (Signed)
See Dr. Rosalyn Charters consult. SNF is recommended at this time. We will sign off. I have alerted RN CM and SW. [phone]

## 2017-02-13 NOTE — Progress Notes (Signed)
Rehab Admissions Coordinator Note:  Patient was screened by Clois Dupes for appropriateness for an Inpatient Acute Rehab Consult per PT recommendation. OT recommends SNF.Marland Kitchen  At this time, we are recommending Inpatient Rehab consult.  Clois Dupes 02/13/2017, 12:51 PM  I can be reached at [phone].
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
## 2017-02-13 NOTE — Progress Notes (Signed)
Patient ID: Steven [name], male   DOB: 1970/09/18, 46 y.o.   MRN: [id number]  Called by CT saying pt persistently refusing scans. Pt is having issues with N/V such that he is worried that he will vomit and aspirate on table. Also having issues with hiccoughs and urinary retention today. Will increase PPI and add thorazine to help with hiccoughs and N/V. Added urecholine and prn I&O cath for retention. Will also add scheduled tramadol to see if he can cut his narcotic usage as he feels this is a large part of the problem. However, he feels it's a problem with the oxycodone/hydrocodone products and did well in past with oral Dilaudid. Will have CT try again this evening if he's feeling better.    Freeman Caldron, PA-C Orthopedic Surgery [phone]
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
## 2017-02-14 ENCOUNTER — Inpatient Hospital Stay (HOSPITAL_COMMUNITY): Payer: No Typology Code available for payment source | Admitting: Anesthesiology

## 2017-02-14 ENCOUNTER — Encounter (HOSPITAL_COMMUNITY)
Admission: EM | Disposition: A | Discharge status: Skilled Nursing Facility | Payer: Self-pay | Source: Home / Self Care | Attending: Internal Medicine

## 2017-02-14 ENCOUNTER — Encounter (HOSPITAL_COMMUNITY): Payer: Self-pay | Admitting: *Deleted

## 2017-02-14 DIAGNOSIS — N5089 Other specified disorders of the male genital organs: Secondary | ICD-10-CM

## 2017-02-14 DIAGNOSIS — D62 Acute posthemorrhagic anemia: Secondary | ICD-10-CM

## 2017-02-14 HISTORY — PX: SECONDARY CLOSURE OF WOUND: SHX6208

## 2017-02-14 LAB — BASIC METABOLIC PANEL
Anion gap: 10 (ref 5–15)
BUN: 22 mg/dL — AB (ref 6–20)
CALCIUM: 8.9 mg/dL (ref 8.9–10.3)
CO2: 27 mmol/L (ref 22–32)
CREATININE: 1.16 mg/dL (ref 0.61–1.24)
Chloride: 97 mmol/L — ABNORMAL LOW (ref 101–111)
GLUCOSE: 104 mg/dL — AB (ref 65–99)
Potassium: 3.5 mmol/L (ref 3.5–5.1)
SODIUM: 134 mmol/L — AB (ref 135–145)

## 2017-02-14 LAB — CBC
HCT: 31.4 % — ABNORMAL LOW (ref 39.0–52.0)
HEMOGLOBIN: 10.2 g/dL — AB (ref 13.0–17.0)
MCH: 31.5 pg (ref 26.0–34.0)
MCHC: 32.5 g/dL (ref 30.0–36.0)
MCV: 96.9 fL (ref 78.0–100.0)
PLATELETS: 239 10*3/uL (ref 150–400)
RBC: 3.24 MIL/uL — ABNORMAL LOW (ref 4.22–5.81)
RDW: 13.5 % (ref 11.5–15.5)
WBC: 10.4 10*3/uL (ref 4.0–10.5)

## 2017-02-14 SURGERY — SECONDARY CLOSURE OF WOUND
Anesthesia: General | Site: Leg Lower | Laterality: Right

## 2017-02-14 MED ORDER — FENTANYL CITRATE (PF) 250 MCG/5ML IJ SOLN
INTRAMUSCULAR | Status: AC
  Filled 2017-02-14: qty 5

## 2017-02-14 MED ORDER — PROPOFOL 10 MG/ML IV BOLUS
INTRAVENOUS | Status: AC
  Filled 2017-02-14: qty 20

## 2017-02-14 MED ORDER — MIDAZOLAM HCL 2 MG/2ML IJ SOLN
INTRAMUSCULAR | Status: AC
  Filled 2017-02-14: qty 2

## 2017-02-14 MED ORDER — FUROSEMIDE 40 MG PO TABS
40.0000 mg | ORAL_TABLET | Freq: Once | ORAL | Status: AC
  Administered 2017-02-14: 40 mg via ORAL
  Filled 2017-02-14: qty 1

## 2017-02-14 MED ORDER — LIDOCAINE 2% (20 MG/ML) 5 ML SYRINGE
INTRAMUSCULAR | Status: DC | PRN
  Administered 2017-02-14: 60 mg via INTRAVENOUS

## 2017-02-14 MED ORDER — FENTANYL CITRATE (PF) 100 MCG/2ML IJ SOLN
25.0000 ug | INTRAMUSCULAR | Status: DC | PRN
  Administered 2017-02-14 (×2): 50 ug via INTRAVENOUS

## 2017-02-14 MED ORDER — PROMETHAZINE HCL 25 MG/ML IJ SOLN: 6.2500 mg | INTRAMUSCULAR | Status: DC | PRN

## 2017-02-14 MED ORDER — ONDANSETRON HCL 4 MG/2ML IJ SOLN
INTRAMUSCULAR | Status: AC
  Filled 2017-02-14: qty 2

## 2017-02-14 MED ORDER — 0.9 % SODIUM CHLORIDE (POUR BTL) OPTIME
TOPICAL | Status: DC | PRN
  Administered 2017-02-14: 1000 mL

## 2017-02-14 MED ORDER — BACITRACIN ZINC 500 UNIT/GM EX OINT
TOPICAL_OINTMENT | CUTANEOUS | Status: AC
  Filled 2017-02-14: qty 28.35

## 2017-02-14 MED ORDER — CEFAZOLIN SODIUM-DEXTROSE 1-4 GM/50ML-% IV SOLN
INTRAVENOUS | Status: DC | PRN
  Administered 2017-02-14: 1 g via INTRAVENOUS

## 2017-02-14 MED ORDER — FENTANYL CITRATE (PF) 100 MCG/2ML IJ SOLN
INTRAMUSCULAR | Status: DC | PRN
  Administered 2017-02-14: 50 ug via INTRAVENOUS

## 2017-02-14 MED ORDER — PROPOFOL 10 MG/ML IV BOLUS
INTRAVENOUS | Status: DC | PRN
  Administered 2017-02-14: 150 mg via INTRAVENOUS

## 2017-02-14 MED ORDER — FENTANYL CITRATE (PF) 100 MCG/2ML IJ SOLN
INTRAMUSCULAR | Status: AC
  Administered 2017-02-14: 50 ug via INTRAVENOUS
  Filled 2017-02-14: qty 2

## 2017-02-14 MED ORDER — LIDOCAINE 2% (20 MG/ML) 5 ML SYRINGE
INTRAMUSCULAR | Status: AC
  Filled 2017-02-14: qty 5

## 2017-02-14 MED ORDER — PHENYLEPHRINE HCL 10 MG/ML IJ SOLN
INTRAMUSCULAR | Status: DC | PRN
  Administered 2017-02-14: 160 ug via INTRAVENOUS
  Administered 2017-02-14 (×3): 80 ug via INTRAVENOUS

## 2017-02-14 MED ORDER — ONDANSETRON HCL 4 MG/2ML IJ SOLN
INTRAMUSCULAR | Status: DC | PRN
  Administered 2017-02-14: 4 mg via INTRAVENOUS

## 2017-02-14 MED ORDER — MIDAZOLAM HCL 2 MG/2ML IJ SOLN
INTRAMUSCULAR | Status: DC | PRN
  Administered 2017-02-14: 2 mg via INTRAVENOUS

## 2017-02-14 SURGICAL SUPPLY — 49 items
BANDAGE ACE 4X5 VEL STRL LF (GAUZE/BANDAGES/DRESSINGS) ×2 IMPLANT
BANDAGE ACE 6X5 VEL STRL LF (GAUZE/BANDAGES/DRESSINGS) ×2 IMPLANT
BLADE SURG 10 STRL SS (BLADE) ×3 IMPLANT
BNDG COHESIVE 4X5 TAN STRL (GAUZE/BANDAGES/DRESSINGS) ×3 IMPLANT
BNDG GAUZE ELAST 4 BULKY (GAUZE/BANDAGES/DRESSINGS) ×6 IMPLANT
BNDG GAUZE STRTCH 6 (GAUZE/BANDAGES/DRESSINGS) ×9 IMPLANT
BRUSH SCRUB SURG 4.25 DISP (MISCELLANEOUS) ×2 IMPLANT
CHLORAPREP W/TINT 26ML (MISCELLANEOUS) ×5 IMPLANT
COVER SURGICAL LIGHT HANDLE (MISCELLANEOUS) ×6 IMPLANT
DRAPE C-ARMOR (DRAPES) ×1 IMPLANT
DRAPE U-SHAPE 47X51 STRL (DRAPES) ×3 IMPLANT
DRSG ADAPTIC 3X8 NADH LF (GAUZE/BANDAGES/DRESSINGS) ×3 IMPLANT
ELECT CAUTERY BLADE 6.4 (BLADE) IMPLANT
ELECT REM PT RETURN 9FT ADLT (ELECTROSURGICAL) ×3
ELECTRODE REM PT RTRN 9FT ADLT (ELECTROSURGICAL) IMPLANT
GAUZE SPONGE 4X4 12PLY STRL (GAUZE/BANDAGES/DRESSINGS) ×3 IMPLANT
GAUZE SPONGE 4X4 12PLY STRL LF (GAUZE/BANDAGES/DRESSINGS) ×2 IMPLANT
GLOVE BIO SURGEON STRL SZ7.5 (GLOVE) ×12 IMPLANT
GLOVE BIOGEL PI IND STRL 7.0 (GLOVE) IMPLANT
GLOVE BIOGEL PI IND STRL 7.5 (GLOVE) ×1 IMPLANT
GLOVE BIOGEL PI INDICATOR 7.0 (GLOVE) ×6
GLOVE BIOGEL PI INDICATOR 7.5 (GLOVE) ×2
GOWN STRL REUS W/ TWL LRG LVL3 (GOWN DISPOSABLE) ×2 IMPLANT
GOWN STRL REUS W/TWL LRG LVL3 (GOWN DISPOSABLE) ×6
HANDPIECE INTERPULSE COAX TIP (DISPOSABLE)
KIT BASIN OR (CUSTOM PROCEDURE TRAY) ×3 IMPLANT
KIT PREVENA INCISION MGT20CM45 (CANNISTER) ×2 IMPLANT
KIT ROOM TURNOVER OR (KITS) ×3 IMPLANT
MANIFOLD NEPTUNE II (INSTRUMENTS) ×1 IMPLANT
NS IRRIG 1000ML POUR BTL (IV SOLUTION) ×3 IMPLANT
PACK ORTHO EXTREMITY (CUSTOM PROCEDURE TRAY) ×3 IMPLANT
PAD ARMBOARD 7.5X6 YLW CONV (MISCELLANEOUS) ×6 IMPLANT
PAD NEG PRESSURE SENSATRAC (MISCELLANEOUS) ×2 IMPLANT
PADDING CAST COTTON 6X4 STRL (CAST SUPPLIES) ×3 IMPLANT
SET HNDPC FAN SPRY TIP SCT (DISPOSABLE) IMPLANT
SPONGE LAP 18X18 X RAY DECT (DISPOSABLE) ×3 IMPLANT
STOCKINETTE IMPERVIOUS 9X36 MD (GAUZE/BANDAGES/DRESSINGS) ×3 IMPLANT
SUT ETHILON 2 0 FS 18 (SUTURE) ×6 IMPLANT
SUT PDS AB 0 CT 36 (SUTURE) ×4 IMPLANT
SUT PDS AB 2-0 CT1 27 (SUTURE) IMPLANT
SWAB CULTURE ESWAB REG 1ML (MISCELLANEOUS) IMPLANT
TOWEL OR 17X24 6PK STRL BLUE (TOWEL DISPOSABLE) ×1 IMPLANT
TOWEL OR 17X26 10 PK STRL BLUE (TOWEL DISPOSABLE) ×6 IMPLANT
TUBE CONNECTING 12'X1/4 (SUCTIONS) ×1
TUBE CONNECTING 12X1/4 (SUCTIONS) ×2 IMPLANT
UNDERPAD 30X30 (UNDERPADS AND DIAPERS) ×3 IMPLANT
WATER STERILE IRR 1000ML POUR (IV SOLUTION) ×1 IMPLANT
WND VAC CANISTER 500ML (MISCELLANEOUS) ×2 IMPLANT
YANKAUER SUCT BULB TIP NO VENT (SUCTIONS) ×1 IMPLANT
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
## 2017-02-14 NOTE — Progress Notes (Signed)
Pt had a c/o shortness of breath requesting a breathing treatment O2 STATs 97% on room air Dinamap Lungs diminished at bases. He also requested s of Dilaudid IV at the same time. Patient had received  given 15 mg Oxi IR about 30-45 min's earlier. I explained that breathing is priority and RT was there to give breathing treatment and informed that I would return with the IV pain medicine after he felt that his breathing was better. He was upset I reassured him that he would get his pain medication. Ilean Skill LPN.
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
## 2017-02-14 NOTE — Anesthesia Postprocedure Evaluation (Signed)
Anesthesia Post Note  Patient: Steven [name]  Procedure(s) Performed: SECONDARY CLOSURE OF WOUND (Right Leg Lower)     Patient location during evaluation: PACU Anesthesia Type: General Level of consciousness: awake and alert Pain management: pain level controlled Vital Signs Assessment: post-procedure vital signs reviewed and stable Respiratory status: spontaneous breathing, nonlabored ventilation, respiratory function stable and patient connected to nasal cannula oxygen Cardiovascular status: blood pressure returned to baseline and stable Postop Assessment: no apparent nausea or vomiting Anesthetic complications: no    Last Vitals:  Vitals:   02/14/17 1145 02/14/17 1200  BP: (!) 149/80 (!) 146/86  Pulse:    Resp: 18 20  Temp: 36.8 C   SpO2: 92%     Last Pain:  Vitals:   02/14/17 1145  TempSrc: Oral  PainSc:                  [name]

## 2017-02-14 NOTE — Transfer of Care (Signed)
Immediate Anesthesia Transfer of Care Note  Patient: CRUZ BONG  Procedure(s) Performed: SECONDARY CLOSURE OF WOUND (Right Leg Lower)  Patient Location: PACU  Anesthesia Type:General  Level of Consciousness: drowsy  Airway & Oxygen Therapy: Patient Spontanous Breathing and Patient connected to nasal cannula oxygen  Post-op Assessment: Report given to RN and Post -op Vital signs reviewed and stable  Post vital signs: Reviewed and stable  Last Vitals:  Vitals:   02/13/17 1436 02/13/17 2014  BP: (!) 130/95 135/67  Pulse: 69 91  Resp: 16 16  Temp: 36.8 C (!) 36.4 C  SpO2: 98% 98%    Last Pain:  Vitals:   02/14/17 0337  TempSrc:   PainSc: Asleep      Patients Stated Pain Goal: 3 (02/14/17 0307)  Complications: No apparent anesthesia complications

## 2017-02-15 ENCOUNTER — Encounter (HOSPITAL_COMMUNITY): Payer: Self-pay | Admitting: Student

## 2017-02-15 ENCOUNTER — Inpatient Hospital Stay (HOSPITAL_COMMUNITY): Payer: No Typology Code available for payment source

## 2017-02-15 DIAGNOSIS — D649 Anemia, unspecified: Secondary | ICD-10-CM

## 2017-02-15 LAB — BASIC METABOLIC PANEL
ANION GAP: 12 (ref 5–15)
BUN: 20 mg/dL (ref 6–20)
CHLORIDE: 98 mmol/L — AB (ref 101–111)
CO2: 27 mmol/L (ref 22–32)
Calcium: 8.7 mg/dL — ABNORMAL LOW (ref 8.9–10.3)
Creatinine, Ser: 1.13 mg/dL (ref 0.61–1.24)
GFR calc non Af Amer: >60 mL/min (ref >60)
Glucose, Bld: 133 mg/dL — ABNORMAL HIGH (ref 65–99)
POTASSIUM: 3.7 mmol/L (ref 3.5–5.1)
SODIUM: 137 mmol/L (ref 135–145)

## 2017-02-15 LAB — CBC
HEMATOCRIT: 29.9 % — AB (ref 39.0–52.0)
HEMOGLOBIN: 10 g/dL — AB (ref 13.0–17.0)
MCH: 32.5 pg (ref 26.0–34.0)
MCHC: 33.4 g/dL (ref 30.0–36.0)
MCV: 97.1 fL (ref 78.0–100.0)
Platelets: 241 10*3/uL (ref 150–400)
RBC: 3.08 MIL/uL — AB (ref 4.22–5.81)
RDW: 13.8 % (ref 11.5–15.5)
WBC: 8.9 10*3/uL (ref 4.0–10.5)

## 2017-02-15 NOTE — Clinical Social Work Note (Signed)
Clinical Social Work Assessment  Patient Details  Name: Steven [name] MRN: [id number] Date of Birth: Oct 26, 1970  Date of referral:  02/15/17               Reason for consult:  Facility Placement                Permission sought to share information with:  Facility Sport and exercise psychologist Permission granted to share information::  Yes, Verbal Permission Granted  Name::     Edrik Rundle, Sr., [phone], father  Agency::  SNF  Relationship::     Contact Information:     Housing/Transportation Living arrangements for the past 2 months:  Apartment Source of Information:  Patient Patient Interpreter Needed:  None Criminal Activity/Legal Involvement Pertinent to Current Situation/Hospitalization:  No - Comment as needed Significant Relationships:  Parents, Friend Lives with:  Self Do you feel safe going back to the place where you live?  No Need for family participation in patient care:  No (Coment)  Care giving concerns:  Pt resides alone in his home. He is unemployed, divorced and has no children. His elderly father, provides him $300 a month in which he pays his rent and monthly expenses. Pt indicated that he is applying for SSDI. He confirms that he would want to go home with outpatient therapy. CSW explained to him that we can only offer therapy in a facility. Pt indicated that he will not go to any place for therapy if they are not good. CSW explained to him the barriers with selection of SNF's as many of them will not accept him given the fact that he is uninsured. CSW discussed with the potential for a SNF bed out of the local area like [place] or [place]. Pt in agreement with a SNF depending on quality more than location. CSW validated and discussed with him his limitation with being uninsured.  Pt's plan is to return home after short term rehab as he has a stable home.  Social Worker assessment / plan:  CSW met with patient and discussed disposition. CSW explained the SNF process for  him as he is uninsured. CSW will assist with finding SNF bed with 30 day LOG.  Employment status:  Disabled (Comment on whether or not currently receiving Disability) Insurance information:  Self Pay (Medicaid Pending) PT Recommendations:  [place] / Referral to community resources:  [place]  Patient/Family's Response to care:  Patient appreciative of CSW assisting with the SNF process. Pt reports no issues with care at this time.  Patient/Family's Understanding of and Emotional Response to Diagnosis, Current Treatment, and Prognosis:  Patient has good understanding of his diagnosis, current treatment, and prognosis. Pt desires to return home and understands that he cannot get outpatient services, nor will he have many options with SNF placement due to his insurance barriers. Pt hopeful that he will get a good SNF to transition to for rehab. No other issues identified at this time. CSW will continue to support transition to SNF with LOG.  Emotional Assessment Appearance:  Appears stated age Attitude/Demeanor/Rapport:   (Cooperative) Affect (typically observed):  Accepting, Appropriate Orientation:  Oriented to Situation, Oriented to  Time, Oriented to Place, Oriented to Self Alcohol / Substance use:  Not Applicable Psych involvement (Current and /or in the community):  No (Comment)  Discharge Needs  Concerns to be addressed:  Care Coordination Readmission within the last 30 days:  No Current discharge risk:  Dependent with Mobility, Physical Impairment  Barriers to Discharge:  No Barriers Identified   Normajean Baxter, LCSW 02/15/2017, 1:10 PM
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
## 2017-02-15 NOTE — Progress Notes (Signed)
Orthopaedic Trauma Progress Note  S: Pain doing better today. Complaining of swelling in testicle area. No pain in hip. Having difficulty coughing  O:  Vitals:   02/15/17 0120 02/15/17 0503  BP: 108/72 120/70  Pulse: 87 75  Resp: 15 16  Temp:  98.2 F (36.8 C)  SpO2: 95% 96%  Pelvis: Notable swelling over suprapubic region and testicles which has improved. Dressing is clean dry and intact. Neurovascularly intact distally  RLE: Incisional vac with good seal. Active dorsiflexion/plantarflexion  Labs: Hgb 10/31.4  A/P: POD3 s/p ORIF left acetabulum, POD1 I&D and closure of wound right lower leg  -TDWB LLE, WBAT RLE -Will take down incisional vac tomorrow -Lovenox for DVT -PT/OT -Dispo to SNF, okay for tomorrow from orthopaedic standpoint  Roby Lofts, MD [facility] [phone] (phone)
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
## 2017-02-15 NOTE — Social Work (Signed)
CSW discussed case with clinical supervisor, Zack and patient is approved for 30day LOG for SNF placement.  CSW sent out offers to local SNF's.   CSW f/u with Pecola Lawless, Lut[facility] Horsfall, [facility] [place] [facility] [place] for bed availability.  CSW will continue to follow.  Keene Breath, LCSW Clinical Social Worker [phone]
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
## 2017-02-15 NOTE — Progress Notes (Signed)
Patient educated about taking PO medications instead of IV. Trying to transition patient in order to be discharged.Patient refused PO medications.

## 2017-02-15 NOTE — Progress Notes (Signed)
Tech offered Pt a bath. Per Pt bath and shave had been done.

## 2017-02-15 NOTE — Social Work (Addendum)
CSW went to meet with patient. Patient indicated he was in too much pain to meet to discuss his disposition and plan.   CSW will f/u in an hour as it is imperative that we discuss plan for discharge.  CSW called financial counselors and they have already met with patient and they are following up. No insurance at this time.  Elissa Hefty, LCSW Clinical Social Worker [phone]

## 2017-02-16 MED ORDER — FUROSEMIDE 40 MG PO TABS
40.0000 mg | ORAL_TABLET | Freq: Once | ORAL | Status: AC
  Administered 2017-02-16: 40 mg via ORAL
  Filled 2017-02-16: qty 1

## 2017-02-16 MED ORDER — HYDROMORPHONE HCL 1 MG/ML IJ SOLN: 1.0000 mg | INTRAMUSCULAR | Status: DC | PRN
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
## 2017-02-16 NOTE — Progress Notes (Signed)
Pt wanting RN to let MD know that his testicles are larger and he is concerned he might "live tonight" RN bladder scanned pt and volume 45cc. Pt has been voiding all day but non complaint with testicle elevation and ice application. MD made aware. Will continue to monitor.

## 2017-02-16 NOTE — Progress Notes (Signed)
Orthopaedic Trauma Progress Note  S: Still complaining of a lot of testicular swelling. Pain in hip doing well. States he has not been as compliant as he should with weightbearing  O:  Vitals:   02/15/17 2009 02/16/17 0432  BP: (!) 129/57 135/70  Pulse: 97 85  Resp: 15 15  Temp: (!) 97.5 F (36.4 C) 97.9 F (36.6 C)  SpO2: 99% 99%  Pelvis: Notable swelling over suprapubic region and testicles which has improved. Dressing is clean dry and intact. Neurovascularly intact distally  RLE: Incisional vac taken down with laceration clean dry and intact, redressed. Active dorsiflexion/plantarflexion  A/P: POD4 s/p ORIF left acetabulum, POD2 I&D and closure of wound right lower leg  -TDWB LLE, WBAT RLE -Daily dressing changes with adaptic, 4x4s and kerlix -Lovenox for DVT -PT/OT -Okay to discharge from orthopaedic perspective  I did discuss with him the importance of weight bearing precautions and the possibility of hardware failure and need for total hip arthroplasty with continued weight bearing. Also discussed with him his addictive personality for previous alcohol abuse and need to monitor narcotic use upon discharge.  Roby Lofts, MD [facility] [phone] (phone)
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
## 2017-02-17 DIAGNOSIS — E876 Hypokalemia: Secondary | ICD-10-CM

## 2017-02-17 LAB — BASIC METABOLIC PANEL
Anion gap: 9 (ref 5–15)
BUN: 12 mg/dL (ref 6–20)
CALCIUM: 8.7 mg/dL — AB (ref 8.9–10.3)
CO2: 31 mmol/L (ref 22–32)
CREATININE: 0.97 mg/dL (ref 0.61–1.24)
Chloride: 95 mmol/L — ABNORMAL LOW (ref 101–111)
GFR calc Af Amer: >60 mL/min (ref >60)
GFR calc non Af Amer: >60 mL/min (ref >60)
GLUCOSE: 104 mg/dL — AB (ref 65–99)
Potassium: 3 mmol/L — ABNORMAL LOW (ref 3.5–5.1)
Sodium: 135 mmol/L (ref 135–145)

## 2017-02-17 LAB — CBC
HCT: 27.1 % — ABNORMAL LOW (ref 39.0–52.0)
HEMOGLOBIN: 8.8 g/dL — AB (ref 13.0–17.0)
MCH: 31 pg (ref 26.0–34.0)
MCHC: 32.5 g/dL (ref 30.0–36.0)
MCV: 95.4 fL (ref 78.0–100.0)
PLATELETS: 274 10*3/uL (ref 150–400)
RBC: 2.84 MIL/uL — ABNORMAL LOW (ref 4.22–5.81)
RDW: 13.4 % (ref 11.5–15.5)
WBC: 6.4 10*3/uL (ref 4.0–10.5)

## 2017-02-17 LAB — MAGNESIUM: MAGNESIUM: 1.7 mg/dL (ref 1.7–2.4)

## 2017-02-17 MED ORDER — FUROSEMIDE 40 MG PO TABS
40.0000 mg | ORAL_TABLET | Freq: Once | ORAL | Status: AC
  Administered 2017-02-17: 40 mg via ORAL
  Filled 2017-02-17: qty 1

## 2017-02-17 MED ORDER — ATORVASTATIN CALCIUM 40 MG PO TABS
40.0000 mg | ORAL_TABLET | Freq: Every day | ORAL | Status: DC
  Administered 2017-02-17 – 2017-02-20 (×4): 40 mg via ORAL
  Filled 2017-02-17 (×4): qty 1

## 2017-02-17 MED ORDER — ARIPIPRAZOLE 5 MG PO TABS
5.0000 mg | ORAL_TABLET | Freq: Every day | ORAL | Status: DC
  Administered 2017-02-17 – 2017-02-20 (×5): 5 mg via ORAL
  Filled 2017-02-17 (×4): qty 1

## 2017-02-17 MED ORDER — HYDROXYZINE HCL 25 MG PO TABS: 25.0000 mg | ORAL_TABLET | Freq: Three times a day (TID) | ORAL | Status: DC | PRN

## 2017-02-17 MED ORDER — HYDROMORPHONE HCL 1 MG/ML IJ SOLN: 0.5000 mg | INTRAMUSCULAR | Status: DC | PRN

## 2017-02-17 MED ORDER — TRAZODONE HCL 50 MG PO TABS
50.0000 mg | ORAL_TABLET | Freq: Every day | ORAL | Status: DC
  Administered 2017-02-17 – 2017-02-19 (×3): 50 mg via ORAL
  Filled 2017-02-17 (×3): qty 1

## 2017-02-17 MED ORDER — FLUOXETINE HCL 20 MG PO CAPS
40.0000 mg | ORAL_CAPSULE | Freq: Every day | ORAL | Status: DC
Start: 2017-02-17 — End: 2017-02-20
  Administered 2017-02-17 – 2017-02-20 (×4): 40 mg via ORAL
  Filled 2017-02-17 (×4): qty 2

## 2017-02-17 MED ORDER — POTASSIUM CHLORIDE CRYS ER 20 MEQ PO TBCR
40.0000 meq | EXTENDED_RELEASE_TABLET | ORAL | Status: AC
  Administered 2017-02-17 (×2): 40 meq via ORAL
  Filled 2017-02-17 (×2): qty 2
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
## 2017-02-17 NOTE — Progress Notes (Signed)
Patient has been non compliant with the touch down weight bearing of this right leg. Patient has been up with walker walking around the room as if nothing is wrong. I have asked that he follow doctors orders and he state he is, but he continues to place full weight on the right leg. This has occurred the entire shift from 0700 to 1900. Steven [name] WhiMadelin RearRN, [employer]

## 2017-02-17 NOTE — Social Work (Signed)
CSW continuing to follow up for SNF placement. CSW called [facility] [place] and was admission staff not available on the weekend. CSW will f/u on Monday as patient has no other bed offers and will need SNF placement at discharge.  Pt will be here until CSW can find safe discharge to SNF.  Keene Breath, LCSW Clinical Social Worker [phone]

## 2017-02-18 DIAGNOSIS — R601 Generalized edema: Secondary | ICD-10-CM

## 2017-02-18 LAB — COMPREHENSIVE METABOLIC PANEL
ALBUMIN: 2.5 g/dL — AB (ref 3.5–5.0)
ALK PHOS: 51 U/L (ref 38–126)
ALT: 14 U/L — ABNORMAL LOW (ref 17–63)
ANION GAP: 8 (ref 5–15)
AST: 28 U/L (ref 15–41)
BUN: 10 mg/dL (ref 6–20)
CALCIUM: 8.5 mg/dL — AB (ref 8.9–10.3)
CO2: 34 mmol/L — AB (ref 22–32)
Chloride: 96 mmol/L — ABNORMAL LOW (ref 101–111)
Creatinine, Ser: 0.97 mg/dL (ref 0.61–1.24)
GFR calc Af Amer: >60 mL/min (ref >60)
GFR calc non Af Amer: >60 mL/min (ref >60)
GLUCOSE: 124 mg/dL — AB (ref 65–99)
Potassium: 3.5 mmol/L (ref 3.5–5.1)
SODIUM: 138 mmol/L (ref 135–145)
Total Bilirubin: 0.5 mg/dL (ref 0.3–1.2)
Total Protein: 5.6 g/dL — ABNORMAL LOW (ref 6.5–8.1)

## 2017-02-18 LAB — CBC
HEMATOCRIT: 26.6 % — AB (ref 39.0–52.0)
HEMOGLOBIN: 8.5 g/dL — AB (ref 13.0–17.0)
MCH: 30.8 pg (ref 26.0–34.0)
MCHC: 32 g/dL (ref 30.0–36.0)
MCV: 96.4 fL (ref 78.0–100.0)
Platelets: 289 10*3/uL (ref 150–400)
RBC: 2.76 MIL/uL — AB (ref 4.22–5.81)
RDW: 13.6 % (ref 11.5–15.5)
WBC: 7 10*3/uL (ref 4.0–10.5)

## 2017-02-18 MED ORDER — ACETAMINOPHEN 500 MG PO TABS
1000.0000 mg | ORAL_TABLET | Freq: Three times a day (TID) | ORAL | Status: DC
  Administered 2017-02-18 – 2017-02-20 (×6): 1000 mg via ORAL
  Filled 2017-02-18 (×8): qty 2

## 2017-02-18 MED ORDER — POTASSIUM CHLORIDE CRYS ER 20 MEQ PO TBCR
40.0000 meq | EXTENDED_RELEASE_TABLET | Freq: Once | ORAL | Status: AC
  Administered 2017-02-18: 40 meq via ORAL
  Filled 2017-02-18: qty 2

## 2017-02-18 MED ORDER — IBUPROFEN 200 MG PO TABS: 600.0000 mg | ORAL_TABLET | Freq: Four times a day (QID) | ORAL | Status: DC | PRN

## 2017-02-18 MED ORDER — POTASSIUM CHLORIDE CRYS ER 20 MEQ PO TBCR
40.0000 meq | EXTENDED_RELEASE_TABLET | Freq: Every day | ORAL | Status: AC
  Administered 2017-02-18 – 2017-02-19 (×2): 40 meq via ORAL
  Filled 2017-02-18 (×2): qty 2

## 2017-02-18 MED ORDER — FUROSEMIDE 40 MG PO TABS
40.0000 mg | ORAL_TABLET | Freq: Every day | ORAL | Status: AC
  Administered 2017-02-18 – 2017-02-19 (×2): 40 mg via ORAL
  Filled 2017-02-18 (×2): qty 1
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
## 2017-02-19 LAB — BASIC METABOLIC PANEL
ANION GAP: 9 (ref 5–15)
BUN: 9 mg/dL (ref 6–20)
CHLORIDE: 96 mmol/L — AB (ref 101–111)
CO2: 31 mmol/L (ref 22–32)
Calcium: 8.7 mg/dL — ABNORMAL LOW (ref 8.9–10.3)
Creatinine, Ser: 0.99 mg/dL (ref 0.61–1.24)
GFR calc Af Amer: >60 mL/min (ref >60)
GLUCOSE: 126 mg/dL — AB (ref 65–99)
POTASSIUM: 3.7 mmol/L (ref 3.5–5.1)
Sodium: 136 mmol/L (ref 135–145)

## 2017-02-19 MED ORDER — POLYETHYLENE GLYCOL 3350 17 G PO PACK
17.0000 g | PACK | Freq: Two times a day (BID) | ORAL | Status: DC
  Administered 2017-02-20: 17 g via ORAL
  Filled 2017-02-19 (×2): qty 1

## 2017-02-19 MED ORDER — SENNA 8.6 MG PO TABS
2.0000 | ORAL_TABLET | Freq: Every day | ORAL | Status: DC
  Administered 2017-02-19 – 2017-02-20 (×2): 17.2 mg via ORAL
  Filled 2017-02-19 (×2): qty 2

## 2017-02-19 MED ORDER — FUROSEMIDE 40 MG PO TABS
40.0000 mg | ORAL_TABLET | Freq: Two times a day (BID) | ORAL | Status: AC
  Administered 2017-02-19 – 2017-02-20 (×2): 40 mg via ORAL
  Filled 2017-02-19 (×2): qty 1

## 2017-02-19 MED ORDER — MAGNESIUM CITRATE PO SOLN
1.0000 | Freq: Once | ORAL | Status: AC
  Administered 2017-02-19: 1 via ORAL
  Filled 2017-02-19: qty 296

## 2017-02-19 NOTE — Progress Notes (Signed)
Patient refuses to have the bed alarm turned on and will not call for assistance to the bathroom. Patient has been educated abut fall risk.

## 2017-02-19 NOTE — Social Work (Signed)
CSW made contact with Steven [name] in admissions at [facility]. SNF has accepted patient for SNF placement and is f/u with smoking policy as patient is on the nicotine patch. CSW discussed with patient and he indicated he will be fine with the non-smoking as long as he has his nicotine patch. CSW awaiting call back from SNF.  Pt indicated that he has transportation to SNF as his elderly father will transport him.  CSW will f/u with clinical staff.   3:57pm: SNF called back and indicated that they can take patient however they will need order for nicotine patch as the SNF is a non-smoking facility and they have been having many struggles with managing the non-smoking policy. CSW will f/u on same.  Keene Breath, LCSW Clinical Social Worker [phone]

## 2017-02-19 NOTE — Social Work (Signed)
CSW called [facility] to follow up on SNF placement.   CSW left message for Stanton Kidney in admissions.  CSW will continue to follow for disposition.  Keene Breath, LCSW Clinical Social Worker [phone]

## 2017-02-19 NOTE — Social Work (Signed)
CSW met with patient at bedside and let him know that CSW is still working on SNF placement.  Pt amenable and indicated that whenever he gets a bed, his dad will transport him because dad will need to get his personal items.  Elissa Hefty, LCSW Clinical Social Worker [phone]
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
## 2017-02-20 ENCOUNTER — Encounter (HOSPITAL_COMMUNITY): Payer: Self-pay | Admitting: Oncology

## 2017-02-20 LAB — CBC
HCT: 29.1 % — ABNORMAL LOW (ref 39.0–52.0)
HEMOGLOBIN: 9.4 g/dL — AB (ref 13.0–17.0)
MCH: 31 pg (ref 26.0–34.0)
MCHC: 32.3 g/dL (ref 30.0–36.0)
MCV: 96 fL (ref 78.0–100.0)
PLATELETS: 327 10*3/uL (ref 150–400)
RBC: 3.03 MIL/uL — ABNORMAL LOW (ref 4.22–5.81)
RDW: 13.4 % (ref 11.5–15.5)
WBC: 7.5 10*3/uL (ref 4.0–10.5)

## 2017-02-20 LAB — BASIC METABOLIC PANEL
Anion gap: 10 (ref 5–15)
BUN: 9 mg/dL (ref 6–20)
CALCIUM: 9.2 mg/dL (ref 8.9–10.3)
CO2: 33 mmol/L — AB (ref 22–32)
CREATININE: 1.15 mg/dL (ref 0.61–1.24)
Chloride: 94 mmol/L — ABNORMAL LOW (ref 101–111)
GFR calc Af Amer: >60 mL/min (ref >60)
GFR calc non Af Amer: >60 mL/min (ref >60)
GLUCOSE: 104 mg/dL — AB (ref 65–99)
Potassium: 4.1 mmol/L (ref 3.5–5.1)
Sodium: 137 mmol/L (ref 135–145)

## 2017-02-20 MED ORDER — TRAZODONE HCL 50 MG PO TABS: 50.0000 mg | ORAL_TABLET | Freq: Every day | ORAL | 0 refills | Status: DC

## 2017-02-20 MED ORDER — SENNA 8.6 MG PO TABS: 2.0000 | ORAL_TABLET | Freq: Every day | ORAL | 0 refills | Status: DC

## 2017-02-20 MED ORDER — IBUPROFEN 600 MG PO TABS: 600.0000 mg | ORAL_TABLET | Freq: Four times a day (QID) | ORAL | 0 refills | Status: DC | PRN

## 2017-02-20 MED ORDER — NICOTINE 21 MG/24HR TD PT24: 21.0000 mg | MEDICATED_PATCH | Freq: Every day | TRANSDERMAL | 0 refills | Status: DC

## 2017-02-20 MED ORDER — FLUOXETINE HCL 40 MG PO CAPS: 40.0000 mg | ORAL_CAPSULE | Freq: Every day | ORAL | 0 refills | Status: DC

## 2017-02-20 MED ORDER — TRAMADOL HCL 50 MG PO TABS: 100.0000 mg | ORAL_TABLET | Freq: Four times a day (QID) | ORAL | 0 refills | Status: DC

## 2017-02-20 MED ORDER — POLYETHYLENE GLYCOL 3350 17 G PO PACK: 17.0000 g | PACK | Freq: Two times a day (BID) | ORAL | 0 refills | Status: DC

## 2017-02-20 MED ORDER — ARIPIPRAZOLE 5 MG PO TABS: 5.0000 mg | ORAL_TABLET | Freq: Every day | ORAL | 0 refills | Status: DC

## 2017-02-20 MED ORDER — OXYCODONE HCL 5 MG PO TABS: 5.0000 mg | ORAL_TABLET | ORAL | 0 refills | Status: DC | PRN

## 2017-02-20 MED ORDER — FUROSEMIDE 20 MG PO TABS: 20.0000 mg | ORAL_TABLET | ORAL | 0 refills | Status: DC

## 2017-02-20 NOTE — Social Work (Signed)
Clinical Social Worker facilitated patient discharge including contacting patient family and facility to confirm patient discharge plans.  Clinical information faxed to facility and family agreeable with plan.    Family member will  transport patient to [facility].    RN to call [phone] to give report prior to discharge.  Clinical Social Worker will sign off for now as social work intervention is no longer needed. Please consult [place] again if new need arises.  Keene Breath, LCSW Clinical Social Worker [phone]

## 2017-02-20 NOTE — Discharge Summary (Signed)
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 slight patient rotation. The lungs are hypoinflated without evidence of airspace consolidation, edema, sizable pleural effusion, or pneumothorax. The left lateral costophrenic angle was excluded. No acute osseous abnormality is identified. IMPRESSION: No evidence of acute traumatic injury. Electronically Signed   By: Sebastian Ache M.D.   On: 02/11/2017 07:41   Dg Tibia/fibula Right Port  Result Date: 02/11/2017 CLINICAL DATA:  Level 2 trauma.  Lower leg laceration. EXAM: PORTABLE RIGHT TIBIA AND FIBULA - 2 VIEW COMPARISON:  None. FINDINGS: The mineralization and alignment are normal. There is no evidence of acute fracture or dislocation. Deep soft tissue laceration is noted posterolaterally in the mid lower leg. There is associated soft tissue emphysema but no definite foreign body. IMPRESSION: Deep soft tissue laceration.  No acute osseous findings. Electronically Signed   By: Carey Bullocks M.D.   On: 02/11/2017 10:08   Dg C-arm Gt 120 Min  Result Date: 02/12/2017 CLINICAL DATA:  Left acetabular fracture EXAM: DG C-ARM GT 120  MIN; JUDET PELVIS - 3+ VIEW COMPARISON:  02/11/2017 FLUOROSCOPY TIME:  Fluoroscopy Time:  1 minutes 38 seconds Radiation Exposure Index (if provided by the fluoroscopic device): Not available Number of Acquired Spot Images: 17 FINDINGS: Initial images again demonstrate the left acetabular fracture extending into the superior pubic ramus on the left. Subsequent fixation plate is noted with multiple fixation screws. Fracture fragments are in near anatomic alignment. IMPRESSION: ORIF of left acetabular fracture. Electronically Signed   By: Alcide Clever M.D.   On: 02/12/2017 16:55   Dg Femur Portable 1 View Right  Result Date: 02/11/2017 CLINICAL DATA:  Level 2 trauma.  Lower leg laceration. EXAM: RIGHT FEMUR PORTABLE 1 VIEW COMPARISON:  Pelvic CT same date. FINDINGS: AP views of the proximal and distal right thigh are submitted. The mineralization and alignment are normal. There is no evidence of acute fracture or dislocation on single view imaging. The joint spaces are maintained. Contrast material is noted within the urinary bladder. IMPRESSION: No evidence of acute right femur injury on single AP view imaging. Electronically Signed   By: Carey Bullocks M.D.   On: 02/11/2017 10:06    Microbiology: Recent Results (from the past 240 hour(s))  Surgical PCR screen     Status: None   Collection Time: 02/12/17  3:25 AM  Result Value Ref Range Status   MRSA, PCR NEGATIVE NEGATIVE Final   Staphylococcus aureus NEGATIVE NEGATIVE Final    Comment: (NOTE) The Xpert SA Assay (FDA approved for NASAL specimens in patients 14 years of age and older), is one component of a comprehensive surveillance program. It is not intended to diagnose infection nor to guide or monitor treatment.  Labs: Basic Metabolic Panel:  Recent Labs Lab 02/15/17 0524 02/17/17 0448 02/17/17 0742 02/18/17 0456 02/19/17 0437 02/20/17 0524  NA 137 135  --  138 136 137  K 3.7 3.0*  --  3.5 3.7 4.1  CL 98* 95*  --  96* 96*  94*  CO2 27 31  --  34* 31 33*  GLUCOSE 133* 104*  --  124* 126* 104*  BUN 20 12  --  10 9 9   CREATININE 1.13 0.97  --  0.97 0.99 1.15  CALCIUM 8.7* 8.7*  --  8.5* 8.7* 9.2  MG  --   --  1.7  --   --   --    Liver Function Tests:  Recent Labs Lab 02/18/17 0456  AST 28  ALT 14*  ALKPHOS 51  BILITOT 0.5  PROT 5.6*  ALBUMIN 2.5*   No results for input(s): LIPASE, AMYLASE in the last 168 hours. No results for input(s): AMMONIA in the last 168 hours. CBC:  Recent Labs Lab 02/14/17 0805 02/15/17 0524 02/17/17 0448 02/18/17 0456 02/20/17 0524  WBC 10.4 8.9 6.4 7.0 7.5  HGB 10.2* 10.0* 8.8* 8.5* 9.4*  HCT 31.4* 29.9* 27.1* 26.6* 29.1*  MCV 96.9 97.1 95.4 96.4 96.0  PLT 239 241 274 289 327   Cardiac Enzymes: No results for input(s): CKTOTAL, CKMB, CKMBINDEX, TROPONINI in the last 168 hours. BNP: BNP (last 3 results) No results for input(s): BNP in the last 8760 hours.  ProBNP (last 3 results) No results for input(s): PROBNP in the last 8760 hours.  CBG: No results for input(s): GLUCAP in the last 168 hours.     SignedRhetta Mura MD   [facility] 02/20/2017, 10:46 AM

## 2017-02-20 NOTE — Progress Notes (Signed)
Patient ready for discharge to SNF facility. Family providing transportation. All belongings with pt. Discharge information reviewed with pt. Pt demonstrates no distress.

## 2017-02-20 NOTE — Clinical Social Work Placement (Signed)
   CLINICAL SOCIAL WORK PLACEMENT  NOTE  Date:  02/20/2017  Patient Details  Name: Steven [name] MRN: [id number] Date of Birth: 12-23-1970  Clinical Social Work is seeking post-discharge placement for this patient at the Skilled  Nursing Facility level of care (*CSW will initial, date and re-position this form in  chart as items are completed):  Yes   Patient/family provided with [facility] Clinical Social Work Department's list of facilities offering this level of care within the geographic area requested by the patient (or if unable, by the patient's family).  Yes   Patient/family informed of their freedom to choose among providers that offer the needed level of care, that participate in Medicare, Medicaid or managed care program needed by the patient, have an available bed and are willing to accept the patient.  Yes   Patient/family informed of [facility]'s ownership interest in [facility] and [facility], as well as of the fact that they are under no obligation to receive care at these facilities.  PASRR submitted to EDS on       PASRR number received on 02/15/17     Existing PASRR number confirmed on       FL2 transmitted to all facilities in geographic area requested by pt/family on 02/15/17     FL2 transmitted to all facilities within larger geographic area on       Patient informed that his/her managed care company has contracts with or will negotiate with certain facilities, including the following:        Yes   Patient/family informed of bed offers received.  Patient chooses bed at [facility]     Physician recommends and patient chooses bed at      Patient to be transferred to [facility] on 02/20/17.  Patient to be transferred to facility by Family will transport     Patient family notified on 02/20/17 of transfer.  Name of family member notified:  father at bedside     PHYSICIAN Please prepare prescriptions      Additional Comment:    _______________________________________________ Tresa Moore, LCSW 02/20/2017, 11:22 AM

## 2017-03-09 ENCOUNTER — Ambulatory Visit (INDEPENDENT_AMBULATORY_CARE_PROVIDER_SITE_OTHER): Payer: Self-pay | Admitting: Orthopaedic Surgery

## 2017-03-09 DIAGNOSIS — S86921D Laceration of unspecified muscle(s) and tendon(s) at lower leg level, right leg, subsequent encounter: Secondary | ICD-10-CM

## 2017-03-09 DIAGNOSIS — S81811D Laceration without foreign body, right lower leg, subsequent encounter: Secondary | ICD-10-CM

## 2017-03-09 MED ORDER — HYDROCODONE-ACETAMINOPHEN 5-325 MG PO TABS: 1.0000 | ORAL_TABLET | Freq: Two times a day (BID) | ORAL | 0 refills | Status: DC | PRN
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
## 2017-05-02 ENCOUNTER — Emergency Department (HOSPITAL_BASED_OUTPATIENT_CLINIC_OR_DEPARTMENT_OTHER)
Admission: EM | Admit: 2017-05-02 | Discharge: 2017-05-03 | Disposition: A | Discharge status: Other Acute Inpatient Hospital | Payer: Self-pay | Attending: Emergency Medicine | Admitting: Emergency Medicine

## 2017-05-02 ENCOUNTER — Encounter (HOSPITAL_BASED_OUTPATIENT_CLINIC_OR_DEPARTMENT_OTHER): Payer: Self-pay | Admitting: Emergency Medicine

## 2017-05-02 ENCOUNTER — Other Ambulatory Visit: Payer: Self-pay

## 2017-05-02 DIAGNOSIS — Z79899 Other long term (current) drug therapy: Secondary | ICD-10-CM | POA: Insufficient documentation

## 2017-05-02 DIAGNOSIS — K439 Ventral hernia without obstruction or gangrene: Secondary | ICD-10-CM | POA: Insufficient documentation

## 2017-05-02 DIAGNOSIS — Z951 Presence of aortocoronary bypass graft: Secondary | ICD-10-CM | POA: Insufficient documentation

## 2017-05-02 DIAGNOSIS — R45851 Suicidal ideations: Secondary | ICD-10-CM | POA: Insufficient documentation

## 2017-05-02 DIAGNOSIS — F17210 Nicotine dependence, cigarettes, uncomplicated: Secondary | ICD-10-CM | POA: Insufficient documentation

## 2017-05-02 DIAGNOSIS — F1910 Other psychoactive substance abuse, uncomplicated: Secondary | ICD-10-CM

## 2017-05-02 DIAGNOSIS — I10 Essential (primary) hypertension: Secondary | ICD-10-CM | POA: Insufficient documentation

## 2017-05-02 DIAGNOSIS — R109 Unspecified abdominal pain: Secondary | ICD-10-CM | POA: Insufficient documentation

## 2017-05-02 DIAGNOSIS — J449 Chronic obstructive pulmonary disease, unspecified: Secondary | ICD-10-CM | POA: Insufficient documentation

## 2017-05-02 DIAGNOSIS — I252 Old myocardial infarction: Secondary | ICD-10-CM | POA: Insufficient documentation

## 2017-05-02 DIAGNOSIS — G8929 Other chronic pain: Secondary | ICD-10-CM

## 2017-05-02 DIAGNOSIS — I2510 Atherosclerotic heart disease of native coronary artery without angina pectoris: Secondary | ICD-10-CM | POA: Insufficient documentation

## 2017-05-02 DIAGNOSIS — Z7984 Long term (current) use of oral hypoglycemic drugs: Secondary | ICD-10-CM | POA: Insufficient documentation

## 2017-05-02 DIAGNOSIS — Z8673 Personal history of transient ischemic attack (TIA), and cerebral infarction without residual deficits: Secondary | ICD-10-CM | POA: Insufficient documentation

## 2017-05-02 DIAGNOSIS — E7800 Pure hypercholesterolemia, unspecified: Secondary | ICD-10-CM | POA: Insufficient documentation

## 2017-05-02 DIAGNOSIS — F329 Major depressive disorder, single episode, unspecified: Secondary | ICD-10-CM | POA: Insufficient documentation

## 2017-05-02 HISTORY — DX: Other psychoactive substance abuse, uncomplicated: F19.10

## 2017-05-02 HISTORY — DX: Cocaine abuse, uncomplicated: F14.10

## 2017-05-02 HISTORY — DX: Alcohol abuse, uncomplicated: F10.10

## 2017-05-02 LAB — COMPREHENSIVE METABOLIC PANEL
ALBUMIN: 4.1 g/dL (ref 3.5–5.0)
ALK PHOS: 140 U/L — AB (ref 38–126)
ALT: 20 U/L (ref 17–63)
ANION GAP: 10 (ref 5–15)
AST: 32 U/L (ref 15–41)
BUN: 10 mg/dL (ref 6–20)
CALCIUM: 10.1 mg/dL (ref 8.9–10.3)
CO2: 25 mmol/L (ref 22–32)
Chloride: 103 mmol/L (ref 101–111)
Creatinine, Ser: 1 mg/dL (ref 0.61–1.24)
GFR calc Af Amer: >60 mL/min (ref >60)
GFR calc non Af Amer: >60 mL/min (ref >60)
GLUCOSE: 88 mg/dL (ref 65–99)
Potassium: 3.1 mmol/L — ABNORMAL LOW (ref 3.5–5.1)
SODIUM: 138 mmol/L (ref 135–145)
Total Bilirubin: 0.3 mg/dL (ref 0.3–1.2)
Total Protein: 8.1 g/dL (ref 6.5–8.1)

## 2017-05-02 LAB — URINALYSIS, MICROSCOPIC (REFLEX)

## 2017-05-02 LAB — URINALYSIS, ROUTINE W REFLEX MICROSCOPIC
BILIRUBIN URINE: NEGATIVE
GLUCOSE, UA: NEGATIVE mg/dL
HGB URINE DIPSTICK: NEGATIVE
Ketones, ur: NEGATIVE mg/dL
Leukocytes, UA: NEGATIVE
Nitrite: NEGATIVE
PH: 6.5 (ref 5.0–8.0)
Protein, ur: 30 mg/dL — AB
SPECIFIC GRAVITY, URINE: 1.02 (ref 1.005–1.030)

## 2017-05-02 LAB — CBC WITH DIFFERENTIAL/PLATELET
BASOS ABS: 0 10*3/uL (ref 0.0–0.1)
BASOS PCT: 1 %
EOS ABS: 0.4 10*3/uL (ref 0.0–0.7)
Eosinophils Relative: 4 %
HCT: 47.2 % (ref 39.0–52.0)
HEMOGLOBIN: 16.1 g/dL (ref 13.0–17.0)
Lymphocytes Relative: 21 %
Lymphs Abs: 1.7 10*3/uL (ref 0.7–4.0)
MCH: 31.8 pg (ref 26.0–34.0)
MCHC: 34.1 g/dL (ref 30.0–36.0)
MCV: 93.1 fL (ref 78.0–100.0)
MONOS PCT: 8 %
Monocytes Absolute: 0.6 10*3/uL (ref 0.1–1.0)
NEUTROS PCT: 66 %
Neutro Abs: 5.4 10*3/uL (ref 1.7–7.7)
Platelets: 282 10*3/uL (ref 150–400)
RBC: 5.07 MIL/uL (ref 4.22–5.81)
RDW: 16 % — ABNORMAL HIGH (ref 11.5–15.5)
WBC: 8.1 10*3/uL (ref 4.0–10.5)

## 2017-05-02 LAB — RAPID URINE DRUG SCREEN, HOSP PERFORMED
AMPHETAMINES: NOT DETECTED
BARBITURATES: NOT DETECTED
Benzodiazepines: NOT DETECTED
Cocaine: POSITIVE — AB
Opiates: NOT DETECTED
Tetrahydrocannabinol: POSITIVE — AB

## 2017-05-02 LAB — ETHANOL: Alcohol, Ethyl (B): <10 mg/dL (ref <10)

## 2017-05-02 MED ORDER — IBUPROFEN 200 MG PO TABS
600.0000 mg | ORAL_TABLET | Freq: Three times a day (TID) | ORAL | Status: DC | PRN
  Administered 2017-05-02 – 2017-05-03 (×2): 600 mg via ORAL
  Filled 2017-05-02 (×2): qty 1

## 2017-05-02 MED ORDER — ALUM & MAG HYDROXIDE-SIMETH 200-200-20 MG/5ML PO SUSP: 30.0000 mL | Freq: Four times a day (QID) | ORAL | Status: DC | PRN

## 2017-05-02 MED ORDER — FLUOXETINE HCL 40 MG PO CAPS: 40.0000 mg | ORAL_CAPSULE | Freq: Every day | ORAL | Status: DC

## 2017-05-02 MED ORDER — TRAZODONE HCL 50 MG PO TABS
50.0000 mg | ORAL_TABLET | Freq: Every day | ORAL | Status: DC
  Filled 2017-05-02: qty 1

## 2017-05-02 MED ORDER — POTASSIUM CHLORIDE CRYS ER 20 MEQ PO TBCR
40.0000 meq | EXTENDED_RELEASE_TABLET | Freq: Once | ORAL | Status: AC
  Administered 2017-05-02: 40 meq via ORAL
  Filled 2017-05-02: qty 2

## 2017-05-02 MED ORDER — METFORMIN HCL 500 MG PO TABS: 500.0000 mg | ORAL_TABLET | Freq: Every day | ORAL | Status: DC

## 2017-05-02 MED ORDER — ATORVASTATIN CALCIUM 40 MG PO TABS
40.0000 mg | ORAL_TABLET | Freq: Every day | ORAL | Status: DC
  Filled 2017-05-02: qty 1

## 2017-05-02 MED ORDER — ONDANSETRON HCL 8 MG PO TABS: 4.0000 mg | ORAL_TABLET | Freq: Three times a day (TID) | ORAL | Status: DC | PRN

## 2017-05-02 MED ORDER — NICOTINE 21 MG/24HR TD PT24
21.0000 mg | MEDICATED_PATCH | Freq: Every day | TRANSDERMAL | Status: DC
  Administered 2017-05-02 – 2017-05-03 (×2): 21 mg via TRANSDERMAL
  Filled 2017-05-02 (×2): qty 1

## 2017-05-02 MED ORDER — AMLODIPINE BESYLATE 5 MG PO TABS
10.0000 mg | ORAL_TABLET | Freq: Every day | ORAL | Status: DC
  Administered 2017-05-03: 10 mg via ORAL
  Filled 2017-05-02: qty 2

## 2017-05-02 MED ORDER — ARIPIPRAZOLE 5 MG PO TABS
5.0000 mg | ORAL_TABLET | Freq: Every day | ORAL | Status: DC
  Filled 2017-05-02: qty 1

## 2017-05-02 NOTE — ED Notes (Signed)
Has finished with tts

## 2017-05-02 NOTE — ED Notes (Signed)
During screening ?s pt states that he does feel like killing himself with plan to inject heroin-explained to pt change in care involved with suicidal pts-pt states understanding/agreeable
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
## 2017-05-02 NOTE — ED Notes (Signed)
Arrived ems w lower abd pain and swelling for weeks,  Is incont of urine and stool at times,  Out of depression med  Also si off and on x 2 weeks because of abd pain,  Has not contacted any md for help
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
## 2017-05-03 MED ORDER — ACETAMINOPHEN 325 MG PO TABS
650.0000 mg | ORAL_TABLET | Freq: Once | ORAL | Status: AC
  Administered 2017-05-03: 650 mg via ORAL
  Filled 2017-05-03: qty 2

## 2017-05-03 NOTE — ED Notes (Signed)
[facility] SW for another number to call report on patient at [facility]

## 2017-05-03 NOTE — ED Notes (Signed)
Patient given chicken noodle soup, saltine crackers and coke to drink for lunch. Per patient request

## 2017-05-03 NOTE — Progress Notes (Signed)
Patient meets criteria for inpatient treatment. CSW faxed referrals to the following inpatient facilities for review:  [facility], [facility], [facility], [facility], [facility], [facility], [facility], [facility], [facility], [facility], [facility], [facility]   TTS will continue to seek bed placement.   Riyan Gavina, MSW, LCSWA Clinical Social Worker (Disposition) [facility]  [phone]   

## 2017-05-03 NOTE — ED Notes (Signed)
Pt requesting something for pain. MD made aware. Orders received. See MAR.

## 2017-05-03 NOTE — ED Notes (Signed)
[facility] called for transport for patient at 1 pm.

## 2017-05-03 NOTE — ED Notes (Signed)
[facility].

## 2017-05-03 NOTE — ED Notes (Signed)
Attempted to call report to [facility]. Rang busy.

## 2017-05-03 NOTE — [facility] Counselor (Signed)
Re-assessment:   Patient report, "he's hurting like hell." Patient discussed pain due to medical conditions related to his pelvis and hernia. Expressed the hernia is getting bigger and bigger. Patient showed TTS writer his hernia expressing it's the size of a football. Discussed other medical problems as a result such difficult urinating, inability and continuation of pain that does not stop. Patient state, "I am going to get some relief one way or another.   Patient continues to endorse suicidal thoughts with a plan to overdose on heroin. Report he has never tired or abused heroin but he has connects to get the substance to overdose. Patient report triggers associated with suicidal feelings include being in pain with no relief. Patient expressed, "I just want to go to sleep and never wake up."   Denies HI, AH and VH.   Per Assunta FoundShuvon Rankin, NP, patient continues to meet inpatient criteria. Recommend inpatient. TTS to seek placement.

## 2017-05-03 NOTE — ED Notes (Signed)
Spoke with personnel [facility] concerning [facility]. Staff will call SW to attempt to get patient placed today.

## 2017-05-03 NOTE — ED Notes (Signed)
Pt has left with [facility] transport at this time to [facility].

## 2017-05-03 NOTE — ED Notes (Signed)
Vitals were taken at 1220, prior to patient leaving.

## 2017-05-03 NOTE — ED Notes (Signed)
TTS eval in place at this time.

## 2017-05-03 NOTE — ED Notes (Signed)
Pt requesting food. Pt given meal options, requesting chicken parmesan. Meal heated for patient.

## 2017-05-03 NOTE — ED Notes (Signed)
Attempt to call report again. Phone continues to ring busy.

## 2017-05-03 NOTE — ED Notes (Signed)
[facility] been [facility]. Pt's accepting and attending [facility]. [facility]. Report number: [phone]. Pt can arrive at 2pm today. MD and Amy, charge RN made aware.

## 2017-05-03 NOTE — ED Notes (Signed)
Attempted to call report, no answer

## 2017-05-03 NOTE — ED Notes (Signed)
Sitter at bedside.

## 2017-05-03 NOTE — Progress Notes (Signed)
Accepted to [facility] [place]   Accepting/Attending physician is Dr. Estill Cottahomas Cornwall.  [facility] is going to the [facility] Nurse to Nurse report is [phone] Patient can arrive at 2:00pm.    Mervyn GayAshley Hulin, RN notified.      Baldo DaubJolan Chundra Sauerwein, MSW, LCSWA Clinical Social Worker (Disposition) [facility]  [phone]

## 2017-05-03 NOTE — ED Notes (Addendum)
Called Steven [name] at [facility] for disposition of pt,  Recommended inpt care,  Will be admitted as soon as a room is found

## 2017-06-11 ENCOUNTER — Emergency Department (HOSPITAL_COMMUNITY): Payer: Self-pay

## 2017-06-11 ENCOUNTER — Encounter (HOSPITAL_COMMUNITY): Payer: Self-pay | Admitting: Emergency Medicine

## 2017-06-11 ENCOUNTER — Emergency Department (HOSPITAL_COMMUNITY)
Admission: EM | Admit: 2017-06-11 | Discharge: 2017-06-11 | Disposition: A | Discharge status: Home / Self Care | Payer: Self-pay | Attending: Emergency Medicine | Admitting: Emergency Medicine

## 2017-06-11 DIAGNOSIS — F17210 Nicotine dependence, cigarettes, uncomplicated: Secondary | ICD-10-CM | POA: Insufficient documentation

## 2017-06-11 DIAGNOSIS — I10 Essential (primary) hypertension: Secondary | ICD-10-CM | POA: Insufficient documentation

## 2017-06-11 DIAGNOSIS — J449 Chronic obstructive pulmonary disease, unspecified: Secondary | ICD-10-CM | POA: Insufficient documentation

## 2017-06-11 DIAGNOSIS — Z79899 Other long term (current) drug therapy: Secondary | ICD-10-CM | POA: Insufficient documentation

## 2017-06-11 DIAGNOSIS — Z7984 Long term (current) use of oral hypoglycemic drugs: Secondary | ICD-10-CM | POA: Insufficient documentation

## 2017-06-11 DIAGNOSIS — I2510 Atherosclerotic heart disease of native coronary artery without angina pectoris: Secondary | ICD-10-CM | POA: Insufficient documentation

## 2017-06-11 DIAGNOSIS — J45909 Unspecified asthma, uncomplicated: Secondary | ICD-10-CM | POA: Insufficient documentation

## 2017-06-11 DIAGNOSIS — I252 Old myocardial infarction: Secondary | ICD-10-CM | POA: Insufficient documentation

## 2017-06-11 DIAGNOSIS — K439 Ventral hernia without obstruction or gangrene: Secondary | ICD-10-CM | POA: Insufficient documentation

## 2017-06-11 DIAGNOSIS — Z951 Presence of aortocoronary bypass graft: Secondary | ICD-10-CM | POA: Insufficient documentation

## 2017-06-11 DIAGNOSIS — K529 Noninfective gastroenteritis and colitis, unspecified: Secondary | ICD-10-CM | POA: Insufficient documentation

## 2017-06-11 LAB — URINALYSIS, ROUTINE W REFLEX MICROSCOPIC
BACTERIA UA: NONE SEEN
BILIRUBIN URINE: NEGATIVE
GLUCOSE, UA: NEGATIVE mg/dL
HGB URINE DIPSTICK: NEGATIVE
Ketones, ur: NEGATIVE mg/dL
LEUKOCYTES UA: NEGATIVE
Nitrite: NEGATIVE
PH: 6 (ref 5.0–8.0)
Protein, ur: 30 mg/dL — AB
Specific Gravity, Urine: 1.023 (ref 1.005–1.030)

## 2017-06-11 LAB — COMPREHENSIVE METABOLIC PANEL
ALBUMIN: 3.4 g/dL — AB (ref 3.5–5.0)
ALK PHOS: 90 U/L (ref 38–126)
ALT: 28 U/L (ref 17–63)
ANION GAP: 9 (ref 5–15)
AST: 34 U/L (ref 15–41)
BILIRUBIN TOTAL: 0.2 mg/dL — AB (ref 0.3–1.2)
BUN: 12 mg/dL (ref 6–20)
CALCIUM: 8.9 mg/dL (ref 8.9–10.3)
CO2: 25 mmol/L (ref 22–32)
Chloride: 105 mmol/L (ref 101–111)
Creatinine, Ser: 0.94 mg/dL (ref 0.61–1.24)
GFR calc non Af Amer: >60 mL/min (ref >60)
GLUCOSE: 162 mg/dL — AB (ref 65–99)
POTASSIUM: 3.1 mmol/L — AB (ref 3.5–5.1)
SODIUM: 139 mmol/L (ref 135–145)
TOTAL PROTEIN: 6.6 g/dL (ref 6.5–8.1)

## 2017-06-11 LAB — CBC
HEMATOCRIT: 41.2 % (ref 39.0–52.0)
HEMOGLOBIN: 13.9 g/dL (ref 13.0–17.0)
MCH: 31.3 pg (ref 26.0–34.0)
MCHC: 33.7 g/dL (ref 30.0–36.0)
MCV: 92.8 fL (ref 78.0–100.0)
Platelets: 207 10*3/uL (ref 150–400)
RBC: 4.44 MIL/uL (ref 4.22–5.81)
RDW: 15.3 % (ref 11.5–15.5)
WBC: 7.1 10*3/uL (ref 4.0–10.5)

## 2017-06-11 LAB — LIPASE, BLOOD: Lipase: 55 U/L — ABNORMAL HIGH (ref 11–51)

## 2017-06-11 MED ORDER — IOPAMIDOL (ISOVUE-300) INJECTION 61%
INTRAVENOUS | Status: AC
  Administered 2017-06-11: 100 mL via INTRAVENOUS
  Filled 2017-06-11: qty 100

## 2017-06-11 MED ORDER — KETOROLAC TROMETHAMINE 15 MG/ML IJ SOLN
15.0000 mg | Freq: Once | INTRAMUSCULAR | Status: AC
  Administered 2017-06-11: 15 mg via INTRAVENOUS
  Filled 2017-06-11: qty 1

## 2017-06-11 MED ORDER — DICYCLOMINE HCL 10 MG/ML IM SOLN
20.0000 mg | Freq: Once | INTRAMUSCULAR | Status: AC
  Administered 2017-06-11: 20 mg via INTRAMUSCULAR
  Filled 2017-06-11: qty 2

## 2017-06-11 MED ORDER — ONDANSETRON HCL 4 MG/2ML IJ SOLN
4.0000 mg | Freq: Once | INTRAMUSCULAR | Status: AC
  Administered 2017-06-11: 4 mg via INTRAVENOUS
  Filled 2017-06-11: qty 2

## 2017-06-11 MED ORDER — MORPHINE SULFATE (PF) 4 MG/ML IV SOLN
4.0000 mg | Freq: Once | INTRAVENOUS | Status: AC
  Administered 2017-06-11: 4 mg via INTRAVENOUS
  Filled 2017-06-11: qty 1

## 2017-06-11 MED ORDER — SODIUM CHLORIDE 0.9 % IV BOLUS (SEPSIS)
1000.0000 mL | Freq: Once | INTRAVENOUS | Status: AC
  Administered 2017-06-11: 1000 mL via INTRAVENOUS

## 2017-06-11 MED ORDER — DICYCLOMINE HCL 20 MG PO TABS: 20.0000 mg | ORAL_TABLET | Freq: Two times a day (BID) | ORAL | 0 refills | Status: DC

## 2017-06-11 MED ORDER — IOPAMIDOL (ISOVUE-300) INJECTION 61%
100.0000 mL | Freq: Once | INTRAVENOUS | Status: AC | PRN
  Administered 2017-06-11: 100 mL via INTRAVENOUS

## 2017-06-11 NOTE — ED Notes (Addendum)
Pt states he wants to go, he states "I've been waiting here for 4-5 hours and my pain is still at a fucking ten. I want to go leave it's almost dinner time and I haven't had anything to eat today." MD Schloschman made aware

## 2017-06-11 NOTE — ED Triage Notes (Signed)
Patient c/o lower abdominal pain since pelvic surgery in September. Reports he has a "surgery induced inguinal hernia" from that procedure. Patient reports pain, decreased urination, and "pressure to left testicle for months."

## 2017-06-11 NOTE — ED Notes (Signed)
Pt refused to allow troponin level to be checked by blood draw
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
## 2017-06-11 NOTE — ED Notes (Signed)
Pt yelled at nurse in the hall stating "I am in a hospital! You would think when you come to the hospital that I would not have to be in such pain! You are not giving me what I need. I need a nicotine patch and pain medicine now." RN informed pt that he had been given what he was ordered by the doctor in a timely manner. Pt stated "Well that Doctor is being funny about the pain medicine. It isn't helping."

## 2017-06-11 NOTE — Progress Notes (Signed)
Spoke with pt and asked if it was ok to do the nursing admission history in case he was admitted. He states "I reckon. I need pain medication and they won't do shit." I put call light on and asked the secretary to let the nurse know that patient needs pain medication. I asked pt would valuables he is keeping with him if he is admitted. Pt states "We are wasting our time. I am going home." Unable to complete nursing admission history at this time on patient. Steven [name]. Carleane Ivone Licht BSN, RN-BC Admissions RN 06/11/2017 3:40 PM

## 2017-06-11 NOTE — ED Notes (Signed)
Attempted to perform EKG on patient and he refused  RN Tresa EndoKelly was a witness too patient refusing EKG.

## 2017-06-11 NOTE — ED Notes (Signed)
RN had a lengthy discussion with pt regarding trying to prevent him from using illicit drugs for pain management. Pt reports we did not take care of him as he wanted and he is still in excruciating pain.

## 2017-09-17 ENCOUNTER — Emergency Department (HOSPITAL_COMMUNITY)
Admission: EM | Admit: 2017-09-17 | Discharge: 2017-09-17 | Discharge status: Against Medical Advice | Payer: No Typology Code available for payment source | Attending: Emergency Medicine | Admitting: Emergency Medicine

## 2017-09-17 ENCOUNTER — Encounter (HOSPITAL_COMMUNITY): Payer: Self-pay | Admitting: Emergency Medicine

## 2017-09-17 ENCOUNTER — Other Ambulatory Visit: Payer: Self-pay

## 2017-09-17 DIAGNOSIS — I10 Essential (primary) hypertension: Secondary | ICD-10-CM | POA: Insufficient documentation

## 2017-09-17 DIAGNOSIS — J449 Chronic obstructive pulmonary disease, unspecified: Secondary | ICD-10-CM | POA: Diagnosis not present

## 2017-09-17 DIAGNOSIS — F10920 Alcohol use, unspecified with intoxication, uncomplicated: Secondary | ICD-10-CM

## 2017-09-17 DIAGNOSIS — Y999 Unspecified external cause status: Secondary | ICD-10-CM | POA: Insufficient documentation

## 2017-09-17 DIAGNOSIS — Y92410 Unspecified street and highway as the place of occurrence of the external cause: Secondary | ICD-10-CM | POA: Diagnosis not present

## 2017-09-17 DIAGNOSIS — F17210 Nicotine dependence, cigarettes, uncomplicated: Secondary | ICD-10-CM | POA: Insufficient documentation

## 2017-09-17 DIAGNOSIS — Z79899 Other long term (current) drug therapy: Secondary | ICD-10-CM | POA: Diagnosis not present

## 2017-09-17 DIAGNOSIS — F10929 Alcohol use, unspecified with intoxication, unspecified: Secondary | ICD-10-CM | POA: Diagnosis present

## 2017-09-17 DIAGNOSIS — Y9389 Activity, other specified: Secondary | ICD-10-CM | POA: Insufficient documentation

## 2017-09-17 LAB — CBC WITH DIFFERENTIAL/PLATELET
BASOS PCT: 0 %
Basophils Absolute: 0 10*3/uL (ref 0.0–0.1)
EOS ABS: 0.2 10*3/uL (ref 0.0–0.7)
Eosinophils Relative: 3 %
HCT: 39.1 % (ref 39.0–52.0)
Hemoglobin: 13.3 g/dL (ref 13.0–17.0)
Lymphocytes Relative: 26 %
Lymphs Abs: 1.2 10*3/uL (ref 0.7–4.0)
MCH: 33.3 pg (ref 26.0–34.0)
MCHC: 34 g/dL (ref 30.0–36.0)
MCV: 98 fL (ref 78.0–100.0)
MONO ABS: 0.5 10*3/uL (ref 0.1–1.0)
MONOS PCT: 10 %
Neutro Abs: 2.9 10*3/uL (ref 1.7–7.7)
Neutrophils Relative %: 61 %
Platelets: 231 10*3/uL (ref 150–400)
RBC: 3.99 MIL/uL — ABNORMAL LOW (ref 4.22–5.81)
RDW: 13.5 % (ref 11.5–15.5)
WBC: 4.8 10*3/uL (ref 4.0–10.5)

## 2017-09-17 LAB — COMPREHENSIVE METABOLIC PANEL
ALT: 23 U/L (ref 17–63)
AST: 27 U/L (ref 15–41)
Albumin: 3.5 g/dL (ref 3.5–5.0)
Alkaline Phosphatase: 73 U/L (ref 38–126)
Anion gap: 14 (ref 5–15)
BILIRUBIN TOTAL: 0.6 mg/dL (ref 0.3–1.2)
BUN: 15 mg/dL (ref 6–20)
CALCIUM: 8.9 mg/dL (ref 8.9–10.3)
CO2: 21 mmol/L — AB (ref 22–32)
CREATININE: 1.03 mg/dL (ref 0.61–1.24)
Chloride: 103 mmol/L (ref 101–111)
GFR calc Af Amer: >60 mL/min (ref >60)
GFR calc non Af Amer: >60 mL/min (ref >60)
GLUCOSE: 91 mg/dL (ref 65–99)
Potassium: 3.6 mmol/L (ref 3.5–5.1)
Sodium: 138 mmol/L (ref 135–145)
Total Protein: 6.2 g/dL — ABNORMAL LOW (ref 6.5–8.1)

## 2017-09-17 LAB — ETHANOL: Alcohol, Ethyl (B): 283 mg/dL — ABNORMAL HIGH (ref <10)
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
## 2017-12-06 ENCOUNTER — Emergency Department (HOSPITAL_COMMUNITY): Payer: Self-pay

## 2017-12-06 ENCOUNTER — Encounter (HOSPITAL_COMMUNITY): Payer: Self-pay | Admitting: Emergency Medicine

## 2017-12-06 ENCOUNTER — Emergency Department (HOSPITAL_COMMUNITY)
Admission: EM | Admit: 2017-12-06 | Discharge: 2017-12-06 | Disposition: A | Discharge status: Home / Self Care | Payer: Self-pay | Attending: Emergency Medicine | Admitting: Emergency Medicine

## 2017-12-06 ENCOUNTER — Other Ambulatory Visit: Payer: Self-pay

## 2017-12-06 DIAGNOSIS — J441 Chronic obstructive pulmonary disease with (acute) exacerbation: Secondary | ICD-10-CM | POA: Insufficient documentation

## 2017-12-06 DIAGNOSIS — R0781 Pleurodynia: Secondary | ICD-10-CM | POA: Insufficient documentation

## 2017-12-06 LAB — CBC WITH DIFFERENTIAL/PLATELET
Basophils Absolute: 0 10*3/uL (ref 0.0–0.1)
Basophils Relative: 1 %
EOS ABS: 0.1 10*3/uL (ref 0.0–0.7)
EOS PCT: 2 %
HCT: 42.6 % (ref 39.0–52.0)
Hemoglobin: 14.6 g/dL (ref 13.0–17.0)
LYMPHS ABS: 0.8 10*3/uL (ref 0.7–4.0)
Lymphocytes Relative: 13 %
MCH: 33.6 pg (ref 26.0–34.0)
MCHC: 34.3 g/dL (ref 30.0–36.0)
MCV: 98.2 fL (ref 78.0–100.0)
MONOS PCT: 9 %
Monocytes Absolute: 0.6 10*3/uL (ref 0.1–1.0)
Neutro Abs: 5 10*3/uL (ref 1.7–7.7)
Neutrophils Relative %: 75 %
PLATELETS: 363 10*3/uL (ref 150–400)
RBC: 4.34 MIL/uL (ref 4.22–5.81)
RDW: 13.8 % (ref 11.5–15.5)
WBC: 6.6 10*3/uL (ref 4.0–10.5)

## 2017-12-06 LAB — COMPREHENSIVE METABOLIC PANEL
ALT: 20 U/L (ref 0–44)
ANION GAP: 12 (ref 5–15)
AST: 25 U/L (ref 15–41)
Albumin: 3.9 g/dL (ref 3.5–5.0)
Alkaline Phosphatase: 91 U/L (ref 38–126)
BUN: 13 mg/dL (ref 6–20)
CALCIUM: 9.9 mg/dL (ref 8.9–10.3)
CHLORIDE: 103 mmol/L (ref 98–111)
CO2: 23 mmol/L (ref 22–32)
Creatinine, Ser: 0.94 mg/dL (ref 0.61–1.24)
GFR calc non Af Amer: >60 mL/min (ref >60)
Glucose, Bld: 143 mg/dL — ABNORMAL HIGH (ref 70–99)
Potassium: 4 mmol/L (ref 3.5–5.1)
SODIUM: 138 mmol/L (ref 135–145)
Total Bilirubin: 0.6 mg/dL (ref 0.3–1.2)
Total Protein: 7.8 g/dL (ref 6.5–8.1)

## 2017-12-06 MED ORDER — ALBUTEROL SULFATE HFA 108 (90 BASE) MCG/ACT IN AERS
2.0000 | INHALATION_SPRAY | RESPIRATORY_TRACT | Status: DC | PRN
  Administered 2017-12-06: 2 via RESPIRATORY_TRACT
  Filled 2017-12-06: qty 6.7

## 2017-12-06 MED ORDER — ALBUTEROL SULFATE (2.5 MG/3ML) 0.083% IN NEBU
2.5000 mg | INHALATION_SOLUTION | Freq: Once | RESPIRATORY_TRACT | Status: AC
  Administered 2017-12-06: 2.5 mg via RESPIRATORY_TRACT
  Filled 2017-12-06: qty 3

## 2017-12-06 MED ORDER — ONDANSETRON 4 MG PO TBDP: ORAL_TABLET | ORAL | 0 refills | Status: AC

## 2017-12-06 MED ORDER — IPRATROPIUM-ALBUTEROL 0.5-2.5 (3) MG/3ML IN SOLN
3.0000 mL | Freq: Once | RESPIRATORY_TRACT | Status: AC
  Administered 2017-12-06: 3 mL via RESPIRATORY_TRACT
  Filled 2017-12-06: qty 3

## 2017-12-06 MED ORDER — METHYLPREDNISOLONE SODIUM SUCC 125 MG IJ SOLR
125.0000 mg | Freq: Once | INTRAMUSCULAR | Status: AC
  Administered 2017-12-06: 125 mg via INTRAVENOUS
  Filled 2017-12-06: qty 2

## 2017-12-06 MED ORDER — OXYCODONE-ACETAMINOPHEN 10-325 MG PO TABS: 1.0000 | ORAL_TABLET | Freq: Four times a day (QID) | ORAL | 0 refills | Status: AC | PRN

## 2017-12-06 MED ORDER — HYDROMORPHONE HCL 1 MG/ML IJ SOLN
0.5000 mg | Freq: Once | INTRAMUSCULAR | Status: AC
  Administered 2017-12-06: 0.5 mg via INTRAVENOUS
  Filled 2017-12-06: qty 1

## 2017-12-06 MED ORDER — ONDANSETRON HCL 4 MG/2ML IJ SOLN
4.0000 mg | Freq: Once | INTRAMUSCULAR | Status: AC
  Administered 2017-12-06: 4 mg via INTRAVENOUS
  Filled 2017-12-06: qty 2

## 2017-12-06 MED ORDER — HYDROMORPHONE HCL 1 MG/ML IJ SOLN
1.0000 mg | Freq: Once | INTRAMUSCULAR | Status: AC
  Administered 2017-12-06: 1 mg via INTRAVENOUS
  Filled 2017-12-06: qty 1

## 2017-12-06 MED ORDER — DOXYCYCLINE HYCLATE 100 MG PO CAPS: 100.0000 mg | ORAL_CAPSULE | Freq: Two times a day (BID) | ORAL | 0 refills | Status: AC

## 2017-12-06 NOTE — ED Notes (Signed)
Patient taken off 3LNC and placed on RA at 1300. Patient tolerated well. Oxygen saturations have remained above 92% on RA.
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
## 2017-12-06 NOTE — ED Notes (Signed)
Patient transported to x-ray. ?

## 2017-12-06 NOTE — Discharge Instructions (Addendum)
Follow-up next week with your doctor if not improving
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
## 2019-09-13 DEATH — deceased
